# Patient Record
Sex: Female | Born: 1935 | Race: White | Hispanic: No | Smoking: Former smoker
Health system: Southern US, Community
[De-identification: ages and names within clinical notes are randomized; demographics above are authoritative.]

## PROBLEM LIST (undated history)

## (undated) DIAGNOSIS — I6350 Cerebral infarction due to unspecified occlusion or stenosis of unspecified cerebral artery: Secondary | ICD-10-CM

## (undated) DIAGNOSIS — R911 Solitary pulmonary nodule: Secondary | ICD-10-CM

## (undated) DIAGNOSIS — M545 Low back pain, unspecified: Secondary | ICD-10-CM

## (undated) DIAGNOSIS — H3530 Unspecified macular degeneration: Secondary | ICD-10-CM

## (undated) DIAGNOSIS — J852 Abscess of lung without pneumonia: Secondary | ICD-10-CM

## (undated) DIAGNOSIS — M1990 Unspecified osteoarthritis, unspecified site: Secondary | ICD-10-CM

## (undated) DIAGNOSIS — K5730 Diverticulosis of large intestine without perforation or abscess without bleeding: Secondary | ICD-10-CM

## (undated) DIAGNOSIS — E039 Hypothyroidism, unspecified: Secondary | ICD-10-CM

## (undated) DIAGNOSIS — M461 Sacroiliitis, not elsewhere classified: Principal | ICD-10-CM

## (undated) DIAGNOSIS — B54 Unspecified malaria: Secondary | ICD-10-CM

## (undated) DIAGNOSIS — J189 Pneumonia, unspecified organism: Secondary | ICD-10-CM

## (undated) DIAGNOSIS — I10 Essential (primary) hypertension: Secondary | ICD-10-CM

## (undated) DIAGNOSIS — I350 Nonrheumatic aortic (valve) stenosis: Secondary | ICD-10-CM

## (undated) DIAGNOSIS — K315 Obstruction of duodenum: Secondary | ICD-10-CM

## (undated) DIAGNOSIS — F0390 Unspecified dementia without behavioral disturbance: Secondary | ICD-10-CM

## (undated) DIAGNOSIS — K630 Abscess of intestine: Secondary | ICD-10-CM

## (undated) DIAGNOSIS — D649 Anemia, unspecified: Secondary | ICD-10-CM

## (undated) DIAGNOSIS — R740 Nonspecific elevation of levels of transaminase and lactic acid dehydrogenase [LDH]: Secondary | ICD-10-CM

## (undated) DIAGNOSIS — F329 Major depressive disorder, single episode, unspecified: Secondary | ICD-10-CM

## (undated) DIAGNOSIS — R569 Unspecified convulsions: Secondary | ICD-10-CM

## (undated) DIAGNOSIS — K449 Diaphragmatic hernia without obstruction or gangrene: Secondary | ICD-10-CM

## (undated) DIAGNOSIS — I499 Cardiac arrhythmia, unspecified: Secondary | ICD-10-CM

## (undated) DIAGNOSIS — Z7901 Long term (current) use of anticoagulants: Secondary | ICD-10-CM

## (undated) DIAGNOSIS — K625 Hemorrhage of anus and rectum: Secondary | ICD-10-CM

## (undated) DIAGNOSIS — I509 Heart failure, unspecified: Secondary | ICD-10-CM

## (undated) DIAGNOSIS — I6529 Occlusion and stenosis of unspecified carotid artery: Secondary | ICD-10-CM

## (undated) DIAGNOSIS — F419 Anxiety disorder, unspecified: Secondary | ICD-10-CM

## (undated) DIAGNOSIS — J310 Chronic rhinitis: Secondary | ICD-10-CM

## (undated) DIAGNOSIS — D509 Iron deficiency anemia, unspecified: Secondary | ICD-10-CM

## (undated) DIAGNOSIS — K219 Gastro-esophageal reflux disease without esophagitis: Secondary | ICD-10-CM

## (undated) DIAGNOSIS — R202 Paresthesia of skin: Secondary | ICD-10-CM

## (undated) DIAGNOSIS — J449 Chronic obstructive pulmonary disease, unspecified: Secondary | ICD-10-CM

## (undated) DIAGNOSIS — M109 Gout, unspecified: Secondary | ICD-10-CM

## (undated) DIAGNOSIS — D249 Benign neoplasm of unspecified breast: Secondary | ICD-10-CM

## (undated) DIAGNOSIS — IMO0002 Reserved for concepts with insufficient information to code with codable children: Secondary | ICD-10-CM

## (undated) DIAGNOSIS — G8929 Other chronic pain: Secondary | ICD-10-CM

## (undated) DIAGNOSIS — N289 Disorder of kidney and ureter, unspecified: Secondary | ICD-10-CM

## (undated) DIAGNOSIS — K261 Acute duodenal ulcer with perforation: Secondary | ICD-10-CM

## (undated) DIAGNOSIS — C4490 Unspecified malignant neoplasm of skin, unspecified: Secondary | ICD-10-CM

## (undated) DIAGNOSIS — G629 Polyneuropathy, unspecified: Secondary | ICD-10-CM

## (undated) DIAGNOSIS — L408 Other psoriasis: Secondary | ICD-10-CM

## (undated) DIAGNOSIS — M47817 Spondylosis without myelopathy or radiculopathy, lumbosacral region: Secondary | ICD-10-CM

## (undated) DIAGNOSIS — I776 Arteritis, unspecified: Secondary | ICD-10-CM

## (undated) DIAGNOSIS — K8020 Calculus of gallbladder without cholecystitis without obstruction: Secondary | ICD-10-CM

## (undated) DIAGNOSIS — H543 Unqualified visual loss, both eyes: Secondary | ICD-10-CM

## (undated) DIAGNOSIS — F32A Depression, unspecified: Secondary | ICD-10-CM

## (undated) DIAGNOSIS — R7401 Elevation of levels of liver transaminase levels: Secondary | ICD-10-CM

## (undated) DIAGNOSIS — E785 Hyperlipidemia, unspecified: Secondary | ICD-10-CM

## (undated) DIAGNOSIS — K648 Other hemorrhoids: Secondary | ICD-10-CM

## (undated) DIAGNOSIS — Z8673 Personal history of transient ischemic attack (TIA), and cerebral infarction without residual deficits: Secondary | ICD-10-CM

## (undated) DIAGNOSIS — A0472 Enterocolitis due to Clostridium difficile, not specified as recurrent: Secondary | ICD-10-CM

## (undated) HISTORY — DX: Other psoriasis: L40.8

## (undated) HISTORY — DX: Low back pain: M54.5

## (undated) HISTORY — DX: Hypothyroidism, unspecified: E03.9

## (undated) HISTORY — DX: Diaphragmatic hernia without obstruction or gangrene: K44.9

## (undated) HISTORY — DX: Calculus of gallbladder without cholecystitis without obstruction: K80.20

## (undated) HISTORY — DX: Depression, unspecified: F32.A

## (undated) HISTORY — DX: Gout, unspecified: M10.9

## (undated) HISTORY — DX: Low back pain, unspecified: M54.50

## (undated) HISTORY — DX: Nonspecific elevation of levels of transaminase and lactic acid dehydrogenase (ldh): R74.0

## (undated) HISTORY — DX: Spondylosis without myelopathy or radiculopathy, lumbosacral region: M47.817

## (undated) HISTORY — DX: Hyperlipidemia, unspecified: E78.5

## (undated) HISTORY — DX: Unspecified dementia, unspecified severity, without behavioral disturbance, psychotic disturbance, mood disturbance, and anxiety: F03.90

## (undated) HISTORY — DX: Abscess of lung without pneumonia: J85.2

## (undated) HISTORY — DX: Major depressive disorder, single episode, unspecified: F32.9

## (undated) HISTORY — DX: Chronic rhinitis: J31.0

## (undated) HISTORY — DX: Acute duodenal ulcer with perforation: K26.1

## (undated) HISTORY — DX: Abscess of intestine: K63.0

## (undated) HISTORY — DX: Diverticulosis of large intestine without perforation or abscess without bleeding: K57.30

## (undated) HISTORY — DX: Heart failure, unspecified: I50.9

## (undated) HISTORY — DX: Elevation of levels of liver transaminase levels: R74.01

## (undated) HISTORY — DX: Essential (primary) hypertension: I10

## (undated) HISTORY — PX: BREAST LUMPECTOMY: SHX2

## (undated) HISTORY — PX: CATARACT EXTRACTION W/ INTRAOCULAR LENS  IMPLANT, BILATERAL: SHX1307

## (undated) HISTORY — DX: Unspecified osteoarthritis, unspecified site: M19.90

## (undated) HISTORY — DX: Personal history of transient ischemic attack (TIA), and cerebral infarction without residual deficits: Z86.73

## (undated) HISTORY — DX: Reserved for concepts with insufficient information to code with codable children: IMO0002

## (undated) HISTORY — DX: Iron deficiency anemia, unspecified: D50.9

## (undated) HISTORY — PX: TONSILLECTOMY: SUR1361

## (undated) HISTORY — PX: SKIN CANCER EXCISION: SHX779

## (undated) HISTORY — DX: Enterocolitis due to Clostridium difficile, not specified as recurrent: A04.72

## (undated) HISTORY — PX: VESICOVAGINAL FISTULA CLOSURE W/ TAH: SUR271

## (undated) HISTORY — DX: Chronic obstructive pulmonary disease, unspecified: J44.9

## (undated) HISTORY — DX: Paresthesia of skin: R20.2

## (undated) HISTORY — DX: Sacroiliitis, not elsewhere classified: M46.1

## (undated) HISTORY — PX: ABDOMINAL HYSTERECTOMY: SHX81

## (undated) HISTORY — DX: Nonrheumatic aortic (valve) stenosis: I35.0

## (undated) HISTORY — DX: Benign neoplasm of unspecified breast: D24.9

## (undated) HISTORY — DX: Long term (current) use of anticoagulants: Z79.01

## (undated) HISTORY — DX: Hemorrhage of anus and rectum: K62.5

## (undated) HISTORY — DX: Anemia, unspecified: D64.9

## (undated) HISTORY — DX: Anxiety disorder, unspecified: F41.9

## (undated) HISTORY — DX: Obstruction of duodenum: K31.5

## (undated) HISTORY — DX: Solitary pulmonary nodule: R91.1

---

## 1992-11-09 ENCOUNTER — Encounter (INDEPENDENT_AMBULATORY_CARE_PROVIDER_SITE_OTHER): Payer: Self-pay | Admitting: *Deleted

## 1999-03-02 ENCOUNTER — Ambulatory Visit (HOSPITAL_COMMUNITY): Admission: RE | Admit: 1999-03-02 | Discharge: 1999-03-02 | Payer: Self-pay | Admitting: *Deleted

## 1999-04-25 ENCOUNTER — Encounter: Payer: Self-pay | Admitting: General Surgery

## 1999-04-25 ENCOUNTER — Encounter: Admission: RE | Admit: 1999-04-25 | Discharge: 1999-04-25 | Payer: Self-pay | Admitting: General Surgery

## 1999-04-26 ENCOUNTER — Encounter (INDEPENDENT_AMBULATORY_CARE_PROVIDER_SITE_OTHER): Payer: Self-pay | Admitting: Specialist

## 1999-04-26 ENCOUNTER — Ambulatory Visit (HOSPITAL_BASED_OUTPATIENT_CLINIC_OR_DEPARTMENT_OTHER): Admission: RE | Admit: 1999-04-26 | Discharge: 1999-04-26 | Payer: Self-pay | Admitting: General Surgery

## 1999-05-09 ENCOUNTER — Ambulatory Visit (HOSPITAL_BASED_OUTPATIENT_CLINIC_OR_DEPARTMENT_OTHER): Admission: RE | Admit: 1999-05-09 | Discharge: 1999-05-09 | Payer: Self-pay | Admitting: Plastic Surgery

## 1999-11-23 ENCOUNTER — Ambulatory Visit (HOSPITAL_COMMUNITY): Admission: RE | Admit: 1999-11-23 | Discharge: 1999-11-23 | Payer: Self-pay | Admitting: Neurology

## 1999-12-29 ENCOUNTER — Other Ambulatory Visit: Admission: RE | Admit: 1999-12-29 | Discharge: 1999-12-29 | Payer: Self-pay | Admitting: Internal Medicine

## 2000-04-27 ENCOUNTER — Emergency Department (HOSPITAL_COMMUNITY): Admission: EM | Admit: 2000-04-27 | Discharge: 2000-04-27 | Payer: Self-pay | Admitting: Emergency Medicine

## 2000-09-24 ENCOUNTER — Emergency Department (HOSPITAL_COMMUNITY): Admission: EM | Admit: 2000-09-24 | Discharge: 2000-09-25 | Payer: Self-pay | Admitting: Emergency Medicine

## 2000-09-24 ENCOUNTER — Encounter: Payer: Self-pay | Admitting: Emergency Medicine

## 2000-11-21 ENCOUNTER — Inpatient Hospital Stay (HOSPITAL_COMMUNITY): Admission: AD | Admit: 2000-11-21 | Discharge: 2000-11-24 | Payer: Self-pay | Admitting: Cardiology

## 2000-11-22 ENCOUNTER — Encounter: Payer: Self-pay | Admitting: Cardiology

## 2002-12-18 ENCOUNTER — Encounter: Payer: Self-pay | Admitting: Neurology

## 2002-12-18 ENCOUNTER — Encounter: Admission: RE | Admit: 2002-12-18 | Discharge: 2002-12-18 | Payer: Self-pay | Admitting: Neurology

## 2003-12-14 ENCOUNTER — Inpatient Hospital Stay (HOSPITAL_COMMUNITY): Admission: EM | Admit: 2003-12-14 | Discharge: 2003-12-16 | Payer: Self-pay | Admitting: Emergency Medicine

## 2003-12-15 ENCOUNTER — Encounter: Payer: Self-pay | Admitting: Gastroenterology

## 2005-02-20 ENCOUNTER — Ambulatory Visit: Payer: Self-pay | Admitting: Gastroenterology

## 2005-02-26 ENCOUNTER — Ambulatory Visit: Payer: Self-pay | Admitting: Internal Medicine

## 2005-03-07 ENCOUNTER — Encounter: Payer: Self-pay | Admitting: Cardiology

## 2005-03-07 ENCOUNTER — Ambulatory Visit: Payer: Self-pay

## 2005-03-09 ENCOUNTER — Ambulatory Visit: Payer: Self-pay | Admitting: Internal Medicine

## 2005-03-28 ENCOUNTER — Ambulatory Visit: Payer: Self-pay | Admitting: Internal Medicine

## 2005-03-28 ENCOUNTER — Ambulatory Visit: Payer: Self-pay | Admitting: Family Medicine

## 2005-04-05 ENCOUNTER — Ambulatory Visit: Payer: Self-pay | Admitting: Internal Medicine

## 2005-04-20 ENCOUNTER — Ambulatory Visit: Payer: Self-pay | Admitting: Internal Medicine

## 2005-05-18 ENCOUNTER — Ambulatory Visit: Payer: Self-pay | Admitting: Internal Medicine

## 2005-05-25 ENCOUNTER — Ambulatory Visit: Payer: Self-pay | Admitting: Internal Medicine

## 2005-06-18 ENCOUNTER — Ambulatory Visit: Payer: Self-pay | Admitting: Internal Medicine

## 2005-06-28 ENCOUNTER — Ambulatory Visit: Payer: Self-pay | Admitting: Internal Medicine

## 2005-07-05 ENCOUNTER — Ambulatory Visit: Payer: Self-pay | Admitting: Internal Medicine

## 2005-07-10 ENCOUNTER — Encounter: Admission: RE | Admit: 2005-07-10 | Discharge: 2005-08-12 | Payer: Self-pay | Admitting: Neurology

## 2005-07-13 ENCOUNTER — Ambulatory Visit: Payer: Self-pay | Admitting: Internal Medicine

## 2005-07-20 ENCOUNTER — Ambulatory Visit: Payer: Self-pay | Admitting: Internal Medicine

## 2005-08-02 ENCOUNTER — Ambulatory Visit: Payer: Self-pay | Admitting: Internal Medicine

## 2005-08-09 ENCOUNTER — Ambulatory Visit: Payer: Self-pay | Admitting: Internal Medicine

## 2005-08-13 ENCOUNTER — Encounter: Admission: RE | Admit: 2005-08-13 | Discharge: 2005-08-23 | Payer: Self-pay | Admitting: Neurology

## 2005-09-04 ENCOUNTER — Ambulatory Visit: Payer: Self-pay | Admitting: Internal Medicine

## 2005-09-19 ENCOUNTER — Ambulatory Visit: Payer: Self-pay | Admitting: Internal Medicine

## 2005-10-02 ENCOUNTER — Ambulatory Visit: Payer: Self-pay | Admitting: Internal Medicine

## 2005-10-25 ENCOUNTER — Ambulatory Visit: Payer: Self-pay | Admitting: Internal Medicine

## 2005-10-31 ENCOUNTER — Ambulatory Visit: Payer: Self-pay | Admitting: Internal Medicine

## 2005-11-13 ENCOUNTER — Ambulatory Visit: Payer: Self-pay | Admitting: Internal Medicine

## 2005-11-22 ENCOUNTER — Ambulatory Visit: Payer: Self-pay | Admitting: Internal Medicine

## 2005-11-23 ENCOUNTER — Ambulatory Visit: Payer: Self-pay | Admitting: Internal Medicine

## 2005-11-28 ENCOUNTER — Ambulatory Visit: Payer: Self-pay | Admitting: Internal Medicine

## 2005-12-05 ENCOUNTER — Ambulatory Visit: Payer: Self-pay | Admitting: Internal Medicine

## 2005-12-12 ENCOUNTER — Ambulatory Visit: Payer: Self-pay | Admitting: Internal Medicine

## 2005-12-18 ENCOUNTER — Ambulatory Visit: Payer: Self-pay

## 2006-01-08 ENCOUNTER — Ambulatory Visit: Payer: Self-pay | Admitting: Internal Medicine

## 2006-01-09 ENCOUNTER — Ambulatory Visit: Payer: Self-pay | Admitting: Internal Medicine

## 2006-01-23 ENCOUNTER — Ambulatory Visit: Payer: Self-pay | Admitting: Internal Medicine

## 2006-01-29 ENCOUNTER — Ambulatory Visit: Payer: Self-pay

## 2006-02-04 ENCOUNTER — Ambulatory Visit: Payer: Self-pay | Admitting: Internal Medicine

## 2006-02-05 ENCOUNTER — Ambulatory Visit: Payer: Self-pay | Admitting: Gastroenterology

## 2006-02-05 LAB — CONVERTED CEMR LAB
ALT: 32 units/L (ref 0–40)
AST: 41 units/L — ABNORMAL HIGH (ref 0–37)
Alkaline Phosphatase: 64 units/L (ref 39–117)
Basophils Absolute: 0.1 10*3/uL (ref 0.0–0.1)
Chloride: 103 meq/L (ref 96–112)
Glucose, Bld: 92 mg/dL (ref 70–99)
HCT: 40.9 % (ref 36.0–46.0)
Lymphocytes Relative: 34.5 % (ref 12.0–46.0)
MCHC: 32.3 g/dL (ref 30.0–36.0)
MCV: 92 fL (ref 78.0–100.0)
Monocytes Relative: 8 % (ref 3.0–11.0)
Neutro Abs: 3.9 10*3/uL (ref 1.4–7.7)
Neutrophils Relative %: 54 % (ref 43.0–77.0)
Saturation Ratios: 9.6 % — ABNORMAL LOW (ref 20.0–50.0)
Sodium: 139 meq/L (ref 135–145)
TSH: 1.23 microintl units/mL (ref 0.35–5.50)
Total Protein: 7.5 g/dL (ref 6.0–8.3)
Transferrin: 363.9 mg/dL — ABNORMAL HIGH (ref >=212.0)
Vitamin B-12: >1500 pg/mL — ABNORMAL HIGH (ref 211–911)
WBC: 7.3 10*3/uL (ref 4.5–10.5)

## 2006-02-13 ENCOUNTER — Ambulatory Visit: Payer: Self-pay | Admitting: Internal Medicine

## 2006-02-28 ENCOUNTER — Ambulatory Visit: Payer: Self-pay | Admitting: Gastroenterology

## 2006-03-12 ENCOUNTER — Ambulatory Visit: Payer: Self-pay | Admitting: Internal Medicine

## 2006-03-27 ENCOUNTER — Ambulatory Visit: Payer: Self-pay | Admitting: Gastroenterology

## 2006-04-09 ENCOUNTER — Ambulatory Visit: Payer: Self-pay | Admitting: Internal Medicine

## 2006-04-09 LAB — CONVERTED CEMR LAB
ALT: 30 units/L (ref 0–40)
AST: 42 units/L — ABNORMAL HIGH (ref 0–37)
Albumin: 3.6 g/dL (ref 3.5–5.2)
Alkaline Phosphatase: 61 units/L (ref 39–117)
BUN: 18 mg/dL (ref 6–23)
Bilirubin, Direct: 0.1 mg/dL (ref 0.0–0.3)
CO2: 27 meq/L (ref 19–32)
Calcium: 9.1 mg/dL (ref 8.4–10.5)
Chloride: 104 meq/L (ref 96–112)
Cholesterol: 219 mg/dL (ref 0–200)
Creatinine, Ser: 1.1 mg/dL (ref 0.4–1.2)
Direct LDL: 142.9 mg/dL
GFR calc Af Amer: 61 mL/min
GFR calc non Af Amer: 50 mL/min
Glucose, Bld: 80 mg/dL (ref 70–99)
HDL: 39.8 mg/dL (ref >39.0)
Potassium: 4.8 meq/L (ref 3.5–5.1)
Sodium: 138 meq/L (ref 135–145)
Total Bilirubin: 1.2 mg/dL (ref 0.3–1.2)
Total CHOL/HDL Ratio: 5.5
Total Protein: 6.7 g/dL (ref 6.0–8.3)
Triglycerides: 230 mg/dL (ref 0–149)
VLDL: 46 mg/dL — ABNORMAL HIGH (ref 0–40)

## 2006-04-17 ENCOUNTER — Encounter: Admission: RE | Admit: 2006-04-17 | Discharge: 2006-04-17 | Payer: Self-pay | Admitting: Internal Medicine

## 2006-05-14 ENCOUNTER — Ambulatory Visit: Payer: Self-pay | Admitting: Internal Medicine

## 2006-05-21 ENCOUNTER — Ambulatory Visit: Payer: Self-pay | Admitting: Internal Medicine

## 2006-05-27 ENCOUNTER — Ambulatory Visit: Payer: Self-pay | Admitting: Internal Medicine

## 2006-05-27 ENCOUNTER — Encounter: Admission: RE | Admit: 2006-05-27 | Discharge: 2006-05-27 | Payer: Self-pay | Admitting: Internal Medicine

## 2006-06-03 ENCOUNTER — Ambulatory Visit: Payer: Self-pay | Admitting: Internal Medicine

## 2006-06-10 ENCOUNTER — Ambulatory Visit: Payer: Self-pay | Admitting: Internal Medicine

## 2006-06-10 ENCOUNTER — Encounter: Admission: RE | Admit: 2006-06-10 | Discharge: 2006-06-10 | Payer: Self-pay | Admitting: Internal Medicine

## 2006-06-11 ENCOUNTER — Encounter: Payer: Self-pay | Admitting: Internal Medicine

## 2006-06-24 ENCOUNTER — Ambulatory Visit: Payer: Self-pay | Admitting: Internal Medicine

## 2006-06-26 ENCOUNTER — Ambulatory Visit: Payer: Self-pay | Admitting: Internal Medicine

## 2006-07-08 ENCOUNTER — Ambulatory Visit: Payer: Self-pay | Admitting: Internal Medicine

## 2006-07-22 ENCOUNTER — Ambulatory Visit: Payer: Self-pay | Admitting: Internal Medicine

## 2006-07-22 LAB — CONVERTED CEMR LAB
ALT: 26 units/L (ref 0–40)
AST: 35 units/L (ref 0–37)
Albumin: 3.2 g/dL — ABNORMAL LOW (ref 3.5–5.2)
Alkaline Phosphatase: 65 units/L (ref 39–117)
Bilirubin, Direct: 0.1 mg/dL (ref 0.0–0.3)
Cholesterol: 234 mg/dL (ref 0–200)
Direct LDL: 147.6 mg/dL
HDL: 37.5 mg/dL — ABNORMAL LOW (ref >39.0)
Total Bilirubin: 0.8 mg/dL (ref 0.3–1.2)
Total CHOL/HDL Ratio: 6.2
Total Protein: 7.2 g/dL (ref 6.0–8.3)
Triglycerides: 280 mg/dL (ref 0–149)
VLDL: 56 mg/dL — ABNORMAL HIGH (ref 0–40)

## 2006-08-20 ENCOUNTER — Ambulatory Visit: Payer: Self-pay | Admitting: Internal Medicine

## 2006-08-27 ENCOUNTER — Ambulatory Visit: Payer: Self-pay | Admitting: Internal Medicine

## 2006-09-11 ENCOUNTER — Ambulatory Visit: Payer: Self-pay | Admitting: Internal Medicine

## 2006-09-13 ENCOUNTER — Ambulatory Visit: Payer: Self-pay | Admitting: Pulmonary Disease

## 2006-09-13 ENCOUNTER — Inpatient Hospital Stay (HOSPITAL_COMMUNITY): Admission: EM | Admit: 2006-09-13 | Discharge: 2006-09-20 | Payer: Self-pay | Admitting: Emergency Medicine

## 2006-09-13 DIAGNOSIS — I509 Heart failure, unspecified: Secondary | ICD-10-CM | POA: Insufficient documentation

## 2006-09-13 DIAGNOSIS — I10 Essential (primary) hypertension: Secondary | ICD-10-CM | POA: Insufficient documentation

## 2006-09-13 DIAGNOSIS — E039 Hypothyroidism, unspecified: Secondary | ICD-10-CM | POA: Insufficient documentation

## 2006-09-13 DIAGNOSIS — I6529 Occlusion and stenosis of unspecified carotid artery: Secondary | ICD-10-CM

## 2006-09-14 ENCOUNTER — Ambulatory Visit: Payer: Self-pay | Admitting: Internal Medicine

## 2006-09-20 ENCOUNTER — Encounter: Payer: Self-pay | Admitting: Pulmonary Disease

## 2006-09-24 ENCOUNTER — Ambulatory Visit: Payer: Self-pay | Admitting: Internal Medicine

## 2006-09-24 LAB — CONVERTED CEMR LAB
ALT: 22 units/L (ref 0–35)
AST: 32 units/L (ref 0–37)
Albumin: 2.9 g/dL — ABNORMAL LOW (ref 3.5–5.2)
Alkaline Phosphatase: 68 units/L (ref 39–117)
BUN: 13 mg/dL (ref 6–23)
Basophils Absolute: 0.1 10*3/uL (ref 0.0–0.1)
Basophils Relative: 0.8 % (ref 0.0–1.0)
Bilirubin, Direct: 0.1 mg/dL (ref 0.0–0.3)
CO2: 31 meq/L (ref 19–32)
Calcium: 9 mg/dL (ref 8.4–10.5)
Chloride: 101 meq/L (ref 96–112)
Creatinine, Ser: 1.1 mg/dL (ref 0.4–1.2)
Eosinophils Absolute: 0.2 10*3/uL (ref 0.0–0.6)
Eosinophils Relative: 2.2 % (ref 0.0–5.0)
GFR calc Af Amer: 61 mL/min
GFR calc non Af Amer: 50 mL/min
Glucose, Bld: 63 mg/dL — ABNORMAL LOW (ref 70–99)
HCT: 37.5 % (ref 36.0–46.0)
Hemoglobin: 12.3 g/dL (ref 12.0–15.0)
Lymphocytes Relative: 23.6 % (ref 12.0–46.0)
MCHC: 32.9 g/dL (ref 30.0–36.0)
MCV: 91.1 fL (ref 78.0–100.0)
Monocytes Absolute: 0.8 10*3/uL — ABNORMAL HIGH (ref 0.2–0.7)
Monocytes Relative: 8.3 % (ref 3.0–11.0)
Neutro Abs: 6.4 10*3/uL (ref 1.4–7.7)
Neutrophils Relative %: 65.1 % (ref 43.0–77.0)
Platelets: 560 10*3/uL — ABNORMAL HIGH (ref 150–400)
Potassium: 4.2 meq/L (ref 3.5–5.1)
RBC: 4.12 M/uL (ref 3.87–5.11)
RDW: 15.2 % — ABNORMAL HIGH (ref 11.5–14.6)
Sodium: 143 meq/L (ref 135–145)
Total Bilirubin: 0.7 mg/dL (ref 0.3–1.2)
Total Protein: 6.9 g/dL (ref 6.0–8.3)
WBC: 9.8 10*3/uL (ref 4.5–10.5)

## 2006-10-01 ENCOUNTER — Telehealth: Payer: Self-pay | Admitting: Internal Medicine

## 2006-10-08 ENCOUNTER — Ambulatory Visit: Payer: Self-pay | Admitting: Internal Medicine

## 2006-10-16 ENCOUNTER — Telehealth (INDEPENDENT_AMBULATORY_CARE_PROVIDER_SITE_OTHER): Payer: Self-pay | Admitting: *Deleted

## 2006-10-18 ENCOUNTER — Ambulatory Visit: Payer: Self-pay | Admitting: Internal Medicine

## 2006-10-25 ENCOUNTER — Ambulatory Visit: Payer: Self-pay | Admitting: Internal Medicine

## 2006-10-25 LAB — CONVERTED CEMR LAB
INR: 6.6
Prothrombin Time: 31.1 s

## 2006-11-05 DIAGNOSIS — R569 Unspecified convulsions: Secondary | ICD-10-CM | POA: Insufficient documentation

## 2006-11-05 DIAGNOSIS — Z8719 Personal history of other diseases of the digestive system: Secondary | ICD-10-CM

## 2006-11-05 DIAGNOSIS — K219 Gastro-esophageal reflux disease without esophagitis: Secondary | ICD-10-CM

## 2006-11-05 HISTORY — DX: Unspecified convulsions: R56.9

## 2006-11-05 HISTORY — DX: Gastro-esophageal reflux disease without esophagitis: K21.9

## 2006-11-11 ENCOUNTER — Ambulatory Visit: Payer: Self-pay | Admitting: Internal Medicine

## 2006-11-11 LAB — CONVERTED CEMR LAB
INR: 2.5
Prothrombin Time: 19.2 s

## 2006-11-18 ENCOUNTER — Ambulatory Visit: Payer: Self-pay | Admitting: Pulmonary Disease

## 2006-12-13 ENCOUNTER — Ambulatory Visit: Payer: Self-pay | Admitting: Internal Medicine

## 2006-12-13 LAB — CONVERTED CEMR LAB
INR: 1.9
Prothrombin Time: 16.8 s

## 2006-12-23 ENCOUNTER — Telehealth: Payer: Self-pay | Admitting: Internal Medicine

## 2006-12-24 ENCOUNTER — Telehealth (INDEPENDENT_AMBULATORY_CARE_PROVIDER_SITE_OTHER): Payer: Self-pay | Admitting: *Deleted

## 2007-01-10 ENCOUNTER — Ambulatory Visit: Payer: Self-pay | Admitting: Internal Medicine

## 2007-02-07 ENCOUNTER — Ambulatory Visit: Payer: Self-pay | Admitting: Internal Medicine

## 2007-02-07 DIAGNOSIS — I6350 Cerebral infarction due to unspecified occlusion or stenosis of unspecified cerebral artery: Secondary | ICD-10-CM | POA: Insufficient documentation

## 2007-02-07 HISTORY — DX: Cerebral infarction due to unspecified occlusion or stenosis of unspecified cerebral artery: I63.50

## 2007-02-07 LAB — CONVERTED CEMR LAB
ALT: 32 units/L (ref 0–35)
AST: 46 units/L — ABNORMAL HIGH (ref 0–37)
Albumin: 3.8 g/dL (ref 3.5–5.2)
Alkaline Phosphatase: 64 units/L (ref 39–117)
BUN: 13 mg/dL (ref 6–23)
Basophils Absolute: 0 10*3/uL (ref 0.0–0.1)
Basophils Relative: 0.3 % (ref 0.0–1.0)
Bilirubin Urine: NEGATIVE
Bilirubin, Direct: 0.1 mg/dL (ref 0.0–0.3)
CO2: 28 meq/L (ref 19–32)
Calcium: 9.6 mg/dL (ref 8.4–10.5)
Chloride: 103 meq/L (ref 96–112)
Cholesterol: 231 mg/dL (ref 0–200)
Creatinine, Ser: 1 mg/dL (ref 0.4–1.2)
Direct LDL: 156.9 mg/dL
Eosinophils Absolute: 0.1 10*3/uL (ref 0.0–0.6)
Eosinophils Relative: 2.1 % (ref 0.0–5.0)
GFR calc Af Amer: 68 mL/min
GFR calc non Af Amer: 56 mL/min
Glucose, Bld: 103 mg/dL — ABNORMAL HIGH (ref 70–99)
Glucose, Urine, Semiquant: NEGATIVE
HCT: 39.5 % (ref 36.0–46.0)
HDL: 39.2 mg/dL (ref >39.0)
Hemoglobin: 13.4 g/dL (ref 12.0–15.0)
INR: 2.3
Ketones, urine, test strip: NEGATIVE
Lymphocytes Relative: 38.6 % (ref 12.0–46.0)
MCHC: 34 g/dL (ref 30.0–36.0)
MCV: 83.5 fL (ref 78.0–100.0)
Monocytes Absolute: 0.6 10*3/uL (ref 0.2–0.7)
Monocytes Relative: 9.8 % (ref 3.0–11.0)
Neutro Abs: 3.2 10*3/uL (ref 1.4–7.7)
Neutrophils Relative %: 49.2 % (ref 43.0–77.0)
Nitrite: NEGATIVE
Platelets: 257 10*3/uL (ref 150–400)
Potassium: 4.6 meq/L (ref 3.5–5.1)
Prothrombin Time: 18.4 s
RBC: 4.73 M/uL (ref 3.87–5.11)
RDW: 15.3 % — ABNORMAL HIGH (ref 11.5–14.6)
Sodium: 140 meq/L (ref 135–145)
Specific Gravity, Urine: 1.025
Total Bilirubin: 0.8 mg/dL (ref 0.3–1.2)
Total CHOL/HDL Ratio: 5.9
Total Protein: 7.3 g/dL (ref 6.0–8.3)
Triglycerides: 255 mg/dL (ref 0–149)
Urobilinogen, UA: 0.2
VLDL: 51 mg/dL — ABNORMAL HIGH (ref 0–40)
WBC Urine, dipstick: NEGATIVE
WBC: 6.4 10*3/uL (ref 4.5–10.5)
pH: 5

## 2007-02-17 ENCOUNTER — Telehealth: Payer: Self-pay | Admitting: Internal Medicine

## 2007-02-24 ENCOUNTER — Ambulatory Visit: Payer: Self-pay | Admitting: Pulmonary Disease

## 2007-02-25 ENCOUNTER — Ambulatory Visit: Payer: Self-pay | Admitting: Internal Medicine

## 2007-02-25 LAB — CONVERTED CEMR LAB
Glucose, Urine, Semiquant: NEGATIVE
INR: 2.6
Nitrite: NEGATIVE
Prothrombin Time: 19.7 s
Specific Gravity, Urine: 1.025
Urobilinogen, UA: 0.2
WBC Urine, dipstick: NEGATIVE
pH: 5

## 2007-02-26 ENCOUNTER — Encounter: Payer: Self-pay | Admitting: Internal Medicine

## 2007-03-11 ENCOUNTER — Telehealth: Payer: Self-pay | Admitting: Internal Medicine

## 2007-03-31 ENCOUNTER — Ambulatory Visit: Payer: Self-pay | Admitting: Internal Medicine

## 2007-03-31 LAB — CONVERTED CEMR LAB: Prothrombin Time: 16.8 s

## 2007-04-09 ENCOUNTER — Ambulatory Visit: Payer: Self-pay | Admitting: Pulmonary Disease

## 2007-04-09 DIAGNOSIS — J310 Chronic rhinitis: Secondary | ICD-10-CM | POA: Insufficient documentation

## 2007-04-09 DIAGNOSIS — J449 Chronic obstructive pulmonary disease, unspecified: Secondary | ICD-10-CM

## 2007-04-11 ENCOUNTER — Telehealth: Payer: Self-pay | Admitting: Internal Medicine

## 2007-04-15 ENCOUNTER — Encounter: Payer: Self-pay | Admitting: Internal Medicine

## 2007-04-29 ENCOUNTER — Ambulatory Visit: Payer: Self-pay | Admitting: Internal Medicine

## 2007-05-28 ENCOUNTER — Ambulatory Visit: Payer: Self-pay | Admitting: Internal Medicine

## 2007-05-28 LAB — CONVERTED CEMR LAB
INR: 1.5
Prothrombin Time: 15.1 s

## 2007-05-29 ENCOUNTER — Encounter: Payer: Self-pay | Admitting: Internal Medicine

## 2007-06-04 ENCOUNTER — Telehealth: Payer: Self-pay | Admitting: Internal Medicine

## 2007-06-10 ENCOUNTER — Encounter: Admission: RE | Admit: 2007-06-10 | Discharge: 2007-06-10 | Payer: Self-pay | Admitting: Neurology

## 2007-06-19 ENCOUNTER — Encounter: Payer: Self-pay | Admitting: Internal Medicine

## 2007-06-19 ENCOUNTER — Telehealth: Payer: Self-pay | Admitting: Internal Medicine

## 2007-06-30 ENCOUNTER — Ambulatory Visit: Payer: Self-pay | Admitting: Internal Medicine

## 2007-06-30 LAB — CONVERTED CEMR LAB: INR: 3.2

## 2007-07-02 ENCOUNTER — Encounter: Admission: RE | Admit: 2007-07-02 | Discharge: 2007-07-02 | Payer: Self-pay | Admitting: Ophthalmology

## 2007-07-09 ENCOUNTER — Ambulatory Visit: Payer: Self-pay | Admitting: Pulmonary Disease

## 2007-07-15 ENCOUNTER — Encounter: Payer: Self-pay | Admitting: Internal Medicine

## 2007-07-30 ENCOUNTER — Ambulatory Visit: Payer: Self-pay | Admitting: Internal Medicine

## 2007-07-30 LAB — CONVERTED CEMR LAB: INR: 2.3

## 2007-08-27 ENCOUNTER — Encounter: Payer: Self-pay | Admitting: Internal Medicine

## 2007-08-27 ENCOUNTER — Ambulatory Visit: Payer: Self-pay | Admitting: Internal Medicine

## 2007-08-27 DIAGNOSIS — E785 Hyperlipidemia, unspecified: Secondary | ICD-10-CM

## 2007-08-28 LAB — CONVERTED CEMR LAB
ALT: 23 units/L (ref 0–35)
Alkaline Phosphatase: 73 units/L (ref 39–117)
Bilirubin, Direct: 0.1 mg/dL (ref 0.0–0.3)
HDL: 44.6 mg/dL (ref >39.0)
TSH: 3.26 microintl units/mL (ref 0.35–5.50)
Total Bilirubin: 0.7 mg/dL (ref 0.3–1.2)
VLDL: 38 mg/dL (ref 0–40)

## 2007-09-08 ENCOUNTER — Telehealth: Payer: Self-pay | Admitting: Internal Medicine

## 2007-09-15 ENCOUNTER — Ambulatory Visit: Payer: Self-pay | Admitting: Internal Medicine

## 2007-09-15 ENCOUNTER — Telehealth: Payer: Self-pay | Admitting: Internal Medicine

## 2007-09-15 DIAGNOSIS — M549 Dorsalgia, unspecified: Secondary | ICD-10-CM | POA: Insufficient documentation

## 2007-09-15 LAB — CONVERTED CEMR LAB
Albumin: 3.2 g/dL — ABNORMAL LOW (ref 3.5–5.2)
BUN: 12 mg/dL (ref 6–23)
Calcium: 9 mg/dL (ref 8.4–10.5)
Creatinine, Ser: 1.1 mg/dL (ref 0.4–1.2)
Eosinophils Relative: 0 % (ref 0.0–5.0)
GFR calc Af Amer: 61 mL/min
Glucose, Bld: 126 mg/dL — ABNORMAL HIGH (ref 70–99)
HCT: 39.1 % (ref 36.0–46.0)
Hemoglobin: 12.9 g/dL (ref 12.0–15.0)
Monocytes Absolute: 1.1 10*3/uL — ABNORMAL HIGH (ref 0.1–1.0)
Monocytes Relative: 6.7 % (ref 3.0–12.0)
Neutro Abs: 13.6 10*3/uL — ABNORMAL HIGH (ref 1.4–7.7)
Total Protein: 8.6 g/dL — ABNORMAL HIGH (ref 6.0–8.3)
WBC: 16.3 10*3/uL — ABNORMAL HIGH (ref 4.5–10.5)

## 2007-09-16 ENCOUNTER — Telehealth: Payer: Self-pay | Admitting: Internal Medicine

## 2007-09-16 ENCOUNTER — Encounter: Payer: Self-pay | Admitting: Internal Medicine

## 2007-09-17 ENCOUNTER — Telehealth: Payer: Self-pay | Admitting: Internal Medicine

## 2007-09-18 ENCOUNTER — Ambulatory Visit: Payer: Self-pay | Admitting: Internal Medicine

## 2007-09-18 ENCOUNTER — Inpatient Hospital Stay (HOSPITAL_COMMUNITY): Admission: EM | Admit: 2007-09-18 | Discharge: 2007-09-22 | Payer: Self-pay | Admitting: Emergency Medicine

## 2007-09-19 ENCOUNTER — Encounter (INDEPENDENT_AMBULATORY_CARE_PROVIDER_SITE_OTHER): Payer: Self-pay | Admitting: *Deleted

## 2007-09-22 ENCOUNTER — Encounter: Payer: Self-pay | Admitting: Internal Medicine

## 2007-09-22 ENCOUNTER — Telehealth: Payer: Self-pay | Admitting: Family Medicine

## 2007-09-29 ENCOUNTER — Ambulatory Visit: Payer: Self-pay | Admitting: Pulmonary Disease

## 2007-09-30 ENCOUNTER — Telehealth (INDEPENDENT_AMBULATORY_CARE_PROVIDER_SITE_OTHER): Payer: Self-pay | Admitting: *Deleted

## 2007-10-01 ENCOUNTER — Ambulatory Visit: Payer: Self-pay | Admitting: Internal Medicine

## 2007-10-01 LAB — CONVERTED CEMR LAB
INR: 4.3
Prothrombin Time: 25.2 s

## 2007-10-29 ENCOUNTER — Ambulatory Visit: Payer: Self-pay | Admitting: Internal Medicine

## 2007-10-29 LAB — CONVERTED CEMR LAB
INR: 3.6
Prothrombin Time: 23 s

## 2007-10-30 ENCOUNTER — Encounter: Payer: Self-pay | Admitting: Internal Medicine

## 2007-10-31 ENCOUNTER — Ambulatory Visit: Payer: Self-pay | Admitting: Pulmonary Disease

## 2007-11-07 ENCOUNTER — Telehealth (INDEPENDENT_AMBULATORY_CARE_PROVIDER_SITE_OTHER): Payer: Self-pay | Admitting: *Deleted

## 2007-11-10 ENCOUNTER — Encounter: Payer: Self-pay | Admitting: Internal Medicine

## 2007-11-18 ENCOUNTER — Encounter: Payer: Self-pay | Admitting: Internal Medicine

## 2007-12-02 ENCOUNTER — Ambulatory Visit: Payer: Self-pay | Admitting: Internal Medicine

## 2007-12-08 ENCOUNTER — Ambulatory Visit: Payer: Self-pay | Admitting: Pulmonary Disease

## 2007-12-10 ENCOUNTER — Telehealth (INDEPENDENT_AMBULATORY_CARE_PROVIDER_SITE_OTHER): Payer: Self-pay | Admitting: *Deleted

## 2007-12-16 ENCOUNTER — Encounter: Payer: Self-pay | Admitting: Pulmonary Disease

## 2008-03-20 IMAGING — CR DG CHEST 2V
2 series · 2 of 2 positions shown · non-contrast
Comparison: Portable chest 09/20/06 and two view chest 09/19/06.

CLINICAL DATA: Follow-up pneumonia.
 CHEST - 2 VIEW:

[view not recorded (1 of 2)]
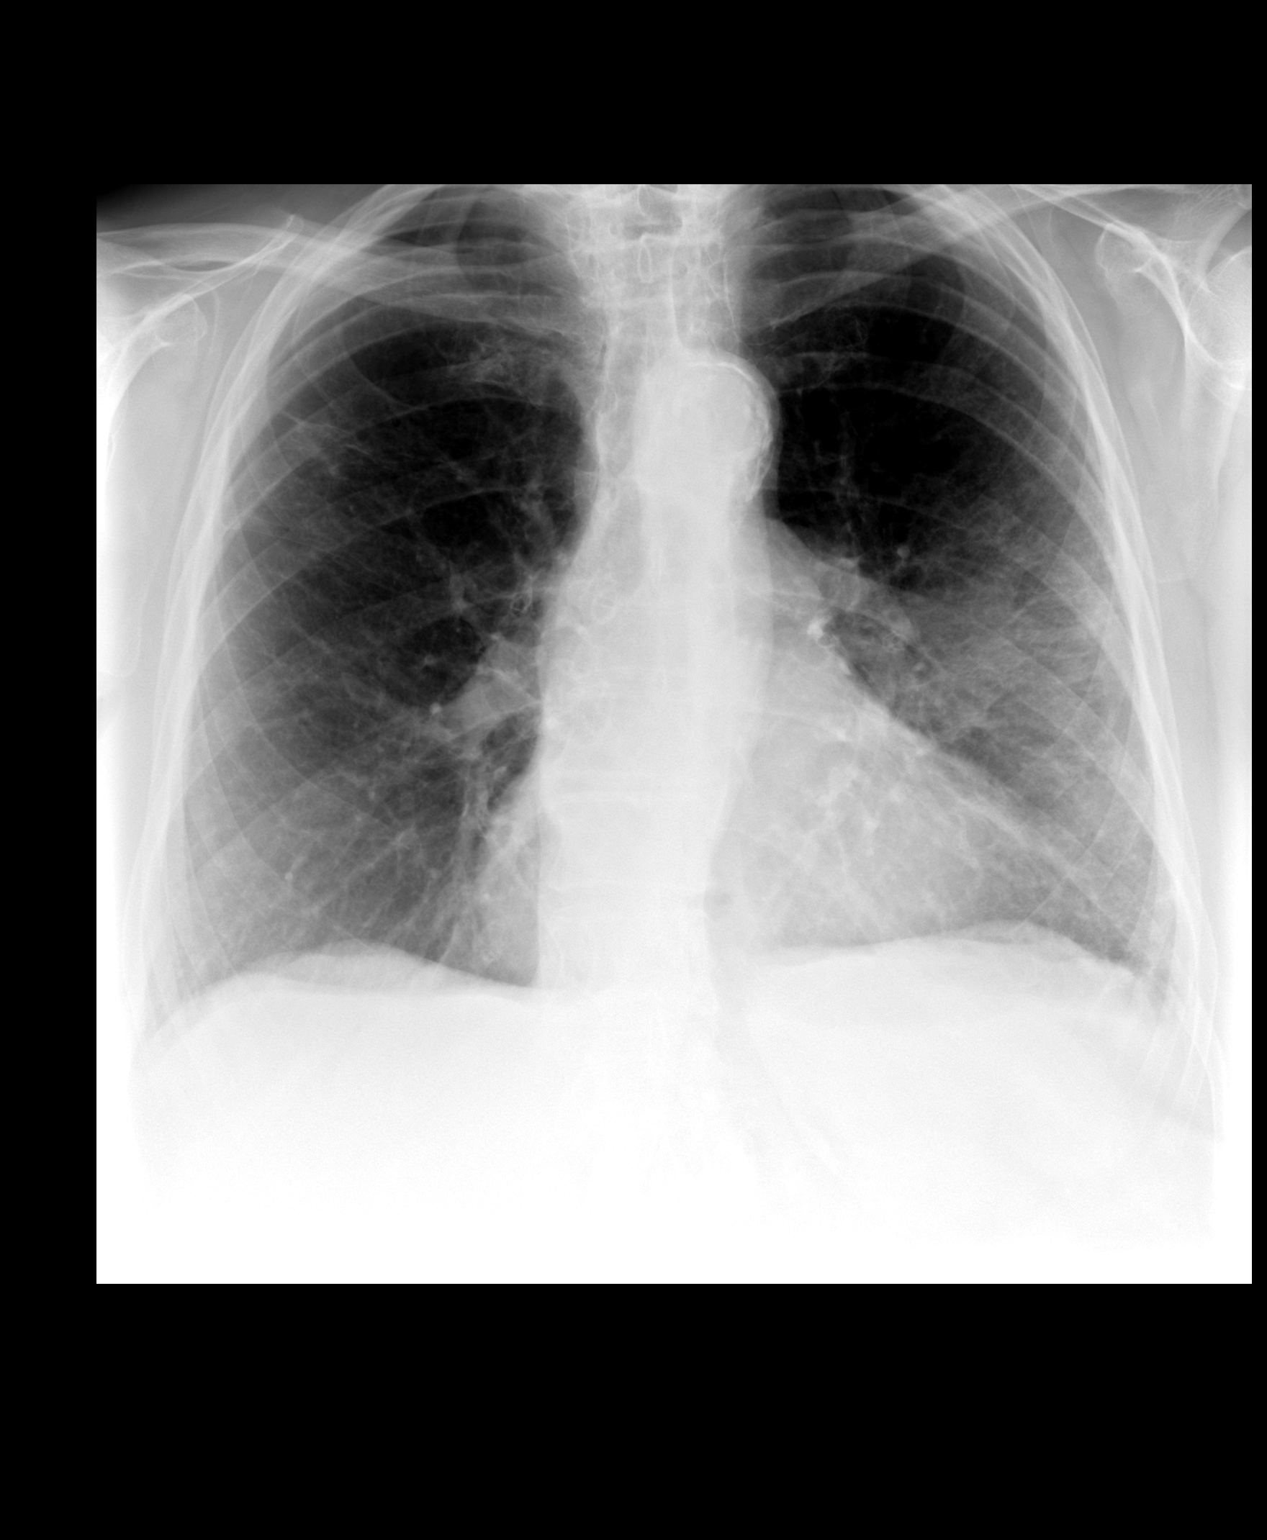

[view not recorded (2 of 2)]
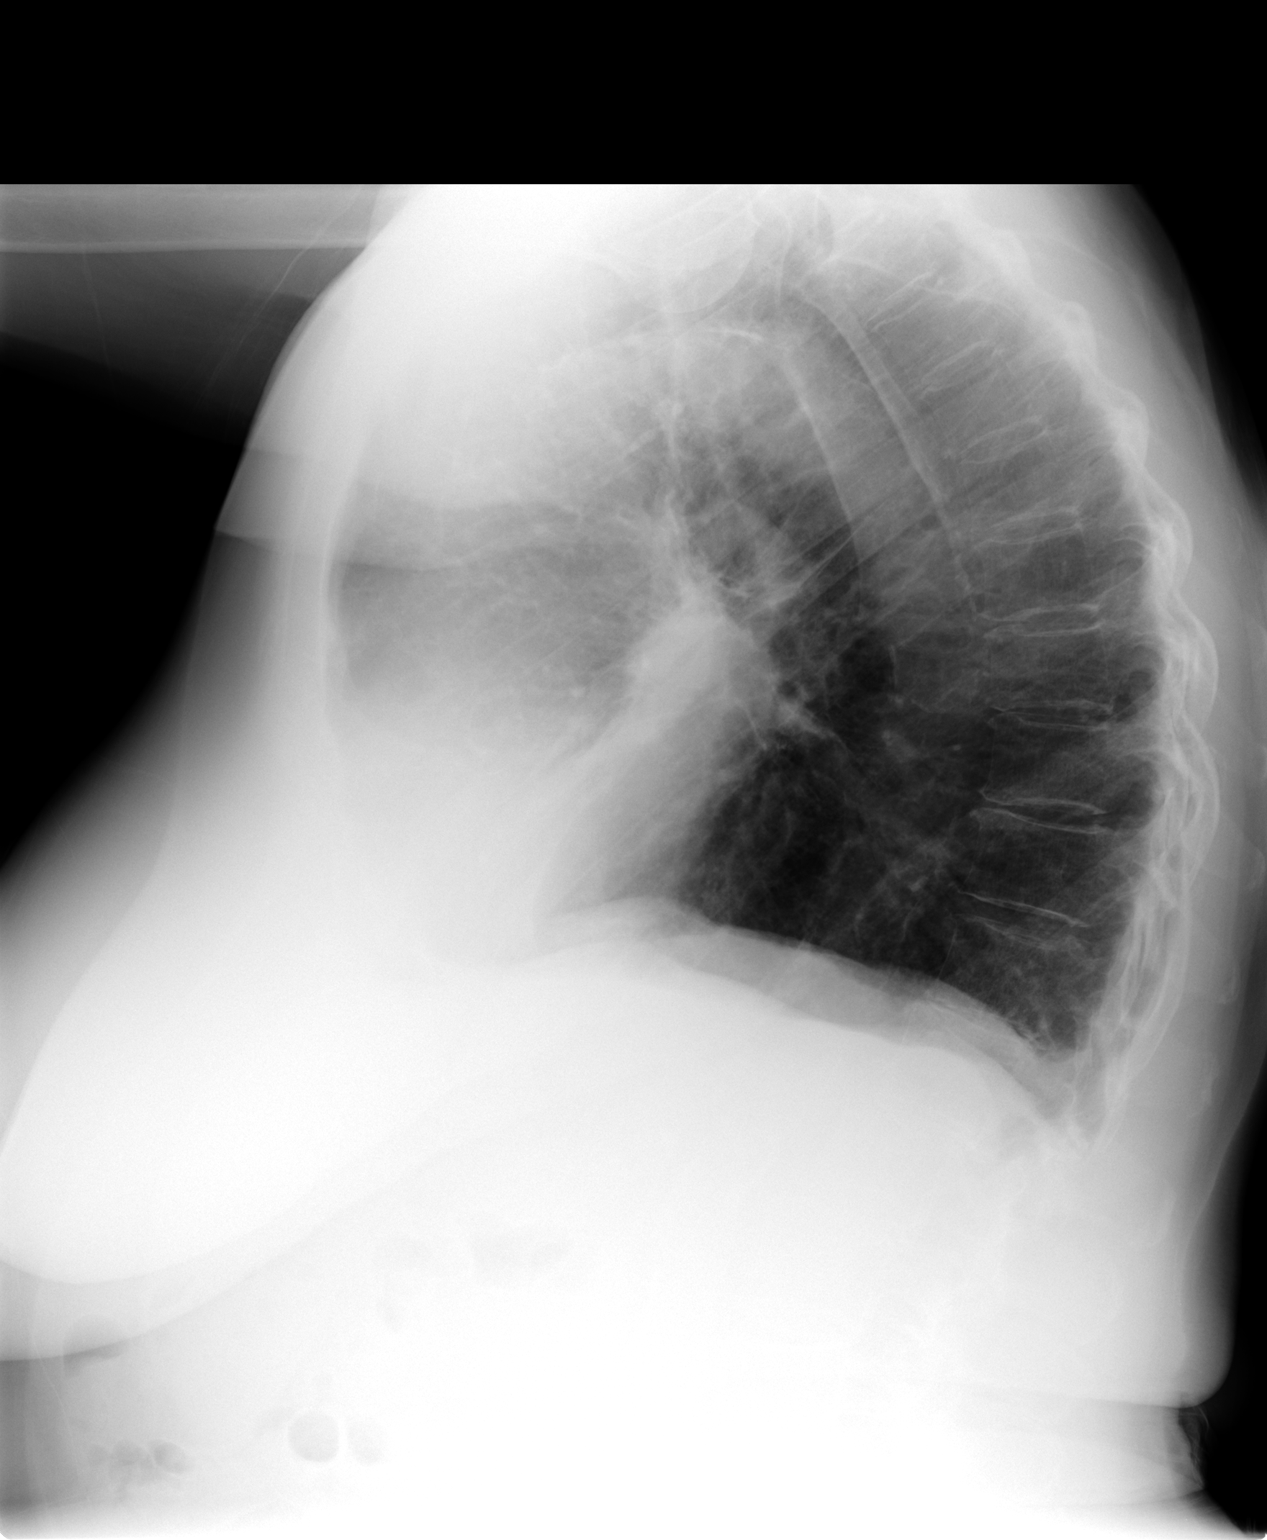

[2 of 2 positions shown; findings below may reference images not displayed]

FINDINGS: Left upper lobe and lingular densities are improved but not completely resolved.  Chronic changes elsewhere are stable, including right upper lobe scarring. There is underlying COPD.
IMPRESSION: 1.  Left upper lobe and lingular infiltrates improved but not completely resolved.
 2.  Chronic changes elsewhere stable.

## 2008-05-17 ENCOUNTER — Ambulatory Visit: Payer: Self-pay | Admitting: Pulmonary Disease

## 2008-11-18 ENCOUNTER — Encounter: Payer: Self-pay | Admitting: Pulmonary Disease

## 2008-11-24 HISTORY — PX: HEMORRHOID SURGERY: SHX153

## 2009-01-01 ENCOUNTER — Inpatient Hospital Stay (HOSPITAL_COMMUNITY): Admission: EM | Admit: 2009-01-01 | Discharge: 2009-01-02 | Payer: Self-pay | Admitting: Emergency Medicine

## 2009-01-01 ENCOUNTER — Ambulatory Visit: Payer: Self-pay | Admitting: Internal Medicine

## 2009-01-03 ENCOUNTER — Encounter: Payer: Self-pay | Admitting: Internal Medicine

## 2009-01-07 ENCOUNTER — Encounter: Payer: Self-pay | Admitting: Pulmonary Disease

## 2009-01-12 ENCOUNTER — Ambulatory Visit: Payer: Self-pay | Admitting: Pulmonary Disease

## 2009-01-12 ENCOUNTER — Telehealth (INDEPENDENT_AMBULATORY_CARE_PROVIDER_SITE_OTHER): Payer: Self-pay | Admitting: *Deleted

## 2009-01-25 ENCOUNTER — Encounter: Payer: Self-pay | Admitting: Pulmonary Disease

## 2009-01-26 ENCOUNTER — Ambulatory Visit: Payer: Self-pay | Admitting: Pulmonary Disease

## 2009-01-26 DIAGNOSIS — R1319 Other dysphagia: Secondary | ICD-10-CM

## 2009-03-17 IMAGING — CR DG CHEST 2V
2 series · 2 of 2 positions shown · non-contrast
Comparison: 09/17/2007

CLINICAL DATA: Pneumonia

CHEST - 2 VIEW

[w chest pa]
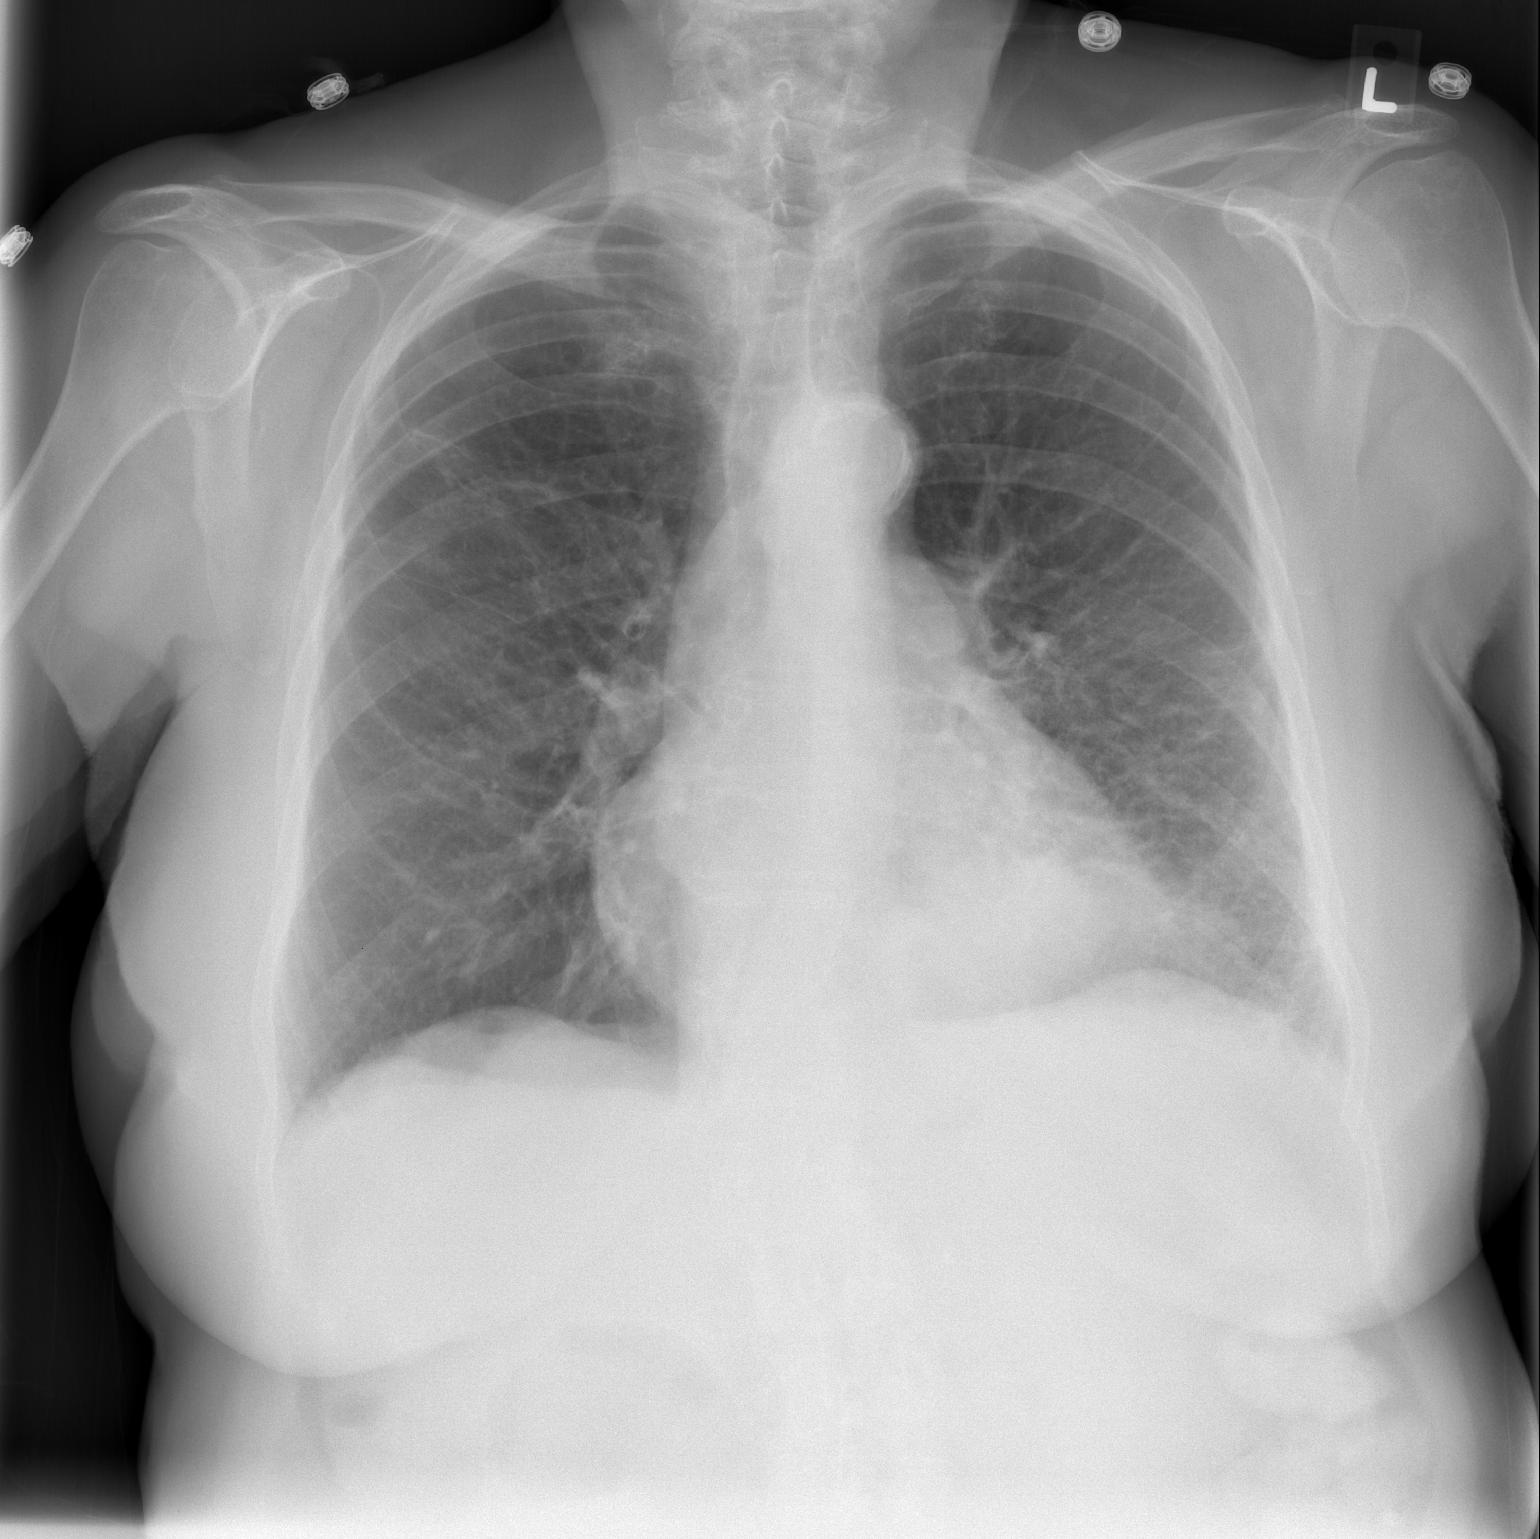

[w chest lat]
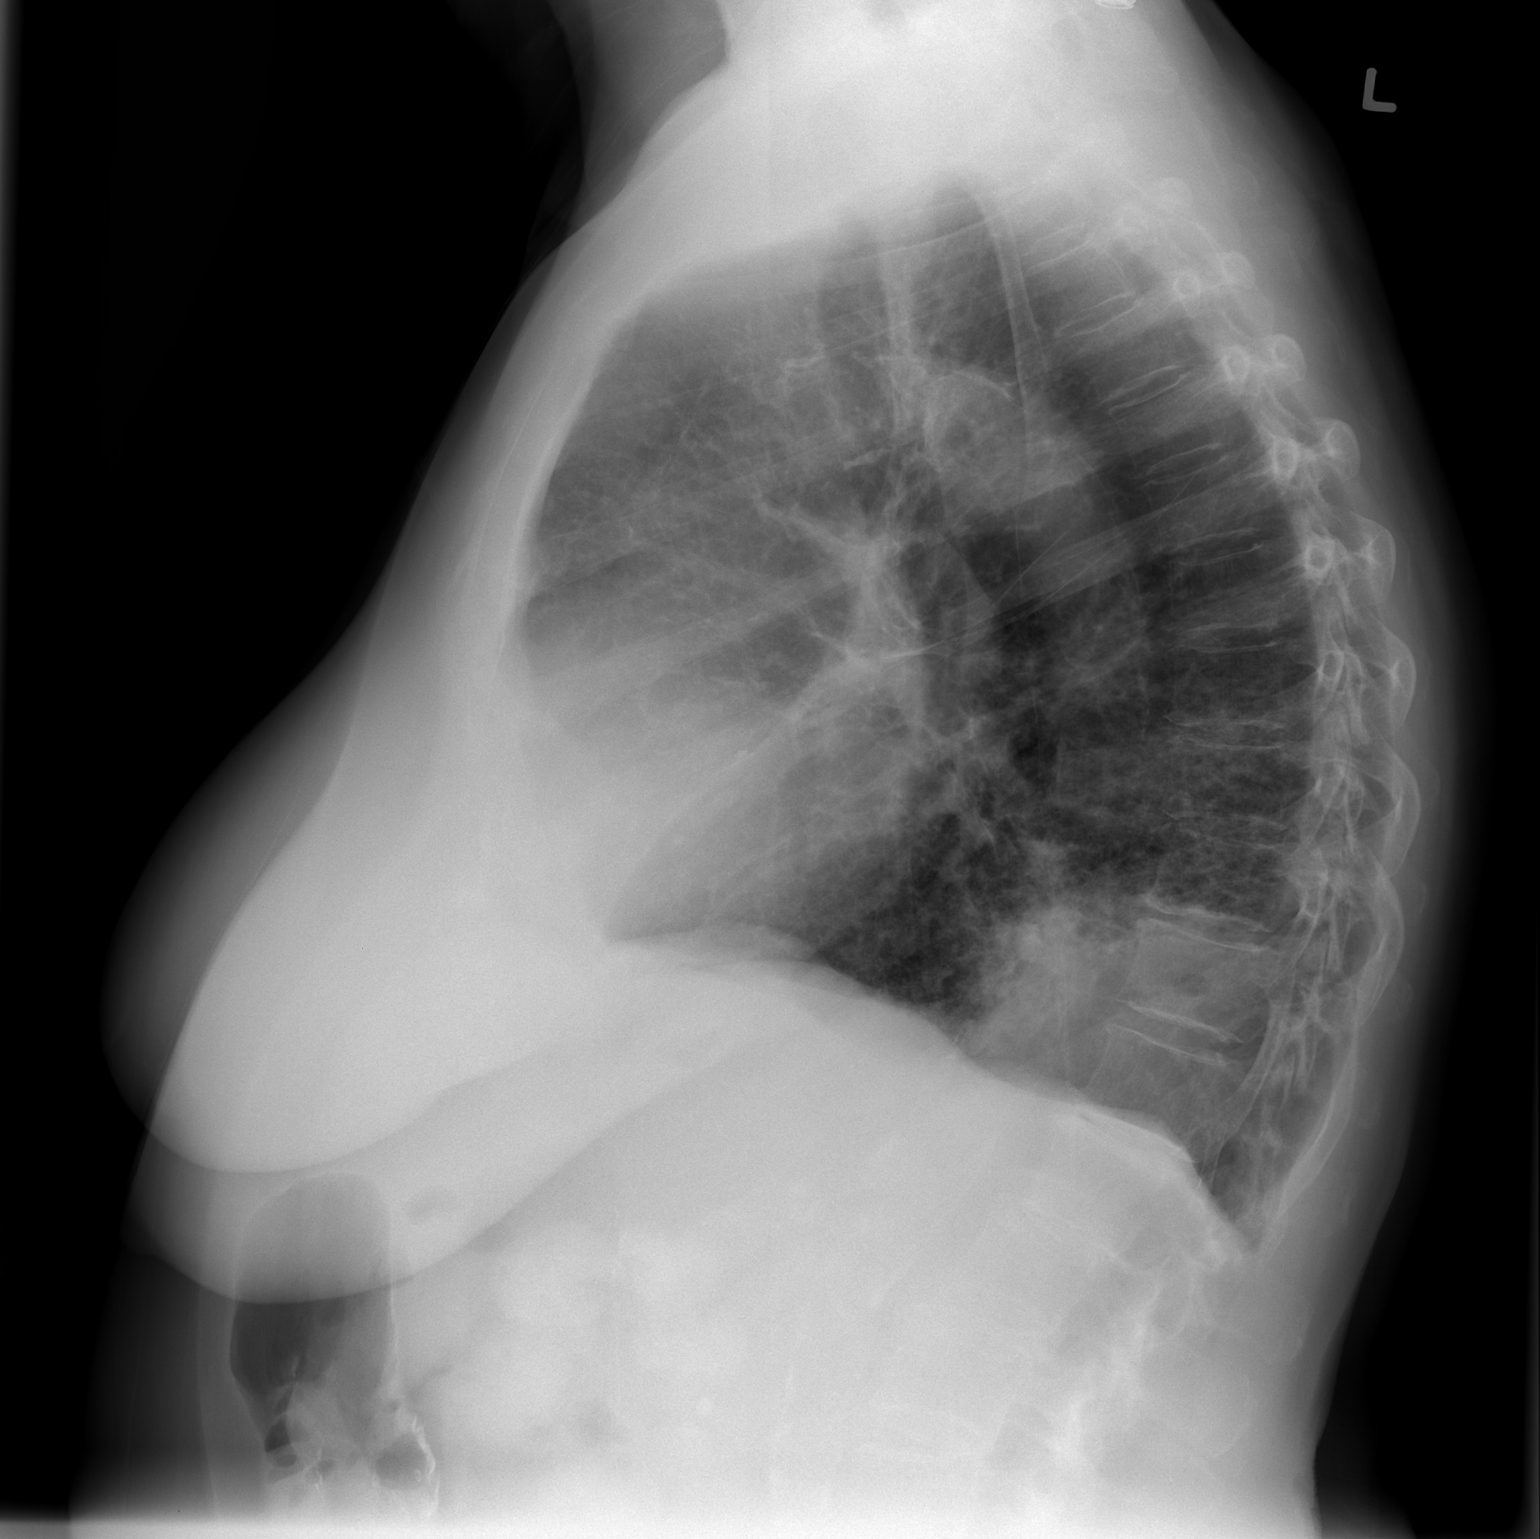

[2 of 2 positions shown; findings below may reference images not displayed]

FINDINGS: Partial clearing left lower lobe infiltrate.  The heart
is upper normal.  Atherosclerotic changes redemonstrated in the
aorta.
IMPRESSION: Partial clearing left lower lobe pneumonia.  Recommend continued
follow up until clear.

## 2009-03-31 ENCOUNTER — Ambulatory Visit: Admission: RE | Admit: 2009-03-31 | Discharge: 2009-03-31 | Payer: Self-pay | Admitting: Internal Medicine

## 2009-03-31 ENCOUNTER — Ambulatory Visit (HOSPITAL_COMMUNITY): Admission: RE | Admit: 2009-03-31 | Discharge: 2009-03-31 | Payer: Self-pay | Admitting: Internal Medicine

## 2009-03-31 ENCOUNTER — Encounter (INDEPENDENT_AMBULATORY_CARE_PROVIDER_SITE_OTHER): Payer: Self-pay | Admitting: *Deleted

## 2009-04-04 ENCOUNTER — Ambulatory Visit: Payer: Self-pay | Admitting: Pulmonary Disease

## 2009-06-14 ENCOUNTER — Ambulatory Visit: Payer: Self-pay | Admitting: Pulmonary Disease

## 2009-06-17 ENCOUNTER — Telehealth (INDEPENDENT_AMBULATORY_CARE_PROVIDER_SITE_OTHER): Payer: Self-pay | Admitting: *Deleted

## 2009-08-10 ENCOUNTER — Encounter: Payer: Self-pay | Admitting: Gastroenterology

## 2009-09-12 ENCOUNTER — Encounter: Payer: Self-pay | Admitting: Pulmonary Disease

## 2009-09-22 ENCOUNTER — Telehealth: Payer: Self-pay | Admitting: Pulmonary Disease

## 2009-10-13 ENCOUNTER — Ambulatory Visit: Payer: Self-pay | Admitting: Pulmonary Disease

## 2010-01-05 ENCOUNTER — Encounter: Payer: Self-pay | Admitting: Pulmonary Disease

## 2010-01-05 ENCOUNTER — Encounter: Payer: Self-pay | Admitting: Internal Medicine

## 2010-01-07 ENCOUNTER — Telehealth: Payer: Self-pay | Admitting: Internal Medicine

## 2010-01-08 ENCOUNTER — Telehealth: Payer: Self-pay | Admitting: Internal Medicine

## 2010-01-09 ENCOUNTER — Telehealth (INDEPENDENT_AMBULATORY_CARE_PROVIDER_SITE_OTHER): Payer: Self-pay | Admitting: *Deleted

## 2010-01-11 ENCOUNTER — Telehealth (INDEPENDENT_AMBULATORY_CARE_PROVIDER_SITE_OTHER): Payer: Self-pay | Admitting: *Deleted

## 2010-01-12 ENCOUNTER — Ambulatory Visit: Payer: Self-pay | Admitting: Internal Medicine

## 2010-01-13 ENCOUNTER — Telehealth (INDEPENDENT_AMBULATORY_CARE_PROVIDER_SITE_OTHER): Payer: Self-pay | Admitting: *Deleted

## 2010-01-17 ENCOUNTER — Encounter: Payer: Self-pay | Admitting: Internal Medicine

## 2010-01-19 ENCOUNTER — Ambulatory Visit: Payer: Self-pay | Admitting: Internal Medicine

## 2010-01-19 DIAGNOSIS — N259 Disorder resulting from impaired renal tubular function, unspecified: Secondary | ICD-10-CM | POA: Insufficient documentation

## 2010-01-19 LAB — CONVERTED CEMR LAB
Basophils Absolute: 0 10*3/uL (ref 0.0–0.1)
CO2: 25 meq/L (ref 19–32)
Calcium: 9.5 mg/dL (ref 8.4–10.5)
Creatinine, Ser: 1.4 mg/dL — ABNORMAL HIGH (ref 0.4–1.2)
Eosinophils Absolute: 0.7 10*3/uL (ref 0.0–0.7)
Glucose, Bld: 88 mg/dL (ref 70–99)
Hemoglobin: 11.8 g/dL — ABNORMAL LOW (ref 12.0–15.0)
Lymphocytes Relative: 23.5 % (ref 12.0–46.0)
MCHC: 32.1 g/dL (ref 30.0–36.0)
Neutro Abs: 8.2 10*3/uL — ABNORMAL HIGH (ref 1.4–7.7)
RDW: 20.1 % — ABNORMAL HIGH (ref 11.5–14.6)
Sodium: 136 meq/L (ref 135–145)

## 2010-02-03 ENCOUNTER — Encounter: Payer: Self-pay | Admitting: Internal Medicine

## 2010-02-10 ENCOUNTER — Ambulatory Visit: Payer: Self-pay | Admitting: Pulmonary Disease

## 2010-02-10 ENCOUNTER — Telehealth (INDEPENDENT_AMBULATORY_CARE_PROVIDER_SITE_OTHER): Payer: Self-pay | Admitting: *Deleted

## 2010-02-10 ENCOUNTER — Encounter: Payer: Self-pay | Admitting: Pulmonary Disease

## 2010-02-10 DIAGNOSIS — J9610 Chronic respiratory failure, unspecified whether with hypoxia or hypercapnia: Secondary | ICD-10-CM | POA: Insufficient documentation

## 2010-02-10 DIAGNOSIS — J189 Pneumonia, unspecified organism: Secondary | ICD-10-CM

## 2010-03-01 ENCOUNTER — Encounter: Payer: Self-pay | Admitting: Pulmonary Disease

## 2010-03-01 ENCOUNTER — Encounter: Payer: Self-pay | Admitting: Gastroenterology

## 2010-03-02 ENCOUNTER — Encounter (INDEPENDENT_AMBULATORY_CARE_PROVIDER_SITE_OTHER): Payer: Self-pay | Admitting: *Deleted

## 2010-03-16 ENCOUNTER — Encounter: Payer: Self-pay | Admitting: Pulmonary Disease

## 2010-03-17 ENCOUNTER — Encounter: Payer: Self-pay | Admitting: Internal Medicine

## 2010-04-15 ENCOUNTER — Encounter: Payer: Self-pay | Admitting: Internal Medicine

## 2010-04-16 ENCOUNTER — Encounter: Payer: Self-pay | Admitting: Internal Medicine

## 2010-04-18 ENCOUNTER — Ambulatory Visit
Admission: RE | Admit: 2010-04-18 | Discharge: 2010-04-18 | Payer: Self-pay | Source: Home / Self Care | Attending: Gastroenterology | Admitting: Gastroenterology

## 2010-04-18 DIAGNOSIS — K625 Hemorrhage of anus and rectum: Secondary | ICD-10-CM | POA: Insufficient documentation

## 2010-04-24 ENCOUNTER — Ambulatory Visit
Admission: RE | Admit: 2010-04-24 | Discharge: 2010-04-24 | Payer: Self-pay | Source: Home / Self Care | Attending: Pulmonary Disease | Admitting: Pulmonary Disease

## 2010-04-25 NOTE — Progress Notes (Signed)
Summary: increased sob--appt with RB  Phone Note Call from Patient Call back at Home Phone [phone]   Caller: Natalia Leatherwood powell//friend Call For: sood Summary of Call: Pt c/o sob x 1 week, recently had pneumonia, would like to be seen today, ok to [facility]. Initial call taken by: Darletta Moll,  January 11, 2010 1:29 PM  Follow-up for Phone Call        called and spoke with Natalia Leatherwood.  Natalia Leatherwood states pt was recently dx with pna and is now c/o increased sob and requesting to be seen today.  scheduled pt to see RB today at 2:50pm.  Arman Filter LPN  January 11, 2010 1:49 PM

## 2010-04-25 NOTE — Progress Notes (Signed)
Summary: hemoptysis  Phone Note Other Incoming   Summary of Call: daughter [name] calling. 77 years old. Lives in [facility]. Takes coumadin. Came down sick 10/12 wednesday (Flushed, weak, fever 101 per RN).  She also had hemoptysis. Nursing home doctor took cxr for mild streaky hemotpysis mixed with sputum. RN then told patient "there is inflammation in boith lungs" (patchy pneumonitis bilaterally per RN reading report to me). Placed on 1 pill x 3 days antibiotics (zpak per RN).  INR on 10/12 was 1.77.  NExt day per RN she was better progressively till yesterday. Pink hemoptysis yesterday but today RED but again small amount of blood mixed with sputum.  RN says patient otherwise fine and active and up and about.  They called earlier in the morniung. Spoke to Dr. Maple Hudson pulmonary call. REportedly Dr. Maple Hudson said antibiotic and coumadin 'clashed' and advised to stop coumadin. Now nursing home doc recommending avelox. Patient and daughter wanting to know if okay to take AVELOX (last dose of coumadin yesterday and current INR pending).    PLAN NO AVELOX - this also increases INR STart omnicef 300mg  two times a day x 6 days - will fax to RN [facility] (no allergy for this) RN to call elink back with INR results Monitor clinically Dr. Craige Cotta to decide fu IF worse, go to ER Initial call taken by: Kalman Shan MD,  January 07, 2010 4:49 PM    New/Updated Medications: CEFDINIR 300 MG CAPS (CEFDINIR) 1 tab two times a day x 6 days Prescriptions: CEFDINIR 300 MG CAPS (CEFDINIR) 1 tab two times a day x 6 days  #12 x 0   Entered and Authorized by:   Kalman Shan MD   Signed by:   Kalman Shan MD on 01/07/2010   Method used:   Print then Give to Patient   RxID:   [id number]

## 2010-04-25 NOTE — Letter (Signed)
Summary: New Patient letter  [facility]  [address]   [place], [place] [address]   Phone: [phone]  Fax: [phone]       03/02/2010 MRN: [id number]  [name] [address] [place], [place]  [address]  Dear [name],  Welcome to the Gastroenterology Division at [facility].    You are scheduled to see Dr.  Jarold Motto on 04/18/2010 at 1:30 on the 3rd floor at [facility], [address]  We ask that you try to arrive at our office 15 minutes prior to your appointment time to allow for check-in.  We would like you to complete the enclosed self-administered evaluation form prior to your visit and bring it with you on the day of your appointment.  We will review it with you.  Also, please bring a complete list of all your medications or, if you prefer, bring the medication bottles and we will list them.  Please bring your insurance card so that we may make a copy of it.  If your insurance requires a referral to see a specialist, please bring your referral form from your primary care physician.  Co-payments are due at the time of your visit and may be paid by cash, check or credit card.     Your office visit will consist of a consult with your physician (includes a physical exam), any laboratory testing he/she may order, scheduling of any necessary diagnostic testing (e.g. x-ray, ultrasound, CT-scan), and scheduling of a procedure (e.g. Endoscopy, Colonoscopy) if required.  Please allow enough time on your schedule to allow for any/all of these possibilities.    If you cannot keep your appointment, please call [phone] to cancel or reschedule prior to your appointment date.  This allows [place] the opportunity to schedule an appointment for another patient in need of care.  If you do not cancel or reschedule by 5 p.m. the business day prior to your appointment date, you will be charged a $50.00 late cancellation/no-show fee.    Thank you  for choosing [facility] for your medical needs.  We appreciate the opportunity to care for you.  Please visit [place] at our website  to learn more about our practice.                     Sincerely,                                                             The Gastroenterology Division

## 2010-04-25 NOTE — Assessment & Plan Note (Signed)
Summary: rov/jd   Primary Provider/Referring Provider:  [facility]  CC:  follow up, pt states her breathing is getting worse, she stills have the SOB with activity, and .  History of Present Illness: I saw [name] in follow up for her COPD, rhinitis, abnormal xray and recurrent pneumonia  She has moved to [facility].    She still gets winded easily with exertion.  She has some cough with clear sputum.  She denies hemoptysis or fever.  She gets occasional wheeze.  She denies chest pain or much leg swelling.  Preventive Screening-Counseling & Management  Alcohol-Tobacco     Smoking Status: quit     Packs/Day: 1.5     Year Quit: 17 years ago  Caffeine-Diet-Exercise     Does Patient Exercise: no  Current Medications (verified): 1)  Spiriva Handihaler 18 Mcg Caps (Tiotropium Bromide Monohydrate) .... One Puff Once Daily 2)  Symbicort 160-4.5 Mcg/act  Aero (Budesonide-Formoterol Fumarate) .... 2 Puffs Two Times A Day 3)  Coumadin 5 Mg Tabs (Warfarin Sodium) .... Take 1 Tablet By Mouth Once A Day or As Directed 4)  Coumadin 2.5 Mg Tabs (Warfarin Sodium) .Marland Kitchen.. 1 By Mouth Every Wednesday 5)  Crestor 5 Mg Tabs (Rosuvastatin Calcium) .... Take 1 Tablet By Mouth Once A Day 6)  Fluoxetine Hcl 40 Mg Caps (Fluoxetine Hcl) .... I1by Mouth Every Day 7)  Gabapentin 100 Mg Caps (Gabapentin) .... Take 1 Capsule By Mouth Every Morning and Three Every Evening 8)  Levothyroxine Sodium 88 Mcg Tabs (Levothyroxine Sodium) .... Take 1 Tablet By Mouth Once A Day 9)  Premarin 0.3 Mg Tabs (Estrogens Conjugated) .... Once Daily 10)  Celebrex 100 Mg  Caps (Celecoxib) .... One By Mouth Twice Daily 11)  Nitroglycerin 0.3 Mg Subl (Nitroglycerin) .... Take 1 Tablet Under Tongue As Directed 12)  Promethazine Hcl 12.5 Mg  Supp (Promethazine Hcl) .... Three Times A Day As Needed 13)  Fish Oil 1200 Mg Caps (Omega-3 Fatty Acids) .Marland Kitchen.. 1 By Mouth Two Times Daily 14)  Folic  Acid 1 Mg Tabs (Folic Acid) .... Take 1 Tablet By Mouth Once A Day 15)  Aerochamber Plus  Misc (Spacer/aero-Holding Chambers) .... Use As Directed 16)  Diphenhydramine Hcl 50 Mg Caps (Diphenhydramine Hcl) .... One By Mouth At Bedtime As Needed For Allergies 17)  Omeprazole 20 Mg Cpdr (Omeprazole) .... One By Mouth Every Am 18)  Citrucel 500 Mg Tabs (Methylcellulose (Laxative)) .... 2 By Mouth Every Am 19)  Peridex 0.12 % Soln (Chlorhexidine Gluconate) .... Swish With 15 Ml By Mouth For 30 Secs The Spit--Twice Daily 20)  Zyrtec Allergy 10 Mg Tabs (Cetirizine Hcl) .Marland Kitchen.. 1 By Mouth Every Night At Bedtime 21)  Cloderm 0.1 % Crea (Clocortolone Pivalate) .... Apply To Lesion On Left Cheek Twice Daily  Allergies: 1)  ! Minocin (Minocycline Hcl)  Past History:  Past Medical History: Cholelithiasis Congestive heart failure Hypertension Hypothyroidism Carotid vascular disease TIA---coumadin Leg paresthesias Macular degeneration Mild Aortic valve stenosis Cerebrovascular accident, hx of Arthritis Blood in stool Cancer UTI high cholesterol Diverticulitis, hx of GERD Seizure disorder Urinary incontinence Dementia COPD      - PFT 04/09/07 FEV1 1.75(107%), FEV1% 58, TLC 5.21(111%), DLCO 41%, no BD Hypoxemia      - SpO2 86% on RA @ rest on 12/08/07      - 2 liters continuous flow  Past Surgical History: Reviewed history from 01/12/2009 and no changes required. Hysterectomy ear surgery Tonsillectomy breast biopsy  x 2  Cataract extraction  Social History: Single Lives in [facility] Regular exercise-no Retired Former Smoker Alcohol use-yes Drug use-no Packs/Day:  1.5  Vital Signs:  Patient profile:   77 year old female Height:      64 inches Weight:      176.4 pounds BMI:     30.39 O2 Sat:      94 % on Room air Temp:     97.4 degrees F oral Pulse rate:   76 / minute BP sitting:   110 / 64  (left arm) Cuff size:   regular  Vitals Entered By:  Carver Fila (October 13, 2009 11:49 AM)  O2 Flow:  Room air CC: follow up, pt states her breathing is getting worse, she stills have the SOB with activity,  Comments meds and allergies updated Phone number updated Carver Fila  October 13, 2009 11:49 AM    Physical Exam  Nose:  no sinus tenderness, clear nasal discharge.   Mouth:  no oral lesion Neck:  no JVD.   Lungs:  no wheezing or rales, decreased breath sounds, prolonged exhalation Heart:  regular rhythm, normal rate, and no murmurs.   Extremities:  no edema Cervical Nodes:  No lymphadenopathy noted   Impression & Recommendations:  Problem # 1:  COPD (ICD-496) Continue current regimen.  Explained that she will always have some degree of dyspnea.  Encouraged her to continue with her exercise program.  Problem # 2:  HYPOXEMIA (ICD-799.02)  She is to continue on supplemental oxygen.  Problem # 3:  CHEST XRAY, ABNORMAL (ICD-793.1)  Left upper lobe nodular density was not as apparent on last chest xray from January 2011.  Will continue clinical monitoring, and consider repeat imaging studies if her symptoms progress.  She again confirmed that she would not want aggressive interventions.  Medications Added to Medication List This Visit: 1)  Coumadin 2.5 Mg Tabs (Warfarin sodium) .Marland Kitchen.. 1 by mouth every wednesday 2)  Fluoxetine Hcl 40 Mg Caps (Fluoxetine hcl) .... I1by mouth every day 3)  Fish Oil 1200 Mg Caps (Omega-3 fatty acids) .Marland Kitchen.. 1 by mouth two times daily 4)  Omeprazole 20 Mg Cpdr (Omeprazole) .... One by mouth every am 5)  Citrucel 500 Mg Tabs (Methylcellulose (laxative)) .... 2 by mouth every am 6)  Peridex 0.12 % Soln (Chlorhexidine gluconate) .... Swish with 15 ml by mouth for 30 secs the spit--twice daily 7)  Zyrtec Allergy 10 Mg Tabs (Cetirizine hcl) .Marland Kitchen.. 1 by mouth every night at bedtime 8)  Cloderm 0.1 % Crea (Clocortolone pivalate) .... Apply to lesion on left cheek twice daily  Complete Medication List: 1)  Spiriva  Handihaler 18 Mcg Caps (Tiotropium bromide monohydrate) .... One puff once daily 2)  Symbicort 160-4.5 Mcg/act Aero (Budesonide-formoterol fumarate) .... 2 puffs two times a day 3)  Coumadin 5 Mg Tabs (Warfarin sodium) .... Take 1 tablet by mouth once a day or as directed 4)  Coumadin 2.5 Mg Tabs (Warfarin sodium) .Marland Kitchen.. 1 by mouth every wednesday 5)  Crestor 5 Mg Tabs (Rosuvastatin calcium) .... Take 1 tablet by mouth once a day 6)  Fluoxetine Hcl 40 Mg Caps (Fluoxetine hcl) .... I1by mouth every day 7)  Gabapentin 100 Mg Caps (Gabapentin) .... Take 1 capsule by mouth every morning and three every evening 8)  Levothyroxine Sodium 88 Mcg Tabs (Levothyroxine sodium) .... Take 1 tablet by mouth once a day 9)  Premarin 0.3 Mg Tabs (Estrogens conjugated) .... Once daily  10)  Celebrex 100 Mg Caps (Celecoxib) .... One by mouth twice daily 11)  Nitroglycerin 0.3 Mg Subl (Nitroglycerin) .... Take 1 tablet under tongue as directed 12)  Promethazine Hcl 12.5 Mg Supp (Promethazine hcl) .... Three times a day as needed 13)  Fish Oil 1200 Mg Caps (Omega-3 fatty acids) .Marland Kitchen.. 1 by mouth two times daily 14)  Folic Acid 1 Mg Tabs (Folic acid) .... Take 1 tablet by mouth once a day 15)  Aerochamber Plus Misc (Spacer/aero-holding chambers) .... Use as directed 16)  Diphenhydramine Hcl 50 Mg Caps (Diphenhydramine hcl) .... One by mouth at bedtime as needed for allergies 17)  Omeprazole 20 Mg Cpdr (Omeprazole) .... One by mouth every am 18)  Citrucel 500 Mg Tabs (Methylcellulose (laxative)) .... 2 by mouth every am 19)  Peridex 0.12 % Soln (Chlorhexidine gluconate) .... Swish with 15 ml by mouth for 30 secs the spit--twice daily 20)  Zyrtec Allergy 10 Mg Tabs (Cetirizine hcl) .Marland Kitchen.. 1 by mouth every night at bedtime 21)  Cloderm 0.1 % Crea (Clocortolone pivalate) .... Apply to lesion on left cheek twice daily  Other Orders: Est. Patient Level III ([address])  Patient Instructions: 1)  Follow up in 6 months

## 2010-04-25 NOTE — Progress Notes (Signed)
Summary: rx req/ pharm calling- needs asap  Phone Note From Pharmacy   Caller: nancy w/ [facility] Call For: sood  Summary of Call: pt was recently seen by vs. [facility] calling to request a "verbal order" for 50mg  benadryl (per pt instructions). says OTC only comes in strength of 25mg . if nurse can call back asap they can dekiver this to "visually impaired pt this afternoon, but they must receive a call back asap. [phone] Initial call taken by: Tivis Ringer, CNA,  June 17, 2009 12:31 PM  Follow-up for Phone Call        pt recently saw VS on 06-14-2009 and per pt instructions, VS suggested she take Benadryl 50mg  at bedtime as needed for allergies in place of Claritin.  I updated this in pt's med list and called rx into [facility].  Aundra Millet Reynolds LPN  June 17, 2009 2:19 PM     New/Updated Medications: * BENADRYL 50MG  TABS Take 1 tab by mouth at bedtime as needed for allergies Prescriptions: BENADRYL 50MG  TABS Take 1 tab by mouth at bedtime as needed for allergies  #30 x 3   Entered by:   Arman Filter LPN   Authorized by:   Coralyn Helling MD   Signed by:   Arman Filter LPN on 84/13/2440   Method used:   Telephoned to ...       [facility] (retail)       [address]       [place], [place]  [id number]       Ph: [phone] or [phone]       Fax: [phone]   RxID:   [id number]

## 2010-04-25 NOTE — Assessment & Plan Note (Signed)
Summary: Pulmonary/ acute ext ov    Primary Provider/Referring Provider:  [facility]  CC:  DOE and cough- worse x 1 wk.  .  History of Present Illness: 77 yowf quit smoking in 1995 with COPD and chronic respiratory failure on 02 at bedtime and as needed daytime  January 12, 2010  ov  DOE and cough- worse x 1 wk seen by  dr Chilton Si rx with Zpak then coughed up traces of blood stopped coumadin, resolved,  then took omnicef finished 10/20,  still having sweats at night. no def fever, no rigors.  Pt denies any significant sore throat, dysphagia, itching, sneezing,  nasal congestion or excess secretions,  unintended wt loss, pleuritic or exertional cp, , change in activity tolerance  orthopnea pnd or leg swelling. Pt also denies any obvious fluctuation in symptoms with weather or environmental change or other alleviating or aggravating factors.        Current Medications (verified): 1)  Spiriva Handihaler 18 Mcg Caps (Tiotropium Bromide Monohydrate) .... One Puff Once Daily 2)  Symbicort 160-4.5 Mcg/act  Aero (Budesonide-Formoterol Fumarate) .... 2 Puffs Two Times A Day 3)  Coumadin 5 Mg Tabs (Warfarin Sodium) .... Take 1 Tablet By Mouth Once A Day or As Directed 4)  Coumadin 2.5 Mg Tabs (Warfarin Sodium) .Marland Kitchen.. 1 By Mouth Every Wednesday 5)  Crestor 5 Mg Tabs (Rosuvastatin Calcium) .... Take 1 Tablet By Mouth Once A Day 6)  Fluoxetine Hcl 40 Mg Caps (Fluoxetine Hcl) .... I1by Mouth Every Day 7)  Gabapentin 100 Mg Caps (Gabapentin) .... Take 1 Capsule By Mouth Every Morning and Three Every Evening 8)  Levothyroxine Sodium 88 Mcg Tabs (Levothyroxine Sodium) .... Take 1 Tablet By Mouth Once A Day 9)  Premarin 0.3 Mg Tabs (Estrogens Conjugated) .... Once Daily 10)  Celebrex 100 Mg  Caps (Celecoxib) .... One By Mouth Twice Daily 11)  Nitroglycerin 0.3 Mg Subl (Nitroglycerin) .... Take 1 Tablet Under Tongue As Directed 12)  Fish Oil 1200 Mg Caps (Omega-3 Fatty Acids) .Marland Kitchen.. 1 By  Mouth Two Times Daily 13)  Folic Acid 1 Mg Tabs (Folic Acid) .... Take 1 Tablet By Mouth Once A Day 14)  Aerochamber Plus  Misc (Spacer/aero-Holding Chambers) .... Use As Directed 15)  Omeprazole 20 Mg Cpdr (Omeprazole) .... One By Mouth Every Am 16)  Peridex 0.12 % Soln (Chlorhexidine Gluconate) .... Swish With 15 Ml By Mouth For 30 Secs The Spit--Twice Daily 17)  Zyrtec Allergy 10 Mg Tabs (Cetirizine Hcl) .Marland Kitchen.. 1 By Mouth Every Night At Bedtime 18)  Cefdinir 300 Mg Caps (Cefdinir) .Marland Kitchen.. 1 Tab Two Times A Day X 6 Days 19)  Nisoldipine 8.5 Mg Xr24h-Tab (Nisoldipine) .Marland Kitchen.. 1 Two Times A Day 20)  Patanol 0.1 % Soln (Olopatadine Hcl) .Marland Kitchen.. 1 Drop Each Eye Two Times A Day 21)  Synthroid 88 Mcg Tabs (Levothyroxine Sodium) .Marland Kitchen.. 1 Every Am 22)  Aricept 10 Mg Tabs (Donepezil Hcl) .Marland Kitchen.. 1 At Bedtime 23)  Remeron 15 Mg Tabs (Mirtazapine) .Marland Kitchen.. 1 At Bedtime 24)  Duoneb 0.5-2.5 (3) Mg/76ml Soln (Ipratropium-Albuterol) .Marland Kitchen.. 1 Vial in Nebulizer Every 6 Hrs As Needed 25)  Mucinex Dm 30-600 Mg Xr12h-Tab (Dextromethorphan-Guaifenesin) .Marland Kitchen.. 1 Two Times A Day As Needed  Allergies (verified): 1)  ! Minocin (Minocycline Hcl)  Past History:  Past Medical History: Cholelithiasis Congestive heart failure Hypertension Hypothyroidism Carotid vascular disease TIA---coumadin Leg paresthesias Macular degeneration Mild Aortic valve stenosis Cerebrovascular accident, hx of Arthritis Blood in stool Cancer UTI high cholesterol  Diverticulitis, hx of GERD Seizure disorder Urinary incontinence Dementia COPD      - PFT 04/09/07 FEV1 1.75(107%), FEV1% 58, TLC 5.21(111%), DLCO 41%,  no BD Hypoxemia      - SpO2 86% on RA  @ rest on 12/08/07      - 2 liters continuous flow  Social History: Single Lives in [facility] Regular exercise-no Retired Former Smoker quit 1995  Alcohol use-yes Drug use-no  Vital Signs:  Patient profile:   77 year old female Weight:      176 pounds O2 Sat:       95 % on 2 L/min Temp:     98.3 degrees F oral Pulse rate:   77 / minute BP sitting:   122 / 80  (left arm)  Vitals Entered By: Vernie Murders (January 12, 2010 9:49 AM)  O2 Flow:  2 L/min  Physical Exam  Additional Exam:  chronically ill eldelry wf nad in w/c on 02 HEENT mild turbinate edema.  Oropharynx no thrush or excess pnd or cobblestoning.  No JVD or cervical adenopathy. Mild accessory muscle hypertrophy. Trachea midline, nl thryroid. Chest was hyperinflated by percussion with diminished breath sounds and moderate increased exp time without wheeze. Hoover sign positive at mid inspiration. Regular rate and rhythm without murmur gallop or rub or increase P2 or edema.  Abd: nl excursion. Ext warm without cyanosis or clubbing.     CXR  Procedure date:  01/12/2010  Findings:      Comparison: 04/04/2009 and 01/26/2009   Findings: There is an area of infiltrate in the left upper lobe. The lungs are otherwise clear except for slight linear scarring in the right upper lobe.   There is cardiomegaly with a large hiatal hernia.   IMPRESSION: New area of infiltrate in the left upper lobe extending to the periphery.  Impression & Recommendations:  Problem # 1:  CHEST XRAY, ABNORMAL (ICD-793.1) c/w HCAP in pt with hypoxemic resp failure, already received zpak and omnicef and complicated by hemoptysis on coumdan  rec  levquin x 5 days.  h/o smoking and hemoptysis worrisome for underlying ca so will need close f/u if not admit  Problem # 2:  HYPOXEMIA (ICD-799.02)  Chronic but not typically needing 02 at rest.  well compensated on 2lpm which she should continue to use while sob daytime as well as automatically at night, reviewed  Orders: Est. Patient Level IV (51884)  Problem # 3:  GERD (ICD-530.81)  Her updated medication list for this problem includes:    Omeprazole 20 Mg Cpdr (Omeprazole) ..... One by mouth every am  diet reviewed - should probably avoid oil based vit  here so stop fish oi  Medications Added to Medication List This Visit: 1)  Nisoldipine 8.5 Mg Xr24h-tab (Nisoldipine) .Marland Kitchen.. 1 two times a day 2)  Patanol 0.1 % Soln (Olopatadine hcl) .Marland Kitchen.. 1 drop each eye two times a day 3)  Synthroid 88 Mcg Tabs (Levothyroxine sodium) .Marland Kitchen.. 1 every am 4)  Aricept 10 Mg Tabs (Donepezil hcl) .Marland Kitchen.. 1 at bedtime 5)  Remeron 15 Mg Tabs (Mirtazapine) .Marland Kitchen.. 1 at bedtime 6)  Duoneb 0.5-2.5 (3) Mg/70ml Soln (Ipratropium-albuterol) .Marland Kitchen.. 1 vial in nebulizer every 6 hrs as needed 7)  Mucinex Dm 30-600 Mg Xr12h-tab (Dextromethorphan-guaifenesin) .Marland Kitchen.. 1 two times a day as needed  Other Orders: T-2 View CXR (71020TC)  Patient Instructions: 1)  Stop fish oil 2)  Stop coumadin at first sign of any bleeding  3)  If the mucus gets nastier or running fever again over 100.6  in color rec Levaquin 750 x 5 day 4)  use duoneb if needed  5)  Keep follow up appts with Dr Craige Cotta 6)  Late add: based on cxr rx x 5 days Levaquin and hold coumdin x 3 days then regroup 10/24

## 2010-04-25 NOTE — Miscellaneous (Signed)
Summary: [facility]   Imported By: Lester [place] 02/20/2010 07:34:31  _____________________________________________________________________  External Attachment:    Type:   Image     Comment:   External Document

## 2010-04-25 NOTE — Progress Notes (Signed)
Summary: nos appt  Phone Note Call from Patient   Caller: [email or web address]  Call For: Cathe Bilger Summary of Call: Rsc nos from 6/29 to 7/21 @ 11:45a. Initial call taken by: Darletta Moll,  September 22, 2009 9:40 AM

## 2010-04-25 NOTE — Assessment & Plan Note (Signed)
Summary: rov this date per pt req///kp   Primary Provider/Referring Provider:  Dr. Felipa Eth @ [facility]  CC:  COPD.  The patient c/o increased sob with any exertion.Marland Kitchen  History of Present Illness: I saw [name] in follow up for her COPD, rhinitis, abnormal xray and recurrent pneumonia  She has continued dyspnea with exertion and occasional wheeze.  Use of flutter valve helps with expectoration.  She denies chest pain, or fever.  She has been getting a runny nose with the change in seasons.   Current Medications (verified): 1)  Spiriva Handihaler 18 Mcg Caps (Tiotropium Bromide Monohydrate) .... One Puff Once Daily 2)  Symbicort 160-4.5 Mcg/act  Aero (Budesonide-Formoterol Fumarate) .... 2 Puffs Two Times A Day 3)  Proair Hfa 108 (90 Base) Mcg/act Aers (Albuterol Sulfate) .... Two Puffs Up To Four Times Per Day As Needed 4)  Nasonex 50 Mcg/act  Susp (Mometasone Furoate) .... 2 Sprays Once Daily As Needed 5)  Claritin 10 Mg  Tabs (Loratadine) .Marland Kitchen.. 1 By Mouth Once Daily As Needed 6)  Furosemide 20 Mg Tabs (Furosemide) .... Take 1 Capsule By Mouth Once Every Other Day 7)  Sular 8.5 Mg  Tb24 (Nisoldipine) .... Take 1 Tablet By Mouth Two Times A Day 8)  Coumadin 5 Mg Tabs (Warfarin Sodium) .... Take 1 Tablet By Mouth Once A Day or As Directed 9)  Coumadin 1 Mg  Tabs (Warfarin Sodium) .... As Directed 10)  Crestor 5 Mg Tabs (Rosuvastatin Calcium) .... Take 1 Tablet By Mouth Once A Day 11)  Fluoxetine Hcl 20 Mg  Tabs (Fluoxetine Hcl) .... Take 1 Tab By Mouth Daily 12)  Aricept 10 Mg  Tabs (Donepezil Hcl) .... Once Daily 13)  Gabapentin 100 Mg Caps (Gabapentin) .... Take 1 Capsule By Mouth Every Morning and Three Every Evening 14)  Levothyroxine Sodium 88 Mcg Tabs (Levothyroxine Sodium) .... Take 1 Tablet By Mouth Once A Day 15)  Protonix 40 Mg  Tbec (Pantoprazole Sodium) .... Once Daily 16)  Premarin 0.3 Mg Tabs (Estrogens Conjugated) .... Once Daily 17)  Celebrex 100 Mg  Caps (Celecoxib) ....  One By Mouth Twice Daily 18)  Nitroglycerin 0.3 Mg Subl (Nitroglycerin) .... Take 1 Tablet Under Tongue As Directed 19)  Hydrocodone-Acetaminophen 5-500 Mg  Tabs (Hydrocodone-Acetaminophen) .... 2 By Mouth Q 6 Hours As Needed 20)  Promethazine Hcl 12.5 Mg  Supp (Promethazine Hcl) .... Three Times A Day As Needed 21)  Fish Oil 500 Mg Caps (Omega-3 Fatty Acids) .... Take 1 Capsule By Mouth Twice A Day 22)  Folic Acid 1 Mg Tabs (Folic Acid) .... Take 1 Tablet By Mouth Once A Day 23)  Aerochamber Plus  Misc (Spacer/aero-Holding Chambers) .... Use As Directed  Allergies (verified): 1)  ! Minocin (Minocycline Hcl)  Past History:  Past Medical History: Reviewed history from 01/12/2009 and no changes required. Cholelithiasis Congestive heart failure Hypertension Hypothyroidism Carotid vascular disease TIA---coumadin Leg paresthesias Macular degeneration Mild Aortic valve stenosis Cerebrovascular accident, hx of Arthritis Blood in stool Cancer UTI high cholesterol Diverticulitis, hx of GERD Seizure disorder Urinary incontinence Dementia COPD      - PFT 04/09/07 FEV1 1.75(107%), FVC 3.03(123%), FEV1% 58, TLC 5.21(111%), DLCO 41%, no BD Hypoxemia      - SpO2 86% on RA @ rest on 12/08/07      - 2 liters continuous flow  Past Surgical History: Reviewed history from 01/12/2009 and no changes required. Hysterectomy ear surgery Tonsillectomy breast biopsy x 2  Cataract extraction  Vital  Signs:  Patient profile:   77 year old female Height:      64 inches (162.56 cm) Weight:      176 pounds (80 kg) O2 Sat:      94 % on Room air Temp:     97.9 degrees F (36.61 degrees C) oral Pulse rate:   79 / minute BP sitting:   112 / 70  (left arm) Cuff size:   regular  Vitals Entered By: Michel Bickers CMA (June 14, 2009 2:50 PM)  O2 Sat at Rest %:  94 O2 Flow:  Room air  Physical Exam  General:  on supplemental oxygen.   Nose:  no sinus tenderness, clear nasal discharge.     Mouth:  no oral lesion Neck:  no JVD.   Lungs:  no wheezing or rales, decreased breath sounds, prolonged exhalation Heart:  regular rhythm, normal rate, and no murmurs.   Extremities:  no edema Cervical Nodes:  No lymphadenopathy noted   Impression & Recommendations:  Problem # 1:  COPD (ICD-496) I explained to her that with her lung disease she will always have some dysnea, cough, and sputum.  She is to continue with her inhaler regimen and flutter valve.  She would like to be able to increase her exercise tolerance.  Will look into whether she can arrange for pulmonary rehab at [facility], or have rehab at [facility].  Problem # 2:  HYPOXEMIA (ICD-799.02) She is to continue on supplemental oxygen.  Problem # 3:  CHEST XRAY, ABNORMAL (ICD-793.1) Left upper lobe nodular density was not as apparent on last chest xray from January 2011.  Will continue clinical monitoring, and consider repeat imaging studies if her symptoms progress.  Problem # 4:  CHRONIC RHINITIS (ICD-472.0) Advised her to try using benadryl at bedtime as needed and nasal irrigation to help her get through the allergy season.  Problem # 5:     Medications Added to Medication List This Visit: 1)  Diphenhydramine Hcl 50 Mg Caps (Diphenhydramine hcl) .... One by mouth at bedtime as needed for allergies  Complete Medication List: 1)  Spiriva Handihaler 18 Mcg Caps (Tiotropium bromide monohydrate) .... One puff once daily 2)  Symbicort 160-4.5 Mcg/act Aero (Budesonide-formoterol fumarate) .... 2 puffs two times a day 3)  Proair Hfa 108 (90 Base) Mcg/act Aers (Albuterol sulfate) .... Two puffs up to four times per day as needed 4)  Nasonex 50 Mcg/act Susp (Mometasone furoate) .... 2 sprays once daily as needed 5)  Claritin 10 Mg Tabs (Loratadine) .Marland Kitchen.. 1 by mouth once daily as needed 6)  Furosemide 20 Mg Tabs (Furosemide) .... Take 1 capsule by mouth once every other day 7)  Sular 8.5 Mg Tb24 (Nisoldipine) .... Take 1  tablet by mouth two times a day 8)  Coumadin 5 Mg Tabs (Warfarin sodium) .... Take 1 tablet by mouth once a day or as directed 9)  Coumadin 1 Mg Tabs (Warfarin sodium) .... As directed 10)  Crestor 5 Mg Tabs (Rosuvastatin calcium) .... Take 1 tablet by mouth once a day 11)  Fluoxetine Hcl 20 Mg Tabs (Fluoxetine hcl) .... Take 1 tab by mouth daily 12)  Aricept 10 Mg Tabs (Donepezil hcl) .... Once daily 13)  Gabapentin 100 Mg Caps (Gabapentin) .... Take 1 capsule by mouth every morning and three every evening 14)  Levothyroxine Sodium 88 Mcg Tabs (Levothyroxine sodium) .... Take 1 tablet by mouth once a day 15)  Protonix 40 Mg Tbec (Pantoprazole sodium) .... Once  daily 16)  Premarin 0.3 Mg Tabs (Estrogens conjugated) .... Once daily 17)  Celebrex 100 Mg Caps (Celecoxib) .... One by mouth twice daily 18)  Nitroglycerin 0.3 Mg Subl (Nitroglycerin) .... Take 1 tablet under tongue as directed 19)  Hydrocodone-acetaminophen 5-500 Mg Tabs (Hydrocodone-acetaminophen) .... 2 by mouth q 6 hours as needed 20)  Promethazine Hcl 12.5 Mg Supp (Promethazine hcl) .... Three times a day as needed 21)  Fish Oil 500 Mg Caps (Omega-3 fatty acids) .... Take 1 capsule by mouth twice a day 22)  Folic Acid 1 Mg Tabs (Folic acid) .... Take 1 tablet by mouth once a day 23)  Aerochamber Plus Misc (Spacer/aero-holding chambers) .... Use as directed 24)  Diphenhydramine Hcl 50 Mg Caps (Diphenhydramine hcl) .... One by mouth at bedtime as needed for allergies  Other Orders: Est. Patient Level III (04540) Rehabilitation Referral (Rehab)  Patient Instructions: 1)  Try benadryl 50 mg at bedtime as needed for allergies in place of claritin 2)  Will look into pulmonary rehab referral 3)  Follow up in 3 to 4 months

## 2010-04-25 NOTE — Progress Notes (Signed)
Summary: order  Phone Note From Other Clinic Call back at [phone]   Caller: Patient Caller: [name]  Call For: wert Summary of Call: need to clarify coumdin order Initial call taken by: Rickard Patience,  January 13, 2010 1:59 PM  Follow-up for Phone Call        per mw ok to restart coumadin on monday--[name] at nursing home aware Follow-up by: Philipp Deputy CMA,  January 13, 2010 3:25 PM

## 2010-04-25 NOTE — Miscellaneous (Signed)
Summary: [facility]   Imported By: Sherian Rein 10/10/2009 11:37:41  _____________________________________________________________________  External Attachment:    Type:   Image     Comment:   External Document

## 2010-04-25 NOTE — Progress Notes (Signed)
Summary: OV NOTES/ CXR/ FAX  Phone Note From Other Clinic   Caller: TERESA-NURSE W/ [name] Call For: SOOD Summary of Call: PLEASE FAX OV NOTES/ CXR RESULTS TO: [phone]. CONTACT # IS: [id number] Initial call taken by: Tivis Ringer, CNA,  February 10, 2010 4:11 PM  Follow-up for Phone Call        FYI---called and spoke with teresa at [facility]---she is aware that once the office note is signed off we can send this to them.  she stated that was fine. Randell Loop [facility]  February 10, 2010 4:16 PM   Additional Follow-up for Phone Call Additional follow up Details #1::        Last OV note and CXR report faxed to [name] at the number provided. Additional Follow-up by: Michel Bickers CMA,  February 14, 2010 11:14 AM

## 2010-04-25 NOTE — Progress Notes (Signed)
Summary: On call- phone report hemoptysis  Phone Note Call from Patient   Summary of Call: On call- 01/07/10- Late to chart. I was called in AM yesterday (10/15) by patient at [facility],  reporting small amount of pink heme in sputum x 3 days. Felt more as liquid in throat, not hard cough, chest pain or wheeze. On day 3/3 of antibioitic and on chronic coumadin with recent INR reported in range.  Chilling a little at night, but no fever, sweat or GI upset. She was very conversatonal. I explained possible prolongation of INR by antibiotic, depending on timing. She was absolutely firm that she would not consider going to hospital. I recommended coumadin be held x 2 days, for the weekend, and call Dr Craige Cotta Monday. Initial call taken by: Waymon Budge MD,  January 08, 2010 8:23 PM

## 2010-04-25 NOTE — Progress Notes (Signed)
Summary: speak to vs/ nurse- pna  Phone Note Call from Patient Call back at Home Phone [phone]   Caller: Patient Call For: SOOD Summary of Call: pt requests to speak to nurse or dr sood re: recent pna.- wants recs re: coumadin.  also thanks to dr young and "the other dr who helped on phone "over the weekend". all "very nice".  Initial call taken by: Tivis Ringer, CNA,  January 09, 2010 12:19 PM  Follow-up for Phone Call        Pt just wanted to make Dr. Craige Cotta aware that she spoke to MD on call this weekend and they were very nice. She was started on an antibiotic and was told to stop her coumadin. I then spoke to Claudette NP at nursing home and she staets pt is doing better today, her INR was 1.2 so they are restarting coumadin and will recheck on Wed. Pt will finsih course of omnicef.  Pt just wanted to make sure Dr. Craige Cotta was aware. Carron Curie CMA  January 09, 2010 3:14 PM   Additional Follow-up for Phone Call Additional follow up Details #1::        Ok. Additional Follow-up by: Coralyn Helling MD,  January 11, 2010 11:03 AM

## 2010-04-25 NOTE — Miscellaneous (Signed)
Summary: Orders/[name]  Orders/[name]   Imported By: Sherian Rein 01/20/2010 07:08:48  _____________________________________________________________________  External Attachment:    Type:   Image     Comment:   External Document

## 2010-04-25 NOTE — Assessment & Plan Note (Signed)
Summary: rov 2 wks ///kp   Primary Provider/Referring Provider:  Mur[facility] Hodgkins [facility]  CC:  Pt c/o increased SOB with exertion, night sweats, leg swelling with buring worse at night, "white flashes in front of eyes", and lower backaches.  History of Present Illness: 77 yo female with COPD, nocturnal hypoxemia, rhinitis, abnormal xray and recurrent pneumonia  She was treated for pneumonia with antibiotics in October.  She has improved, but not back to baseline.  She still has a cough with sputum, but less than before.  She denies hemoptysis, fever, or chest pain.  Her sinuses are okay.  She does occasionally get trouble with her swallowing if she eats to fast.  She gets winded easily, but is not having as much wheezing.  She has also been getting some back pain.    Preventive Screening-Counseling & Management  Alcohol-Tobacco     Smoking Status: quit     Packs/Day: 1.5     Year Quit: 17 years ago  Caffeine-Diet-Exercise     Does Patient Exercise: no  Current Medications (verified): 1)  Spiriva Handihaler 18 Mcg Caps (Tiotropium Bromide Monohydrate) .... One Puff Once Daily 2)  Symbicort 160-4.5 Mcg/act  Aero (Budesonide-Formoterol Fumarate) .... 2 Puffs Two Times A Day 3)  Coumadin 5 Mg Tabs (Warfarin Sodium) .... Take 1 Tablet By Mouth Once A Day or As Directed 4)  Coumadin 7.5 Mg Tabs (Warfarin Sodium) .... Take 1 Tablet By Mouth Once A Day On Wednesday and Friday and 5mg  Once Daily 5)  Fluoxetine Hcl 40 Mg Caps (Fluoxetine Hcl) .Marland Kitchen.. 1by Mouth Every Day 6)  Gabapentin 100 Mg Caps (Gabapentin) .... Take 1 Tab By Mouth At Bedtime 7)  Levothyroxine Sodium 88 Mcg Tabs (Levothyroxine Sodium) .... Take 1 Tablet By Mouth Once A Day 8)  Premarin 0.3 Mg Tabs (Estrogens Conjugated) .... Once Daily 9)  Celebrex 100 Mg  Caps (Celecoxib) .... One By Mouth Twice Daily 10)  Nitroglycerin 0.3 Mg Subl (Nitroglycerin) .... Take 1 Tablet Under Tongue As Directed 11)  Folic Acid 1 Mg  Tabs (Folic Acid) .... Take 1 Tablet By Mouth Once A Day 12)  Aerochamber Plus  Misc (Spacer/aero-Holding Chambers) .... Use As Directed 13)  Omeprazole 20 Mg Cpdr (Omeprazole) .... One By Mouth Every Morning 14)  Peridex 0.12 % Soln (Chlorhexidine Gluconate) .... Swish With 15 Ml By Mouth For 30 Secs The Spit--Twice Daily 15)  Zyrtec Allergy 10 Mg Tabs (Cetirizine Hcl) .Marland Kitchen.. 1 By Mouth Every Night At Bedtime 16)  Nisoldipine 8.5 Mg Xr24h-Tab (Nisoldipine) .Marland Kitchen.. 1 Two Times A Day 17)  Patanol 0.1 % Soln (Olopatadine Hcl) .Marland Kitchen.. 1 Drop Each Eye Two Times A Day 18)  Aricept 10 Mg Tabs (Donepezil Hcl) .Marland Kitchen.. 1 At Bedtime 19)  Remeron 15 Mg Tabs (Mirtazapine) .Marland Kitchen.. 1 At Bedtime 20)  Duoneb 0.5-2.5 (3) Mg/50ml Soln (Ipratropium-Albuterol) .Marland Kitchen.. 1 Vial in Nebulizer Every 6 Hrs As Needed 21)  Mucinex Dm 30-600 Mg Xr12h-Tab (Dextromethorphan-Guaifenesin) .Marland Kitchen.. 1 Two Times A Day As Needed 22)  Vitamin D3 2000 Unit Caps (Cholecalciferol) .Marland Kitchen.. 1 Once Daily  Allergies (verified): 1)  ! Minocin (Minocycline Hcl)  Past History:  Past Medical History: Reviewed history from 01/19/2010 and no changes required. Cholelithiasis Congestive heart failure Hypertension Hypothyroidism Carotid vascular disease TIA---coumadin Leg paresthesias Macular degeneration Mild Aortic valve stenosis Cerebrovascular accident, hx of Arthritis Blood in stool Cancer UTI high cholesterol Diverticulitis, hx of GERD Seizure disorder Urinary incontinence Dementia COPD      - PFT  04/09/07 FEV1 1.75(107%), FEV1% 58, TLC 5.21(111%), DLCO 41%  Hypoxemia      - SpO2 86% on RA  @ rest on 12/08/07      - 2 liters continuous flow  Past Surgical History: Reviewed history from 01/12/2009 and no changes required. Hysterectomy ear surgery Tonsillectomy breast biopsy x 2  Cataract extraction  Vital Signs:  Patient profile:   77 year old female Height:      64 inches Weight:      177.4 pounds BMI:     30.56 O2 Sat:      94 %  on 2 L/min pulsed Temp:     97.8 degrees F oral Pulse rate:   82 / minute BP sitting:   142 / 78  (right arm) Cuff size:   regular  Vitals Entered By: Zackery Barefoot CMA (February 10, 2010 1:44 PM)  O2 Flow:  2 L/min pulsed CC: Pt c/o increased SOB with exertion, night sweats, leg swelling with buring worse at night, "white flashes in front of eyes", lower backaches Comments Medications reviewed with patient Verified contact number and pharmacy with patient Zackery Barefoot CMA  February 10, 2010 1:45 PM    Physical Exam  General:  on supplemental oxygen.   Nose:  no sinus tenderness, clear nasal discharge.   Mouth:  no oral lesion Neck:  no JVD.   Lungs:  no wheezing or rales, decreased breath sounds, prolonged exhalation Heart:  regular rhythm, normal rate, and no murmurs.   Extremities:  no edema Neurologic:  normal CN II-XII and strength normal.   Cervical Nodes:  No lymphadenopathy noted Psych:  alert and cooperative; normal mood and affect; normal attention span and concentration   Impression & Recommendations:  Problem # 1:  PNEUMONIA, ORGANISM UNSPECIFIED (ICD-486) She has clinical improvement after recent antibiotic therapy, and I do not think she needs additional antibiotics at present.  Explained that it could take several more weeks before she returns to baseline function.  Will repeat chest xray today to further assess whether her previous infiltrate has improved.  Problem # 2:  COPD (ICD-496) She is to continue her current inhaler regimen.  Problem # 3:  HYPOXEMIA (ICD-799.02) She is to continue on supplmental oxygen.  Problem # 4:  CHRONIC RHINITIS (ICD-472.0) Stable.  Problem # 5:  BACK PAIN (ICD-724.5) Advised her to discuss with primary care.  Medications Added to Medication List This Visit: 1)  Coumadin 5 Mg Tabs (Warfarin sodium) .... Take 1 tablet by mouth once a day or as directed 2)  Coumadin 7.5 Mg Tabs (Warfarin sodium) .... Take 1 tablet by  mouth once a day on wednesday and friday and 5mg  once daily 3)  Fluoxetine Hcl 40 Mg Caps (Fluoxetine hcl) .Marland Kitchen.. 1by mouth every day 4)  Gabapentin 100 Mg Caps (Gabapentin) .... Take 1 tab by mouth at bedtime 5)  Omeprazole 20 Mg Cpdr (Omeprazole) .... One by mouth every morning  Complete Medication List: 1)  Spiriva Handihaler 18 Mcg Caps (Tiotropium bromide monohydrate) .... One puff once daily 2)  Symbicort 160-4.5 Mcg/act Aero (Budesonide-formoterol fumarate) .... 2 puffs two times a day 3)  Coumadin 5 Mg Tabs (Warfarin sodium) .... Take 1 tablet by mouth once a day or as directed 4)  Coumadin 7.5 Mg Tabs (Warfarin sodium) .... Take 1 tablet by mouth once a day on wednesday and friday and 5mg  once daily 5)  Fluoxetine Hcl 40 Mg Caps (Fluoxetine hcl) .Marland Kitchen.. 1by mouth every day 6)  Gabapentin  100 Mg Caps (Gabapentin) .... Take 1 tab by mouth at bedtime 7)  Levothyroxine Sodium 88 Mcg Tabs (Levothyroxine sodium) .... Take 1 tablet by mouth once a day 8)  Premarin 0.3 Mg Tabs (Estrogens conjugated) .... Once daily 9)  Celebrex 100 Mg Caps (Celecoxib) .... One by mouth twice daily 10)  Nitroglycerin 0.3 Mg Subl (Nitroglycerin) .... Take 1 tablet under tongue as directed 11)  Folic Acid 1 Mg Tabs (Folic acid) .... Take 1 tablet by mouth once a day 12)  Aerochamber Plus Misc (Spacer/aero-holding chambers) .... Use as directed 13)  Omeprazole 20 Mg Cpdr (Omeprazole) .... One by mouth every morning 14)  Peridex 0.12 % Soln (Chlorhexidine gluconate) .... Swish with 15 ml by mouth for 30 secs the spit--twice daily 15)  Zyrtec Allergy 10 Mg Tabs (Cetirizine hcl) .Marland Kitchen.. 1 by mouth every night at bedtime 16)  Nisoldipine 8.5 Mg Xr24h-tab (Nisoldipine) .Marland Kitchen.. 1 two times a day 17)  Patanol 0.1 % Soln (Olopatadine hcl) .Marland Kitchen.. 1 drop each eye two times a day 18)  Aricept 10 Mg Tabs (Donepezil hcl) .Marland Kitchen.. 1 at bedtime 19)  Remeron 15 Mg Tabs (Mirtazapine) .Marland Kitchen.. 1 at bedtime 20)  Duoneb 0.5-2.5 (3) Mg/13ml Soln  (Ipratropium-albuterol) .Marland Kitchen.. 1 vial in nebulizer every 6 hrs as needed 21)  Mucinex Dm 30-600 Mg Xr12h-tab (Dextromethorphan-guaifenesin) .Marland Kitchen.. 1 two times a day as needed 22)  Vitamin D3 2000 Unit Caps (Cholecalciferol) .Marland Kitchen.. 1 once daily  Other Orders: Est. Patient Level IV (16109) T-2 View CXR (71020TC)  Patient Instructions: 1)  Chest xray today 2)  Follow up in 3 to 4 months  CXR  Procedure date:  01/19/2010  Findings:      CHEST - 2 VIEW   Comparison: 01/12/2010 and 04/04/2009.   Findings: Hyperinflation suggests COPD.   Midline trachea.  Mild cardiomegaly.  Moderate hiatal hernia. Atherosclerotic transverse aorta.   No pleural effusion or pneumothorax.  The peripheral left upper lobe airspace disease is improved.  No new airspace opacities. Scattered linear areas of probable scarring.  Diffuse interstitial thickening.   IMPRESSION:   1.  Improved left upper lobe airspace disease, most consistent with resolving infection.  Recommend radiographic follow-up until complete clearing. 2.  Underlying COPD/chronic bronchitis. 3.  Moderate hiatal hernia.

## 2010-04-25 NOTE — Assessment & Plan Note (Signed)
Summary: rov ///kp   Primary Provider/Referring Provider:  Dr. Felipa Eth @ [facility]  CC:  2 month followup.  Pt states that she was seen by her PCP about 3 wks ago and dxed with bronchitis.  She was txed with abx (does not know the name).  She states that she is still SOB with exertion and has prod cough with yellow sputum.  Marland Kitchen  History of Present Illness: I saw [name] in follow up for her COPD, rhinitis, abnormal xray and recurrent pneumonia  She was treated for a bronchitis recently, and is now off antibiotics.  She still has some cough and sputum.  She denies hemoptysis or fever.  She has not noticed any problems with her swallowing.  She is not using her proair much.  She has not had a chest xray since her last visit.  Current Medications (verified): 1)  Spiriva Handihaler 18 Mcg Caps (Tiotropium Bromide Monohydrate) .... One Puff Once Daily 2)  Symbicort 160-4.5 Mcg/act  Aero (Budesonide-Formoterol Fumarate) .... 2 Puffs Two Times A Day 3)  Proair Hfa 108 (90 Base) Mcg/act Aers (Albuterol Sulfate) .... Two Puffs Up To Four Times Per Day As Needed 4)  Nasonex 50 Mcg/act  Susp (Mometasone Furoate) .... 2 Sprays Once Daily As Needed 5)  Claritin 10 Mg  Tabs (Loratadine) .Marland Kitchen.. 1 By Mouth Once Daily As Needed 6)  Furosemide 20 Mg Tabs (Furosemide) .... Take 1 Capsule By Mouth Once Every Other Day 7)  Sular 8.5 Mg  Tb24 (Nisoldipine) .... Take 1 Tablet By Mouth Two Times A Day 8)  Coumadin 5 Mg Tabs (Warfarin Sodium) .... Take 1 Tablet By Mouth Once A Day or As Directed 9)  Coumadin 1 Mg  Tabs (Warfarin Sodium) .... As Directed 10)  Crestor 5 Mg Tabs (Rosuvastatin Calcium) .... Take 1 Tablet By Mouth Once A Day 11)  Fluoxetine Hcl 20 Mg  Tabs (Fluoxetine Hcl) .... Take 1 Tab By Mouth Daily 12)  Aricept 10 Mg  Tabs (Donepezil Hcl) .... Once Daily 13)  Gabapentin 100 Mg Caps (Gabapentin) .... Take 1 Capsule By Mouth Every Morning and Three Every Evening 14)  Levothyroxine Sodium 88 Mcg Tabs  (Levothyroxine Sodium) .... Take 1 Tablet By Mouth Once A Day 15)  Protonix 40 Mg  Tbec (Pantoprazole Sodium) .... Once Daily 16)  Premarin 0.3 Mg Tabs (Estrogens Conjugated) .... Once Daily 17)  Celebrex 100 Mg  Caps (Celecoxib) .... One By Mouth Twice Daily 18)  Nitroglycerin 0.3 Mg Subl (Nitroglycerin) .... Take 1 Tablet Under Tongue As Directed 19)  Hydrocodone-Acetaminophen 5-500 Mg  Tabs (Hydrocodone-Acetaminophen) .... 2 By Mouth Q 6 Hours As Needed 20)  Promethazine Hcl 12.5 Mg  Supp (Promethazine Hcl) .... Three Times A Day As Needed 21)  Fish Oil 500 Mg Caps (Omega-3 Fatty Acids) .... Take 1 Capsule By Mouth Twice A Day 22)  Folic Acid 1 Mg Tabs (Folic Acid) .... Take 1 Tablet By Mouth Once A Day  Allergies (verified): 1)  ! Minocin (Minocycline Hcl)  Past History:  Past Medical History: Last updated: 01/12/2009 Cholelithiasis Congestive heart failure Hypertension Hypothyroidism Carotid vascular disease TIA---coumadin Leg paresthesias Macular degeneration Mild Aortic valve stenosis Cerebrovascular accident, hx of Arthritis Blood in stool Cancer UTI high cholesterol Diverticulitis, hx of GERD Seizure disorder Urinary incontinence Dementia COPD      - PFT 04/09/07 FEV1 1.75(107%), FVC 3.03(123%), FEV1% 58, TLC 5.21(111%), DLCO 41%, no BD Hypoxemia      - SpO2 86% on RA @  rest on 12/08/07      - 2 liters continuous flow  Past Surgical History: Last updated: 01/12/2009 Hysterectomy ear surgery Tonsillectomy breast biopsy x 2  Cataract extraction  Family History: Last updated: 11/05/2006 Family History High cholesterol Family History Hypertension Family History of Stroke  Family History of Cardiovascular disorder  Social History: Last updated: 11/05/2006 Single Regular exercise-no Retired Former Smoker Alcohol use-yes Drug use-no  Vital Signs:  Patient profile:   77 year old female Weight:      174.13 pounds O2 Sat:      94 % on Room  air Temp:     97.9 degrees F oral Pulse rate:   80 / minute BP sitting:   126 / 80  (left arm)  Vitals Entered By: Vernie Murders (April 04, 2009 2:01 PM)  O2 Flow:  Room air  Physical Exam  General:  on supplemental oxygen.   Nose:  no sinus tenderness, clear nasal discharge.   Mouth:  no oral lesion Neck:  no JVD.   Lungs:  no wheezing or rales, decreased breath sounds, prolonged exhalation Heart:  regular rhythm, normal rate, and no murmurs.   Abdomen:  soft, nontender Extremities:  no edema Cervical Nodes:  No lymphadenopathy noted   Impression & Recommendations:  Problem # 1:  CHEST XRAY, ABNORMAL (ICD-793.1) Will repeat a chest xray to monitor her left upper lobe lesion.  She again stated that she would not likey want to undergo any interventions for this, but would like to monitor the status of the lesion.  Problem # 2:  COPD (ICD-496) She is to continue on her current inhaler regimen.  I have advised her that she can use her rescue inhaler more frequently if needed.  I will also show her how to use a flutter valve.  Problem # 3:  HYPOXEMIA (ICD-799.02) She is to continue on supplemental oxygen.  Medications Added to Medication List This Visit: 1)  Aerochamber Plus Misc (Spacer/aero-holding chambers) .... Use as directed  Complete Medication List: 1)  Spiriva Handihaler 18 Mcg Caps (Tiotropium bromide monohydrate) .... One puff once daily 2)  Symbicort 160-4.5 Mcg/act Aero (Budesonide-formoterol fumarate) .... 2 puffs two times a day 3)  Proair Hfa 108 (90 Base) Mcg/act Aers (Albuterol sulfate) .... Two puffs up to four times per day as needed 4)  Nasonex 50 Mcg/act Susp (Mometasone furoate) .... 2 sprays once daily as needed 5)  Claritin 10 Mg Tabs (Loratadine) .Marland Kitchen.. 1 by mouth once daily as needed 6)  Furosemide 20 Mg Tabs (Furosemide) .... Take 1 capsule by mouth once every other day 7)  Sular 8.5 Mg Tb24 (Nisoldipine) .... Take 1 tablet by mouth two times a  day 8)  Coumadin 5 Mg Tabs (Warfarin sodium) .... Take 1 tablet by mouth once a day or as directed 9)  Coumadin 1 Mg Tabs (Warfarin sodium) .... As directed 10)  Crestor 5 Mg Tabs (Rosuvastatin calcium) .... Take 1 tablet by mouth once a day 11)  Fluoxetine Hcl 20 Mg Tabs (Fluoxetine hcl) .... Take 1 tab by mouth daily 12)  Aricept 10 Mg Tabs (Donepezil hcl) .... Once daily 13)  Gabapentin 100 Mg Caps (Gabapentin) .... Take 1 capsule by mouth every morning and three every evening 14)  Levothyroxine Sodium 88 Mcg Tabs (Levothyroxine sodium) .... Take 1 tablet by mouth once a day 15)  Protonix 40 Mg Tbec (Pantoprazole sodium) .... Once daily 16)  Premarin 0.3 Mg Tabs (Estrogens conjugated) .... Once daily 17)  Celebrex 100 Mg Caps (Celecoxib) .... One by mouth twice daily 18)  Nitroglycerin 0.3 Mg Subl (Nitroglycerin) .... Take 1 tablet under tongue as directed 19)  Hydrocodone-acetaminophen 5-500 Mg Tabs (Hydrocodone-acetaminophen) .... 2 by mouth q 6 hours as needed 20)  Promethazine Hcl 12.5 Mg Supp (Promethazine hcl) .... Three times a day as needed 21)  Fish Oil 500 Mg Caps (Omega-3 fatty acids) .... Take 1 capsule by mouth twice a day 22)  Folic Acid 1 Mg Tabs (Folic acid) .... Take 1 tablet by mouth once a day 23)  Aerochamber Plus Misc (Spacer/aero-holding chambers) .... Use as directed  Other Orders: Est. Patient Level III (99213) T-2 View CXR (71020TC)  Patient Instructions: 1)  Chest xray today 2)  Will set up for a flutter valve 3)  Follow up in 3 months Prescriptions: AEROCHAMBER PLUS  MISC (SPACER/AERO-HOLDING CHAMBERS) Use as directed  #1 x 0   Entered by:   Michel Bickers CMA   Authorized by:   Coralyn Helling MD   Signed by:   Michel Bickers CMA on 04/04/2009   Method used:   Print then Give to Patient   RxID:   [id number]

## 2010-04-25 NOTE — Assessment & Plan Note (Signed)
Summary: Pulmonary/ ext f/u ov, cxr better p levaquin hemoptysis resolved   Primary Provider/Referring Provider:  Murray Hodgkins [facility]  CC:  Dyspnea- "a little better".  History of Present Illness: 77 yowf quit smoking in 1995 with COPD and chronic respiratory failure on 02 at bedtime and as needed daytime  January 12, 2010  ov  DOE and cough- worse x 1 wk seen by  dr Chilton Si rx with Zpak then coughed up traces of blood stopped coumadin, resolved,  then took omnicef finished 10/20,  still having sweats at night. no def fever, no rigors. Stop fish oil Stop coumadin at first sign of any bleeding  If the mucus gets nastier or running fever again over 100.6  in color rec Levaquin 750 x 5 day use duoneb if needed  Keep follow up appts with Dr Craige Cotta Late add: based on cxr rx x 5 days Levaquin and hold coumdin x 3 days then resume  January 19, 2010 ov Dyspnea- "a little better",   still 02 dep.  Pt denies any significant sore throat, dysphagia, itching, sneezing,  nasal congestion or excess secretions,  fever, chills, sweats, unintended wt loss, pleuritic or exertional cp, hempoptysis, change in activity tolerance  orthopnea pnd or leg swelling Pt also denies any obvious fluctuation in symptoms with weather or environmental change or other alleviating or aggravating factors.       Current Medications (verified): 1)  Spiriva Handihaler 18 Mcg Caps (Tiotropium Bromide Monohydrate) .... One Puff Once Daily 2)  Symbicort 160-4.5 Mcg/act  Aero (Budesonide-Formoterol Fumarate) .... 2 Puffs Two Times A Day 3)  Coumadin 5 Mg Tabs (Warfarin Sodium) .... Take 1 Tablet By Mouth Once A Day or As Directed 4)  Coumadin 2.5 Mg Tabs (Warfarin Sodium) .Marland Kitchen.. 1 By Mouth Every Wednesday 5)  Crestor 5 Mg Tabs (Rosuvastatin Calcium) .... Take 1 Tablet By Mouth Once A Day 6)  Fluoxetine Hcl 40 Mg Caps (Fluoxetine Hcl) .... I1by Mouth Every Day 7)  Gabapentin 100 Mg Caps (Gabapentin) .... Take 1 Capsule By  Mouth Every Morning and Three Every Evening 8)  Levothyroxine Sodium 88 Mcg Tabs (Levothyroxine Sodium) .... Take 1 Tablet By Mouth Once A Day 9)  Premarin 0.3 Mg Tabs (Estrogens Conjugated) .... Once Daily 10)  Celebrex 100 Mg  Caps (Celecoxib) .... One By Mouth Twice Daily 11)  Nitroglycerin 0.3 Mg Subl (Nitroglycerin) .... Take 1 Tablet Under Tongue As Directed 12)  Fish Oil 1200 Mg Caps (Omega-3 Fatty Acids) .Marland Kitchen.. 1 By Mouth Two Times Daily 13)  Folic Acid 1 Mg Tabs (Folic Acid) .... Take 1 Tablet By Mouth Once A Day 14)  Aerochamber Plus  Misc (Spacer/aero-Holding Chambers) .... Use As Directed 15)  Omeprazole 20 Mg Cpdr (Omeprazole) .... One By Mouth Every Am 16)  Peridex 0.12 % Soln (Chlorhexidine Gluconate) .... Swish With 15 Ml By Mouth For 30 Secs The Spit--Twice Daily 17)  Zyrtec Allergy 10 Mg Tabs (Cetirizine Hcl) .Marland Kitchen.. 1 By Mouth Every Night At Bedtime 18)  Nisoldipine 8.5 Mg Xr24h-Tab (Nisoldipine) .Marland Kitchen.. 1 Two Times A Day 19)  Patanol 0.1 % Soln (Olopatadine Hcl) .Marland Kitchen.. 1 Drop Each Eye Two Times A Day 20)  Synthroid 88 Mcg Tabs (Levothyroxine Sodium) .Marland Kitchen.. 1 Every Am 21)  Aricept 10 Mg Tabs (Donepezil Hcl) .Marland Kitchen.. 1 At Bedtime 22)  Remeron 15 Mg Tabs (Mirtazapine) .Marland Kitchen.. 1 At Bedtime 23)  Duoneb 0.5-2.5 (3) Mg/46ml Soln (Ipratropium-Albuterol) .Marland Kitchen.. 1 Vial in Nebulizer Every 6 Hrs As Needed 24)  Mucinex Dm 30-600 Mg Xr12h-Tab (Dextromethorphan-Guaifenesin) .Marland Kitchen.. 1 Two Times A Day As Needed 25)  Vitamin D3 2000 Unit Caps (Cholecalciferol) .Marland Kitchen.. 1 Once Daily  Allergies (verified): 1)  ! Minocin (Minocycline Hcl)  Past History:  Past Medical History: Cholelithiasis Congestive heart failure Hypertension Hypothyroidism Carotid vascular disease TIA---coumadin Leg paresthesias Macular degeneration Mild Aortic valve stenosis Cerebrovascular accident, hx of Arthritis Blood in stool Cancer UTI high cholesterol Diverticulitis, hx of GERD Seizure disorder Urinary  incontinence Dementia COPD      - PFT 04/09/07 FEV1 1.75(107%), FEV1% 58, TLC 5.21(111%), DLCO 41%  Hypoxemia      - SpO2 86% on RA  @ rest on 12/08/07      - 2 liters continuous flow  Vital Signs:  Patient profile:   77 year old female Weight:      175 pounds O2 Sat:      94 % on 2 L/min Temp:     97.7 degrees F oral Pulse rate:   82 / minute BP sitting:   134 / 72  (left arm)  Vitals Entered By: Vernie Murders (January 19, 2010 10:37 AM)  O2 Flow:  2 L/min  Physical Exam  Additional Exam:  chronically ill eldelry wf nad in w/c on 02 wt 176 > 175  HEENT mild turbinate edema.  Oropharynx no thrush or excess pnd or cobblestoning.  No JVD or cervical adenopathy. Mild accessory muscle hypertrophy. Trachea midline, nl thryroid. Chest was hyperinflated by percussion with diminished breath sounds and moderate increased exp time without wheeze. Hoover sign positive at mid inspiration. Regular rate and rhythm without murmur gallop or rub or increase P2 or edema.  Abd: nl excursion. Ext warm without cyanosis or clubbing.    Tests: (1) BMP (METABOL)   Sodium                    136 mEq/L                   135-145   Potassium            [H]  5.5 mEq/L                   3.5-5.1   Chloride                  101 mEq/L                   96-112   Carbon Dioxide            25 mEq/L                    19-32   Glucose                   88 mg/dL                    32-35   BUN                       20 mg/dL                    5-73   Creatinine           [H]  1.4 mg/dL                   2.2-0.2   Calcium  9.5 mg/dL                   1.6-10.9   GFR                       37.69 mL/min                >60  Tests: (2) CBC Platelet w/Diff (CBCD)   White Cell Count     [H]  13.1 K/uL                   4.5-10.5   Red Cell Count            4.74 Mil/uL                 3.87-5.11   Hemoglobin           [L]  11.8 g/dL                   60.4-54.0   Hematocrit                36.7 %                       36.0-46.0   MCV                  [L]  77.4 fl                     78.0-100.0   MCHC                      32.1 g/dL                   98.1-19.1   RDW                  [H]  20.1 %                      11.5-14.6   Platelet Count       [H]  680.0 K/uL                  150.0-400.0   Neutrophil %              62.6 %                      43.0-77.0   Lymphocyte %              23.5 %                      12.0-46.0   Monocyte %                8.5 %                       3.0-12.0   Eosinophils%         [H]  5.1 %                       0.0-5.0   Basophils %               0.3 %                       0.0-3.0   Neutrophill Absolute [H]  8.2 K/uL  1.4-7.7   Lymphocyte Absolute       3.1 K/uL                    0.7-4.0   Monocyte Absolute    [H]  1.1 K/uL                    0.1-1.0  Eosinophils, Absolute                             0.7 K/uL                    0.0-0.7   Basophils Absolute        0.0 K/uL                    0.0-0.1  Tests: (3) Sed Rate (ESR)   Sed Rate             [H]  96 mm/hr                    0-22  CXR  Procedure date:  01/19/2010  Findings:      1.  Improved left upper lobe airspace disease, most consistent with resolving infection.  Recommend radiographic follow-up until complete clearing. 2.  Underlying COPD/chronic bronchitis. 3.  Moderate hiatal hernia.  Impression & Recommendations:  Problem # 1:  CHEST XRAY, ABNORMAL (ICD-793.1)  c/w resolving pna but note also very high esr, needs f/u ov in 2 weeks  ddx includes asp injury, boop, lipoid pna but  stronlgy doubt malignancy given the chronology and improvement by today's films so ok to continue coumadin at low therapeutic levels  Problem # 2:  COPD (ICD-496) relatively well compensated on present regimen  Problem # 3:  GERD (ICD-530.81)  Her updated medication list for this problem includes:    Omeprazole 20 Mg Cpdr (Omeprazole) ..... One by mouth every am  Large HH noted on cxr, diet  reviewed to include no fish oil  Problem # 4:  RENAL INSUFFICIENCY (ICD-588.9)  Stage III  ? related to chronic celebrex with mild hyperkalemia noted, defer w/u to Dr Chilton Si  Orders: Est. Patient Level IV (40102)  Problem # 5:  HYPERLIPIDEMIA (ICD-272.4)     Try off crestor due to legs aching to see if this is the cause   Orders: Est. Patient Level IV (72536)  Medications Added to Medication List This Visit: 1)  Vitamin D3 2000 Unit Caps (Cholecalciferol) .Marland Kitchen.. 1 once daily  Other Orders: TLB-BMP (Basic Metabolic Panel-BMET) (80048-METABOL) TLB-CBC Platelet - w/Differential (85025-CBCD) TLB-Sedimentation Rate (ESR) (85652-ESR) T-2 View CXR (71020TC)  Patient Instructions: 1)  Stop crestor for now to see if aches in legs approve 2)  We will call your cxr report to you  3)  Please schedule a follow-up appointment in 2 weeks, sooner if needed to see Dr Craige Cotta

## 2010-04-27 NOTE — Miscellaneous (Signed)
Summary: Order/[name]  Order/[name]   Imported By: Sherian Rein 03/17/2010 10:12:17  _____________________________________________________________________  External Attachment:    Type:   Image     Comment:   External Document

## 2010-04-27 NOTE — Procedures (Signed)
Summary: Endoscopy/[facility]  Endoscopy/[facility]   Imported By: Sherian Rein 04/19/2010 14:00:39  _____________________________________________________________________  External Attachment:    Type:   Image     Comment:   External Document

## 2010-04-27 NOTE — Procedures (Signed)
Summary: Colonoscopy   Colonoscopy  Procedure date:  12/15/2003  Findings:      Location:  [facility].   Patient Name: [name], [name] MRN: [id number] Procedure Procedures: Colonoscopy CPT: 19147.  Personnel: Endoscopist: Venita Lick. Russella Dar, MD, Clementeen Graham.  Exam Location: Exam performed in Endoscopy Suite. [facility]  Patient Consent: Procedure, Alternatives, Risks and Benefits discussed, consent obtained, from patient.  Indications Symptoms: Hematochezia.  History  Current Medications: Patient is currently taking Coumadin.  Pre-Exam Physical: Performed Dec 15, 2003. Cardio-pulmonary exam WNL. Rectal exam abnormal. HEENT exam , Abdominal exam, Extremity exam, Neurological exam, Mental status exam WNL. Abnormal PE findings include: ext. hemorrhoids.  Exam Exam: Extent of exam reached: Cecum, extent intended: Cecum.  The cecum was identified by appendiceal orifice and IC valve. Colon retroflexion performed. ASA Classification: III. Tolerance: excellent.  Monitoring: Pulse and BP monitoring, Oximetry used. Supplemental O2 given.  Colon Prep Used Miralax for colon prep. Prep results: good.  Sedation Meds: Patient assessed and found to be appropriate for moderate (conscious) sedation. Fentanyl 37.5 given IV. Versed 4 mg. given IV.  Findings - DIVERTICULOSIS: Descending Colon to Sigmoid Colon. Not bleeding. ICD9: Diverticulosis: 562.10. Comments: significant spasm and a tortuous sigmoid colon somewhat limited the exam in this area.  NORMAL EXAM: Cecum to Splenic Flexure.  HEMORRHOIDS: Internal. Size: Small. Not bleeding. Not thrombosed. ICD9: Hemorrhoids, Internal: 455.0.   Assessment  Diagnoses: 562.10: Diverticulosis.  455.6: Hemorrhoids, Internal and  External.   Events  Unplanned Interventions: No intervention was required.  Unplanned Events: There were no complications. Plans Medication Plan: Continue current medications. Hemorrhoidal  Medications: Anusol HC Cream prn,  Hemorrhoidal Medications: Anusol HC Suppositories prn,   Patient Education: Patient given standard instructions for: Diverticulosis. Hemorrhoids.  Disposition: After procedure patient sent to recovery. After recovery patient sent back to hospital.  Scheduling/Referral: Office Visit, to [facility]. Jarold Motto, MD, prn  Referring provider, to Rosanne Sack, MD, Dec 15, 2003.    CC: Murray Hodgkins III, MD  This report was created from the original endoscopy report, which was reviewed and signed by the above listed endoscopist.

## 2010-04-27 NOTE — Assessment & Plan Note (Signed)
Summary: FM HX COLON CANCER.[name].    History of Present Illness Visit Type: Initial Visit Primary GI MD: Sheryn Bison MD FACP FAGA Primary Provider: Murray Hodgkins, MD Chief Complaint: rectal bleeding, BRB on tissue daily; daughter has colon cancer History of Present Illness:   77 year old Caucasian female patient at [facility] referred today for a symptomatic rectal bleeding associated with constipation. She's had 2 negative colonoscopies in 2005 and 2008, except for diverticulosis. She has known rectal stenosis and chronic constipation with excessive straining and local bleeding. She denies abdominal pain, upper GI or hepatobiliary complaints. She is on 18-20 medications listed and reviewed today. These include Coumadin, Celebrex, omeprazole, and MiraLax.   GI Review of Systems    Reports abdominal pain, acid reflux, belching, bloating, and  dysphagia with solids.     Location of  Abdominal pain: lower abdomen.    Denies chest pain, dysphagia with liquids, heartburn, loss of appetite, nausea, vomiting, vomiting blood, weight loss, and  weight gain.      Reports black tarry stools, constipation, hemorrhoids, and  rectal bleeding.     Denies anal fissure, change in bowel habit, diarrhea, diverticulosis, fecal incontinence, heme positive stool, irritable bowel syndrome, jaundice, light color stool, liver problems, and  rectal pain.    Current Medications (verified): 1)  Spiriva Handihaler 18 Mcg Caps (Tiotropium Bromide Monohydrate) .... One Puff Once Daily 2)  Advair Diskus 250-50 Mcg/dose Aepb (Fluticasone-Salmeterol) .Marland Kitchen.. 1 Puff By Mouth Two Times A Day 3)  Coumadin 5 Mg Tabs (Warfarin Sodium) .... Take One Tablet By Mouth Daily Except W,f Take 1.5 Tablet By Mouth Once Daily 4)  Premarin 0.3 Mg Tabs (Estrogens Conjugated) .... Once Daily 5)  Celebrex 100 Mg  Caps (Celecoxib) .... One By Mouth Twice Daily 6)  Folic Acid 1 Mg Tabs (Folic Acid) .... Take 1 Tablet By Mouth Once A Day 7)   Omeprazole 20 Mg Cpdr (Omeprazole) .... One By Mouth Every Morning 8)  Aricept 10 Mg Tabs (Donepezil Hcl) .Marland Kitchen.. 1 At Bedtime 9)  Vitamin D3 2000 Unit Caps (Cholecalciferol) .Marland Kitchen.. 1 Once Daily 10)  Synthroid 88 Mcg Tabs (Levothyroxine Sodium) .... Take One By Mouth Once Daily 11)  Prozac 40 Mg Caps (Fluoxetine Hcl) .... Take One By Mouth Once Daily 12)  Neurontin 100 Mg Caps (Gabapentin) .... Take One By Mouth Once Daily 13)  Sular 8.5 Mg Xr24h-Tab (Nisoldipine) .... Take One By Mouth Two Times A Day 14)  Macular Complete .... Take One By Mouth Two Times A Day 15)  Cetirizine Hcl 10 Mg Tabs (Cetirizine Hcl) .... Take One By Mouth Once Daily 16)  Miralax  Powd (Polyethylene Glycol 3350) .... Take One Packet Mixed in 4-8 Ounces and Drink Daily 17)  Meloxicam 7.5 Mg Tabs (Meloxicam) .... Take 1 Tablet By Mouth Once Daily 18)  Chlorhexidine Gluconate 0.12 % Soln (Chlorhexidine Gluconate) .... Use Two Times A Day 19)  Patanol 0.1 % Soln (Olopatadine Hcl) .... Instill1 Drop Each Eye Two Times A Day 20)  Mirtazapine 15 Mg Tabs (Mirtazapine) .... Take 1 Tablet By Mouth At Bedtime  Allergies (verified): 1)  ! Minocin (Minocycline Hcl) 2)  ! Neosporin 3)  ! * Tetanus Toxoids  Past History:  Past medical, surgical, family and social histories (including risk factors) reviewed for relevance to current acute and chronic problems.  Past Medical History: Reviewed history from 01/19/2010 and no changes required. Cholelithiasis Congestive heart failure Hypertension Hypothyroidism Carotid vascular disease TIA---coumadin Leg paresthesias Macular degeneration Mild Aortic valve stenosis  Cerebrovascular accident, hx of Arthritis Blood in stool Cancer UTI high cholesterol Diverticulitis, hx of GERD Seizure disorder Urinary incontinence Dementia COPD      - PFT 04/09/07 FEV1 1.75(107%), FEV1% 58, TLC 5.21(111%), DLCO 41%  Hypoxemia      - SpO2 86% on RA  @ rest on 12/08/07      - 2 liters  continuous flow  Past Surgical History: Reviewed history from 01/12/2009 and no changes required. Hysterectomy ear surgery Tonsillectomy breast biopsy x 2  Cataract extraction  Family History: Reviewed history from 11/05/2006 and no changes required. Family History High cholesterol Family History Hypertension Family History of Stroke  Family History of Cardiovascular disorder Family History of Colon Cancer:Daughter  Social History: Reviewed history from 04/13/2010 and no changes required. Widowed X3 Lives in [facility] Regular exercise-no Retired Former Smoker quit 1995  Alcohol use-yes moderate Drug use-no Daily Caffeine Use Patient does not get regular exercise.   Review of Systems       The patient complains of allergy/sinus, anxiety-new, arthritis/joint pain, back pain, change in vision, coughing up blood, fatigue, fever, hearing problems, heart murmur, itching, night sweats, shortness of breath, skin rash, sleeping problems, and swelling of feet/legs.  The patient denies blood in urine, breast changes/lumps, confusion, cough, depression-new, fainting, headaches-new, heart rhythm changes, menstrual pain, muscle pains/cramps, nosebleeds, pregnancy symptoms, sore throat, swollen lymph glands, thirst - excessive , urination - excessive , urination changes/pain, urine leakage, vision changes, and voice change.    Vital Signs:  Patient profile:   77 year old female Height:      64 inches Weight:      176.50 pounds BMI:     30.41 Pulse rate:   64 / minute Pulse rhythm:   regular BP sitting:   170 / 90  (left arm) Cuff size:   regular  Vitals Entered By: June McMurray CMA Duncan Dull) (April 18, 2010 1:34 PM)  Physical Exam  General:  Well developed, well nourished, no acute distress.healthy appearing.   Head:  Normocephalic and atraumatic. Eyes:  PERRLA, no icterus.exam deferred to patient's ophthalmologist.   Abdomen:  Soft, nontender and  nondistended. No masses, hepatosplenomegaly or hernias noted. Normal bowel sounds. Rectal:  external nonbleeding hemorrhoids noted. Rectal exam does show rectal stenosis and dilated her rectum as best as possible. There is normal color stool in the rectal vault that is trace guaiac positive after the dilatation. There is no gross blood noted on exam. I cannot feel any perirectal mass is well other abnormalities. Psych:  Alert and cooperative. Normal mood and affect.   Impression & Recommendations:  Problem # 1:  RECTAL BLEEDING (ICD-569.3) Assessment Unchanged Hemorrhoidal bleeding associated with rectal stenosis, straining, and chronic constipation. Hopefully, digital exam and dilation was affected. We will continued daily MiraLax and add Amitiza 8 micrograms twice a day to her regime with b.i.d. Analpram cream as needed. I did order CBC and anemia profile today. She is high risk for colonoscopy, and she does not want another colonoscopy exam. She otherwise seems to be doing well with excellent care by Dr. Murray Hodgkins.  Problem # 2:  OTHER DYSPHAGIA (ICD-787.29) Assessment: Improved  Problem # 3:  COPD (ICD-496) Assessment: Improved  Patient Instructions: 1)  Copy sent to : Murray Hodgkins, MD 2)  Please continue current medications.  3)  Take Amitiza one by mouth two times a day, samples and rx sent. 4)  Use rectal cream two times a day as needed.  5)  The medication list was reviewed and reconciled.  All changed / newly prescribed medications were explained.  A complete medication list was provided to the patient / caregiver. 6)  Constipation and Hemorrhoids brochure given.  7)  Please schedule a follow-up appointment as needed.  Prescriptions: HYDROCORTISONE ACE-PRAMOXINE 2.5-1 % CREA (HYDROCORTISONE ACE-PRAMOXINE) apply to rectum two times a day  #1 tube x 2   Entered by:   Harlow Mares CMA (AAMA)   Authorized by:   Mardella Layman MD [facility]   Signed by:   Harlow Mares CMA  (AAMA) on 04/18/2010   Method used:   Print then Give to Patient   RxID:   [id number] AMITIZA 8 MCG CAPS (LUBIPROSTONE) take one by mouth once daily  #60 x 3   Entered by:   Harlow Mares CMA (AAMA)   Authorized by:   Mardella Layman MD [facility]   Signed by:   Harlow Mares CMA (AAMA) on 04/18/2010   Method used:   Print then Give to Patient   RxID:   [id number] HYDROCORTISONE ACE-PRAMOXINE 2.5-1 % CREA (HYDROCORTISONE ACE-PRAMOXINE) apply to rectum two times a day  #1 tube x 2   Entered by:   Harlow Mares CMA (AAMA)   Authorized by:   Mardella Layman MD [facility]   Signed by:   Harlow Mares CMA (AAMA) on 04/18/2010   Method used:   Electronically to        [facility] (retail)       [address]       [place], [place]  [id number]       Ph: [phone] or [phone]       Fax: [phone]   RxID:   [id number] AMITIZA 8 MCG CAPS (LUBIPROSTONE) take one by mouth once daily  #60 x 3   Entered by:   Harlow Mares CMA (AAMA)   Authorized by:   Mardella Layman MD [facility]   Signed by:   Harlow Mares CMA (AAMA) on 04/18/2010   Method used:   Electronically to        [facility] (retail)       [address]       [place], [place]  [id number]       Ph: [phone] or [phone]       Fax: [phone]   RxID:   [id number]

## 2010-04-27 NOTE — Miscellaneous (Signed)
Summary: Orders/[name]  Orders/[name]   Imported By: Sherian Rein 03/17/2010 10:13:54  _____________________________________________________________________  External Attachment:    Type:   Image     Comment:   External Document

## 2010-04-27 NOTE — Miscellaneous (Signed)
Summary: [name]  [name]   Imported By: Sherian Rein 04/04/2010 09:09:01  _____________________________________________________________________  External Attachment:    Type:   Image     Comment:   External Document

## 2010-05-03 NOTE — Assessment & Plan Note (Signed)
Summary: 6 month return/mhh   Primary Provider/Referring Provider:  Murray Hodgkins, MD  CC:  6 month follow up. pt states her breathing is getting worse w/ exertion. Pt c/o wheezing w/ exertion.  History of Present Illness: 77 yo female with COPD, nocturnal hypoxemia, rhinitis, abnormal xray and recurrent pneumonia  She gets winded with minimal activity.  She has not been using her oxygen consistently.  She has some cough with occasional sputum.  She thinks most of her phlegm is coming from his sinuses and post-nasal drip.  She denies fever.  She has been getting some wheeze.  Her inhalers only help sometimes.     Current Medications (verified): 1)  Spiriva Handihaler 18 Mcg Caps (Tiotropium Bromide Monohydrate) .... One Puff Once Daily 2)  Advair Diskus 250-50 Mcg/dose Aepb (Fluticasone-Salmeterol) .Marland Kitchen.. 1 Puff By Mouth Two Times A Day 3)  Coumadin 5 Mg Tabs (Warfarin Sodium) .... Take One Tablet By Mouth Daily Except W,f Take 1.5 Tablet By Mouth Once Daily 4)  Premarin 0.3 Mg Tabs (Estrogens Conjugated) .... Once Daily 5)  Folic Acid 1 Mg Tabs (Folic Acid) .... Take 1 Tablet By Mouth Once A Day 6)  Omeprazole 20 Mg Cpdr (Omeprazole) .... One By Mouth Every Morning 7)  Aricept 10 Mg Tabs (Donepezil Hcl) .Marland Kitchen.. 1 At Bedtime 8)  Vitamin D3 2000 Unit Caps (Cholecalciferol) .Marland Kitchen.. 1 Once Daily 9)  Synthroid 88 Mcg Tabs (Levothyroxine Sodium) .... Take One By Mouth Once Daily 10)  Prozac 40 Mg Caps (Fluoxetine Hcl) .... Take One By Mouth Once Daily 11)  Neurontin 100 Mg Caps (Gabapentin) .... Take One By Mouth Once Daily 12)  Sular 8.5 Mg Xr24h-Tab (Nisoldipine) .... Take One By Mouth Two Times A Day 13)  Macular Complete .... Take One By Mouth Two Times A Day 14)  Cetirizine Hcl 10 Mg Tabs (Cetirizine Hcl) .... Take One By Mouth Once Daily 15)  Miralax  Powd (Polyethylene Glycol 3350) .... Take One Packet Mixed in 4-8 Ounces and Drink Daily 16)  Meloxicam 7.5 Mg Tabs (Meloxicam) .... Take 1  Tablet By Mouth Once Daily 17)  Chlorhexidine Gluconate 0.12 % Soln (Chlorhexidine Gluconate) .... Use Two Times A Day 18)  Patanol 0.1 % Soln (Olopatadine Hcl) .... Instill1 Drop Each Eye Two Times A Day 19)  Mirtazapine 15 Mg Tabs (Mirtazapine) .... Take 1 Tablet By Mouth At Bedtime 20)  Amitiza 8 Mcg Caps (Lubiprostone) .... Take One By Mouth Once Daily 21)  Hydrocortisone Ace-Pramoxine 2.5-1 % Crea (Hydrocortisone Ace-Pramoxine) .... Apply To Rectum Two Times A Day 22)  Oxygen .... 2 Liters At Night and As Needed  Allergies (verified): 1)  ! Minocin (Minocycline Hcl) 2)  ! Neosporin 3)  ! * Tetanus Toxoids  Past History:  Past Medical History: Cholelithiasis Congestive heart failure Hypertension Hypothyroidism Carotid vascular disease TIA---coumadin Leg paresthesias Macular degeneration Mild Aortic valve stenosis Cerebrovascular accident, hx of Arthritis Blood in stool Cancer UTI high cholesterol Diverticulitis, hx of GERD Seizure disorder Urinary incontinence Dementia COPD      - PFT 04/09/07 FEV1 1.75(107%), FEV1% 58, TLC 5.21(111%), DLCO 41%  Hypoxemia      - SpO2 86% on RA  @ rest on 12/08/07      - 2 liters at night  Past Surgical History: Reviewed history from 01/12/2009 and no changes required. Hysterectomy ear surgery Tonsillectomy breast biopsy x 2  Cataract extraction  Vital Signs:  Patient profile:   77 year old female Height:  64 inches Weight:      178.25 pounds BMI:     30.71 O2 Sat:      98 % on Room air Temp:     97.5 degrees F oral Pulse rate:   65 / minute BP sitting:   114 / 62  (left arm) Cuff size:   regular  Vitals Entered By: Carver Fila (April 24, 2010 1:27 PM)  O2 Flow:  Room air  Serial Vital Signs/Assessments:  Comments: 2:00 PM Ambulatory Pulse Oximetry  Resting; HR__67___    02 Sat__96% on room air___  Lap1 (185 feet)   HR__73___   02 Sat__96% on room air___ Lap2 (185 feet)   HR__74___   02 Sat__95% on  room air___    Lap3 (185 feet)   HR_76____   02 Sat__94% on room air___  _x__Test Completed without Difficulty ___Test Stopped due to:  By: Michel Bickers CMA   CC: 6 month follow up. pt states her breathing is getting worse w/ exertion. Pt c/o wheezing w/ exertion Comments meds and allergies udpated Phone number updated Carver Fila  April 24, 2010 1:27 PM    Physical Exam  General:  normal appearance.   Nose:  pale mucosa, clear drainage Mouth:  no oral lesion Neck:  no JVD.   Lungs:  no wheezing or rales, decreased breath sounds, prolonged exhalation Heart:  regular rhythm, normal rate, and no murmurs.   Extremities:  no edema Neurologic:  normal CN II-XII and strength normal.   Cervical Nodes:  No lymphadenopathy noted Psych:  alert and cooperative; normal mood and affect; normal attention span and concentration   Impression & Recommendations:  Problem # 1:  PNEUMONIA, ORGANISM UNSPECIFIED (ICD-486)  Will repeat chest xray today.  Problem # 2:  COPD (ICD-496) Will continue current inhaler regimen.  Problem # 3:  CHRONIC RESPIRATORY FAILURE (ICD-518.83) She can continue with supplemental oxygen at night.  Problem # 4:  CHRONIC RHINITIS (ICD-472.0)  Will try nasonex for two weeks, then as needed after.  Gave her sample  Medications Added to Medication List This Visit: 1)  Oxygen  .... 2 liters at night and as needed 2)  Nasonex 50 Mcg/act Susp (Mometasone furoate) .... Two sprays once daily for two weeks, then as needed  Complete Medication List: 1)  Spiriva Handihaler 18 Mcg Caps (Tiotropium bromide monohydrate) .... One puff once daily 2)  Advair Diskus 250-50 Mcg/dose Aepb (Fluticasone-salmeterol) .Marland Kitchen.. 1 puff by mouth two times a day 3)  Coumadin 5 Mg Tabs (Warfarin sodium) .... Take one tablet by mouth daily except w,f take 1.5 tablet by mouth once daily 4)  Premarin 0.3 Mg Tabs (Estrogens conjugated) .... Once daily 5)  Folic Acid 1 Mg Tabs (Folic acid) ....  Take 1 tablet by mouth once a day 6)  Omeprazole 20 Mg Cpdr (Omeprazole) .... One by mouth every morning 7)  Aricept 10 Mg Tabs (Donepezil hcl) .Marland Kitchen.. 1 at bedtime 8)  Vitamin D3 2000 Unit Caps (Cholecalciferol) .Marland Kitchen.. 1 once daily 9)  Synthroid 88 Mcg Tabs (Levothyroxine sodium) .... Take one by mouth once daily 10)  Prozac 40 Mg Caps (Fluoxetine hcl) .... Take one by mouth once daily 11)  Neurontin 100 Mg Caps (Gabapentin) .... Take one by mouth once daily 12)  Sular 8.5 Mg Xr24h-tab (Nisoldipine) .... Take one by mouth two times a day 13)  Macular Complete  .... Take one by mouth two times a day 14)  Cetirizine Hcl 10 Mg Tabs (Cetirizine hcl) .Marland KitchenMarland KitchenMarland Kitchen  Take one by mouth once daily 15)  Miralax Powd (Polyethylene glycol 3350) .... Take one packet mixed in 4-8 ounces and drink daily 16)  Meloxicam 7.5 Mg Tabs (Meloxicam) .... Take 1 tablet by mouth once daily 17)  Chlorhexidine Gluconate 0.12 % Soln (Chlorhexidine gluconate) .... Use two times a day 18)  Patanol 0.1 % Soln (Olopatadine hcl) .... Instill1 drop each eye two times a day 19)  Mirtazapine 15 Mg Tabs (Mirtazapine) .... Take 1 tablet by mouth at bedtime 20)  Amitiza 8 Mcg Caps (Lubiprostone) .... Take one by mouth once daily 21)  Hydrocortisone Ace-pramoxine 2.5-1 % Crea (Hydrocortisone ace-pramoxine) .... Apply to rectum two times a day 22)  Oxygen  .... 2 liters at night and as needed 23)  Nasonex 50 Mcg/act Susp (Mometasone furoate) .... Two sprays once daily for two weeks, then as needed  Other Orders: Est. Patient Level IV (11914) T-2 View CXR (71020TC)  Patient Instructions: 1)  Nasonex two sprays once daily for 2 weeks, then as needed  2)  Chest xray today>>will call with results 3)  Follow up in 4 to 6 months   Immunization History:  Influenza Immunization History:    Influenza:  historical (12/24/2009)

## 2010-05-03 NOTE — Letter (Signed)
Summary: [facility]   Imported By: Sherian Rein 04/28/2010 13:56:45  _____________________________________________________________________  External Attachment:    Type:   Image     Comment:   External Document

## 2010-05-03 NOTE — Letter (Signed)
Summary: [facility]   Imported By: Sherian Rein 04/28/2010 14:00:38  _____________________________________________________________________  External Attachment:    Type:   Image     Comment:   External Document

## 2010-05-17 NOTE — Medication Information (Signed)
Summary: Order/[name]  Order/[name]   Imported By: Lester Ingold 05/08/2010 09:30:58  _____________________________________________________________________  External Attachment:    Type:   Image     Comment:   External Document

## 2010-06-29 LAB — DIFFERENTIAL
Basophils Absolute: 0.1 10*3/uL (ref 0.0–0.1)
Basophils Absolute: 0.1 10*3/uL (ref 0.0–0.1)
Basophils Relative: 1 % (ref 0–1)
Basophils Relative: 2 % — ABNORMAL HIGH (ref 0–1)
Eosinophils Absolute: 0.4 10*3/uL (ref 0.0–0.7)
Eosinophils Relative: 4 % (ref 0–5)
Eosinophils Relative: 5 % (ref 0–5)
Lymphocytes Relative: 30 % (ref 12–46)
Lymphocytes Relative: 31 % (ref 12–46)
Monocytes Absolute: 0.6 10*3/uL (ref 0.1–1.0)

## 2010-06-29 LAB — CBC
HCT: 35.1 % — ABNORMAL LOW (ref 36.0–46.0)
HCT: 38.2 % (ref 36.0–46.0)
Hemoglobin: 11.7 g/dL — ABNORMAL LOW (ref 12.0–15.0)
MCHC: 33.2 g/dL (ref 30.0–36.0)
MCV: 83.4 fL (ref 78.0–100.0)
MCV: 83.8 fL (ref 78.0–100.0)
Platelets: 231 10*3/uL (ref 150–400)
RBC: 4.57 MIL/uL (ref 3.87–5.11)
RDW: 15.4 % (ref 11.5–15.5)
WBC: 7.2 10*3/uL (ref 4.0–10.5)

## 2010-06-29 LAB — CROSSMATCH

## 2010-06-29 LAB — COMPREHENSIVE METABOLIC PANEL
AST: 53 U/L — ABNORMAL HIGH (ref 0–37)
Alkaline Phosphatase: 54 U/L (ref 39–117)
BUN: 17 mg/dL (ref 6–23)
CO2: 26 mEq/L (ref 19–32)
Chloride: 105 mEq/L (ref 96–112)
Creatinine, Ser: 0.86 mg/dL (ref 0.4–1.2)
GFR calc Af Amer: >60 mL/min (ref >60)
GFR calc non Af Amer: >60 mL/min (ref >60)
Potassium: 4.3 mEq/L (ref 3.5–5.1)
Total Bilirubin: 0.6 mg/dL (ref 0.3–1.2)

## 2010-06-29 LAB — ABO/RH: ABO/RH(D): A POS

## 2010-06-29 LAB — PROTIME-INR
Prothrombin Time: 20.1 seconds — ABNORMAL HIGH (ref 11.6–15.2)
Prothrombin Time: 23.8 seconds — ABNORMAL HIGH (ref 11.6–15.2)

## 2010-08-08 NOTE — Discharge Summary (Signed)
[name], [name]              ACCOUNT NO.:  [id number]   MEDICAL RECORD NO.:  [id number]          PATIENT TYPE:  INP   LOCATION:  1510                         FACILITY:  [facility]   PHYSICIAN:  Rosalyn Gess. Norins, MD  DATE OF BIRTH:  Sep 03, 1919   DATE OF ADMISSION:  09/17/2007  DATE OF DISCHARGE:                               DISCHARGE SUMMARY   ADDENDUM:  Addendum for the period September 20, 2007 to September 22, 2007.  The patient  continued to do well.  A follow-up chest x-ray performed on September 21, 2007, showed continued clearing of the left lower lobe pneumonia.  Urinalysis was performed which returned as negative for any significant  sign of infection.   With the patient doing well, being more mobile with no respiratory  distress, she is ready for transfer to rehab at [facility].   Please see the full dictated discharge summary for all the patient's  instructions and medications.   The patient may have leave of absence from facility with her sitter for  hair appointment.   The patient's condition at time of addendum dictation is stable and  improved and ready for transfer.      Rosalyn Gess Norins, MD  Electronically Signed     MEN/MEDQ  D:  09/22/2007  T:  09/22/2007  Job:  [id number]

## 2010-08-08 NOTE — Consult Note (Signed)
[name], [name]              ACCOUNT NO.:  [id number]   MEDICAL RECORD NO.:  [id number]          PATIENT TYPE:  INP   LOCATION:  5121                         FACILITY:  [facility]   PHYSICIAN:  Coralyn Helling, MD        DATE OF BIRTH:  1919-04-09   DATE OF CONSULTATION:  09/16/2006  DATE OF DISCHARGE:                                 CONSULTATION   PULMONARY CONSULTATION:   REFERRING PHYSICIAN:  Valerie A. Felicity Coyer, MD   REASON FOR CONSULTATION:  Abnormal chest x-ray.   [name] is an 77 year old female who was admitted on June 20 after  developing symptoms of weakness with fever and a cough.  She also had  sputum production, which she described as reddish to gelatinous in  consistency.  She was also having diarrhea.  She had been started on  Cipro on June 17 but had persistence of symptoms and as a result, she  was admitted to the hospital.  Due to her abdominal complaints and  diarrhea, she had undergone a CT scan of her abdomen, and this was done  on June 20, and this showed lingular airspace disease representing  pneumonia and there was concern for a central obstructing mass.  She was  also noted to have a 9 mm left renal lesion.  She had a chest x-ray on  June 20, which showed the left upper lobe lesion suspicious for  pneumonia.  She had undergone an MRI of her abdomen on June 21, and this  was unrevealing for any abdominal process but did again confirm the  presence of the left upper lobe lesion in her lung with a concern that  this may be more of a mass lesion.  She then had a CT scan of her chest  with contrast on June 22, and this showed consolidative changes with air  bronchograms in addition to a mass-like lesion in the lingular portion  of the left upper lobe and appeared to be actually obstructing the left  upper lobe lingular bronchus and as a result, pulmonary consultation was  obtained.  [name] says that her symptoms currently have improved to  some degree  and she is not having much coughing any more.  She denies  any chest pain.  She denies any headaches or dizziness.  She does have  difficulties with her vision due to macular degeneration.  She has not  had any problems with hoarseness or change in her voice.  She has not  had any recent weight loss.   HER PAST MEDICAL HISTORY:  1. Dementia.  2. Carotid artery disease, status post left carotid artery      endarterectomy.  3. Hyperlipidemia.  4. Previous stroke.  5. Mitral valve prolapse.  6. Hypertension.  7. Hypothyroidism.  8. Macular degeneration.  9. She is legally blind in her left eye.  10.She has hepatomegaly.  11.Breast cancer.  12.Gout.  13.Depression.  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   PAST SURGICAL HISTORY:  Significant for right breast surgery and  abdominal hysterectomy.   SOCIAL HISTORY:  She quit smoking  13 years ago and used to smoke two  packs a day prior to this.  There is no significant alcohol use.   FAMILY HISTORY:  Noncontributory.   CURRENT MEDICATIONS:  1. Aricept 10 mg h.s.  2. Coumadin.  3. Mobic.  4. Synthroid 7.5 mg daily.  5. Neurontin 100 mg t.i.d.  6. Protonix 80 mg daily.  7. Rocephin 1 g daily.  8. Synthroid 88 mcg daily.  9. Zithromax 500 mg daily.  10.Zocor 20 mg daily.   PHYSICAL EXAMINATION:  GENERAL:  She is seen in her hospital room,  appears to be awake and alert and in no acute distress.  VITAL SIGNS:  Blood pressure is 119/66, heart rate 74, oxygen saturation  is 93% on room air.  Temperature is 98.1.  respiratory rate is 20.  HEENT:  Pupils are equal.  There is no sinus tenderness.  There are no  oral lesions and no lymphadenopathy.  HEART:  Regular S1 and S2 with a 2/6 murmur.  CHEST:  She has rales heard most prominently in the mid lung fields on  the left posteriorly.  ABDOMEN:  Soft, nontender, positive bowel sounds.  EXTREMITIES:  No edema, cyanosis, or clubbing.  NEUROLOGIC:  She is awake and alert and able to move  all four  extremities.   WBC is 9, hemoglobin 10.5, hematocrit is 31.6, platelet count is 285.  Sodium is 140, potassium is 3.4, chloride is 107, CO2 is 27, BUN is 5,  creatinine 0.9, glucose of 101.  PT is 26.8, INR of 2.3.  AST is 37, ALT  is 28, ALP is 63, bilirubin 0.5, albumin is 2.1, calcium is 8.1.  Chest  x-ray and CT scan as stated above.   IMPRESSION:  Left upper lobe mass-like lesion with probable surrounding  pneumonitis with possible pneumonia.  She does have a history of lung  abscess and says that she had undergone a bronchoscopy for this  approximately 15 years ago.  Again, it appears that there is a lesion  which is obstructing the takeoff point for the lingular lobe of the left  upper lobe.  This in conjunction with the symptoms of hemoptysis as well  as tobacco use would be concerning for a possible underlying malignancy.  I discussed with this with her at length.  She would like to pursue  confirmation of this diagnosis even though the treatment options for  this may be somewhat limited.   What I would recommend is to continue her on her antibiotics.  I will  hold her Coumadin.  I will follow up on her INR.  Once her INR is below  1.5, I will schedule her for a bronchoscopy for airway inspection as  well as tissue sampling.  I have discussed with her the possible risks  related to bronchoscopy including bleeding, pneumothorax,  infection and nondiagnosis.  She understands these risks and is willing  to proceed.  I will follow along with you during her hospital stay.  However, if she is deemed medically stable for hospital discharge, she  certainly does not need to remain in the hospital to undergo her  bronchoscopy.      Coralyn Helling, MD  Electronically Signed     VS/MEDQ  D:  09/16/2006  T:  09/17/2006  Job:  [id number]

## 2010-08-08 NOTE — Assessment & Plan Note (Signed)
[facility]                             PULMONARY OFFICE NOTE   SYLVAN, LAHM                       MRN:          [id number]  DATE:11/18/2006                            DOB:          01/12/1920    I saw Ms. [name] today in followup for her pneumonia and abnormal chest  x-ray.   She says that her symptoms have been reasonably stable and that she is  starting to feel like she is getting her energy back, although she has  been having a cough with production of yellow sputum as well as nasal  congestion with postnasal drip over the last 2-3 days.  She denies  having any hemoptysis.  She is having any fever, chills or sweats.  She  also denies any chest pains or palpitations.  She says that she does  occasionally get nauseated, but this has been present for quite some  time and she will occasionally have diarrhea, but again, this has been  present for quite some time and has not changed in character.   MEDICATION LIST:  Reviewed.   PHYSICAL EXAMINATION:  She is 167 pounds, temperature 97.5, blood  pressure is 104/66, heart rate is 63 and oxygen saturation is 95% on  room air.  HEENT:  There is no sinus tenderness.  There is clear nasal discharge.  There is mild erythema of the posterior pharynx.  There is no lymphadenopathy.  HEART:  S1 and S2.  CHEST:  No wheezing or rales.  ABDOMEN:  Soft and nontender.  EXTREMITIES:  There is no edema.   Chest x-ray from today shows minimal residual opacity in the lingula;  otherwise, the remaining infiltrate has essentially resolved.   IMPRESSION:  1. Resolved pneumonia.  No further interventions would be necessary      for this.  2. Acute bronchitis.  I will give her a course of azithromycin for 5      days and hopefully this will help alleviate her symptoms.  I have      advised her to seek further medical attention if her symptoms were      to persist   I will follow up with her in approximately  4 months.     Coralyn Helling, MD  Electronically Signed    VS/MedQ  DD: 11/19/2006  DT: 11/20/2006  Job #: [id number]   cc:   Valetta Mole. Swords, MD

## 2010-08-08 NOTE — Discharge Summary (Signed)
[name], [name]              ACCOUNT NO.:  [id number]   MEDICAL RECORD NO.:  [id number]          PATIENT TYPE:  INP   LOCATION:  1510                         FACILITY:  [facility]   PHYSICIAN:  Rosalyn Gess. Norins, MD  DATE OF BIRTH:  [birth year]   DATE OF ADMISSION:  09/17/2007  DATE OF DISCHARGE:  09/20/2007                               DISCHARGE SUMMARY   ADMITTING DIAGNOSIS:  1. Pneumonia, left lower lobe, recurrent.  2. Chronic obstructive pulmonary disease.  3. Hyperlipidemia.  4. Hypothyroid disease.  5. Pleuritic pain, possibly related to pneumonia.  6. Mild dementia.  7. Blindness.  8. Known gastroesophageal reflux disease.  9. History of cerebrovascular accident, on anticoagulation.   DISCHARGE DIAGNOSES:  1. Pneumonia, left lower lobe, recurrent.  2. Chronic obstructive pulmonary disease.  3. Hyperlipidemia.  4. Hypothyroid disease.  5. Pleuritic pain, possibly related to pneumonia.  6. Mild dementia.  7. Blindness.  8. Known gastroesophageal reflux disease.  9. History of cerebrovascular accident, on anticoagulation.   PROCEDURES:  1. PA and lateral chest x-ray:  September 17, 2007, which revealed left      lower lobe airspace disease consistent with pneumonia, mild      increased pulmonary interstitial prominence.  2. Swallowing study with fluoroscopy:  Interpreted by speech pathology      that showed functional oropharyngeal swallow without aspiration or      penetration of any consistency tested.  Speech pathology suspects      primary esophageal dysphasia without patient having consistent      cessation to esophageal residuals.  Recommendation:  Strict      adherence to reflux precautions and multiple small meals with      regular chopped meats, regular thin liquids.   HISTORY OF PRESENT ILLNESS:  The patient is an 77 year old woman with a  history of recurrent left lower lobe pneumonia followed by Dr. Coralyn Helling because of her COPD.  She has history of a CVA  and carotid artery  stenosis as well as aortic stenosis.  The patient was at her baseline of  health until the Sunday of admission when she developed worsening  shortness of breath and back pain, which was worse with coughing and  deep inspiration.  This actually was on Sunday prior to admission.  The  patient waited until Monday, when she presented Dr. Cato Mulligan, who told the  patient she may have a urinary tract infection, ordered a UA and put her  back on antibiotics.  She then presented to the emergency department on  the 24th because of continuous back pain, coughing, shortness of breath  and occasional wheezing  and was subsequently admitted for pneumonia.   Past medical history, family history, social history, medications and  examination are well documented in chart note.   HOSPITAL COURSE:  1. Pneumonia:  The patient was admitted with pneumonia.  She had air      space disease by chest x-ray.  She had been started on oral Avelox,      but was switched on IV Avelox in the hospital.  The patient was  continued on IV Avelox until June 26th.  At that point, she was      thought to be improved and was switched to p.o. Avelox.  She      continued to do well.  She had initially an elevated white blood      count at 15,900 and with therapy and treatment, final CBC was      11,400, showing marked improvement.  With the patient showing      improvement, with no respiratory distress, with resolution of      leukocytosis, with no abnormalities on chest exam, she was thought      to be stable and ready for discharge to the rehab center at [facility], where she can complete her course of Avelox.  2. Chronic anemia:  It is stable at baseline.  Final CBC revealing a      hemoglobin 10.9 grams.  3. Cerebrovascular accident:  Patient with history of carotid artery      disease and aortic stenosis.  She is already on Coumadin and doing      well with no evidence of any new or recurrent  neurologic event.  4. Hypertension is stable.  5. Hypothyroid disease:  Stable with the patient having had a TSH      June 25th at 3.934.  She will continue on her home medications.  6. Mild dementia:  Patient is stable.  She is visually impaired.  She      does seem fairly well oriented.  She does have an attendant with      her.  7. Congestive heart failure:  Patient is euvolemic.  BNP performed      June 25th was at 124.  Chest x-ray did not reveal any significant      evidence of pulmonary edema.  The patient will continue on her home      medications.   DISCHARGE PHYSICAL EXAM:  Temperature was 98.3, blood pressure 150/77,  heart rate 61, respirations 20, O2 sats 98% on 3 liters with no  increased work of breathing.  GENERAL APPEARANCE:  A pleasant elderly woman in no acute distress.  HEENT: Exam without from trauma or abnormality.  Conjunctivae and  sclerae were clear.  Oropharynx without lesions.  NECK: Supple.  CHEST: Patient is moving air well.  I did not appreciate any rales,  wheezes, nor rhonchi were noted.  CARDIOVASCULAR:  2+ radial pulse.  She had a quiet precordium.  She had  a 2/6 systolic murmur heard best at the right sternal border.  ABDOMEN:  Soft.  NEUROLOGIC EXAM:  The patient is awake, alert, oriented to person,  place, time and context.   LABORATORY:  As of June 27th:  Hemoglobin 10.9 grams, white count  11,400, platelet count 365,000.  Final protime with INR of 1.9.  Basic  metabolic panel from June 26th with a sodium of 138, potassium 3.9,  chloride of 102, BUN of 11, creatinine 0.95, glucose was 114.  TSH as  noted.  Sputum culture with gram positive cocci and clusters, a few gram-  negative rods and yeast was noted.  Hemoglobin A1c was 6.4% .  Lipid  profile with a cholesterol of 121, triglycerides of 118, HDL was 20, LDL  was 77.  BNP as noted was 124.   DISCHARGE MEDICATIONS:  The patient will continue all of her home  medications.  She will  continue on Avelox 400 mg p.o. daily for an  additional 5 days.   PATIENT CONDITION:  At time of discharge dictation is stable and ready  for transfer.   Note:  The patient may be delayed in discharge due to the weekend and  absence of social worker at the nursing facility, in which case an  addendum will be dictated on the day of discharge.      Rosalyn Gess Norins, MD  Electronically Signed     MEN/MEDQ  D:  09/20/2007  T:  09/20/2007  Job:  [id number]

## 2010-08-08 NOTE — Op Note (Signed)
[name], [name]              ACCOUNT NO.:  [id number]   MEDICAL RECORD NO.:  [id number]          PATIENT TYPE:  INP   LOCATION:  5121                         FACILITY:  [facility]   PHYSICIAN:  Coralyn Helling, MD        DATE OF BIRTH:  1919-07-05   DATE OF PROCEDURE:  09/20/2006  DATE OF DISCHARGE:                               OPERATIVE REPORT   PROCEDURE:  Bronchoscopy with BAL, brush cytology, and transbronchial  biopsy from the left upper lobe.   HISTORY:  [name] is an 77 year old female who was admitted to [facility] with symptoms of hemoptysis; and she was noted to have a  infiltrate and mass-like lesion in the left upper lobe.  She is  scheduled to undergo bronchoscopy to rule out lung cancer.  The  procedure was explained to the patient.  Risks were detailed including  bleeding, pneumothorax, infection, and on diagnosis.  The patient signed  the consent form.   DESCRIPTION OF PROCEDURE:  She was brought to the bronchoscopy suite.  She was premedicated with a total of 2 mg of Versed and 50 mcg of  fentanyl intravenously and Cetacaine spray was applied to her posterior  pharynx.  I was unable to provide any further sedation or analgesia as  her oxygen saturations dropped to 87% initially, but then improved after  increasing her oxygen flow to 6 liters.   The bronchoscope was entered orally.  The vocal cords were visualized.  There appeared to be a small polyp on the left vocal cord, otherwise,  there was normal motion of the vocal cords.  Then 1% lidocaine was  instilled topically as needed for anesthesia.  The bronchoscope was  entered into the trachea.  The mucosa appeared pale; and there was  increased amount of clear secretions.  The carina was visualized and  appeared normal.  The right main bronchus was then entered.  The right  upper, middle, and lower lobe orifices were all visualized; and there  were no obvious endobronchial lesions.   The  bronchoscope was then entered into the left main bronchus.  The left  lower lobe was visualized without any obvious endobronchial lesions.  Focus was then switched to the left upper and lingular lobes.  There was  no obvious endobronchial lesions there.  Then 50 mL of saline was  instilled into the left lingular lobe with approximately 20 mL of clear  cloudy fluid returned.  Transbronchial biopsy was then obtained using  fluoroscopic guidance from the left lingular lobe, then brush cytology  was obtained from the left lingular lobe.  There was a minimal amount of  bleeding which resolved spontaneously.  The patient remained  hemodynamically stable for the remainder the procedure; and the  bronchoscope was withdrawn.  She was returned to the recovery room in  stable condition.  A chest x-ray is pending at this time.  The specimens  will be sent for cytology, microbiology and surgical pathology.      Coralyn Helling, MD  Electronically Signed     VS/MEDQ  D:  09/20/2006  T:  09/20/2006  Job:  [id number]

## 2010-08-08 NOTE — H&P (Signed)
NAMEKEELEY, SUSSMAN              ACCOUNT NO.:  [id number]   MEDICAL RECORD NO.:  [id number]          PATIENT TYPE:  EMS   LOCATION:  ED                           [facility]ACILITY[facility]   PHYSICIAN:  Michiel Cowboy, MDDATE OF BIRTH:  06-29-1919   DATE OF ADMISSION:  09/17/2007  DATE OF DISCHARGE:                              HISTORY & PHYSICAL   PRIMARY CARE PHYSICIAN:  Valetta Mole. Swords, M.D.   CHIEF COMPLAINT:  Pleuritic back pain on the left.   The patient is an 77 year old female with a history of recurrent left  lower lobe pneumonia followed by Coralyn Helling, M.D., history of COPD,  CVA, and carotid artery stenosis, as well as aortic stenosis.  The  patient was in her baseline of health up until Sunday when she developed  worsening shortness of breath, back pain which was worse with coughing  and deep inspiration.  The pain was mainly on the left.  The patient  waited until Monday when she presented to Dr. Cato Mulligan who told the  patient that she may have urinary tract infection, ordered UA, but per  patient has not put her on antibiotics.  She presents to the emergency  department because of continuous back pain and coughing, shortness of  breath, and occasional wheezes.  Also endorses fevers and chills at  night for the past few days.   PAST MEDICAL HISTORY:  1. Significant for aortic artery stenosis.  2. Hyperlipidemia.  3. History of CVA with carotid artery stenosis for which she is on      Coumadin.  4. CHF.  5. Hypertension.  6. Hypothyroidism.  7. Cardiovascular disease.  8. TIA.  9. Macular degeneration.  10.History of cerebrovascular accident.  11.Arthritis.  12.Diverticulitis.  13.GERD.  14.Urinary incontinence.  15.Mild dementia.   SOCIAL HISTORY:  The patient lives at [facility].  She used to smoke, but quit 15 years ago.  She denies alcohol use or  drug use.   FAMILY HISTORY:  Noncontributory.   ALLERGIES:  The patient states that  she is allergic to MINOCIN,  PREDNISONE, TETANUS.   MEDICATIONS:  1. Per review of records the patient has been actually started on      Avelox 2 days prior by Dr. Cato Mulligan.  2. __________ 8.5 mg p.o. b.i.d.  3. Celebrex 100 mg p.o. b.i.d.  4. Vicodin as needed for pain.  5. Aricept 10 mg p.o. daily.  6. Nasonex 50 2 sprays daily.  7. Symbicort 160/4.5 2 puffs b.i.d.  8. Claritin 10 mg p.o. daily.  9. Proair 2 puffs q.i.d. as needed.  10.Coumadin 5 mg p.o. daily.  11.Crestor 5 mg p.o. daily.  12.Neurontin 100 mg in a.m. and 300 mg nightly.  13.Levothyroxine 88 mcg p.o. daily.  14.Protonix 40 mg p.o. daily.  15.Nitroglycerin as needed.  16.Premarin 0.3 mg p.o. daily.  17.Folic acid 1 mg tablet daily.  18.Question if the patient is on Lasix 20 mg p.o. daily.   PHYSICAL EXAMINATION:  VITAL SIGNS:  Temperature 98.9, [facility] temperature was noted to be 101.5.  Blood pressure 138/86, pulse 76, respirations 18, and saturating 94% on  4 liters and 82% on room air.  GENERAL:  The patient appears to be in no acute distress lying down on  the stretcher.  HEART:  Regular but with ejection murmur noted.  LUNGS:  Occasional expiratory wheezes, but mild decreased breath sounds  bilaterally.  Occasional crackles on the left.  ABDOMEN:  Soft, nontender, and nondistended.  EXTREMITIES:  Lower extremities without edema.  NEUROLOGY:  Intact.  HEENT:  Head atraumatic, dry mucous membranes, and decreased skin  turgor.   LABORATORY DATA:  White blood cell count 50.9, hemoglobin 12.2,  platelets 322.  Sodium 131, potassium 3.4, creatinine 1.3.  UA shows 100  protein but few bacteria.   Chest x-ray showing left lower lobe pneumonia.   EKG showing normal sinus rhythm with Q waves in lead 3 which are  isolated.   ASSESSMENT:  1. This is an 77 year old female with recurrent left lower lobe      pneumonia who presents with another episode of pneumonia.   Etiology      for this is not quite clear.  For right now we will continue on      Avelox.  We will obtain swallow evaluation as this could      potentially be secondary to recurrent aspiration.  We will give      incentive spirometer and guaifenesin, sputum culture, blood      culture.  I would recommend letting Dr. Craige Cotta know that the patient      presented with another episode to the hospital to see if he has any      other insight.  2. Chronic obstructive pulmonary disease.  Continue home medicines.      We will hold off on steroids at this point as the patient has very      minimal wheezing and reportedly has poor reaction to them.  3. Hyperlipidemia.  Continue Crestor.  4. Hypothyroidism.  Check TSH, continue Synthroid.  5. Pleuritic back pain.  In order to allow better aeration, we will      treat with pain management.  We will do Ultram 25 mg p.o. b.i.d.      and p.r.n. Vicodin.  I suspect that back pain is mainly related to      pneumonia.  UA is currently negative.  6. Dementia.  Continue Aricept.  7. Prophylaxis.  Protonix.  8. The patient is already on Coumadin for a history of CVA.  We will      check INR level.   CODE STATUS:  The patient wishes to be DNR/DNI.   PROGNOSIS:  Good.  We will admit to regular floor.      Michiel Cowboy, MD  Electronically Signed     AVD/MEDQ  D:  09/18/2007  T:  09/18/2007  Job:  [id number]   cc:   Valetta Mole. Swords, MD  [address] [place]  [place] [address]

## 2010-08-08 NOTE — Discharge Summary (Signed)
[name], [name]              ACCOUNT NO.:  [id number]   MEDICAL RECORD NO.:  [id number]          PATIENT TYPE:  INP   LOCATION:  5121                         FACILITY:  [facility]   PHYSICIAN:  Valerie A. Felicity Coyer, MDDATE OF BIRTH:  10/06/1919   DATE OF ADMISSION:  09/13/2006  DATE OF DISCHARGE:  09/20/2006                               DISCHARGE SUMMARY   DISCHARGE DIAGNOSES:  1. Left upper lobe pneumonia, question postobstructive, improved with      antibiotic therapy to continue Omnicef orally x4 days for a total      10-day treatment course.  2. Question of __________  obstruction left upper lobe, status post      bronchoscopy, June 27, Dr. Craige Cotta.  No obvious abnormal findings.      Biopsies taken.  Pathology pending.  Outpatient followup as below.  3. Mild dementia.  4. Hypotension with dehydration and acute renal insufficiency on      admission, resolved.  5. Hypertension.  Resume home medications.  6. Hypothyroidism.  7. Gastroesophageal reflux disease.  8. Macular degeneration with left greater than right.  9. Gout.  10.Dyslipidemia.  11.History of cerebrovascular accident on chronic anticoagulation, to      resume Coumadin on September 21, 2006, without bridge.  12.Peripheral vascular disease with history of remote left carotid      endarterectomy.   DISCHARGE MEDICATIONS:  1. Omnicef 300 mg p.o. b.i.d. x4 additional days.  Otherwise      medications are as prior to admission without change and are to      include:  2. Coumadin 5 mg daily or as instructed.  3. Nexium 40 mg once daily.  4. Aricept 10 mg daily.  5. Crestor 5 mg p.o. daily.  6. Synthroid 88 mcg once daily.  7. Neurontin 100 mg t.i.d.  8. Mobic 7.5 mg daily.  9. Lasix 20 mg every other day.  10.Premarin 0.3 mg once daily.  11.Micardis 80 mg once daily.  12.Sular 20 mg once daily.  13.Celexa 20 mg once daily.  14.The patient also takes other supplements including omega-3 at 1,000      mg b.i.d.  15.Citrucel 2 tablets daily.  16.Macular protect multivitamin 1 tablet b.i.d.  17.Folic acid 1 mg once daily.   DISPOSITION:  The patient is discharged back to her assisted living  facility at [facility] assuming that she is medically stable with  hemodynamic and oxygen stability, post bronchoscopy this afternoon.  The  patient is very anxious for discharge home.   HOSPITAL FOLLOW-UP:  Has been arranged with primary care physician, Dr.  Birdie Sons, Tuesday, July 1st at 1:45 p.m. for an office visit to  monitor the resolution of her pneumonia as well as a repeat pro time  check.  Of note, her INR on the day of discharge is 1.4, after holding  her Coumadin for several days.  After bronchoscopy, it is felt by Dr.  Craige Cotta, not to resume her Coumadin until the 28th and agrees with no need  for bridge for full anticoagulation while resuming Coumadin outpatient  follow-up as here.   CONDITION  ON DISCHARGE:  Medically stable and improved.   HOSPITAL COURSE:  1. Left upper lobe pneumonia.  The patient is a pleasant 77 year old      woman with a history of tobacco abuse who presented to the      emergency room complaining of wobbliness, weakness, fever, and      hemoptysis.  Evaluation in the emergency room revealed a question      of UTI as well as left upper lobe infiltrate and a question of a      mass.  A CT chest was performed which could not exclude mass in her      left upper lobe bronchus thus concerning for postobstructive type      pneumonia with hemoptysis in a former smoker.  She was treated with      Rocephin and azithromycin and a pulmonary consult was obtained.      Given these findings in the setting of hemoptysis, the decision was      made to undergo a bronchoscopy with biopsy.  As such, her Coumadin      was held.  She was continually treated with IV antibiotics with      subsequent improvement in her symptoms and no significant hypoxia.      The patient has been anxious  for discharge home, but pending      medical stability.  This has been delayed until after bronchoscopy      which has been done the day of discharge.  Bronchoscopy, per Dr.      Craige Cotta, was relatively unremarkable with no intrabronchial masses      being found.  Biopsies were taken and sent.  Other results clearly      pending at this time.  Dr. Craige Cotta will contact the patient via phone      for followup on these results.  Likewise, continue antibiotics for      what is otherwise presumed community-acquired pneumonia.      Outpatient followup at primary next week for resolution of his      pneumonia and to resume monitoring of Coumadin which has been held      pre procedurally.  2. Other medical issues.  The patient's other medical issues are      chronic and numerous as listed above.  No changes have been made in      her regimen, except as listed.      Valerie A. Felicity Coyer, MD  Electronically Signed     VAL/MEDQ  D:  09/20/2006  T:  09/20/2006  Job:  [id number]

## 2010-08-08 NOTE — Assessment & Plan Note (Signed)
[facility]                             PULMONARY OFFICE NOTE   [name], [name]                       MRN:          [id number]  DATE:09/25/2006                            DOB:          Jul 04, 1919    I called Ms. [name] today with the results of her bronchoscopy.  Her  initial culture results are negative.  Her AFB and fungal cultures are  still pending.  The cytology showed mild acute inflammation and surgical  pathology was negative for malignancy.  Ms. [name] said that she was  doing reasonably well until yesterday when she had an episode of  uncontrolled shaking as well as sweats.  She has since had a change in  her blood pressure medications.  She denies having any fevers.  She had  a chest x-ray done on September 24, 2006 which showed partial resolution of  the left upper lobe and left lingular infiltrates.  What I suggested for  Ms. [name] to do is have a repeat chest x-ray in approximately 2-3  months to further document the resolution of the infiltrate and we would  continue to monitor her AFB and fungal culture results.     Coralyn Helling, MD  Electronically Signed    VS/MedQ  DD: 09/25/2006  DT: 09/26/2006  Job #: [id number]   cc:   Valetta Mole. Swords, MD

## 2010-08-08 NOTE — Assessment & Plan Note (Signed)
[facility]                             PULMONARY OFFICE NOTE   [name], [name]                       MRN:          [id number]  DATE:02/25/2007                            DOB:          Aug 09, 1919    I saw Ms. [name] today for further evaluation of her dyspnea.  She says  that she has been having problems with shortness of breath with exertion  for the last several weeks.  She is having a cough which is productive  of clear sputum as well as occasional wheezing.  She is also complaining  of sinus congestion with post nasal drip as well as reflux symptoms.  She has not had any problems with fever or chills or sweats.  She denies  any purulent sputum or hemoptysis.  She has not had any chest pain,  abdominal pain, nausea, or diarrhea.   CURRENT MEDICATIONS:  1. Aricept 10 mg at bedtime.  2. Premarin 0.3 mg daily.  3. Coumadin.  4. Lasix 20 mg every other day.  5. Folic acid 1 mg daily.  6. Synthroid 88 mcg daily.  7. Crestor 5 mg daily.  8. Fish oil b.i.d.  9. Nexium 40 mg daily.  10.Neurontin 100 mg t.i.d.  11.Celexa 10 mg daily.  12.Celebrex 200 mg daily.   PHYSICAL EXAMINATION:  She is 167 pounds, temperature is 97.7, blood  pressure is 162/90, heart rate is 60, oxygen saturation is 96% on room  air.  HEENT:  There is no sinus tenderness.  There is clear nasal discharge.  There is mild edema of the posterior pharynx.  NECK:  There is no lymphadenopathy.  HEART:  S1/S2.  CHEST:  There was no wheezing or rales.  ABDOMEN:  Soft, nontender.  EXTREMITIES:  No edema.   IMPRESSION:  Dyspnea associated with cough and wheezing as well as  rhinitis with post nasal drip.  I have given her a prescription and  sample for Veramyst and [employer] HFA.  She is to use the Veramyst two  sprays in each nostril with saline and the Pro Air HFA two sprays q.i.d.  p.r.n.  I will also arrange for her to undergo pulmonary function test  to further evaluate  the possibility of  an asthmatic component to her symptoms.  I will then follow up with her  in approximately one month, at which time I will decide if we need to  have repeat imaging studies of her chest.     Coralyn Helling, MD  Electronically Signed    VS/MedQ  DD: 02/26/2007  DT: 02/26/2007  Job #: [id number]   cc:   Valetta Mole. Swords, MD

## 2010-08-11 NOTE — Discharge Summary (Signed)
NAME:  [name], [name]         ACCOUNT NO.:  [id number]   MEDICAL RECORD NO.:  [id number]          PATIENT TYPE:  INP   LOCATION:  0344                         FACILITY:  [facility]   PHYSICIAN:  Juan-Carlos Monguilod, M.D.DATE OF BIRTH:  05-Sep-1919   DATE OF ADMISSION:  12/14/2003  DATE OF DISCHARGE:  12/16/2003                                 DISCHARGE SUMMARY   CHIEF COMPLAINT:  Rectal bleeding.   DISCHARGE DIAGNOSES:  1.  Rectal bleeding in setting of chronic Coumadin anticoagulation.      1.  Gastroenterology consult, Dr. Judie Petit T. Russella Dar.      2.  Colonoscopy, December 15, 2003, Dr. Russella Dar, noting descending colon          and sigmoid colon diverticulosis with no evidence of acute bleeding.          Normal exam, cecum to splenic flexure.  Internal hemorrhoids with no          obvious bleeding and not thrombosed.      3.  Previous Coumadin initially held at hospital admission, resumed at          hospital discharge, previous dose.  Previous aspirin and Celebrex          not resumed at this time per office review at [facility].      4.  Follow up with Dr. Vania Rea. Jarold Motto on an as-needed basis.  2.  Hypokalemia secondary to bowel preparation, resolved with potassium      replacement.      1.  Discharge potassium 3.8.      2.  Recommend basic metabolic panel, next office visit, [facility] office.  3.  Chronic Coumadin in light of history of cerebrovascular accident,      transient ischemic attack, complete carotid artery occlusion.      1.  Coumadin initially held at hospital admission due to acute bleeding,          resumed, per gastroenterology, okay, prior to discharge at previous          home dose of 5 mg daily.      2.  Follow up at [facility] office 3-5 days post discharge          with repeat PT/INR at that time.  4.  Mitral valve prolapse, known history, stable over the course of      hospitalization.  5.  Mild elevated liver  function tests noted over the course of      hospitalization, SGOT initially 44, declined to 41 prior to hospital      discharge.      1.  Follow up at [facility] office, recommend repeat          comprehensive metabolic panel at some point post discharge.  6.  History of cerebrovascular accident and transient ischemic attack with      residual sensory hearing loss.  7.  Hypertension, stable over the course of hospitalization.  8.  Hyperlipidemia.  9.  Hypothyroidism, on chronic Synthroid replacement.  10. Hiatal hernia and gastroesophageal  reflux disease, on proton pump      inhibitor.  11. History of hepatomegaly.  12. History of right breast intraductal papilloma.  13. History of gout.  14. History of depression.   SURGICAL HISTORY:  Surgical history includes:  1.  Hysterectomy.  2.  Benign breast mass removal.  3.  Removal of right breast papilloma.   ALLERGIES LISTED:  TETANUS, NEOSPORIN, MINOCIN.   CODE STATUS:  Code status was full code over the course of hospitalization.   DISCHARGE MEDICATIONS:  1.  Isosorbide mononitrate 30 mg once daily.  2.  Synthroid 0.088 mg 1 tablet daily.  3.  Lasix 20 mg daily in a.m.  4.  Folate 1 mg daily.  5.  Coumadin 5 mg daily in p.m.  6.  Protonix 40 mg daily.  7.  Prozac 20 mg daily.  8.  Zetia 10 mg daily.  9.  Aricept 10 mg daily in p.m.  10. Neurontin 100 mg 3 times daily.  11. Dulcolax laxative therapy over the counter as previous.  12. Nitroglycerin sublingual as needed and as previous.  13. Anusol-HC suppository 1 rectally daily for 5 days and Anusol-HC cream to      external hemorrhoids twice day for 5 days post discharge per      gastroenterology recommendation.  14. K-Dur 10 mEq once daily.  15. Patient requested not to restart previous aspirin and Celebrex until      reviewed at office.   SPECIAL DISCHARGE INSTRUCTIONS:  1.  Disposition:  Discharged back to her independent apartment at      [facility].  2.   Pain management:  Tylenol over the counter.  3.  Activity:  No restriction.  4.  Diet:  Low sodium, low fat, low cholesterol with diverticulosis      precautions.  5.  Followup -- Dr. Jarold Motto, gastroenterology, as needed.  6.  Followup -- [facility] office, next 3-5 days, call [phone]      for appointment.  The patient will need recheck PT/INR and comprehensive      metabolic panel at that time.   CONSULTS OVER THE COURSE OF HOSPITALIZATION:  Dr. Judie Petit T. Russella Dar,  gastroenterology.   PROCEDURES OVER THE COURSE OF HOSPITALIZATION:  Colonoscopy, December 15, 2003, results as above.   DISCHARGE LABORATORIES:  A CBC done December 16, 2003 found WBC 6.7,  hemoglobin 13.1, hematocrit 39.9, platelets 245,000.  Metabolic panel,  December 16, 2003:  Sodium 137, potassium 3.8, chloride 112, CO2 24, BUN 12  and creatinine 0.9.  PT was 17.9 and INR 1.7.   HISTORY OF PRESENT ILLNESS:  [age]-year-old female resides at  independent apartments at [facility] and is followed in primary care by Dr.  Lenon Curt. Chilton Si, [employer] [facility].  She has a known history of  diverticulosis.  On evening prior to admission, noted bright red blood  following bowel movement.  She developed mild abdominal cramping without  nausea and vomiting.  She continued to have occasional blood-streaked stool.  She is on chronic anticoagulation with Coumadin, also taking aspirin and  Celebrex.  Had an additional episode of frank hematochezia post admission.  Last colonoscopy performed by Dr. Jarold Motto approximately 5 years with no  polyps noted, but did have diverticula.  She was admitted for further  evaluation and treatment.   For dictated family history, social history, review of systems, past medical  history, admission medications, physical exam, laboratory data, impressions and plans, see dictated history and physical dated  December 14, 2003.   HOSPITAL COURSE:  PROBLEM #1 - RECTAL  BLEEDING IN SETTING OF CHRONIC  COUMADIN ANTICOAGULATION:  Patient presented as per history of present  illness.  At time of admission, her hemoglobin remained in normal range, her  Coumadin, aspirin, Celebrex held.  She was seen in GI consult by Dr. Russella Dar.  She was prepped and proceeded with colonoscopy, December 15, 2003, with  full report above, but essentially noting diverticulosis, descending to  sigmoid colon, and internal hemorrhoids with no active bleeding observed.  Recommendations are to restart Coumadin, Anusol suppository and cream for  internal and external hemorrhoids, diverticular precautions with her diet.  Recommended reconsideration of continued use of aspirin and Celebrex.  The  patient is restarted at a previous dose of Coumadin.  Aspirin and Celebrex  are not continued at discharge and are to be reviewed at [facility] office.  Patient is instructed regarding dietary diverticulosis  precautions,  Anusol continued as per GI recommendations for her  hemorrhoids.  Follow up with Dr. Jarold Motto post discharge on an as-needed  basis.   PROBLEM #2 - HYPOKALEMIA THOUGHT SECONDARY TO BOWEL PREPARATION:  Post  admission, the patient developed mild hypokalemia at a level of 3.4, this  thought secondary to her bowel prep and multiple stools.  She was  supplemented with potassium and her potassium normalized at discharge, 3.8.  I have resumed her previous Lasix.  I do not note where she was on any  previous potassium supplementation, but I did place her on 10 mEq daily.  She should have office followup of a metabolic panel post discharge to  ensure stability of her potassium and evaluation of continued need for  potassium supplementation.   PROBLEM #3 - CHRONIC COUMADIN:  This in light of previous history of CVA and  known carotid artery occlusion.  Per gastroenterology note, okay to resume  Coumadin which was held during the initial portion of her hospitalization.  I  have restarted this at previous dose of 5 mg daily.  She is to follow up  with [facility] office in the next 3-5 days with recheck PT/INR at  that time.   PROBLEM #4 - MITRAL VALVE PROLAPSE:  This is a known history and remained  stable over the course of hospitalization.   PROBLEM #5 - MILD ELEVATED LIVER FUNCTION TESTS:  The patient has a listed  history of hepatomegaly.  I am unsure of the etiology here, but noted SGOT  on admission of 44.  This trended down to 41 prior to discharge.  Would  follow this post discharge with a repeat comprehensive metabolic panel at  some point post discharge.   PROBLEM  #6 - ALLERGIES LISTED:  Tetanus, Neosporin, Minocin.   PROBLEM #7 - CODE STATUS:  Listed full code over the course of  hospitalization.   DISPOSITION:  Discharged back to [place], independent apartment.   FOLLOWUP:  As above.  ACTIVITY:  Unrestricted.   DISCHARGE DIET:  Low fat, low cholesterol, low sodium, diverticulosis  precautions.   CONDITION AT TIME OF DISCHARGE:  Stable/improved, though with multiple  medical problems as above.   DISCHARGE PROCESS:  Greater than 30 minutes, 8:45 a.m. through 9:30 a.m.      Scharlene Corn   TC/MEDQ  D:  12/16/2003  T:  12/17/2003  Job:  [id number]   cc:   Venita Lick. Russella Dar, M.D. [facility]

## 2010-08-11 NOTE — Assessment & Plan Note (Signed)
[facility]                           GASTROENTEROLOGY OFFICE NOTE   Karen, Newman                  MRN:          [id number]  DATE:02/05/2006                            DOB:          [birth year]    [name] is an 77 year old white female who has been seen in our office  on many occasions because of IBS-type diarrhea, diverticulosis and  hemorrhoidal bleeding.  Her last colonoscopy was in September of 2005 by Dr.  Russella Dar and otherwise was unremarkable.   She comes to the office today complaining of frequent stools, 10-12 a day,  almost every time she sits on the toilet.  Some of her stools are soft and  some are hard, and she has rather persistent rectal bleeding.  She has  abdominal gas, bloating, and lower abdominal cramping, and apparently saw  Dr. Sandria Manly who referred her here after finding some lower abdominal  tenderness.  She has multiple peripheral vascular problems and has carotid  artery occlusion and CNS difficulties.  She denies recent antibiotic use.  She apparently has been on several different antihyperlipidemia drugs which  have caused problems with her liver function tests.  She has had multiple  pregnancies including a hysterectomy, bilateral oophorectomies,  appendectomy, breast biopsy and cataract surgery.  She also in the past had  a lung abscess that required prolonged outpatient antibiotic treatment.  This was in 1995.  Additional medical problems have included hypertensive  cardiovascular disease, chronic depression, peripheral vascular disease and  apparently some mild dementia along with chronic thyroid dysfunction and  macular degeneration.   MEDICATIONS:  She is on at least 15 medications listed and reviewed in her  chart which do include daily Coumadin, Synthroid and Crestor.   SOCIAL HISTORY:  She lives at [facility] and apparently follows a regular  diet.  She denies abuse of alcohol or cigarettes.   FAMILY HISTORY:  Negative for known gastrointestinal problems.   PHYSICAL EXAMINATION:  GENERAL:  The patient is a healthy-appearing white  female in no acute distress.  VITAL SIGNS:  Weight is 172 pounds.  Blood pressure is 142/60, pulse 48 and  regular.  CHEST:  Clear, and I could not appreciate murmurs, gallops or rubs.  She  appeared to be in a regular rhythm.  ABDOMEN:  No distention, organomegaly, masses or tenderness.  Bowel sounds  were normal.  EXTREMITIES:  Unremarkable.  MENTAL STATUS:  Clear.  RECTAL:  Inspection of the rectum showed some external hemorrhoidal tissue.  Rectal exam showed no masses or tenderness, but there was some mucusy heme  present in the rectal vault.   ASSESSMENT:  1. Probable rectal prolapse per patient's history with associated      proctitis and rectal bleeding.  The patient also has a history of mixed      hemorrhoids.  2. Diverticulosis coli and history of irritable bowel syndrome.  It is      certainly possible she has an element of bacterial overgrowth syndrome.  3. Hypertensive cardiovascular disease with peripheral vascular disease      and hyperlipidemia.  4. Multiple neurologic issues followed by Dr. Sandria Manly.  5. History of macular degeneration.  6. Past history of acid reflux, which apparently is not bothering the      patient at his time, and she is on daily Nexium therapy.  7. Status post multiple surgical procedures.  8. History of multiple drug intolerances.  9. History of degenerative arthritis.   RECOMMENDATIONS:  1. Trial of non-absorbable antibiotic in the form of Xifaxan 400 mg t.i.d.      for 10 days, along with align probiotic therapy.  2. Canasa 1 gm suppositories at bedtime.  3. Check screening laboratory parameters.  This will include iron, B-12      and folate levels.  4. The patient refuses proctoscopy exam.  5. Other medications as per Dr. Cato Mulligan.  6. GI followup in several weeks time.     Vania Rea. Jarold Motto, MD,  Caleen Essex, FAGA  Electronically Signed    DRP/MedQ  DD: 02/05/2006  DT: 02/05/2006  Job #: [id number]   cc:   Valetta Mole. Cato Mulligan, MD  Genene Churn. Love, M.D.

## 2010-08-11 NOTE — H&P (Signed)
[place]. [facility]  Patient:    [name], [name] Visit Number: [id number] MRN: [id number]          Service Type: MED Location: [phone] Attending Physician:  Silvestre Mesi Dictated by:   Moshe Salisbury, N.P. Admit Date:  11/21/2000                           History and Physical  DATE OF BIRTH: 1920/03/13  CHIEF COMPLAINT: Vertigo with nausea.  HISTORY OF PRESENT ILLNESS: The patients blood pressure has been fluctuating all day long and goes up when her symptoms are worse.  She is complaining of mild numbness at the left corner of her mouth, nausea, and vertigo, and whenever she moves the vertigo gets worse.  Her EKG shows normal sinus rhythm. She has a history of carotid artery disease with a past stroke.  Her primary care doctor is Dr. Murray Hodgkins and her neurologist is Dr. Sandria Manly, who will be notified of this admission.  She will be admitted to telemetry to deal with her nausea and Dr. Sandria Manly will be checking in on her for neurologic problems.  PAST MEDICAL HISTORY:  1. High blood pressure.  2. Hyperlipidemia.  3. Hypothyroidism.  4. Mitral valve prolapse.  5. Hiatal hernia.  6. GERD.  7. Carotid bruits with carotid artery disease.  8. Diverticulosis.  9. Peripheral edema. 10. Depression. 11. Gout. 12. Cerebrovascular accidents with TIAs. 13. Hepatomegaly. 14. Intraductal papilloma of the right breast.  PAST SURGICAL HISTORY:  1. Hysterectomy.  2. Skin cancers removed.  3. Removal of benign breast mass.  4. Excision of right breast intraductal papilloma.  5. Cataract surgery bilaterally.  6. Right ear operation.  ALLERGIES:  1. TETANUS.  2. NEOSPORIN.  3. MYCIN DRUGS.  CURRENT MEDICATIONS:  1. Prozac 20 mg one q.d.  2. Prilosec 20 mg q.d.  3. Folic acid 1 mg q.d.  4. Synthroid 88 mcg one q.d.  5. Estradiol 0.5 mg one q.d.  6. Lasix 20 mg one q.d.  7. Imdur 30 mg one q.d.  8. Coumadin 6 mg one every evening.  9. Ocuvite one q.d. 10. Enteric-coated aspirin 81 mg one q.d. 11. Macula vitamin. 12. Antioxidants.  FAMILY HISTORY: The patients father died in his 110s of an MI and a stroke. Her mother died of old age in her 79s.  She had a half-sister, whose health was unknown.  She has two sons, one of whom died of a myocardial infarction and one of whom had melanoma.  She has one daughter, alive and well.  SOCIAL HISTORY: The patient is married x 3.  Her first husband was a Insurance account manager in [place], [place].  The first marriage lasted 35 years.  The second marriage lasted nine years.  The third marriage was in 55.  Her husband is presently in the hospital at [facility]. [facility].  She used tobacco for 20 years, one or two packs a day, and quit in 1995.  She uses alcohol socially.  REVIEW OF SYSTEMS: CONSTITUTIONAL: The patient denies fevers but she does have some chills and some sweats.  Her weight has increased lately.  She has some swelling in her lower legs.  She sleeps well.  CARDIOVASCULAR: The patient has had no chest pain lately and has not had palpitations, dyspnea, paroxysmal nocturnal dyspnea, orthopnea, or claudication.  RESPIRATORY: The patient denies coughing, wheezing, or smoking.  EYES:  The patient wears glasses. GASTROINTESTINAL: The patient denied dysphagia.  Has been having some nausea with her vertigo.  She also has occasional heartburn from gastroesophageal reflux disorder, and has had diarrhea recently.  GENITOURINARY: The patient denies dysuria, pyuria, or hematuria.  She has nocturia four to five times at night.  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  SKIN: The patient denies rashes or other skin lesions.  MUSCULOSKELETAL: The patient occasionally gets cramps in her legs and she has been having weak spells lately related to her dizziness. She is walking with a cane today  only and normally does not need one. PSYCHIATRIC: The patient denies depression but she has been under stress lately.  She does not have hallucinations.  NEUROLOGIC: The patient has been feeling faint and dizzy today.  She has had a stroke in the past.  She has not actually fainted but felt like she might.  There is no change in her memory. She states she has had a burning sensation in both lower legs chronically. ENDOCRINE: The patient denies diabetes but does have hypothyroidism. HEMATOLOGIC/LYMPHATIC: The patient both bruises and bleeds easily because she is on Coumadin.  She does not have any swollen lymph nodes.  ALL OTHER SYSTEMS: All other systems are negative.  PHYSICAL EXAMINATION:  VITAL SIGNS: The patient is 77 years old.  Her weight is 161 pounds.  Her blood pressure is 128/80.  Her pulse is 80.  Her respirations are 18.  She is afebrile.  GENERAL: The patient is a well-developed, well-nourished white female.  Her nutrition appears to be good.  She is well-groomed.  PSYCHIATRIC: The patient is pleasant, cooperative, and responds appropriately.  NEUROLOGIC: The patient is oriented to person, place, time, and situation. Cranial nerves 2-12 grossly intact.  HEENT: PERRL, pupils 2 mm in diameter.  Mouth moist.  Oropharynx benign.  NECK: Supple, with midline trachea.  She does have jugular venous distention, bruits, thyromegaly, or hepatojugular reflux.  ADENOPATHY: The patient does not have cervical adenopathy.  CHEST: Clear to auscultation and percussion.  BREAST: Normal contour, without discharge or tenderness.  CARDIAC: Regular rate and rhythm.  S1 and S2 heard.  No murmurs, gallops, rubs, or clicks auscultated.  ABDOMEN: Soft, not obese, not tender.  Positive bowel sounds throughout. Percussion normal.  GENITOURINARY: Nontender over bladder.  EXTREMITIES: Patient moves all extremities x 4.  Strength is 5/5 in upper and lower extremities.  Without ankle  edema.   SKIN: Warm and dry, without jaundice, cyanosis, pallor, or rashes.  Capillary refill brisk.  PSYCHOSOCIAL: The patient came to the office unaccompanied by family.  Her husband is in the hospital at [facility]. [facility] and she was transported to [facility]. [facility] by a security guard from the assisted living facility where she lives.  GAIT/STATION: The patient is unable to walk steadily today due to dizziness. She is using a cane to support herself.  LABORATORY DATA: The patients EKG shows normal sinus rhythm without ectopy, axis normal, QRS normal.  Other laboratory work is pending.  IMPRESSION:  1. Dizziness with vertigo.  2. Nausea.  3. Carotid artery disease.  4. Hypertension.  5. Hyperlipidemia.  6. Hypothyroidism.  7. Mitral valve prolapse.  8. Gastroesophageal reflux disorder.  9. History of strokes and transient ischemic attacks.  PLAN:  1. Admit to telemetry at [facility]. [facility].  2. Treat with IV and antiemetics.  3. Admission laboratory work.  4. Neurologic consultation with Dr. Sandria Manly. Dictated by:   Moshe Salisbury, N.P. Attending Physician:  Silvestre Mesi DD:  11/21/00 TD:  11/22/00 Job: [id number]

## 2010-08-11 NOTE — Op Note (Signed)
[place]. [facility]  Patient:    [name]                         MRN: [id number] Proc. Date: 04/26/99 Adm. Date:  [id number] Attending:  Janalyn Rouse CC:         Warrick Parisian, M.D.                           Operative Report  PREOPERATIVE DIAGNOSIS:  Probable intraductal papilloma of the central duct of he right breast.  POSTOPERATIVE DIAGNOSIS:  Probable intraductal papilloma of the central duct of the right breast.  OPERATION PERFORMED:  Excision of probable intraductal papilloma of the central  duct of the right breast.  SURGEON:  Rose Phi. Maple Hudson, M.D.  ANESTHESIA:  INDICATIONS FOR PROCEDURE:  This patient had a bloody nipple discharge from the  central duct of the right breast and had a preoperative ductogram which showed  papilloma in the central duct going straight down toward the chest wall.  DESCRIPTION OF PROCEDURE:  She was placed on the operating table and the right breast prepped and draped in the usual fashion.  A curvilinear incision that was circumareolar in nature centered at the 6 oclock position of the right breast was then outlined with a marking pencil and the area thoroughly infiltrated with 1%  Xylocaine with Adrenalin.  Incision was made and dissected underneath to the nipple and then I could identify the very dilated duct coming up to the central portion of the nipple.  I then took a core out of the central breast which incorporated the duct in the papilloma.  Hemostasis was obtained with the cautery.  We then did  subcuticular closure of the skin incision with interrupted 4-0 Monocryl and Steri-Strips.  Dressing applied.  Patient transferred to recovery room in satisfactory condition having tolerated the procedure well. DD:  04/26/99 TD:  04/26/99 Job: [id number]

## 2010-08-11 NOTE — Assessment & Plan Note (Signed)
[facility]                                   ON-CALL NOTE   [name], [name]                  MRN:          [id number]  DATE:11/21/2005                            DOB:          10-07-1919    On-call message.   Patient on multiple medicines and has macular degeneration.  Had some  increasing hearing loss and saw Dr. Dorma Russell who put her on high dose  prednisone and Famvir over the last 48 hours.  However, this evening she had  some flushing, feeling warm and red.  The nurse took her blood pressure and  it was 220/100 and something.  She has no significant headache, chest pain,  or shortness of breath.  She is on multiple medications, which she listed  for me but I cannot repeat, including Coumadin for blood pressure, Benicar  which she has not taken yet this evening.  She is on citalopram and had been  on Prozac in the past but states that she is sure that she is not taking  both together.  She otherwise feels okay.  Recommend she go ahead and take  Benicar, stop the prednisone and Famvir temporarily.  Re-check her blood pressure in about an hour.  If it is still extremely high  or has any symptoms, she could call back for evaluation.  Otherwise have her  call Dr. Cato Mulligan to be seen tomorrow in the office.  Patient agrees to this.  Time of telephone call was 11.5 minutes.                                   Neta Mends. Fabian Sharp, MD   WKP/MedQ  DD:  11/21/2005  DT:  11/22/2005  Job #:  [id number]

## 2010-08-11 NOTE — Assessment & Plan Note (Signed)
[facility]                         GASTROENTEROLOGY OFFICE NOTE   [name], [name]                  MRN:          [id number]  DATE:02/28/2006                            DOB:          [birth year]    [name] returns today and did not comply with any of the measures  really recommended on her previous visit of February 05, 2006. She was  prescribed Xifaxan 400 mg t.i.d. which she took for 2 days because her  dentist told her not to use this but to take Augmentin which she took  for 21 days. She apparently did not take probiotic therapy either. She  did use some Canasa suppositories for rectal bleeding and continues to  see bright red blood per rectum. She says her gas, bloating, and crampy  abdominal pain is much relieved.   As from her previous examination, she previously had some proctitis type  stool in her rectal vault. I did a repeat rectal exam today.   Her vital signs today are all entirely normal. She was somewhat  bradycardic with a pulse of 48. She is on at least 15 different  medications today which are listed and again reviewed in her chart and  are unchanged.   I have gone ahead and set [name] up for flexible sigmoidoscopy with  sedation to be sure that she does not have rectal mass or carcinoma  versus proctitis versus hemorrhoidal problems. Again her compliance may  be a problem but we have scheduled this at her convenience next week. If  she does not show for this exam, I will be discharging her from my  medical practice. In the interim, I have given her Xyralid-LP cream to  apply locally to her external hemorrhoids.     Vania Rea. Jarold Motto, MD, Caleen Essex, FAGA  Electronically Signed    DRP/MedQ  DD: 03/01/2006  DT: 03/01/2006  Job #: [id number]   cc:   Valetta Mole. Swords, MD

## 2010-08-11 NOTE — Discharge Summary (Signed)
[facility]  Patient:    [name], [name] Visit Number: [id number] MRN: [id number]          Service Type: MED Location: [phone] Attending Physician:  Silvestre Mesi Dictated by:   Aram Candela. Aleen Campi, M.D. Admit Date:  11/21/2000 Discharge Date: 11/24/2000   CC:         Lenon Curt. Cassell Clement, M.D., primary physician  Genene Churn. Love, M.D.   Discharge Summary  FINAL DIAGNOSES: 1. Vertigo. 2. Intrinsic and extrinsic cerebrovascular disease. 3. Occluded left internal carotid artery. 4. Hyperlipidemia. 5. Hypertension. 6. Mitral valve prolapse. 7. Hypothyroidism.  REASON FOR ADMISSION:  This 77 year old female was seen in our office on the day of admission for follow-up of her peripheral vascular disease, hypertension, and hypercholesterolemia.  She has known carotid artery disease with chronic total occlusion of her left internal carotid artery.  During the office visit, she had the onset of marked vertigo with marked dizziness and nausea with each movement which progressed to the point where she felt that she could not return to her home. She was then admitted to [facility] for observation and evaluation.  FINDINGS ON ADMISSION:  A very unstable gait and essentially unable to walk. She had decreased pulses in her left neck, grade 1/6 holosystolic murmur at the apex but otherwise appeared normal without lateralizing findings and no bruit in her subclavian or vertebral artery distributions.  LABORATORY DATA ON ADMISSION:  Normal electrolytes, CBC, and a mildly elevated BUN with a normal creatinine of 1.0.  Her cardiac enzymes were normal with troponin of less than 0.01.  Her ABGs were abnormal showing mild alkalosis and a low pO2 of 67 and a very mild elevation in her bicarb of 24.3. These are consistent with her chronic findings and unchanged.  She was not dyspneic and showed no signs of respiratory  difficulty.  HOSPITAL COURSE:  On admission, we requested a consult from neurology and she was seen by Genene Churn. Love, M.D.  We notified her primary physician, Lenon Curt. Cassell Clement, M.D., of her admission.  She improved overnight and was essentially stable for discharge on her second hospital day. Her neurology consultant felt that she had evidence for intracranial as well as extracranial cerebrovascular disease which was contributing to her symptoms and recommend continued therapy with Coumadin and aspirin.  A head and brain MRA done on November 22, 2000, showed occlusion of her left internal carotid artery and severe disease involving the distal right internal carotid artery with stenosis of the origin of the right anterior cerebral artery. With collateral filling of the left middle cerebral artery on the right side by way of anterior communicating artery.  After her symptoms gradually improved, she was considered stable for discharge on November 23, 2000, after she received evaluation from physical therapy for her gait problem.  The PT evaluation was not completed until mid afternoon and she was held over for discharge in the early a.m. of November 24, 2000.  DISPOSITION ON DISCHARGE: 1. Medications:    a. Prilosec 20 mg q.d.    b. Folic acid 1 mg q.d.    c. Synthroid 88 mcg q.d.    d. Lasix 20 mg q.d.    e. Estradiol 0.5 mg q.d.    f. Imdur 30 mg q.d.    g. Coumadin 6 mg q.d.    h. Aspirin 81 mg q.d.    i. Ocuvite one q.d. 2. Activity:  No driving until  after her next appointment with Dr. Sandria Manly.    Avoid vigorous physical activity until her follow-up visit. 3. Diet:  Low fat, low cholesterol, no added salt diet. 4. Special instructions:  She was scheduled for follow-up prothrombin time    for Coumadin therapy at the beginning of the following week. 5. Follow-up appointment:  Schedule with Dr. Sandria Manly in three weeks after    discharge and routinely with Aram Candela. Aleen Campi, M.D., and Dr.  Murray Hodgkins.  CONDITION ON DISCHARGE:  Continued improving. Dictated by:   Aram Candela. Aleen Campi, M.D. Attending Physician:  Silvestre Mesi DD:  12/03/00 TD:  12/03/00 Job: [id number]

## 2010-12-21 LAB — COMPREHENSIVE METABOLIC PANEL
ALT: 24
AST: 47 — ABNORMAL HIGH
Alkaline Phosphatase: 90
CO2: 26
Calcium: 8.3 — ABNORMAL LOW
Chloride: 100
GFR calc Af Amer: >60
Glucose, Bld: 126 — ABNORMAL HIGH
Potassium: 3.4 — ABNORMAL LOW
Sodium: 137

## 2010-12-21 LAB — CBC
HCT: 32.8 — ABNORMAL LOW
HCT: 33.2 — ABNORMAL LOW
HCT: 33.9 — ABNORMAL LOW
Hemoglobin: 10.8 — ABNORMAL LOW
Hemoglobin: 10.9 — ABNORMAL LOW
Hemoglobin: 11 — ABNORMAL LOW
MCHC: 32.6
MCV: 83.6
MCV: 84.8
Platelets: 322
Platelets: 322
Platelets: 365
RBC: 3.92
RBC: 3.98
RDW: 16.5 — ABNORMAL HIGH
RDW: 16.7 — ABNORMAL HIGH
RDW: 17 — ABNORMAL HIGH
WBC: 11.4 — ABNORMAL HIGH
WBC: 11.5 — ABNORMAL HIGH
WBC: 15.7 — ABNORMAL HIGH

## 2010-12-21 LAB — URINALYSIS, ROUTINE W REFLEX MICROSCOPIC
Bilirubin Urine: NEGATIVE
Glucose, UA: NEGATIVE
Ketones, ur: NEGATIVE
Leukocytes, UA: NEGATIVE
Nitrite: NEGATIVE
Protein, ur: 100 — AB
Specific Gravity, Urine: 1.008
Specific Gravity, Urine: 1.022
Urobilinogen, UA: 1
pH: 6

## 2010-12-21 LAB — DIFFERENTIAL
Basophils Absolute: 0
Basophils Relative: 0
Eosinophils Absolute: 0.4
Eosinophils Relative: 3
Lymphocytes Relative: 14
Lymphs Abs: 1.6
Monocytes Absolute: 1
Monocytes Relative: 5
Monocytes Relative: 8
Neutro Abs: 13.5 — ABNORMAL HIGH
Neutrophils Relative %: 85 — ABNORMAL HIGH

## 2010-12-21 LAB — BASIC METABOLIC PANEL
GFR calc non Af Amer: 56 — ABNORMAL LOW
Glucose, Bld: 114 — ABNORMAL HIGH
Potassium: 3.9
Sodium: 138

## 2010-12-21 LAB — LIPID PANEL
Cholesterol: 121
LDL Cholesterol: 77
VLDL: 24

## 2010-12-21 LAB — POCT I-STAT, CHEM 8
BUN: 29 — ABNORMAL HIGH
BUN: 29 — ABNORMAL HIGH
Chloride: 100
Creatinine, Ser: 1.3 — ABNORMAL HIGH
Creatinine, Ser: 1.3 — ABNORMAL HIGH
Glucose, Bld: 96
Potassium: 6.7
Sodium: 130 — ABNORMAL LOW
Sodium: 131 — ABNORMAL LOW

## 2010-12-21 LAB — URINE MICROSCOPIC-ADD ON

## 2010-12-21 LAB — CULTURE, RESPIRATORY W GRAM STAIN: Culture: NORMAL

## 2010-12-21 LAB — HEMOGLOBIN A1C: Mean Plasma Glucose: 151

## 2010-12-21 LAB — PROTIME-INR
INR: 1.8 — ABNORMAL HIGH
INR: 2.1 — ABNORMAL HIGH
INR: 2.4 — ABNORMAL HIGH
Prothrombin Time: 27 — ABNORMAL HIGH

## 2010-12-21 LAB — APTT: aPTT: 53 — ABNORMAL HIGH

## 2010-12-21 LAB — POTASSIUM: Potassium: 3.4 — ABNORMAL LOW

## 2010-12-21 LAB — TSH: TSH: 3.934

## 2010-12-21 LAB — EXPECTORATED SPUTUM ASSESSMENT W GRAM STAIN, RFLX TO RESP C

## 2010-12-21 LAB — OSMOLALITY, URINE: Osmolality, Ur: 218 — ABNORMAL LOW

## 2010-12-21 LAB — B-NATRIURETIC PEPTIDE (CONVERTED LAB)
Pro B Natriuretic peptide (BNP): 124 — ABNORMAL HIGH
Pro B Natriuretic peptide (BNP): 270 — ABNORMAL HIGH

## 2010-12-28 ENCOUNTER — Telehealth: Payer: Self-pay | Admitting: Pulmonary Disease

## 2010-12-28 NOTE — Telephone Encounter (Signed)
ATC pt x 3. Line busy. WCB.  

## 2010-12-29 NOTE — Telephone Encounter (Signed)
I spoke with pt and she states the facility where she lives are wanting to giver her a PNA shot today. She states she just had 1 3 years ago. I advised her according to medicare/CDC guidelines she only needs 1 PNA shot after the age of 26 and then she would not need another one. Pt verbalized understanding and had no questions

## 2011-01-09 ENCOUNTER — Ambulatory Visit (INDEPENDENT_AMBULATORY_CARE_PROVIDER_SITE_OTHER)
Admission: RE | Admit: 2011-01-09 | Discharge: 2011-01-09 | Disposition: A | Discharge status: Home / Self Care | Payer: Medicare Other | Source: Ambulatory Visit | Attending: Internal Medicine | Admitting: Internal Medicine

## 2011-01-09 ENCOUNTER — Ambulatory Visit (INDEPENDENT_AMBULATORY_CARE_PROVIDER_SITE_OTHER): Payer: Medicare Other | Admitting: Internal Medicine

## 2011-01-09 ENCOUNTER — Encounter: Payer: Self-pay | Admitting: Internal Medicine

## 2011-01-09 VITALS — BP 122/72 | HR 63 | Temp 97.8°F | Ht 64.0 in | Wt 173.6 lb

## 2011-01-09 DIAGNOSIS — J449 Chronic obstructive pulmonary disease, unspecified: Secondary | ICD-10-CM

## 2011-01-09 DIAGNOSIS — J189 Pneumonia, unspecified organism: Secondary | ICD-10-CM

## 2011-01-09 MED ORDER — LEVOFLOXACIN 750 MG PO TABS: 750.0000 mg | ORAL_TABLET | Freq: Every day | ORAL | Status: AC

## 2011-01-09 MED ORDER — PREDNISONE (PAK) 10 MG PO TABS: ORAL_TABLET | ORAL | Status: AC

## 2011-01-09 MED ORDER — LEVOFLOXACIN 750 MG PO TABS: 750.0000 mg | ORAL_TABLET | Freq: Every day | ORAL | Status: DC

## 2011-01-09 MED ORDER — PREDNISONE (PAK) 10 MG PO TABS: ORAL_TABLET | ORAL | Status: DC

## 2011-01-09 NOTE — Patient Instructions (Addendum)
Prednisone 10 mg take  4 each am x 2 days,   2 each am x 2 days,  1 each am x2days and stop   If condition worsens or we see cxr evidence of pneumonia I will recommend a round of levaquin 750 one daily x 5 days and hold the coumadin on day 2 and 4  Stop advair to see if cough and choking sensation resolve

## 2011-01-09 NOTE — Progress Notes (Signed)
Subjective:     Patient ID: [name], female   DOB: 1919-09-03, 77 y.o.   MRN: [id number]  HPI  73  yowf quit smoking in 1995 with COPD and chronic respiratory failure on 02 at bedtime and as needed daytime   January 12, 2010 ov DOE and cough- worse x 1 wk seen by dr Chilton Si rx with Zpak then coughed up traces of blood stopped coumadin, resolved, then took omnicef finished 10/20, still having sweats at night. no def fever, no rigors. Stop fish oil  Stop coumadin at first sign of any bleeding  If the mucus gets nastier or running fever again over 100.6 in color rec Levaquin 750 x 5 day  use duoneb if needed  Keep follow up appts with Dr Craige Cotta  Late add: based on cxr rx x 5 days Levaquin and hold coumdin x 3 days then resume    01/09/2011 f/u ov/Wert cc episode bad reflux while lying down 10 days prior to ov  ? aspiration abruptly followed by severe  prod cough with clear to yellow sputum.  By day of ov better. No fever. No sob over baseline.   Pt denies any significant sore throat, dysphagia, itching, sneezing, nasal congestion or excess secretions, fever, chills, sweats, unintended wt loss, pleuritic or exertional cp, hempoptysis, change in activity tolerance orthopnea pnd or leg swelling Pt also denies any obvious fluctuation in symptoms with weather or environmental change or other alleviating or aggravating factors.   Past Medical History:  Cholelithiasis  Congestive heart failure  Hypertension  Hypothyroidism  Carotid vascular disease  TIA---coumadin  Leg paresthesias  Macular degeneration  Mild Aortic valve stenosis  Cerebrovascular accident, hx of  Arthritis  Blood in stool  Cancer  UTI  high cholesterol  Diverticulitis, hx of  GERD  Seizure disorder  Urinary incontinence  Dementia  COPD  - PFT 04/09/07 FEV1 1.75(107%), FEV1% 58, TLC 5.21(111%), DLCO 41%  Hypoxemia  - SpO2 86% on RA @ rest on 12/08/07  - 2 liters continuous flow    Review of Systems       Objective:   Physical Exam  chronically ill eldelry wf nad in w/c on 02  wt 176 >   173 01/09/2011  HEENT mild turbinate edema. Oropharynx no thrush or excess pnd or cobblestoning. No JVD or cervical adenopathy. Mild accessory muscle hypertrophy. Trachea midline, nl thryroid. Chest was hyperinflated by percussion with diminished breath sounds and moderate increased exp time without wheeze. Hoover sign positive at mid inspiration. Regular rate and rhythm without murmur gallop or rub or increase P2 or edema. Abd: nl excursion. Ext warm without cyanosis or clubbing.     CXR  01/09/2011 :  1. No acute cardiopulmonary disease.  2. Grossly unchanged findings of bibasilar and upper lung linear heterogeneous opacities favored to represent subsegmental atelectasis or scar.   Assessment:         Plan:

## 2011-01-09 NOTE — Assessment & Plan Note (Signed)
DDX of  difficult airways managment all start with A and  include Adherence, Ace Inhibitors, Acid Reflux, Active Sinus Disease, Alpha 1 Antitripsin deficiency, Anxiety masquerading as Airways dz,  ABPA,  allergy(esp in young), Aspiration (esp in elderly), Adverse effects of DPI,  Active smokers, plus two Bs  = Bronchiectasis and Beta blocker use..and one C= CHF  ? Acid reflux ? Aspiration ? Advair effects > try off and just use duoneb for now and then regroup  See instructions for specific recommendations which were reviewed directly with the patient who was given a copy with highlighter outlining the key components.

## 2011-01-09 NOTE — Assessment & Plan Note (Signed)
No evidence of active infection at present but she is at risk  See instructions for specific recommendations which were reviewed directly with the patient who was given a copy with highlighter outlining the key components.

## 2011-01-10 ENCOUNTER — Telehealth: Payer: Self-pay | Admitting: Internal Medicine

## 2011-01-10 LAB — CBC
HCT: 35.7 — ABNORMAL LOW
Hemoglobin: 11.9 — ABNORMAL LOW
MCHC: 33.3
MCHC: 33.4
MCV: 90.8
Platelets: 275
Platelets: 285
RBC: 3.94
RDW: 15.8 — ABNORMAL HIGH
RDW: 16.1 — ABNORMAL HIGH
WBC: 11.4 — ABNORMAL HIGH
WBC: 9

## 2011-01-10 LAB — URINALYSIS, ROUTINE W REFLEX MICROSCOPIC
Glucose, UA: NEGATIVE
Hgb urine dipstick: NEGATIVE
Ketones, ur: 15 — AB
Nitrite: NEGATIVE
Protein, ur: 100 — AB
Protein, ur: 100 — AB
Specific Gravity, Urine: 1.029
Urobilinogen, UA: 0.2
Urobilinogen, UA: 0.2
pH: 5

## 2011-01-10 LAB — LIPASE, BLOOD: Lipase: 21

## 2011-01-10 LAB — CULTURE, RESPIRATORY W GRAM STAIN: Culture: NO GROWTH

## 2011-01-10 LAB — URINE MICROSCOPIC-ADD ON

## 2011-01-10 LAB — COMPREHENSIVE METABOLIC PANEL
AST: 28
AST: 37
Albumin: 2.1 — ABNORMAL LOW
Albumin: 2.8 — ABNORMAL LOW
Alkaline Phosphatase: 63
BUN: 16
Calcium: 8.8
Chloride: 107
Creatinine, Ser: 0.96
Creatinine, Ser: 1.25 — ABNORMAL HIGH
GFR calc Af Amer: 49 — ABNORMAL LOW
GFR calc Af Amer: >60
Potassium: 3.4 — ABNORMAL LOW
Total Bilirubin: 0.5
Total Bilirubin: 1
Total Protein: 7.2

## 2011-01-10 LAB — COMPREHENSIVE METABOLIC PANEL WITH GFR
ALT: 21
Alkaline Phosphatase: 63
CO2: 23
Chloride: 101
GFR calc non Af Amer: 41 — ABNORMAL LOW
Glucose, Bld: 106 — ABNORMAL HIGH
Potassium: 4.1
Sodium: 132 — ABNORMAL LOW

## 2011-01-10 LAB — BASIC METABOLIC PANEL
CO2: 23
Calcium: 8 — ABNORMAL LOW
Creatinine, Ser: 0.92
GFR calc Af Amer: >60
Glucose, Bld: 107 — ABNORMAL HIGH

## 2011-01-10 LAB — PROTIME-INR
INR: 1.4
INR: 1.6 — ABNORMAL HIGH
INR: 1.6 — ABNORMAL HIGH
INR: 1.9 — ABNORMAL HIGH
INR: 2 — ABNORMAL HIGH
Prothrombin Time: 19.2 — ABNORMAL HIGH
Prothrombin Time: 20.1 — ABNORMAL HIGH
Prothrombin Time: 20.8 — ABNORMAL HIGH
Prothrombin Time: 23 — ABNORMAL HIGH
Prothrombin Time: 23.3 — ABNORMAL HIGH
Prothrombin Time: 26 — ABNORMAL HIGH

## 2011-01-10 LAB — URINE CULTURE
Colony Count: NO GROWTH
Culture: NO GROWTH

## 2011-01-10 LAB — POCT CARDIAC MARKERS
CKMB, poc: 3.4
Myoglobin, poc: >500
Operator id: 282751
Troponin i, poc: <0.05

## 2011-01-10 LAB — AFB STAIN: Acid Fast Smear: NONE SEEN

## 2011-01-10 LAB — AFB CULTURE WITH SMEAR (NOT AT ARMC)
Acid Fast Smear: NONE SEEN
Acid Fast Smear: NONE SEEN

## 2011-01-10 LAB — DIFFERENTIAL
Basophils Absolute: 0
Basophils Absolute: 0.1
Basophils Relative: 0
Basophils Relative: 1
Eosinophils Absolute: 0
Eosinophils Relative: 0
Lymphocytes Relative: 11 — ABNORMAL LOW
Lymphs Abs: 1.3
Monocytes Absolute: 1 — ABNORMAL HIGH
Monocytes Relative: 9
Neutro Abs: 5.2
Neutro Abs: 9.1 — ABNORMAL HIGH
Neutrophils Relative %: 64
Neutrophils Relative %: 80 — ABNORMAL HIGH

## 2011-01-10 LAB — FUNGUS CULTURE W SMEAR
Fungal Smear: NONE SEEN
Fungal Smear: NONE SEEN

## 2011-01-10 LAB — FUNGAL STAIN: Fungal Smear: NONE SEEN

## 2011-01-10 LAB — CLOSTRIDIUM DIFFICILE EIA

## 2011-01-10 LAB — CULTURE, BLOOD (ROUTINE X 2): Culture: NO GROWTH

## 2011-01-10 NOTE — Telephone Encounter (Signed)
Spoke with Arline Asp, nurse at [facility]. She states that she has given the pt prednisone today as directed, and then noticed looking back through her chart that pt had questionable allergy/intolerance to steroids in the past. She states that the pt reports that she has taken prednisone in the past without any problems, and does not recall ever having steroid allergy. Nurse states needs okay from MW for her to continue pred. Please advise, thanks!

## 2011-01-10 NOTE — Telephone Encounter (Signed)
Spoke with Arline Asp and notified of recs per MW. Arline Asp verbalized understanding.

## 2011-01-10 NOTE — Telephone Encounter (Signed)
Ok to give prednisone as prescribed, likely an intolerance to higher longer rx than what I rec

## 2011-01-10 NOTE — Progress Notes (Signed)
Quick Note:  Spoke with pt and notified of results per Dr. Wert. Pt verbalized understanding and denied any questions.  ______ 

## 2011-03-28 ENCOUNTER — Telehealth: Payer: Self-pay | Admitting: Pulmonary Disease

## 2011-03-28 NOTE — Telephone Encounter (Signed)
I spoke with pt and she c/o cough, chest congestion x 4 days. Pt is scheduled to come in and see MW tomorrow at 3:15 to be evaluated

## 2011-03-29 ENCOUNTER — Ambulatory Visit (INDEPENDENT_AMBULATORY_CARE_PROVIDER_SITE_OTHER)
Admission: RE | Admit: 2011-03-29 | Discharge: 2011-03-29 | Disposition: A | Discharge status: Home / Self Care | Payer: Medicare Other | Source: Ambulatory Visit | Attending: Internal Medicine | Admitting: Internal Medicine

## 2011-03-29 ENCOUNTER — Encounter: Payer: Self-pay | Admitting: Internal Medicine

## 2011-03-29 ENCOUNTER — Ambulatory Visit (INDEPENDENT_AMBULATORY_CARE_PROVIDER_SITE_OTHER): Payer: Medicare Other | Admitting: Internal Medicine

## 2011-03-29 VITALS — BP 120/70 | HR 86 | Temp 98.3°F | Ht 64.0 in | Wt 174.0 lb

## 2011-03-29 DIAGNOSIS — J449 Chronic obstructive pulmonary disease, unspecified: Secondary | ICD-10-CM

## 2011-03-29 NOTE — Progress Notes (Signed)
Patient ID: [name], female   DOB: 18-Jul-1919, 78 y.o.   MRN: [id number] Subjective:     Patient ID: [name], female   DOB: [birth year], 78 y.o.   MRN: [id number]  HPI  29  yowf quit smoking in 1995 with GOLD I COPD with DLC0 only 41% (2009) and chronic respiratory failure on 02 at bedtime and as needed daytime   January 12, 2010 ov DOE and cough- worse x 1 wk seen by dr [place] Si rx with Zpak then coughed up traces of blood stopped coumadin, resolved, then took omnicef finished 10/20, still having sweats at night. no def fever, no rigors. Stop fish oil  Stop coumadin at first sign of any bleeding  If the mucus gets nastier or running fever again over 100.6 in color rec Levaquin 750 x 5 day  use duoneb if needed  Keep follow up appts with Dr Craige Cotta  Late add: based on cxr rx x 5 days Levaquin and hold coumdin x 3 days then resume    01/09/2011 f/u ov/[name] cc episode bad reflux while lying down 10 days prior to ov  ? aspiration abruptly followed by severe  prod cough with clear to yellow sputum.  By day of ov better. No fever. No sob over baseline. rec Prednisone 10 mg take  4 each am x 2 days,   2 each am x 2 days,  1 each am x2days and stop  Stop advair to see if cough and choking sensation resolve> no need for any more advair, maintained on spiriva daily and prn neb     03/29/2011 f/u ov/[name] cc Pt c/o prod cough with large amounts of brown/black sputum- onset abupt x 6 days ago. Also has noticed increase in SOB and now using duoneb where prev on spirva monotherapy didn't need any saba at all  Sleeping ok without nocturnal  or early am exacerbation  of respiratory  c/o's or need for noct saba. Also denies any obvious fluctuation of symptoms with weather or environmental changes or other aggravating or alleviating factors except as outlined above    Pt denies any significant sore throat, dysphagia, itching, sneezing, nasal congestion or excess secretions, fever, chills, sweats, unintended  wt loss, pleuritic or exertional cp, hempoptysis, change in activity tolerance orthopnea pnd or leg swelling Pt also denies any obvious fluctuation in symptoms with weather or environmental change or other alleviating or aggravating factors.     Past Medical History:  Cholelithiasis  Congestive heart failure  Hypertension  Hypothyroidism  Carotid vascular disease  TIA---coumadin  Leg paresthesias  Macular degeneration  Mild Aortic valve stenosis  Cerebrovascular accident, hx of  Arthritis  Blood in stool  Cancer  UTI  high cholesterol  Diverticulitis, hx of  GERD  Seizure disorder  Urinary incontinence  Dementia  COPD  - PFT 04/09/07 FEV1 1.75(107%), FEV1% 58, TLC 5.21(111%), DLCO 41%  Hypoxemia  - SpO2 86% on RA @ rest on 12/08/07  - 2 liters continuous flow         Objective:   Physical Exam  chronically ill eldelry wf nad in w/c on 02  wt 176 >   173 01/09/2011 > 174 03/29/2011  HEENT mild turbinate edema. Oropharynx no thrush or excess pnd or cobblestoning. No JVD or cervical adenopathy. Mild accessory muscle hypertrophy. Trachea midline, nl thryroid. Chest was hyperinflated by percussion with diminished breath sounds and moderate increased exp time without wheeze. Hoover sign positive at mid inspiration. Regular rate and rhythm without murmur  gallop or rub or increase P2 or edema. Abd: nl excursion. Ext warm without cyanosis or clubbing.     CXR  03/29/2011 :  Stable chest x-ray with no evidence of acute cardiac or pulmonary process.      Assessment:         Plan:

## 2011-03-29 NOTE — Patient Instructions (Addendum)
Levaquin 750 mg one daily x 5 days Prednisone 10 mg take  4 each am x 2 days,   2 each am x 2 days,  1 each am x2days and stop  Hold coumadin on 1/5 and 1/7 (while on Levaquin) Please remember to go to the x-ray department downstairs for your tests - we will call you with the results when they are available.   If not completely by 2 weeks you need to return here, or if worsen at all while on treatment call immediately

## 2011-03-29 NOTE — Assessment & Plan Note (Addendum)
-   PFT's 02/27/2008 FEV 1  1.7 (104%) ratio 61 and DLC0 41%  C/w GOLD I predominantly emphysematous COPD   Acute flare assoc with tracheobronchitis clinically but no evidence of pna.  Rec short course prednisone and levaquin.  If exac become more freq consider daliresp here but airflow obst chronically does not warrant more aggressive bronchodilator regimen noting she's no worse off advair x almost 3 months

## 2011-03-30 ENCOUNTER — Telehealth: Payer: Self-pay | Admitting: Internal Medicine

## 2011-03-30 NOTE — Progress Notes (Signed)
Quick Note:  Spoke with pt and notified of results per Dr. Wert. Pt verbalized understanding and denied any questions.  ______ 

## 2011-03-30 NOTE — Telephone Encounter (Signed)
Unable to reach Calvert at [facility] and will need to call back on Mon., 04/02/11.

## 2011-04-02 NOTE — Telephone Encounter (Signed)
Spoke to nurse at [facility] and she states they called the wrong office and dr green was going to order the [name]/INR on [name] since he follows and doses her coumadin

## 2011-04-02 NOTE — Telephone Encounter (Signed)
Unclear do we have her on coumadin?  Yes she should call her coumaidn clinic or provider that has her on coumadin and have her level checked sooner when taking a abx.  Just let me know if we need to order.

## 2011-04-02 NOTE — Telephone Encounter (Signed)
I spoke with Nurse and she states that pt was started an Levaquin on 03/29/11 and prednisone on 03/30/11. Rosey Bath wanted to know does Dr. Sherene Sires want pt to have her INR done sooner than 04/18/11 (next scheduled to be done). Pt last INR was done on 03/21/11 and it was 2.6. Pt currently take coumadin 5 mg QD. Will forward to Tammy for recs since MW is not here. Please advise Tammy, thanks

## 2011-05-28 ENCOUNTER — Telehealth: Payer: Self-pay | Admitting: Pulmonary Disease

## 2011-05-28 ENCOUNTER — Telehealth: Payer: Self-pay | Admitting: Internal Medicine

## 2011-05-28 NOTE — Telephone Encounter (Signed)
I advised Ms. Lowell Guitar of the following: Sandrea Hughs, MD 05/28/2011 4:22 PM Signed  I can't comment on someone else's report without seeing the xray and comparing it to baseline but I strongly rec she follow Dr Thomasene Lot advice . Nothing further needed.Carron Curie, CMA

## 2011-05-28 NOTE — Telephone Encounter (Addendum)
[name] ) pt wants Dr wert to go over her xray results they are faxing them to [phone].  Pt really wants dr Rolin Barry opinion on this call [name] at [phone].

## 2011-05-28 NOTE — Telephone Encounter (Signed)
Spoke with Arline Asp with [facility] and she c/o pt having SOB, low grade fever T-99.9, 100.4, 99.1 this morning and a "normal" reading last night. Thick grey mucus with bright red blood traces (streaks) with first specimen this AM and second was w/o blood traces and grayish yellow. Dr. Amanda Cockayne NP ordered: Levaquin 750mg  x 5 days (pt has started) Pred taper 40x2, 20x2, 10x2 then stop (has not started, and will not until she hears back from [facility]) PT/INR is order for every 3 days while on abx.   [facility] [phone] [facility] 1st floor Nurses station  Caller Laural Golden   CXR report received and placed in MW's "look at". Please advise, thanks.

## 2011-05-28 NOTE — Telephone Encounter (Signed)
Spoke to cindy and the call was about pt did not want to take the meds ordered until our office called back and let her know that we would make dr wert aware of what she had been given by dr green told cindy to let pt know dr wert is aware

## 2011-05-28 NOTE — Telephone Encounter (Signed)
I can't comment on someone else's report without seeing the xray and comparing it to baseline but I strongly rec she follow Dr Thomasene Lot advice  c

## 2011-06-07 ENCOUNTER — Telehealth: Payer: Self-pay | Admitting: Pulmonary Disease

## 2011-06-07 NOTE — Telephone Encounter (Signed)
Ok to take daliresp

## 2011-06-07 NOTE — Telephone Encounter (Signed)
Called and spoke with pt. Pt states she is currently at [facility] and the PA there has prescribed her  Daliresp.  Pt took 1 tab today and decided she wanted to check with MW first to make sure it was ok for her to take this med.  MW, please advise. Thank you!!

## 2011-06-08 NOTE — Telephone Encounter (Signed)
lmomtcb x1 

## 2011-06-11 NOTE — Telephone Encounter (Signed)
ATC x 3. Line busy. WCB.  

## 2011-06-12 NOTE — Telephone Encounter (Signed)
I spoke with pt and is aware okay to take the daliresp, nothing further was needed

## 2011-06-20 ENCOUNTER — Other Ambulatory Visit: Payer: Self-pay | Admitting: Internal Medicine

## 2011-06-20 DIAGNOSIS — R634 Abnormal weight loss: Secondary | ICD-10-CM

## 2011-06-20 DIAGNOSIS — C8175 Other classical Hodgkin lymphoma, lymph nodes of inguinal region and lower limb: Secondary | ICD-10-CM

## 2011-06-22 ENCOUNTER — Other Ambulatory Visit: Payer: Medicare Other

## 2011-06-22 ENCOUNTER — Ambulatory Visit
Admission: RE | Admit: 2011-06-22 | Discharge: 2011-06-22 | Disposition: A | Discharge status: Home / Self Care | Payer: Medicare Other | Source: Ambulatory Visit | Attending: Internal Medicine | Admitting: Internal Medicine

## 2011-06-22 DIAGNOSIS — C8195 Hodgkin lymphoma, unspecified, lymph nodes of inguinal region and lower limb: Secondary | ICD-10-CM

## 2011-06-22 DIAGNOSIS — R634 Abnormal weight loss: Secondary | ICD-10-CM

## 2011-06-22 DIAGNOSIS — C8175 Other classical Hodgkin lymphoma, lymph nodes of inguinal region and lower limb: Secondary | ICD-10-CM

## 2011-06-22 MED ORDER — IOHEXOL 300 MG/ML  SOLN
75.0000 mL | Freq: Once | INTRAMUSCULAR | Status: AC | PRN
  Administered 2011-06-22: 75 mL via INTRAVENOUS

## 2011-06-25 ENCOUNTER — Telehealth: Payer: Self-pay | Admitting: Pulmonary Disease

## 2011-06-25 NOTE — Telephone Encounter (Signed)
lmomtcb x1 for [name]   I spoke with pt and she stated she had a CT scan done on Friday and a mass was found on the R lung and needed to come in and see VS. I put pt on schedule to come in and se him 07/03/11 at 1:30. Nothing further was needed

## 2011-06-25 NOTE — Telephone Encounter (Signed)
Patient calling back.  Patient wants to be called back [phone]

## 2011-07-03 ENCOUNTER — Ambulatory Visit (INDEPENDENT_AMBULATORY_CARE_PROVIDER_SITE_OTHER): Payer: Medicare Other | Admitting: Pulmonary Disease

## 2011-07-03 ENCOUNTER — Encounter: Payer: Self-pay | Admitting: Pulmonary Disease

## 2011-07-03 VITALS — BP 132/72 | HR 65 | Temp 97.8°F | Ht 64.5 in | Wt 170.6 lb

## 2011-07-03 DIAGNOSIS — J449 Chronic obstructive pulmonary disease, unspecified: Secondary | ICD-10-CM

## 2011-07-03 DIAGNOSIS — R911 Solitary pulmonary nodule: Secondary | ICD-10-CM

## 2011-07-03 DIAGNOSIS — J9610 Chronic respiratory failure, unspecified whether with hypoxia or hypercapnia: Secondary | ICD-10-CM

## 2011-07-03 NOTE — Patient Instructions (Signed)
Will schedule CT chest for July 2013 Follow up after CT chest in July 2013

## 2011-07-03 NOTE — Assessment & Plan Note (Signed)
Stable on her current regimen of spiriva and combivent.   

## 2011-07-03 NOTE — Assessment & Plan Note (Signed)
She was noted to have pulmonary nodule on recent CT chest.  Reviewed several options on how to further evaluate.  Will repeat CT chest w/o contrast in 4 months, and then re-evaluate whether additional interventions are needed.

## 2011-07-03 NOTE — Assessment & Plan Note (Signed)
She is to continue supplemental oxygen at night. 

## 2011-07-03 NOTE — Progress Notes (Signed)
Chief Complaint  Patient presents with  . Follow-up    Pt states she had ct 06/22/11 and found a small nodule on right lung    History of Present Illness: [name] is a 78 y.o. female COPD, nocturnal hypoxia, and recurrent pneumonia.  She was treated for several episodes of pneumonia since Christmas.  She is feeling better, but still gets winded with exertion.  She is not having much cough, or wheeze.  She uses her oxygen at night and sometimes during the day.  She uses spiriva daily, but does not use her nebulizer much.  She was started on daliresp, but thought this made her feel worse.  As a result this was stopped.   Past Medical History  Diagnosis Date  . Cholelithiases   . CHF (congestive heart failure)   . Hypertension   . Hypothyroid   . CAD (coronary artery disease)   . Leg paresthesia   . Macular degeneration   . Aortic valve stenosis   . CVA (cerebral infarction)   . Arthritis   . Hyperlipidemia   . Dementia   . COPD (chronic obstructive pulmonary disease)   . Hypoxemia     Past Surgical History  Procedure Date  . Vesicovaginal fistula closure w/ tah   . Tonsillectomy   . Cataract extraction     Allergies  Allergen Reactions  . Minocycline Hcl   . Triple Antibiotic     Physical Exam:  Blood pressure 132/72, pulse 65, temperature 97.8 F (36.6 C), temperature source Oral, height 5' 4.5" (1.638 m), weight 170 lb 9.6 oz (77.384 kg), SpO2 95.00%. Body mass index is 28.83 kg/(m^2). Wt Readings from Last 2 Encounters:  07/03/11 170 lb 9.6 oz (77.384 kg)  03/29/11 174 lb (78.926 kg)    General - No distress HEENT - no sinus tenderness, clear nasal discharge, no oral exudate Cardiac - s1s2 regular with 2/6 SM Chest - prolonged exhalation, no wheeze/rales Abdomen - soft, nontender Extremities - no edema Skin - no rashes Neurologic - normal strength Psychiatric - normal mood, behavior  Ct Chest W Contrast  06/22/2011  *RADIOLOGY REPORT*  Clinical  Data:  Loss of weight  CT CHEST WITH CONTRAST  Technique:  Multidetector CT imaging of the chest was performed following the standard protocol during bolus administration of intravenous contrast.  Contrast: 75mL OMNIPAQUE IOHEXOL 300 MG/ML IJ SOLN  Comparison: None  Findings: No enlarged axillary or supraclavicular nodes.  There is no enlarged mediastinal or hilar adenopathy.  No pericardial or pleural effusions identified.  There is no enlarged mediastinal or hilar lymph nodes. Calcification of the RCA, LAD and left circumflex coronary arteries noted.  No pericardial or pleural effusions.  Moderate hiatal hernia noted.  Moderate emphysema.  Biapical scarring is present.  There is a subpleural bullae are within the right lower lobe which measures 3.6 cm, image 39.  There is a tiny nodule in the right lower lobe which measures 5.2 mm, image 39.  This is indeterminate.  There is a nodule within the right lower lobe which measures 1 cm, image number 33.  Also indeterminate with a small amount of adjacent scarring.  Review of the visualized osseous structures shows diffuse osteopenia and mild spondylosis.  Limited imaging through the upper abdomen is unremarkable.   IMPRESSION:  1.  Emphysema. 2.  Pulmonary nodule in the right lung measures 1 cm.  Advise further evaluation with PET CT. 3.  There is atherosclerosis of the thoracic aorta, the great vessels  of the mediastinum and the coronary arteries, including calcified atherosclerotic plaque in the RCA, LAD, and left circumflex coronary arteries.  Atherosclerosis, including three-vessel coronary artery disease. Please note that although the presence of coronary artery calcium documents the presence of coronary artery disease, the severity of this disease and any potential stenosis cannot be assessed on this non-gated CT examination.  Assessment for potential risk factor modification, dietary therapy or pharmacologic therapy may be warranted, if clinically indicated.     Original Report Authenticated By: Rosealee Albee, M.D.    Assessment/Plan:  Outpatient Encounter Prescriptions as of 07/03/2011  Medication Sig Dispense Refill  . acetaminophen (TYLENOL) 325 MG tablet 2 every am as needed      . amoxicillin (AMOXIL) 500 MG capsule 4 capsules prior to dental work       . atorvastatin (LIPITOR) 10 MG tablet Take 10 mg by mouth daily.        . cetirizine (ZYRTEC) 10 MG tablet Take 10 mg by mouth at bedtime.        . chlorhexidine (PERIDEX) 0.12 % solution Use as directed 15 mLs in the mouth or throat daily.        . Cholecalciferol (VITAMIN D) 2000 UNITS CAPS Take 1 capsule by mouth daily.        Marland Kitchen donepezil (ARICEPT) 10 MG tablet Take 10 mg by mouth at bedtime.       Marland Kitchen escitalopram (LEXAPRO) 20 MG tablet Take 20 mg by mouth daily.        Marland Kitchen estrogens, conjugated, (PREMARIN) 0.3 MG tablet Take 0.3 mg by mouth daily. Take daily for 21 days then do not take for 7 days.       Marland Kitchen gabapentin (NEURONTIN) 100 MG capsule 2 at bedtime       . hydrocortisone-pramoxine (ANALPRAM-HC) 2.5-1 % rectal cream Place 1 application rectally 3 (three) times daily as needed.        Marland Kitchen ipratropium-albuterol (DUONEB) 0.5-2.5 (3) MG/3ML SOLN Take 3 mLs by nebulization every 6 (six) hours as needed.        Marland Kitchen levothyroxine (SYNTHROID, LEVOTHROID) 88 MCG tablet Take 88 mcg by mouth daily.        Marland Kitchen lubiprostone (AMITIZA) 8 MCG capsule Take 8 mcg by mouth at bedtime.        . meloxicam (MOBIC) 7.5 MG tablet Take 7.5 mg by mouth daily.        . mirtazapine (REMERON) 15 MG tablet Take 15 mg by mouth at bedtime.        . Multiple Vitamins-Minerals (MACULAR VITAMIN BENEFIT) TABS Take 1 tablet by mouth daily.        . nisoldipine (SULAR) 8.5 MG 24 hr tablet Take 8.5 mg by mouth 2 (two) times daily.        Marland Kitchen olopatadine (PATANOL) 0.1 % ophthalmic solution Place 1 drop into both eyes 2 (two) times daily as needed.        Marland Kitchen omeprazole (PRILOSEC) 40 MG capsule Take 40 mg by mouth daily.        .  polyethylene glycol (MIRALAX / GLYCOLAX) packet Take 17 g by mouth daily.        Marland Kitchen tiotropium (SPIRIVA) 18 MCG inhalation capsule Place 18 mcg into inhaler and inhale daily.        Marland Kitchen warfarin (COUMADIN) 5 MG tablet Take 5 mg by mouth daily.          Ivery Nanney Pager:  [phone] 07/03/2011, 1:33 PM

## 2011-07-11 ENCOUNTER — Ambulatory Visit: Payer: Medicare Other | Admitting: Internal Medicine

## 2011-08-25 DIAGNOSIS — K261 Acute duodenal ulcer with perforation: Secondary | ICD-10-CM

## 2011-08-25 HISTORY — DX: Acute duodenal ulcer with perforation: K26.1

## 2011-09-15 ENCOUNTER — Inpatient Hospital Stay (HOSPITAL_COMMUNITY)
Admission: EM | Admit: 2011-09-15 | Discharge: 2011-09-21 | DRG: 392 | Disposition: A | Discharge status: Skilled Nursing Facility | Payer: Medicare Other | Attending: Family Medicine | Admitting: Family Medicine

## 2011-09-15 ENCOUNTER — Emergency Department (HOSPITAL_COMMUNITY): Payer: Medicare Other

## 2011-09-15 ENCOUNTER — Encounter (HOSPITAL_COMMUNITY): Payer: Self-pay | Admitting: Emergency Medicine

## 2011-09-15 DIAGNOSIS — R509 Fever, unspecified: Secondary | ICD-10-CM

## 2011-09-15 DIAGNOSIS — J9610 Chronic respiratory failure, unspecified whether with hypoxia or hypercapnia: Secondary | ICD-10-CM | POA: Diagnosis present

## 2011-09-15 DIAGNOSIS — K5792 Diverticulitis of intestine, part unspecified, without perforation or abscess without bleeding: Secondary | ICD-10-CM

## 2011-09-15 DIAGNOSIS — K5732 Diverticulitis of large intestine without perforation or abscess without bleeding: Principal | ICD-10-CM | POA: Diagnosis present

## 2011-09-15 DIAGNOSIS — Z9981 Dependence on supplemental oxygen: Secondary | ICD-10-CM

## 2011-09-15 DIAGNOSIS — J449 Chronic obstructive pulmonary disease, unspecified: Secondary | ICD-10-CM | POA: Diagnosis present

## 2011-09-15 DIAGNOSIS — N182 Chronic kidney disease, stage 2 (mild): Secondary | ICD-10-CM | POA: Diagnosis present

## 2011-09-15 DIAGNOSIS — Z7901 Long term (current) use of anticoagulants: Secondary | ICD-10-CM

## 2011-09-15 DIAGNOSIS — D631 Anemia in chronic kidney disease: Secondary | ICD-10-CM | POA: Diagnosis present

## 2011-09-15 DIAGNOSIS — J4489 Other specified chronic obstructive pulmonary disease: Secondary | ICD-10-CM | POA: Diagnosis present

## 2011-09-15 DIAGNOSIS — E871 Hypo-osmolality and hyponatremia: Secondary | ICD-10-CM | POA: Diagnosis present

## 2011-09-15 DIAGNOSIS — I129 Hypertensive chronic kidney disease with stage 1 through stage 4 chronic kidney disease, or unspecified chronic kidney disease: Secondary | ICD-10-CM | POA: Diagnosis present

## 2011-09-15 DIAGNOSIS — I6529 Occlusion and stenosis of unspecified carotid artery: Secondary | ICD-10-CM | POA: Diagnosis present

## 2011-09-15 DIAGNOSIS — E039 Hypothyroidism, unspecified: Secondary | ICD-10-CM | POA: Diagnosis present

## 2011-09-15 DIAGNOSIS — D72829 Elevated white blood cell count, unspecified: Secondary | ICD-10-CM | POA: Diagnosis present

## 2011-09-15 HISTORY — DX: Pneumonia, unspecified organism: J18.9

## 2011-09-15 HISTORY — DX: Occlusion and stenosis of unspecified carotid artery: I65.29

## 2011-09-15 HISTORY — DX: Disorder of kidney and ureter, unspecified: N28.9

## 2011-09-15 LAB — LACTIC ACID, PLASMA: Lactic Acid, Venous: 1.9 mmol/L (ref 0.5–2.2)

## 2011-09-15 LAB — COMPREHENSIVE METABOLIC PANEL
Albumin: 3.4 g/dL — ABNORMAL LOW (ref 3.5–5.2)
BUN: 12 mg/dL (ref 6–23)
CO2: 23 mEq/L (ref 19–32)
Chloride: 97 mEq/L (ref 96–112)
Creatinine, Ser: 0.91 mg/dL (ref 0.50–1.10)
GFR calc Af Amer: 62 mL/min — ABNORMAL LOW (ref >90)
GFR calc non Af Amer: 54 mL/min — ABNORMAL LOW (ref >90)
Total Bilirubin: 0.5 mg/dL (ref 0.3–1.2)

## 2011-09-15 LAB — CBC
HCT: 30.8 % — ABNORMAL LOW (ref 36.0–46.0)
MCV: 78.8 fL (ref 78.0–100.0)
RDW: 17.1 % — ABNORMAL HIGH (ref 11.5–15.5)
WBC: 16.4 10*3/uL — ABNORMAL HIGH (ref 4.0–10.5)

## 2011-09-15 LAB — URINALYSIS, ROUTINE W REFLEX MICROSCOPIC
Bilirubin Urine: NEGATIVE
Ketones, ur: NEGATIVE mg/dL
Nitrite: NEGATIVE
pH: 5 (ref 5.0–8.0)

## 2011-09-15 LAB — TROPONIN I: Troponin I: <0.3 ng/mL (ref <0.30)

## 2011-09-15 MED ORDER — ACETAMINOPHEN 325 MG PO TABS
ORAL_TABLET | ORAL | Status: AC
  Filled 2011-09-15: qty 2

## 2011-09-15 MED ORDER — ACETAMINOPHEN 325 MG PO TABS
650.0000 mg | ORAL_TABLET | Freq: Once | ORAL | Status: AC
  Administered 2011-09-15: 650 mg via ORAL

## 2011-09-15 NOTE — ED Notes (Signed)
Onset of shaking chills at 1500 this afternoon associated w/ bilateral lower abdominal tenderness and frequency. Denies dysuria, N/V/D

## 2011-09-15 NOTE — ED Provider Notes (Signed)
History     CSN: [id number]  Arrival date & time 09/15/11  7253   First MD Initiated Contact with Patient 09/15/11 1948      Chief Complaint  Patient presents with  . Chills     The history is provided by the patient.   Patient reports 2 days of suprapubic pain and urinary frequency. She denies nausea and vomiting. Denies diarrhea. She reports that she has lower back pain when she has a bowel movement. She reports she began having shaking chills today, though she has not taken her temperature. She denies greater than baseline shortness of breath or cough, denies chest pain.  She does report pain and tenderness in her lower abdomen.  She denies new rash.  She denies perineal pain.  Her symptoms are mild to moderate.  Nothing worsens or improves her symptoms   Past Medical History  Diagnosis Date  . Carotid artery occlusion   . Renal disorder   . Hypertension   . COPD (chronic obstructive pulmonary disease)   . Gout   . Hyperlipemia     Past Surgical History  Procedure Date  . Breast surgery     No family history on file.  History  Substance Use Topics  . Smoking status: Former Games developer  . Smokeless tobacco: Not on file  . Alcohol Use: No    OB History    Grav Para Term Preterm Abortions TAB SAB Ect Mult Living                  Review of Systems  All other systems reviewed and are negative.    Allergies  Cortisone; Minocin; Neosporin; Other; and Tetanus toxoids  Home Medications   Current Outpatient Rx  Name Route Sig Dispense Refill  . ACETAMINOPHEN 325 MG PO TABS Oral Take 650 mg by mouth See admin instructions. Take 2 tablets every morning scheduled. Take 2 tablets every 6 hours as needed for pain.    . ACYCLOVIR 5 % EX OINT Topical Apply 1 application topically as needed. For cold sores. Resident may self-administer.    . AMOXICILLIN 500 MG PO CAPS Oral Take 2,000 mg by mouth once. 1 hour prior to dental appointment as needed.    . ATORVASTATIN CALCIUM  10 MG PO TABS Oral Take 10 mg by mouth daily.    Marland Kitchen CETIRIZINE HCL 10 MG PO TABS Oral Take 10 mg by mouth at bedtime.    . CHLORHEXIDINE GLUCONATE 0.12 % MT SOLN Mouth/Throat Use as directed 15 mLs in the mouth or throat 2 (two) times daily. Swish by mouth for 30 seconds, then spit twice daily. May self-administer.    Marland Kitchen VITAMIN D 1000 UNITS PO TABS Oral Take 2,000 Units by mouth daily.    . DONEPEZIL HCL 10 MG PO TABS Oral Take 10 mg by mouth at bedtime.    Marland Kitchen ESCITALOPRAM OXALATE 20 MG PO TABS Oral Take 20 mg by mouth daily.    Marland Kitchen ESTROGENS CONJUGATED 0.3 MG PO TABS Oral Take 0.3 mg by mouth daily.     Marland Kitchen FOLIC ACID 1 MG PO TABS Oral Take 1 mg by mouth daily.    Marland Kitchen GABAPENTIN 100 MG PO CAPS Oral Take 200 mg by mouth at bedtime.    Marland Kitchen HYDROCODONE-ACETAMINOPHEN 5-325 MG PO TABS Oral Take 1 tablet by mouth every 6 (six) hours as needed. Pain.    Marland Kitchen HYDROCORTISONE ACE-PRAMOXINE 2.5-1 % RE CREA Rectal Place 1 application rectally 2 (two) times daily as  needed. Inflammation. May self-administer.    . IPRATROPIUM-ALBUTEROL 0.5-2.5 (3) MG/3ML IN SOLN Nebulization Take 3 mLs by nebulization every 6 (six) hours as needed. Wheezing and shortness of breath.    Marland Kitchen LEVOTHYROXINE SODIUM 88 MCG PO TABS Oral Take 88 mcg by mouth daily. May keep at bedside. (Place with bedtime meds for resident to self-administer early morning.)    . LUBIPROSTONE 8 MCG PO CAPS Oral Take 8 mcg by mouth at bedtime.    . MELOXICAM 7.5 MG PO TABS Oral Take 7.5 mg by mouth daily.    Marland Kitchen MIRTAZAPINE 15 MG PO TABS Oral Take 15 mg by mouth at bedtime.    Marland Kitchen EYE VITAMINS PO TABS Oral Take 1 tablet by mouth 2 (two) times daily.    Marland Kitchen NISOLDIPINE ER 17 MG PO TB24 Oral Take 17 mg by mouth daily.    . OLOPATADINE HCL 0.1 % OP SOLN Both Eyes Place into both eyes 2 (two) times daily as needed. Allergy irritation. May self-administer.    Marland Kitchen OMEPRAZOLE 40 MG PO CPDR Oral Take 40 mg by mouth daily.    Marland Kitchen POLYETHYL GLYCOL-PROPYL GLYCOL 0.4-0.3 % OP SOLN  Ophthalmic Apply 1 drop to eye as needed. Dryness itching.    Marland Kitchen POLYETHYLENE GLYCOL 3350 PO PACK Oral Take 17 g by mouth daily.    Marland Kitchen TIOTROPIUM BROMIDE MONOHYDRATE 18 MCG IN CAPS Inhalation Place 18 mcg into inhaler and inhale daily. May self administer.    . WARFARIN SODIUM 5 MG PO TABS Oral Take 5 mg by mouth daily.      BP 136/53  Pulse 87  Temp 100.4 F (38 C) (Oral)  Resp 24  SpO2 97%  Physical Exam  Nursing note and vitals reviewed. Constitutional: She is oriented to person, place, and time. She appears well-developed and well-nourished. No distress.  HENT:  Head: Normocephalic and atraumatic.  Eyes: EOM are normal.  Neck: Normal range of motion. Neck supple.  Cardiovascular: Normal rate and regular rhythm.   Murmur heard.  Systolic murmur is present with a grade of 3/6  Pulmonary/Chest: Effort normal. No respiratory distress.       Expiratory wheezes throughout  Abdominal: Soft. Normal appearance. She exhibits no distension. There is tenderness in the suprapubic area.  Musculoskeletal: Normal range of motion.  Neurological: She is alert and oriented to person, place, and time.  Skin: Skin is warm and dry.  Psychiatric: She has a normal mood and affect. Judgment normal.    ED Course  Procedures (including critical care time)  Labs Reviewed  URINALYSIS, ROUTINE W REFLEX MICROSCOPIC - Abnormal; Notable for the following:    Hgb urine dipstick SMALL (*)     Leukocytes, UA SMALL (*)     All other components within normal limits  CBC - Abnormal; Notable for the following:    WBC 16.4 (*)     Hemoglobin 9.5 (*)     HCT 30.8 (*)     MCH 24.3 (*)     RDW 17.1 (*)     All other components within normal limits  COMPREHENSIVE METABOLIC PANEL - Abnormal; Notable for the following:    Sodium 132 (*)     Glucose, Bld 123 (*)     Albumin 3.4 (*)     GFR calc non Af Amer 54 (*)     GFR calc Af Amer 62 (*)     All other components within normal limits  URINE MICROSCOPIC-ADD  ON  LACTIC ACID, PLASMA  TROPONIN I  CULTURE, BLOOD (ROUTINE X 2)  CULTURE, BLOOD (ROUTINE X 2)  URINE CULTURE  PROTIME-INR   Dg Chest 2 View  09/15/2011  *RADIOLOGY REPORT*  Clinical Data: Fever, cough and chills.  CHEST - 2 VIEW  Comparison: None.  Findings: The lungs are well-aerated.  Minimal bibasilar opacities likely reflect atelectasis.  Chronic peribronchial thickening is noted.  There is no evidence of focal opacification, pleural effusion or pneumothorax.  The heart is borderline normal in size; the mediastinal contour is within normal limits.  No acute osseous abnormalities are seen.  A moderate hiatal hernia is seen.  IMPRESSION:  1.  Minimal bibasilar opacities likely reflect atelectasis; lungs otherwise clear. 2.  Moderate hiatal hernia noted.  Original Report Authenticated By: Tonia Ghent, M.D.   Ct Abdomen Pelvis W Contrast  09/16/2011  *RADIOLOGY REPORT*  Clinical Data: Fever, chills.  CT ABDOMEN AND PELVIS WITH CONTRAST  Technique:  Multidetector CT imaging of the abdomen and pelvis was performed following the standard protocol during bolus administration of intravenous contrast.  Contrast: OMNIPAQUE IOHEXOL 300 MG/ML  SOLN  Comparison: None.  Findings: Moderate sized hiatal hernia.  Linear scarring in the lung bases.  No effusions.  Bullous change in the right lung base.  Slightly nodular contours of the liver surface could reflect mild changes of cirrhosis.  Recommend clinical correlation.  No focal abnormality.  Spleen, pancreas, adrenals and kidneys are unremarkable except for small cysts in the kidneys bilaterally. Appendix is visualized and is normal.  Small bowel is decompressed.  Inflammatory change around the sigmoid colon, likely sigmoid diverticulitis.  There is extraluminal gas compatible with contained perforation.  No well-defined abscess currently.  Aorta and iliac vessels are heavily calcified, non-aneurysmal.  Degenerative changes throughout the lumbar spine.   Grade 1 anterolisthesis of L4 on L5 related to facet disease.  No acute bony abnormality.  IMPRESSION: Stranding around the sigmoid colon compatible with acute diverticulitis.  There is extraluminal gas adjacent to the sigmoid colon compatible with contained perforation.  Normal appendix.  Fine nodular contours to the liver suggesting the possibility of mild cirrhosis.  Recommend clinical correlation.  Moderate sized hiatal hernia.  Original Report Authenticated By: Cyndie Chime, M.D.   I personally reviewed the imaging tests through PACS system  I reviewed available ER/hospitalization records thought the EMR    1. Diverticulitis   2. Fever       MDM  The patient has evidence of diverticulitis with microperforation.  IV Cipro and Flagyl this time.  This is the cause of her lower abdominal pain and fever 102.8.  The patient be admitted to the hospital.        Lyanne Co, MD 09/16/11 [phone]

## 2011-09-15 NOTE — ED Notes (Signed)
Patient transported to X-ray on stretcher with x-ray tech. 

## 2011-09-15 NOTE — ED Notes (Signed)
Bed:WA13<BR> Expected date:<BR> Expected time:<BR> Means of arrival:<BR> Comments:<BR>

## 2011-09-15 NOTE — ED Notes (Signed)
Bedside report received from previous RN. Pt is A&O x4 but slightly agitated r/t having to wait for treatment. Support given.

## 2011-09-16 ENCOUNTER — Encounter (HOSPITAL_COMMUNITY): Payer: Self-pay | Admitting: *Deleted

## 2011-09-16 DIAGNOSIS — K5792 Diverticulitis of intestine, part unspecified, without perforation or abscess without bleeding: Secondary | ICD-10-CM | POA: Diagnosis present

## 2011-09-16 DIAGNOSIS — K5732 Diverticulitis of large intestine without perforation or abscess without bleeding: Secondary | ICD-10-CM

## 2011-09-16 DIAGNOSIS — Z7901 Long term (current) use of anticoagulants: Secondary | ICD-10-CM

## 2011-09-16 DIAGNOSIS — I10 Essential (primary) hypertension: Secondary | ICD-10-CM

## 2011-09-16 DIAGNOSIS — E782 Mixed hyperlipidemia: Secondary | ICD-10-CM

## 2011-09-16 LAB — DIFFERENTIAL
Basophils Relative: 0 % (ref 0–1)
Eosinophils Absolute: 0 10*3/uL (ref 0.0–0.7)
Monocytes Absolute: 1.3 10*3/uL — ABNORMAL HIGH (ref 0.1–1.0)
Monocytes Relative: 8 % (ref 3–12)

## 2011-09-16 LAB — CBC
MCH: 24.4 pg — ABNORMAL LOW (ref 26.0–34.0)
MCHC: 31.1 g/dL (ref 30.0–36.0)
Platelets: 201 10*3/uL (ref 150–400)

## 2011-09-16 LAB — URINE CULTURE

## 2011-09-16 LAB — COMPREHENSIVE METABOLIC PANEL
ALT: 14 U/L (ref 0–35)
AST: 27 U/L (ref 0–37)
CO2: 24 mEq/L (ref 19–32)
Calcium: 8.5 mg/dL (ref 8.4–10.5)
Chloride: 102 mEq/L (ref 96–112)
Creatinine, Ser: 0.93 mg/dL (ref 0.50–1.10)
GFR calc Af Amer: 61 mL/min — ABNORMAL LOW (ref >90)
GFR calc non Af Amer: 53 mL/min — ABNORMAL LOW (ref >90)
Glucose, Bld: 98 mg/dL (ref 70–99)
Total Bilirubin: 0.6 mg/dL (ref 0.3–1.2)

## 2011-09-16 LAB — PHOSPHORUS: Phosphorus: 2.4 mg/dL (ref 2.3–4.6)

## 2011-09-16 LAB — PROTIME-INR
INR: 2.27 — ABNORMAL HIGH (ref 0.00–1.49)
Prothrombin Time: 25.4 seconds — ABNORMAL HIGH (ref 11.6–15.2)

## 2011-09-16 LAB — GLUCOSE, CAPILLARY: Glucose-Capillary: 93 mg/dL (ref 70–99)

## 2011-09-16 LAB — FERRITIN: Ferritin: 23 ng/mL (ref 10–291)

## 2011-09-16 LAB — IRON AND TIBC: Saturation Ratios: 6 % — ABNORMAL LOW (ref 20–55)

## 2011-09-16 MED ORDER — METRONIDAZOLE IN NACL 5-0.79 MG/ML-% IV SOLN
500.0000 mg | Freq: Once | INTRAVENOUS | Status: AC
  Administered 2011-09-16: 500 mg via INTRAVENOUS
  Filled 2011-09-16: qty 100

## 2011-09-16 MED ORDER — METRONIDAZOLE IN NACL 5-0.79 MG/ML-% IV SOLN
500.0000 mg | Freq: Three times a day (TID) | INTRAVENOUS | Status: DC
  Administered 2011-09-16 – 2011-09-20 (×13): 500 mg via INTRAVENOUS
  Filled 2011-09-16 (×13): qty 100

## 2011-09-16 MED ORDER — POLYETHYLENE GLYCOL 3350 17 G PO PACK
17.0000 g | PACK | Freq: Every day | ORAL | Status: DC
  Filled 2011-09-16 (×2): qty 1

## 2011-09-16 MED ORDER — LORATADINE 10 MG PO TABS
10.0000 mg | ORAL_TABLET | Freq: Every day | ORAL | Status: DC
  Administered 2011-09-16: 10 mg via ORAL
  Filled 2011-09-16: qty 1

## 2011-09-16 MED ORDER — LUBIPROSTONE 8 MCG PO CAPS
8.0000 ug | ORAL_CAPSULE | Freq: Every day | ORAL | Status: DC
  Filled 2011-09-16: qty 1

## 2011-09-16 MED ORDER — GABAPENTIN 100 MG PO CAPS
200.0000 mg | ORAL_CAPSULE | Freq: Every day | ORAL | Status: DC
  Filled 2011-09-16: qty 2

## 2011-09-16 MED ORDER — OLOPATADINE HCL 0.1 % OP SOLN
1.0000 [drp] | Freq: Two times a day (BID) | OPHTHALMIC | Status: DC
  Administered 2011-09-16 – 2011-09-21 (×7): 1 [drp] via OPHTHALMIC
  Filled 2011-09-16 (×2): qty 5

## 2011-09-16 MED ORDER — LEVOTHYROXINE SODIUM 100 MCG IV SOLR
44.0000 ug | Freq: Every day | INTRAVENOUS | Status: DC
  Administered 2011-09-17 – 2011-09-20 (×4): 44 ug via INTRAVENOUS
  Filled 2011-09-16 (×4): qty 2.2

## 2011-09-16 MED ORDER — ATORVASTATIN CALCIUM 10 MG PO TABS
10.0000 mg | ORAL_TABLET | Freq: Every day | ORAL | Status: DC
  Administered 2011-09-16: 10 mg via ORAL
  Filled 2011-09-16: qty 1

## 2011-09-16 MED ORDER — ALBUTEROL SULFATE (5 MG/ML) 0.5% IN NEBU
2.5000 mg | INHALATION_SOLUTION | RESPIRATORY_TRACT | Status: DC | PRN
  Administered 2011-09-20: 2.5 mg via RESPIRATORY_TRACT
  Filled 2011-09-16: qty 0.5

## 2011-09-16 MED ORDER — VITAMIN D3 25 MCG (1000 UNIT) PO TABS
2000.0000 [IU] | ORAL_TABLET | Freq: Every day | ORAL | Status: DC
  Administered 2011-09-16 – 2011-09-21 (×6): 2000 [IU] via ORAL
  Filled 2011-09-16 (×6): qty 2

## 2011-09-16 MED ORDER — MORPHINE SULFATE 2 MG/ML IJ SOLN: 1.0000 mg | INTRAMUSCULAR | Status: DC | PRN

## 2011-09-16 MED ORDER — DONEPEZIL HCL 10 MG PO TABS
10.0000 mg | ORAL_TABLET | Freq: Every day | ORAL | Status: DC
  Filled 2011-09-16: qty 1

## 2011-09-16 MED ORDER — NISOLDIPINE ER 17 MG PO TB24
17.0000 mg | ORAL_TABLET | Freq: Every day | ORAL | Status: DC
  Administered 2011-09-16 – 2011-09-21 (×6): 17 mg via ORAL
  Filled 2011-09-16 (×6): qty 1

## 2011-09-16 MED ORDER — PANTOPRAZOLE SODIUM 40 MG PO TBEC
40.0000 mg | DELAYED_RELEASE_TABLET | Freq: Every day | ORAL | Status: DC
  Administered 2011-09-16: 40 mg via ORAL
  Filled 2011-09-16: qty 1

## 2011-09-16 MED ORDER — SODIUM CHLORIDE 0.9 % IV SOLN: INTRAVENOUS | Status: DC

## 2011-09-16 MED ORDER — PROSIGHT PO TABS
1.0000 | ORAL_TABLET | Freq: Two times a day (BID) | ORAL | Status: DC
  Administered 2011-09-16: 1 via ORAL
  Filled 2011-09-16 (×2): qty 1

## 2011-09-16 MED ORDER — ACYCLOVIR 5 % EX OINT
1.0000 "application " | TOPICAL_OINTMENT | CUTANEOUS | Status: DC | PRN
  Filled 2011-09-16: qty 30

## 2011-09-16 MED ORDER — MIRTAZAPINE 15 MG PO TABS
15.0000 mg | ORAL_TABLET | Freq: Every day | ORAL | Status: DC
  Administered 2011-09-16 – 2011-09-20 (×5): 15 mg via ORAL
  Filled 2011-09-16 (×6): qty 1

## 2011-09-16 MED ORDER — TIOTROPIUM BROMIDE MONOHYDRATE 18 MCG IN CAPS
18.0000 ug | ORAL_CAPSULE | Freq: Every day | RESPIRATORY_TRACT | Status: DC
  Administered 2011-09-17 – 2011-09-21 (×3): 18 ug via RESPIRATORY_TRACT
  Filled 2011-09-16 (×3): qty 5

## 2011-09-16 MED ORDER — IOHEXOL 300 MG/ML  SOLN
100.0000 mL | Freq: Once | INTRAMUSCULAR | Status: AC | PRN
  Administered 2011-09-16: 100 mL via INTRAVENOUS

## 2011-09-16 MED ORDER — LEVOTHYROXINE SODIUM 88 MCG PO TABS
88.0000 ug | ORAL_TABLET | Freq: Every day | ORAL | Status: DC
  Administered 2011-09-16: 88 ug via ORAL
  Filled 2011-09-16 (×2): qty 1

## 2011-09-16 MED ORDER — WARFARIN - PHYSICIAN DOSING INPATIENT: Freq: Every day | Status: DC

## 2011-09-16 MED ORDER — SODIUM CHLORIDE 0.9 % IV SOLN
INTRAVENOUS | Status: DC
  Administered 2011-09-16 – 2011-09-20 (×7): via INTRAVENOUS

## 2011-09-16 MED ORDER — ONDANSETRON HCL 4 MG/2ML IJ SOLN: 4.0000 mg | Freq: Three times a day (TID) | INTRAMUSCULAR | Status: AC | PRN

## 2011-09-16 MED ORDER — CIPROFLOXACIN HCL 500 MG PO TABS
500.0000 mg | ORAL_TABLET | Freq: Once | ORAL | Status: AC
  Administered 2011-09-16: 500 mg via ORAL
  Filled 2011-09-16: qty 1

## 2011-09-16 MED ORDER — POLYETHYL GLYCOL-PROPYL GLYCOL 0.4-0.3 % OP SOLN: 1.0000 [drp] | OPHTHALMIC | Status: DC | PRN

## 2011-09-16 MED ORDER — CIPROFLOXACIN IN D5W 400 MG/200ML IV SOLN
400.0000 mg | Freq: Two times a day (BID) | INTRAVENOUS | Status: DC
  Administered 2011-09-16 – 2011-09-20 (×8): 400 mg via INTRAVENOUS
  Filled 2011-09-16 (×9): qty 200

## 2011-09-16 MED ORDER — SODIUM CHLORIDE 0.9 % IV SOLN: 250.0000 mL | INTRAVENOUS | Status: DC | PRN

## 2011-09-16 MED ORDER — POLYVINYL ALCOHOL 1.4 % OP SOLN
1.0000 [drp] | OPHTHALMIC | Status: DC | PRN
  Filled 2011-09-16: qty 15

## 2011-09-16 MED ORDER — WARFARIN SODIUM 5 MG PO TABS
5.0000 mg | ORAL_TABLET | Freq: Every day | ORAL | Status: DC
  Filled 2011-09-16: qty 1

## 2011-09-16 MED ORDER — MELOXICAM 7.5 MG PO TABS
7.5000 mg | ORAL_TABLET | Freq: Every day | ORAL | Status: DC
  Administered 2011-09-16: 7.5 mg via ORAL
  Filled 2011-09-16: qty 1

## 2011-09-16 MED ORDER — IOHEXOL 300 MG/ML  SOLN: 100.0000 mL | Freq: Once | INTRAMUSCULAR | Status: AC | PRN

## 2011-09-16 MED ORDER — FOLIC ACID 1 MG PO TABS
1.0000 mg | ORAL_TABLET | Freq: Every day | ORAL | Status: DC
  Administered 2011-09-16: 1 mg via ORAL
  Filled 2011-09-16: qty 1

## 2011-09-16 MED ORDER — ESCITALOPRAM OXALATE 20 MG PO TABS
20.0000 mg | ORAL_TABLET | Freq: Every day | ORAL | Status: DC
  Administered 2011-09-16: 20 mg via ORAL
  Filled 2011-09-16: qty 1

## 2011-09-16 MED ORDER — SODIUM CHLORIDE 0.9 % IJ SOLN: 3.0000 mL | INTRAMUSCULAR | Status: DC | PRN

## 2011-09-16 MED ORDER — PANTOPRAZOLE SODIUM 40 MG IV SOLR
40.0000 mg | INTRAVENOUS | Status: DC
  Administered 2011-09-16 – 2011-09-19 (×4): 40 mg via INTRAVENOUS
  Filled 2011-09-16 (×5): qty 40

## 2011-09-16 MED ORDER — HYDROCORTISONE ACE-PRAMOXINE 2.5-1 % RE CREA
1.0000 "application " | TOPICAL_CREAM | Freq: Two times a day (BID) | RECTAL | Status: DC | PRN
  Filled 2011-09-16: qty 30

## 2011-09-16 MED ORDER — HYDROCODONE-ACETAMINOPHEN 5-325 MG PO TABS: 1.0000 | ORAL_TABLET | Freq: Four times a day (QID) | ORAL | Status: DC | PRN

## 2011-09-16 MED ORDER — SODIUM CHLORIDE 0.9 % IJ SOLN
3.0000 mL | Freq: Two times a day (BID) | INTRAMUSCULAR | Status: DC
  Administered 2011-09-16: 3 mL via INTRAVENOUS

## 2011-09-16 NOTE — ED Notes (Signed)
Called report to 5W floor RN.  

## 2011-09-16 NOTE — ED Notes (Signed)
Report called to 5W floor RN

## 2011-09-16 NOTE — Consult Note (Addendum)
Re:   [name] DOB:   January 05, 1920 MRN:   [id number]  ASSESSMENT AND PLAN: 1.  Diverticulitis, sigmoid colon with contained perforation  Hopefully this will respond to medical management.  Agree with antibiotics, NPO (though she had a regular tray in the room, I think that has been corrected), and IVF.  The patient is fairly adamant that she does not want surgery.  I did not push anything, because I am hoping that this will get better.  2.  UTI - urine culture pending  3. Chronic renal disease   Thought Creat - 0.93 - 09/16/2011  4.  Anemia  Hgb - 8.8 - 09/16/2011  5.  Anticoagulated  INR - 2.27 - 09/16/2011  6.  Changes in liver (on CT scan) worrisome for early cirrhosis. 7.  Moderate HH on CT scan.    Chief Complaint  Patient presents with  . Chills   REFERRING PHYSICIAN:  Dr. Francisco Capuchin  HISTORY OF PRESENT ILLNESS: [name] is a 78 y.o. (DOB: Jun 25, 1919)  white female whose primary care physician is Dr. Gordy Levan and Claudette, his PA,  and was admitted for diverticulitis.   She developed abdominal pain on Sat, 6/22, and had the "shakes".  She was more worried about the shakes and came to the [facility].  She has no prior history of diverticular disease.  She has no history of stomach disease.  No history of liver disease.  No history of gall bladder disease.  No history of pancreas disease.  No history of colon disease.  She has had a hysterectomy for benign disease.  WBC - 15,700 - 09/16/2011 CT scan - Stranding around the sigmoid colon compatible with acute diverticulitis. There is extraluminal gas adjacent to the sigmoid colon compatible with contained perforation. - Dr. Vic Ripper    Past Medical History  Diagnosis Date  . Carotid artery occlusion   . Renal disorder   . Hypertension   . COPD (chronic obstructive pulmonary disease)   . Gout   . Hyperlipemia   . Shortness of breath   . Pneumonia       Past Surgical History  Procedure Date  . Breast surgery   .  Abdominal hysterectomy       Current Facility-Administered Medications  Medication Dose Route Frequency Provider Last Rate Last Dose  . 0.9 %  sodium chloride infusion   Intravenous Continuous Alison Murray, MD 75 mL/hr at 09/16/11 1005    . acetaminophen (TYLENOL) tablet 650 mg  650 mg Oral Once Lyanne Co, MD   650 mg at 09/15/11 2115  . acyclovir ointment (ZOVIRAX) 5 % 1 application  1 application Topical PRN Alison Murray, MD      . albuterol (PROVENTIL) (5 MG/ML) 0.5% nebulizer solution 2.5 mg  2.5 mg Nebulization Q2H PRN Alison Murray, MD      . atorvastatin (LIPITOR) tablet 10 mg  10 mg Oral Daily Alison Murray, MD   10 mg at 09/16/11 1009  . cholecalciferol (VITAMIN D) tablet 2,000 Units  2,000 Units Oral Daily Alison Murray, MD   2,000 Units at 09/16/11 1012  . ciprofloxacin (CIPRO) IVPB 400 mg  400 mg Intravenous Q12H Alison Murray, MD      . ciprofloxacin (CIPRO) tablet 500 mg  500 mg Oral Once Lyanne Co, MD   500 mg at 09/16/11 0058  . donepezil (ARICEPT) tablet 10 mg  10 mg Oral QHS Alison Murray, MD      .  escitalopram (LEXAPRO) tablet 20 mg  20 mg Oral Daily Alison Murray, MD   20 mg at 09/16/11 1012  . folic acid (FOLVITE) tablet 1 mg  1 mg Oral Daily Alison Murray, MD   1 mg at 09/16/11 1015  . gabapentin (NEURONTIN) capsule 200 mg  200 mg Oral QHS Alison Murray, MD      . HYDROcodone-acetaminophen [facility]) 5-325 MG per tablet 1 tablet  1 tablet Oral Q6H PRN Alison Murray, MD      . hydrocortisone-pramoxine [facility]) 2.5-1 % rectal cream 1 application  1 application Rectal BID PRN Alison Murray, MD      . iohexol (OMNIPAQUE) 300 MG/ML solution 100 mL  100 mL Intravenous Once PRN Medication Radiologist, MD      . iohexol (OMNIPAQUE) 300 MG/ML solution 100 mL  100 mL Intravenous Once PRN Medication Radiologist, MD   100 mL at 09/16/11 0017  . levothyroxine (SYNTHROID, LEVOTHROID) tablet 88 mcg  88 mcg Oral QAC breakfast Alison Murray, MD   88 mcg at 09/16/11 [phone]  .  loratadine (CLARITIN) tablet 10 mg  10 mg Oral Daily Alison Murray, MD   10 mg at 09/16/11 1009  . lubiprostone (AMITIZA) capsule 8 mcg  8 mcg Oral QHS Alison Murray, MD      . meloxicam [facility]) tablet 7.5 mg  7.5 mg Oral Daily Alison Murray, MD   7.5 mg at 09/16/11 1015  . metroNIDAZOLE (FLAGYL) IVPB 500 mg  500 mg Intravenous Once Lyanne Co, MD   500 mg at 09/16/11 0058  . metroNIDAZOLE (FLAGYL) IVPB 500 mg  500 mg Intravenous Q8H Alison Murray, MD   500 mg at 09/16/11 1006  . mirtazapine (REMERON) tablet 15 mg  15 mg Oral QHS Alison Murray, MD      . morphine 2 MG/ML injection 1 mg  1 mg Intravenous Q4H PRN Alison Murray, MD      . multivitamin (PROSIGHT) tablet 1 tablet  1 tablet Oral BID Alison Murray, MD   1 tablet at 09/16/11 1015  . nisoldipine (SULAR) 24 hr tablet 17 mg  17 mg Oral Daily Alison Murray, MD   17 mg at 09/16/11 1011  . olopatadine (PATANOL) 0.1 % ophthalmic solution 1 drop  1 drop Both Eyes BID Alison Murray, MD      . ondansetron [facility]) injection 4 mg  4 mg Intravenous Q8H PRN Lyanne Co, MD      . pantoprazole (PROTONIX) EC tablet 40 mg  40 mg Oral Q1200 Alison Murray, MD   40 mg at 09/16/11 1133  . polyethylene glycol (MIRALAX / GLYCOLAX) packet 17 g  17 g Oral Daily Alison Murray, MD      . polyvinyl alcohol (LIQUIFILM TEARS) 1.4 % ophthalmic solution 1 drop  1 drop Both Eyes PRN Lyanne Co, MD      . tiotropium [facility]) inhalation capsule 18 mcg  18 mcg Inhalation Daily Alison Murray, MD      . warfarin (COUMADIN) tablet 5 mg  5 mg Oral q1800 Alison Murray, MD      . Warfarin - Physician Dosing Inpatient   Does not apply [id number] Alison Murray, MD      . DISCONTD: 0.9 %  sodium chloride infusion   Intravenous STAT Lyanne Co, MD      . DISCONTD: 0.9 %  sodium chloride infusion  250  mL Intravenous PRN Alison Murray, MD      . DISCONTD: Polyethyl Glycol-Propyl Glycol 0.4-0.3 % SOLN 1 drop  1 drop Ophthalmic PRN Alison Murray, MD      . DISCONTD: sodium  chloride 0.9 % injection 3 mL  3 mL Intravenous PRN Alison Murray, MD      . DISCONTD: sodium chloride 0.9 % injection 3 mL  3 mL Intravenous Q12H Alison Murray, MD   3 mL at 09/16/11 0205      Allergies  Allergen Reactions  . Cortisone Other (See Comments)    Unknown   . Minocin (Minocycline Hcl) Other (See Comments)    Unknown  . Neosporin (Neomycin-Bacitracin Zn-Polymyx) Other (See Comments)    Unknown  . Other Other (See Comments)    Steroidal Neuromuscular Blockers  . Tetanus Toxoids Other (See Comments)    Unknown    REVIEW OF SYSTEMS: Skin:  No history of rash.  No history of abnormal moles. Infection:  No history of hepatitis or HIV.  No history of MRSA. Neurologic:  History of TIA's.  Has blocked carotid artery.  This is the reason she is on Coumadin.  Seeing Dr. Sandria Manly (who just retired). Cardiac:  She did see Dr. Aleen Campi, but can not remember who she is seeing now. Pulmonary:  Quit smoking 15 years ago.  Had COPD.  Sees Dr. Craige Cotta.  She also has "spots" on her lungs that are being followed.  Endocrine:  No diabetes. No thyroid disease. Gastrointestinal:  See HPI. Urologic:  No history of kidney stones.  No history of bladder infections. Musculoskeletal:  No history of joint or back disease. Hematologic:  On coumadin. Psycho-social:  The patient is oriented.   The patient has no obvious psychologic or social impairment to understanding our conversation and plan.  Pleasant older lady who is pretty sure she does not want surgery for any reason.  SOCIAL and FAMILY HISTORY: Lives in [place]. Married 3 times - all husband dead. Two children - 65 and 53, but do not live here.  PHYSICAL EXAM: BP 140/65  Pulse 83  Temp 98.1 F (36.7 C) (Oral)  Resp 22  Ht 5\' 4"  (1.626 m)  Wt 165 lb (74.844 kg)  BMI 28.32 kg/m2  SpO2 95%  General: Older thin WF who is alert.Marland Kitchen  HEENT: Normal. Pupils equal. Good dentition. Neck: Supple. No mass.  No thyroid mass. Lymph Nodes:  No  supraclavicular or cervical nodes. Lungs: Clear to auscultation and symmetric breath sounds. Heart:  RRR. No murmur or rub.  Abdomen: Soft. No mass.  No hernia. Normal bowel sounds.  Mild lower abdominal tenderness. Rectal: Not done. Extremities:  Good strength and ROM  in upper and lower extremities. Neurologic:  Grossly intact to motor and sensory function. Psychiatric: Has normal mood and affect. Behavior is normal.   DATA REVIEWED: History and labs on chart.  Ovidio Kin, MD,  [facility] [address]  [address]   [place], [place]    [address] Phone:  [phone] FAX:  [phone]

## 2011-09-16 NOTE — Progress Notes (Signed)
Notified Lenny Pastel, NP of patients arrival to unit per MD order.  Admission orders clarified and DNR ordered per patients request and out of facility DNR.  Patient verbalized wish to be a DNR.  Patient a/ox3.  Plan of care discussed with patient and patient verbalized plan of care. Call light within reach.

## 2011-09-16 NOTE — H&P (Signed)
[facility] History and Physical  [name] VHQ:[id number] DOB: February 18, 1920 DOA: 09/15/2011  Referring physician: ED doctor PCP: No primary provider on file.   Chief Complaint: abdominal pain  HPI:  Pt is 78 yo female who presents to [facility] with main concern of progressively worsening generalized weakness associated with 2 day history of subjective fevers and chills, suprapubic area pain, cramp-like pain, intermittent in nature, 7/10 in severity when present, non radiating, with no specific aggravating or alleviating factors.Also associated with urinary frequency and urgency. Pt denies similar episodes in the past. Pt denies shortness of breath, chest pain, no other systemic concerns.   Review of Systems:   Constitutional: Positive for fever, chills and malaise/fatigue. Negative for diaphoresis.  HENT: Negative for hearing loss, ear pain, nosebleeds, congestion, sore throat, neck pain, tinnitus and ear discharge.   Eyes: Negative for blurred vision, double vision, photophobia, pain, discharge and redness.  Respiratory: Negative for cough, hemoptysis, sputum production, shortness of breath, wheezing and stridor.   Cardiovascular: Negative for chest pain, palpitations, orthopnea, claudication and leg swelling.  Gastrointestinal: Negative for nausea, vomiting. Negative for heartburn, constipation, blood in stool and melena.  Genitourinary: Negative for dysuria, hematuria and flank pain.  Musculoskeletal: Negative for myalgias, back pain, joint pain and falls.  Skin: Negative for itching and rash.  Neurological: Negative for dizziness. Negative for tingling, tremors, sensory change, speech change, focal weakness, loss of consciousness and headaches.  Endo/Heme/Allergies: Negative for environmental allergies and polydipsia. Does not bruise/bleed easily.  Psychiatric/Behavioral: Negative for suicidal ideas. The patient is not nervous/anxious.      Past Medical History    Diagnosis Date  . Carotid artery occlusion   . Renal disorder   . Hypertension   . COPD (chronic obstructive pulmonary disease)   . Gout   . Hyperlipemia    Past Surgical History  Procedure Date  . Breast surgery    Social History:  reports that she has quit smoking. She does not have any smokeless tobacco history on file. She reports that she does not drink alcohol. Her drug history not on file.  Allergies  Allergen Reactions  . Cortisone Other (See Comments)    Unknown   . Minocin (Minocycline Hcl) Other (See Comments)    Unknown  . Neosporin (Neomycin-Bacitracin Zn-Polymyx) Other (See Comments)    Unknown  . Other Other (See Comments)    Steroidal Neuromuscular Blockers  . Tetanus Toxoids Other (See Comments)    Unknown    No family history on file.  Prior to Admission medications   Medication Sig Start Date End Date Taking? Authorizing Provider  acetaminophen (MAPAP) 325 MG tablet Take 650 mg by mouth See admin instructions. Take 2 tablets every morning scheduled. Take 2 tablets every 6 hours as needed for pain.   Yes Historical Provider, MD  acyclovir ointment (ZOVIRAX) 5 % Apply 1 application topically as needed. For cold sores. Resident may self-administer.   Yes Historical Provider, MD  amoxicillin (AMOXIL) 500 MG capsule Take 2,000 mg by mouth once. 1 hour prior to dental appointment as needed.   Yes Historical Provider, MD  atorvastatin (LIPITOR) 10 MG tablet Take 10 mg by mouth daily.   Yes Historical Provider, MD  cetirizine (ZYRTEC) 10 MG tablet Take 10 mg by mouth at bedtime.   Yes Historical Provider, MD  chlorhexidine (PERIDEX) 0.12 % solution Use as directed 15 mLs in the mouth or throat 2 (two) times daily. Swish by mouth for 30 seconds, then spit twice  daily. May self-administer.   Yes Historical Provider, MD  cholecalciferol (VITAMIN D) 1000 UNITS tablet Take 2,000 Units by mouth daily.   Yes Historical Provider, MD  donepezil (ARICEPT) 10 MG tablet Take  10 mg by mouth at bedtime.   Yes Historical Provider, MD  escitalopram (LEXAPRO) 20 MG tablet Take 20 mg by mouth daily.   Yes Historical Provider, MD  estrogens, conjugated, (PREMARIN) 0.3 MG tablet Take 0.3 mg by mouth daily.    Yes Historical Provider, MD  folic acid (FOLVITE) 1 MG tablet Take 1 mg by mouth daily.   Yes Historical Provider, MD  gabapentin (NEURONTIN) 100 MG capsule Take 200 mg by mouth at bedtime.   Yes Historical Provider, MD  HYDROcodone-acetaminophen (NORCO) 5-325 MG per tablet Take 1 tablet by mouth every 6 (six) hours as needed. Pain.   Yes Historical Provider, MD  hydrocortisone-pramoxine [facility]) 2.5-1 % rectal cream Place 1 application rectally 2 (two) times daily as needed. Inflammation. May self-administer.   Yes Historical Provider, MD  ipratropium-albuterol (DUONEB) 0.5-2.5 (3) MG/3ML SOLN Take 3 mLs by nebulization every 6 (six) hours as needed. Wheezing and shortness of breath.   Yes Historical Provider, MD  levothyroxine (SYNTHROID, LEVOTHROID) 88 MCG tablet Take 88 mcg by mouth daily. May keep at bedside. (Place with bedtime meds for resident to self-administer early morning.)   Yes Historical Provider, MD  lubiprostone (AMITIZA) 8 MCG capsule Take 8 mcg by mouth at bedtime.   Yes Historical Provider, MD  meloxicam (MOBIC) 7.5 MG tablet Take 7.5 mg by mouth daily.   Yes Historical Provider, MD  mirtazapine (REMERON) 15 MG tablet Take 15 mg by mouth at bedtime.   Yes Historical Provider, MD  Multiple Vitamins-Minerals (EYE VITAMINS) TABS Take 1 tablet by mouth 2 (two) times daily.   Yes Historical Provider, MD  nisoldipine (SULAR) 17 MG 24 hr tablet Take 17 mg by mouth daily.   Yes Historical Provider, MD  olopatadine (PATANOL) 0.1 % ophthalmic solution Place into both eyes 2 (two) times daily as needed. Allergy irritation. May self-administer.   Yes Historical Provider, MD  omeprazole (PRILOSEC) 40 MG capsule Take 40 mg by mouth daily.   Yes Historical  Provider, MD  Polyethyl Glycol-Propyl Glycol (SYSTANE ULTRA) 0.4-0.3 % SOLN Apply 1 drop to eye as needed. Dryness itching.   Yes Historical Provider, MD  polyethylene glycol (MIRALAX / GLYCOLAX) packet Take 17 g by mouth daily.   Yes Historical Provider, MD  tiotropium (SPIRIVA) 18 MCG inhalation capsule Place 18 mcg into inhaler and inhale daily. May self administer.   Yes Historical Provider, MD  warfarin (COUMADIN) 5 MG tablet Take 5 mg by mouth daily.   Yes Historical Provider, MD   Physical Exam: Filed Vitals:   09/15/11 2017 09/15/11 2100 09/15/11 2225 09/16/11 0025  BP: 129/52  109/41 111/43  Pulse: 79 74 70 67  Temp: 102.8 F (39.3 C)  101.1 F (38.4 C)   TempSrc: Rectal  Rectal   Resp: 18 23 21 20   SpO2: 96% 92% 96% 97%     General:  NAD, pt is febrile, follows commands appropriately  Eyes: PERRLA, EOMI  ENT: no OP erythema  Neck: supple, no thyroid enlargement  Cardiovascular: Regular rate and rhythm, SEM 3/6, no clicks  Respiratory: Good respiratory effort, no wheezing, no crackles, breath sounds slightly decreased at bases  Abdomen: Tenderness in suprapubic area, hernia noted, bowel sounds present, no rebound or guarding  Skin: poor turgor  Psychiatric: Normal affect  Neurologic: Grossly nonfocal  Labs on Admission:  Basic Metabolic Panel:  Lab 09/15/11 4098  NA 132*  K 4.1  CL 97  CO2 23  GLUCOSE 123*  BUN 12  CREATININE 0.91  CALCIUM 9.0  MG --  PHOS --   Liver Function Tests:  Lab 09/15/11 2114  AST 32  ALT 17  ALKPHOS 82  BILITOT 0.5  PROT 7.6  ALBUMIN 3.4*   CBC:  Lab 09/15/11 2114  WBC 16.4*  NEUTROABS --  HGB 9.5*  HCT 30.8*  MCV 78.8  PLT 243   Cardiac Enzymes:  Lab 09/15/11 2114  CKTOTAL --  CKMB --  CKMBINDEX --  TROPONINI <0.30   BNP: No components found with this basename: POCBNP:5 CBG: No results found for this basename: GLUCAP:5 in the last 168 hours  Radiological Exams on Admission: Dg Chest 2  View  09/15/2011  *RADIOLOGY REPORT*  Clinical Data: Fever, cough and chills.  CHEST - 2 VIEW  Comparison: None.  Findings: The lungs are well-aerated.  Minimal bibasilar opacities likely reflect atelectasis.  Chronic peribronchial thickening is noted.  There is no evidence of focal opacification, pleural effusion or pneumothorax.  The heart is borderline normal in size; the mediastinal contour is within normal limits.  No acute osseous abnormalities are seen.  A moderate hiatal hernia is seen.  IMPRESSION:  1.  Minimal bibasilar opacities likely reflect atelectasis; lungs otherwise clear. 2.  Moderate hiatal hernia noted.  Original Report Authenticated By: Tonia Ghent, M.D.   Ct Abdomen Pelvis W Contrast  09/16/2011  *RADIOLOGY REPORT*  Clinical Data: Fever, chills.  CT ABDOMEN AND PELVIS WITH CONTRAST  Technique:  Multidetector CT imaging of the abdomen and pelvis was performed following the standard protocol during bolus administration of intravenous contrast.  Contrast: OMNIPAQUE IOHEXOL 300 MG/ML  SOLN  Comparison: None.  Findings: Moderate sized hiatal hernia.  Linear scarring in the lung bases.  No effusions.  Bullous change in the right lung base.  Slightly nodular contours of the liver surface could reflect mild changes of cirrhosis.  Recommend clinical correlation.  No focal abnormality.  Spleen, pancreas, adrenals and kidneys are unremarkable except for small cysts in the kidneys bilaterally. Appendix is visualized and is normal.  Small bowel is decompressed.  Inflammatory change around the sigmoid colon, likely sigmoid diverticulitis.  There is extraluminal gas compatible with contained perforation.  No well-defined abscess currently.  Aorta and iliac vessels are heavily calcified, non-aneurysmal.  Degenerative changes throughout the lumbar spine.  Grade 1 anterolisthesis of L4 on L5 related to facet disease.  No acute bony abnormality.  IMPRESSION: Stranding around the sigmoid colon  compatible with acute diverticulitis.  There is extraluminal gas adjacent to the sigmoid colon compatible with contained perforation.  Normal appendix.  Fine nodular contours to the liver suggesting the possibility of mild cirrhosis.  Recommend clinical correlation.  Moderate sized hiatal hernia.  Original Report Authenticated By: Cyndie Chime, M.D.    EKG: pending  Assessment/Plan Principal Problem:  *Diverticulitis - will admit the pt to medical floor for further evaluation and management - will provide supportive care with IVF, analgesia for adequate pain control, antiemetics as needed - will also initiate Ciprofloxacin and Flagyl - perforation noted and appears contained, surgery recommends symptomatic management  UTI - UA not remarkable for acute finding but symptoms are suggestive of UTI as well - Ciprofloxacin should cover - monitor I's and O's  Leukocytosis - secondary to acute infection, diverticulitis and UTI -  will treat with antibiotics - CBC in AM  Hyponatremia - likely pre renal etiology - will provide hydration  Anemia of chronic disease - will obtain CBC in AM  Chronic kidney disease - stage II, based on GFR - creatinine is stable and within normal limits   Code Status: Discuss in AM Family Communication: Pt at bedside Disposition Plan:PT evaluation  Manson Passey, MD  [facility] Pager [phone]  If 7PM-7AM, please contact night-coverage [email or web address] Password [facility] 09/16/2011, 12:54 AM

## 2011-09-16 NOTE — Progress Notes (Signed)
Subjective: No new issues overnight. Abdominal pain is less. Still has occasional chills.  Objective: Vital signs in last 24 hours: Temp:  [98.1 F (36.7 C)-102.8 F (39.3 C)] 98.1 F (36.7 C) (06/23 0433) Pulse Rate:  [67-87] 83  (06/23 0433) Resp:  [18-24] 22  (06/23 0433) BP: (109-140)/(41-65) 140/65 mmHg (06/23 0433) SpO2:  [92 %-98 %] 95 % (06/23 0433) Weight:  [74.844 kg (165 lb)] 74.844 kg (165 lb) (06/23 0218) Weight change:  Last BM Date: 09/15/11  Intake/Output from previous day: 06/22 0701 - 06/23 0700 In: 420 [P.O.:320; I.V.:100] Out: 550 [Urine:550] Total I/O In: 727.5 [P.O.:120; I.V.:607.5] Out: 100 [Urine:100]   Physical Exam: General: Comfortable, alert, communicative, fully oriented, not short of breath at rest.  HEENT:  Mild-moderate clinical pallor, no jaundice, no conjunctival injection or discharge. Hydration status is satisfactory. NECK:  Supple, JVP not seen, no palpable lymphadenopathy, no palpable goiter. CHEST:  Clinically clear to auscultation, no wheezes, no crackles. HEART:  Sounds 1 and 2 heard, normal, regular, no murmurs. ABDOMEN:  Full, soft, only mild discomfort LLQ, no palpable organomegaly, no palpable masses, normal bowel sounds. GENITALIA:  Not examined. LOWER EXTREMITIES:  No pitting edema, palpable peripheral pulses. MUSCULOSKELETAL SYSTEM:  Generalized osteoarthritic changes, otherwise, normal. CENTRAL NERVOUS SYSTEM:  No focal neurologic deficit on gross examination.  Lab Results:  [facility] 09/16/11 0615 09/15/11 2114  WBC 15.7* 16.4*  HGB 8.8* 9.5*  HCT 28.3* 30.8*  PLT 201 243    Basename 09/16/11 0615 09/15/11 2114  NA 135 132*  K 4.0 4.1  CL 102 97  CO2 24 23  GLUCOSE 98 123*  BUN 12 12  CREATININE 0.93 0.91  CALCIUM 8.5 9.0   No results found for this or any previous visit (from the past 240 hour(s)).   Studies/Results: Dg Chest 2 View  09/15/2011  *RADIOLOGY REPORT*  Clinical Data: Fever, cough and chills.   CHEST - 2 VIEW  Comparison: None.  Findings: The lungs are well-aerated.  Minimal bibasilar opacities likely reflect atelectasis.  Chronic peribronchial thickening is noted.  There is no evidence of focal opacification, pleural effusion or pneumothorax.  The heart is borderline normal in size; the mediastinal contour is within normal limits.  No acute osseous abnormalities are seen.  A moderate hiatal hernia is seen.  IMPRESSION:  1.  Minimal bibasilar opacities likely reflect atelectasis; lungs otherwise clear. 2.  Moderate hiatal hernia noted.  Original Report Authenticated By: Tonia Ghent, M.D.   Ct Abdomen Pelvis W Contrast  09/16/2011  *RADIOLOGY REPORT*  Clinical Data: Fever, chills.  CT ABDOMEN AND PELVIS WITH CONTRAST  Technique:  Multidetector CT imaging of the abdomen and pelvis was performed following the standard protocol during bolus administration of intravenous contrast.  Contrast: OMNIPAQUE IOHEXOL 300 MG/ML  SOLN  Comparison: None.  Findings: Moderate sized hiatal hernia.  Linear scarring in the lung bases.  No effusions.  Bullous change in the right lung base.  Slightly nodular contours of the liver surface could reflect mild changes of cirrhosis.  Recommend clinical correlation.  No focal abnormality.  Spleen, pancreas, adrenals and kidneys are unremarkable except for small cysts in the kidneys bilaterally. Appendix is visualized and is normal.  Small bowel is decompressed.  Inflammatory change around the sigmoid colon, likely sigmoid diverticulitis.  There is extraluminal gas compatible with contained perforation.  No well-defined abscess currently.  Aorta and iliac vessels are heavily calcified, non-aneurysmal.  Degenerative changes throughout the lumbar spine.  Grade 1 anterolisthesis of  L4 on L5 related to facet disease.  No acute bony abnormality.  IMPRESSION: Stranding around the sigmoid colon compatible with acute diverticulitis.  There is extraluminal gas adjacent to the sigmoid  colon compatible with contained perforation.  Normal appendix.  Fine nodular contours to the liver suggesting the possibility of mild cirrhosis.  Recommend clinical correlation.  Moderate sized hiatal hernia.  Original Report Authenticated By: Cyndie Chime, M.D.    Medications: Scheduled Meds:   . acetaminophen  650 mg Oral Once  . atorvastatin  10 mg Oral Daily  . cholecalciferol  2,000 Units Oral Daily  . ciprofloxacin  400 mg Intravenous Q12H  . ciprofloxacin  500 mg Oral Once  . donepezil  10 mg Oral QHS  . escitalopram  20 mg Oral Daily  . folic acid  1 mg Oral Daily  . gabapentin  200 mg Oral QHS  . levothyroxine  88 mcg Oral QAC breakfast  . loratadine  10 mg Oral Daily  . lubiprostone  8 mcg Oral QHS  . meloxicam  7.5 mg Oral Daily  . metronidazole  500 mg Intravenous Once  . metronidazole  500 mg Intravenous Q8H  . mirtazapine  15 mg Oral QHS  . multivitamin  1 tablet Oral BID  . nisoldipine  17 mg Oral Daily  . olopatadine  1 drop Both Eyes BID  . pantoprazole  40 mg Oral Q1200  . polyethylene glycol  17 g Oral Daily  . tiotropium  18 mcg Inhalation Daily  . warfarin  5 mg Oral q1800  . Warfarin - Physician Dosing Inpatient   Does not apply q1800  . DISCONTD: sodium chloride   Intravenous STAT  . DISCONTD: sodium chloride  3 mL Intravenous Q12H   Continuous Infusions:   . sodium chloride 75 mL/hr at 09/16/11 1005   PRN Meds:.acyclovir ointment, albuterol, HYDROcodone-acetaminophen, hydrocortisone-pramoxine, iohexol, iohexol, morphine injection, ondansetron (ZOFRAN) IV, polyvinyl alcohol, DISCONTD: sodium chloride, DISCONTD: Polyethyl Glycol-Propyl Glycol, DISCONTD: sodium chloride  Assessment/Plan:  Active Problems:  1. Acute Diverticulitis:  Patient presented with abdominal pain, fevers, chills and leukocytosis. Abdominal /Pelvic CT demonstrated stranding around the sigmoid colon compatible with acute diverticulitis. There was extraluminal gas adjacent to the  sigmoid colon compatible with contained perforation. Patient is being managed with bowel rest, ivi fluids, analgesics, and iv Ciprofloxacin/Flagyl, now day# 1. Have discussed with Dr Pamella Pert, and conservative management appears indicated for now. Dr Ezzard Standing will see patient later, for formal recommendations. Wcc is already trending down, and no pyrexia has been recorded. 2. Query UTI:  This is not substantiated by urinalysis findings. Although patient did have urinary frequency/urgency at presentation, this is likely secondary to bladder irritability, from adjacent inflammed bowel.  3. Hyponatremia: Mild., and already resolved with iv NS. Following lytes.  4. Patient has a normocytic anemia, and HB appears reasonable. Following CBC. Will check hematinics.  5. Chronic kidney disease: This is stage II, based on GFR. Creatinine is stable and within normal limits. 6:  Hypothyroidism: Continued on thyroxin replacement therapy. 7. Chronic anticoagulation: Patient is on chronic anticoagulation with warfarin, for carotid artery occlusive disease. INR is therapeutic at 2.27. Will hold Warfarin for now, in case patient needs invasive surgical or IR procedure, in the next few days.        LOS: 1 day   [place] Huberty,CHRISTOPHER 09/16/2011, 11:28 AM

## 2011-09-17 DIAGNOSIS — E782 Mixed hyperlipidemia: Secondary | ICD-10-CM

## 2011-09-17 DIAGNOSIS — K5732 Diverticulitis of large intestine without perforation or abscess without bleeding: Secondary | ICD-10-CM

## 2011-09-17 DIAGNOSIS — I10 Essential (primary) hypertension: Secondary | ICD-10-CM

## 2011-09-17 DIAGNOSIS — Z7901 Long term (current) use of anticoagulants: Secondary | ICD-10-CM

## 2011-09-17 LAB — COMPREHENSIVE METABOLIC PANEL
Albumin: 2.7 g/dL — ABNORMAL LOW (ref 3.5–5.2)
Alkaline Phosphatase: 65 U/L (ref 39–117)
BUN: 11 mg/dL (ref 6–23)
Calcium: 8.5 mg/dL (ref 8.4–10.5)
GFR calc Af Amer: 55 mL/min — ABNORMAL LOW (ref >90)
Potassium: 3.7 mEq/L (ref 3.5–5.1)
Total Protein: 6.5 g/dL (ref 6.0–8.3)

## 2011-09-17 LAB — CBC
HCT: 27 % — ABNORMAL LOW (ref 36.0–46.0)
MCH: 23.8 pg — ABNORMAL LOW (ref 26.0–34.0)
MCHC: 30 g/dL (ref 30.0–36.0)
RDW: 17.5 % — ABNORMAL HIGH (ref 11.5–15.5)

## 2011-09-17 LAB — PROTIME-INR
INR: 1.68 — ABNORMAL HIGH (ref 0.00–1.49)
Prothrombin Time: 20.1 seconds — ABNORMAL HIGH (ref 11.6–15.2)

## 2011-09-17 LAB — GLUCOSE, CAPILLARY
Glucose-Capillary: 97 mg/dL (ref 70–99)
Glucose-Capillary: 97 mg/dL (ref 70–99)

## 2011-09-17 LAB — VITAMIN B12: Vitamin B-12: 575 pg/mL (ref 211–911)

## 2011-09-17 LAB — FOLATE RBC: RBC Folate: 1883 ng/mL — ABNORMAL HIGH (ref >=366)

## 2011-09-17 MED ORDER — BISACODYL 10 MG RE SUPP: 10.0000 mg | Freq: Every day | RECTAL | Status: DC | PRN

## 2011-09-17 NOTE — Progress Notes (Signed)
Clinical Social Work Department BRIEF PSYCHOSOCIAL ASSESSMENT 09/17/2011  Patient:  AVRI, PAIVA     Account Number:  [id number]     Admit date:  09/15/2011  Clinical Social Worker:  Candie Chroman  Date/Time:  09/17/2011 04:38 PM  Referred by:  Physician  Date Referred:  09/17/2011 Referred for  ALF Placement   Other Referral:   Interview type:  Patient Other interview type:    PSYCHOSOCIAL DATA Living Status:  FACILITY Admitted from facility:  [facility] Level of care:  Assisted Living Primary support name:  Benay Spice Primary support relationship to patient:  Unclear Degree of support available:   unclear    CURRENT CONCERNS Current Concerns  Post-Acute Placement   Other Concerns:    SOCIAL WORK ASSESSMENT / PLAN Pt is a 78 yr old female admitted from [name]. Met with pt to assist with d/c planning. Pt plans to return to ALF upon hospital d/c. [facility] contacted and d/c plan confirmed. Clinicals sent as requested by facility. [name] will readmit but it is unclear,at this point, if pt will return to ALF or rehab unit. CSW will follow to assist with d/c planning.   Assessment/plan status:  Psychosocial Support/Ongoing Assessment of Needs Other assessment/ plan:   Information/referral to community resources:   None needed at this time.    PATIENT'S/FAMILY'S RESPONSE TO PLAN OF CARE: Pt would like to return to [name] upon d/c.    Cori Razor LCSW [phone]

## 2011-09-17 NOTE — Progress Notes (Signed)
Subjective: Say she feels much better. Less tender lower abdomen, she also says she was able to sleep last PM.  Objective: Vital signs in last 24 hours: Temp:  [98.2 F (36.8 C)-99.4 F (37.4 C)] 98.2 F (36.8 C) (06/24 0500) Pulse Rate:  [58-79] 58  (06/24 0500) Resp:  [16-20] 20  (06/24 0500) BP: (122-125)/(61-63) 124/63 mmHg (06/24 0500) SpO2:  [94 %-96 %] 96 % (06/24 0500) Last BM Date: 09/15/11  180 cc/PO, 2 stools recorded, Tm 99.4, VSS, WBC improving,H/H down some, INR down to 1.68 (on chronic coumadin)  Intake/Output from previous day: 06/23 0701 - 06/24 0700 In: 2980 [P.O.:180; I.V.:2100; IV Piggyback:700] Out: 1650 [Urine:1650] Intake/Output this shift:  EXAM: CHEST: clear CArd:  SEM Aortic Abd:  Soft, +BS, not tender today. Not distended.   Lab Results:   Basename 09/17/11 0345 09/16/11 0615  WBC 11.4* 15.7*  HGB 8.1* 8.8*  HCT 27.0* 28.3*  PLT 196 201    BMET  Basename 09/17/11 0345 09/16/11 0615  NA 136 135  K 3.7 4.0  CL 104 102  CO2 24 24  GLUCOSE 101* 98  BUN 11 12  CREATININE 1.01 0.93  CALCIUM 8.5 8.5   PT/INR  Basename 09/17/11 0345 09/16/11 0129  LABPROT 20.1* 25.4*  INR 1.68* 2.27*     Lab 09/17/11 0345 09/16/11 0615 09/15/11 2114  AST 25 27 32  ALT 13 14 17   ALKPHOS 65 71 82  BILITOT 0.4 0.6 0.5  PROT 6.5 6.8 7.6  ALBUMIN 2.7* 2.9* 3.4*     Lipase  No results found for this basename: lipase     Studies/Results: Dg Chest 2 View  09/15/2011  *RADIOLOGY REPORT*  Clinical Data: Fever, cough and chills.  CHEST - 2 VIEW  Comparison: None.  Findings: The lungs are well-aerated.  Minimal bibasilar opacities likely reflect atelectasis.  Chronic peribronchial thickening is noted.  There is no evidence of focal opacification, pleural effusion or pneumothorax.  The heart is borderline normal in size; the mediastinal contour is within normal limits.  No acute osseous abnormalities are seen.  A moderate hiatal hernia is seen.   IMPRESSION:  1.  Minimal bibasilar opacities likely reflect atelectasis; lungs otherwise clear. 2.  Moderate hiatal hernia noted.  Original Report Authenticated By: Tonia Ghent, M.D.   Ct Abdomen Pelvis W Contrast  09/16/2011  *RADIOLOGY REPORT*  Clinical Data: Fever, chills.  CT ABDOMEN AND PELVIS WITH CONTRAST  Technique:  Multidetector CT imaging of the abdomen and pelvis was performed following the standard protocol during bolus administration of intravenous contrast.  Contrast: OMNIPAQUE IOHEXOL 300 MG/ML  SOLN  Comparison: None.  Findings: Moderate sized hiatal hernia.  Linear scarring in the lung bases.  No effusions.  Bullous change in the right lung base.  Slightly nodular contours of the liver surface could reflect mild changes of cirrhosis.  Recommend clinical correlation.  No focal abnormality.  Spleen, pancreas, adrenals and kidneys are unremarkable except for small cysts in the kidneys bilaterally. Appendix is visualized and is normal.  Small bowel is decompressed.  Inflammatory change around the sigmoid colon, likely sigmoid diverticulitis.  There is extraluminal gas compatible with contained perforation.  No well-defined abscess currently.  Aorta and iliac vessels are heavily calcified, non-aneurysmal.  Degenerative changes throughout the lumbar spine.  Grade 1 anterolisthesis of L4 on L5 related to facet disease.  No acute bony abnormality.  IMPRESSION: Stranding around the sigmoid colon compatible with acute diverticulitis.  There is extraluminal gas adjacent  to the sigmoid colon compatible with contained perforation.  Normal appendix.  Fine nodular contours to the liver suggesting the possibility of mild cirrhosis.  Recommend clinical correlation.  Moderate sized hiatal hernia.  Original Report Authenticated By: Cyndie Chime, M.D.    Medications:    . cholecalciferol  2,000 Units Oral Daily  . ciprofloxacin  400 mg Intravenous Q12H  . levothyroxine  44 mcg Intravenous Daily    . metronidazole  500 mg Intravenous Q8H  . mirtazapine  15 mg Oral QHS  . nisoldipine  17 mg Oral Daily  . olopatadine  1 drop Both Eyes BID  . pantoprazole (PROTONIX) IV  40 mg Intravenous Q24H  . polyethylene glycol  17 g Oral Daily  . tiotropium  18 mcg Inhalation Daily  . DISCONTD: atorvastatin  10 mg Oral Daily  . DISCONTD: donepezil  10 mg Oral QHS  . DISCONTD: escitalopram  20 mg Oral Daily  . DISCONTD: folic acid  1 mg Oral Daily  . DISCONTD: gabapentin  200 mg Oral QHS  . DISCONTD: levothyroxine  88 mcg Oral QAC breakfast  . DISCONTD: loratadine  10 mg Oral Daily  . DISCONTD: lubiprostone  8 mcg Oral QHS  . DISCONTD: meloxicam  7.5 mg Oral Daily  . DISCONTD: multivitamin  1 tablet Oral BID  . DISCONTD: pantoprazole  40 mg Oral Q1200  . DISCONTD: warfarin  5 mg Oral q1800  . DISCONTD: Warfarin - Physician Dosing Inpatient   Does not apply q1800    Assessment/Plan Diverticulitis, sigmoid colon with contained perforation CC: weakness/abdominal pain UTI - urine culture pending Chronic renal disease  Anemia Anticoagulated INR 1.68 (carotid artery occlusion) Changes in liver ? CIRRHOSIS Gout Hypertension Dyslipidemia Hiatal Hernia   Plan:  Continue IV antibiotics and bowel rest, anticoagulation per Dr. Brien Few.   LOS: 2 days    [name] 09/17/2011

## 2011-09-17 NOTE — Progress Notes (Signed)
Subjective: No new issues overnight.  Objective: Vital signs in last 24 hours: Temp:  [98.2 F (36.8 C)-98.9 F (37.2 C)] 98.4 F (36.9 C) (06/24 1400) Pulse Rate:  [58-79] 72  (06/24 1400) Resp:  [18-20] 19  (06/24 1400) BP: (124-142)/(61-63) 142/63 mmHg (06/24 1400) SpO2:  [94 %-97 %] 97 % (06/24 1400) Weight change:  Last BM Date: 09/15/11  Intake/Output from previous day: 06/23 0701 - 06/24 0700 In: 2980 [P.O.:180; I.V.:2100; IV Piggyback:700] Out: 1650 [Urine:1650] Total I/O In: 326.3 [I.V.:326.3] Out: 350 [Urine:350]   Physical Exam: General: Comfortable, alert, communicative, fully oriented, not short of breath at rest.  HEENT:  Moderate clinical pallor, no jaundice, no conjunctival injection or discharge. Hydration status is satisfactory. NECK:  Supple, JVP not seen, no palpable lymphadenopathy, no palpable goiter. CHEST:  Clinically clear to auscultation, no wheezes, no crackles. HEART:  Sounds 1 and 2 heard, normal, regular, no murmurs. ABDOMEN:  Full, soft, only very mild discomfort LLQ, no palpable organomegaly, no palpable masses, normal bowel sounds. GENITALIA:  Not examined. LOWER EXTREMITIES:  No pitting edema, palpable peripheral pulses. MUSCULOSKELETAL SYSTEM:  Generalized osteoarthritic changes, otherwise, normal. CENTRAL NERVOUS SYSTEM:  No focal neurologic deficit on gross examination.  Lab Results:  Basename 09/17/11 0345 09/16/11 0615  WBC 11.4* 15.7*  HGB 8.1* 8.8*  HCT 27.0* 28.3*  PLT 196 201    Basename 09/17/11 0345 09/16/11 0615  NA 136 135  K 3.7 4.0  CL 104 102  CO2 24 24  GLUCOSE 101* 98  BUN 11 12  CREATININE 1.01 0.93  CALCIUM 8.5 8.5   Recent Results (from the past 240 hour(s))  URINE CULTURE     Status: Normal   Collection Time   09/15/11  8:35 PM      Component Value Range Status Comment   Specimen Description URINE, RANDOM   Final    Special Requests NONE   Final    Culture  Setup Time [id number]   Final    Colony  Count 9,000 COLONIES/ML   Final    Culture INSIGNIFICANT GROWTH   Final    Report Status 09/16/2011 FINAL   Final   CULTURE, BLOOD (ROUTINE X 2)     Status: Normal (Preliminary result)   Collection Time   09/15/11  9:14 PM      Component Value Range Status Comment   Specimen Description BLOOD L ARM   Final    Special Requests BOTTLES DRAWN AEROBIC AND ANAEROBIC   Final    Culture  Setup Time [id number]   Final    Culture     Final    Value:        BLOOD CULTURE RECEIVED NO GROWTH TO DATE CULTURE WILL BE HELD FOR 5 DAYS BEFORE ISSUING A FINAL NEGATIVE REPORT   Report Status PENDING   Incomplete   CULTURE, BLOOD (ROUTINE X 2)     Status: Normal (Preliminary result)   Collection Time   09/15/11  9:29 PM      Component Value Range Status Comment   Specimen Description BLOOD R ARM   Final    Special Requests BOTTLES DRAWN AEROBIC AND ANAEROBIC   Final    Culture  Setup Time [id number]   Final    Culture     Final    Value:        BLOOD CULTURE RECEIVED NO GROWTH TO DATE CULTURE WILL BE HELD FOR 5 DAYS BEFORE ISSUING A FINAL NEGATIVE REPORT  Report Status PENDING   Incomplete      Studies/Results: Dg Chest 2 View  09/15/2011  *RADIOLOGY REPORT*  Clinical Data: Fever, cough and chills.  CHEST - 2 VIEW  Comparison: None.  Findings: The lungs are well-aerated.  Minimal bibasilar opacities likely reflect atelectasis.  Chronic peribronchial thickening is noted.  There is no evidence of focal opacification, pleural effusion or pneumothorax.  The heart is borderline normal in size; the mediastinal contour is within normal limits.  No acute osseous abnormalities are seen.  A moderate hiatal hernia is seen.  IMPRESSION:  1.  Minimal bibasilar opacities likely reflect atelectasis; lungs otherwise clear. 2.  Moderate hiatal hernia noted.  Original Report Authenticated By: Tonia Ghent, M.D.   Ct Abdomen Pelvis W Contrast  09/16/2011  *RADIOLOGY REPORT*  Clinical Data: Fever, chills.  CT  ABDOMEN AND PELVIS WITH CONTRAST  Technique:  Multidetector CT imaging of the abdomen and pelvis was performed following the standard protocol during bolus administration of intravenous contrast.  Contrast: OMNIPAQUE IOHEXOL 300 MG/ML  SOLN  Comparison: None.  Findings: Moderate sized hiatal hernia.  Linear scarring in the lung bases.  No effusions.  Bullous change in the right lung base.  Slightly nodular contours of the liver surface could reflect mild changes of cirrhosis.  Recommend clinical correlation.  No focal abnormality.  Spleen, pancreas, adrenals and kidneys are unremarkable except for small cysts in the kidneys bilaterally. Appendix is visualized and is normal.  Small bowel is decompressed.  Inflammatory change around the sigmoid colon, likely sigmoid diverticulitis.  There is extraluminal gas compatible with contained perforation.  No well-defined abscess currently.  Aorta and iliac vessels are heavily calcified, non-aneurysmal.  Degenerative changes throughout the lumbar spine.  Grade 1 anterolisthesis of L4 on L5 related to facet disease.  No acute bony abnormality.  IMPRESSION: Stranding around the sigmoid colon compatible with acute diverticulitis.  There is extraluminal gas adjacent to the sigmoid colon compatible with contained perforation.  Normal appendix.  Fine nodular contours to the liver suggesting the possibility of mild cirrhosis.  Recommend clinical correlation.  Moderate sized hiatal hernia.  Original Report Authenticated By: Cyndie Chime, M.D.    Medications: Scheduled Meds:    . cholecalciferol  2,000 Units Oral Daily  . ciprofloxacin  400 mg Intravenous Q12H  . levothyroxine  44 mcg Intravenous Daily  . metronidazole  500 mg Intravenous Q8H  . mirtazapine  15 mg Oral QHS  . nisoldipine  17 mg Oral Daily  . olopatadine  1 drop Both Eyes BID  . pantoprazole (PROTONIX) IV  40 mg Intravenous Q24H  . polyethylene glycol  17 g Oral Daily  . tiotropium  18 mcg  Inhalation Daily   Continuous Infusions:    . sodium chloride 75 mL/hr at 09/17/11 1021   PRN Meds:.acyclovir ointment, albuterol, HYDROcodone-acetaminophen, hydrocortisone-pramoxine, morphine injection, polyvinyl alcohol  Assessment/Plan:  Active Problems:  1. Acute Diverticulitis:  Patient presented with abdominal pain, fevers, chills and leukocytosis. Abdominal /Pelvic CT demonstrated stranding around the sigmoid colon compatible with acute diverticulitis. There was extraluminal gas adjacent to the sigmoid colon compatible with contained perforation. Patient is being managed with bowel rest, ivi fluids, analgesics, and iv Ciprofloxacin/Flagyl, now day# 1. Have discussed with Dr Abbey Chatters, surgeon, and conservative management appears indicated for now. Dr Ezzard Standing saw patient subsequently, and has recommended watchful waiting and conservative treatment. Wcc has trended down to 11.4  Today, no pyrexia has been recorded, and patient feels better. 2. Query UTI:  This is not substantiated by urinalysis findings. Although patient did have urinary frequency/urgency at presentation, this is likely secondary to bladder irritability, from adjacent inflammed bowel.  3. Hyponatremia: Mild, and already resolved with iv NS. Following lytes.  4. Patient has a normocytic anemia, and HB is drifting down gradually, and clinically, she has no evidence of acute bleed. Will continue to follow CBC. Type and screen PRBC.  5. Chronic kidney disease: This is stage II, based on GFR. Creatinine is stable and within normal limits. 6: Hypothyroidism: Continued on thyroxine replacement therapy. 7. Chronic anticoagulation: Patient is on chronic anticoagulation with warfarin, for carotid artery occlusive disease. INR was therapeutic at 2.27 on admission. Warfarin is for now, in case patient needs invasive surgical or IR procedure, in the next few days. I did discuss with surgeon today, and the recommendation, is to continue to  hold, until diet is resumed and tolerated.   Comment: Will defer all questions re diet and possible follow up imaging, to surgical team.        LOS: 2 days   Talitha Dicarlo,CHRISTOPHER 09/17/2011, 3:27 PM

## 2011-09-17 NOTE — Evaluation (Signed)
Physical Therapy Evaluation Patient Details Name: [name] MRN: [id number] DOB: 03-20-1920 Today's Date: 09/17/2011 Time: 1027-2536 PT Time Calculation (min): 10 min  PT Assessment / Plan / Recommendation Clinical Impression  78 yo female admitted with diverticulitis. Has hx of COPD and wears O2 at night. Uses rollator for ambulation. Mobilizing fairly well. Will follow during stay. Do not anticipate any follow up PT needs at discharge.     PT Assessment  Patient needs continued PT services    Follow Up Recommendations  No PT follow up    Barriers to Discharge        lEquipment Recommendations  None recommended by PT    Recommendations for Other Services OT consult   Frequency Min 3X/week    Precautions / Restrictions Precautions Precautions: None Restrictions Weight Bearing Restrictions: No   Pertinent Vitals/Pain       Mobility  Bed Mobility Bed Mobility: Supine to Sit Supine to Sit: 6: Modified independent (Device/Increase time) Transfers Transfers: Sit to Stand;Stand to Sit Sit to Stand: 5: Supervision;From bed;From toilet Stand to Sit: 5: Supervision;To toilet;To bed Details for Transfer Assistance: VCs safety, hand placement  Ambulation/Gait Ambulation/Gait Assistance: 4: Min guard Ambulation Distance (Feet): 85 Feet Assistive device: Rolling walker Ambulation/Gait Assistance Details: Dyspnea 2-3/4 with activity. Ambulated on RA (pt states she only wears O2 at night). Fatigues fairly easily.  Gait Pattern: Step-through pattern;Trunk flexed    Exercises     PT Diagnosis: Difficulty walking  PT Problem List: Decreased mobility;Decreased activity tolerance PT Treatment Interventions: Gait training;Functional mobility training;Therapeutic activities;Therapeutic exercise;Patient/family education   PT Goals Acute Rehab PT Goals PT Goal Formulation: With patient Time For Goal Achievement: 10/01/11 Potential to Achieve Goals: Good Pt will go Supine/Side  to Sit: with modified independence PT Goal: Supine/Side to Sit - Progress: Goal set today Pt will go Sit to Supine/Side: with modified independence PT Goal: Sit to Supine/Side - Progress: Goal set today Pt will go Sit to Stand: with modified independence PT Goal: Sit to Stand - Progress: Goal set today Pt will Ambulate: 51 - 150 feet;with modified independence;with rolling walker PT Goal: Ambulate - Progress: Goal set today  Visit Information  Last PT Received On: 09/17/11 Assistance Needed: +1    Subjective Data  Subjective: "I do fine as long as I'm in my apartment" Patient Stated Goal: Home   Prior Functioning  Home Living Lives With: Alone Type of Home: Assisted living Home Access: Level entry Home Layout: One level Bathroom Shower/Tub: Health visitor: Standard Home Adaptive Equipment: Environmental consultant - four wheeled (oxygen) Prior Function Level of Independence: Independent with assistive device(s) Comments: Uses rollator PRN - not usually in apartment.  Communication Communication: No difficulties    Cognition  Overall Cognitive Status: Appears within functional limits for tasks assessed/performed Arousal/Alertness: Awake/alert Orientation Level: Appears intact for tasks assessed Behavior During Session: [facility] for tasks performed    Extremity/Trunk Assessment Right Lower Extremity Assessment RLE ROM/Strength/Tone: [facility] for tasks assessed RLE Coordination: WFL - gross motor Left Lower Extremity Assessment LLE ROM/Strength/Tone: WFL for tasks assessed LLE Coordination: WFL - gross motor Trunk Assessment Trunk Assessment: Kyphotic   Balance    End of Session PT - End of Session Equipment Utilized During Treatment: Gait belt Activity Tolerance: Patient limited by fatigue Patient left: in bed;with call bell/phone within reach   [name] [facility] 09/17/2011, 10:54 AM [phone]

## 2011-09-18 DIAGNOSIS — E782 Mixed hyperlipidemia: Secondary | ICD-10-CM

## 2011-09-18 DIAGNOSIS — I10 Essential (primary) hypertension: Secondary | ICD-10-CM

## 2011-09-18 DIAGNOSIS — Z7901 Long term (current) use of anticoagulants: Secondary | ICD-10-CM

## 2011-09-18 DIAGNOSIS — K5732 Diverticulitis of large intestine without perforation or abscess without bleeding: Secondary | ICD-10-CM

## 2011-09-18 LAB — CBC
Hemoglobin: 8.3 g/dL — ABNORMAL LOW (ref 12.0–15.0)
MCH: 23.9 pg — ABNORMAL LOW (ref 26.0–34.0)
MCHC: 29.9 g/dL — ABNORMAL LOW (ref 30.0–36.0)
Platelets: 221 10*3/uL (ref 150–400)
RDW: 17.5 % — ABNORMAL HIGH (ref 11.5–15.5)

## 2011-09-18 LAB — BASIC METABOLIC PANEL
Calcium: 8.3 mg/dL — ABNORMAL LOW (ref 8.4–10.5)
GFR calc Af Amer: 68 mL/min — ABNORMAL LOW (ref >90)
GFR calc non Af Amer: 59 mL/min — ABNORMAL LOW (ref >90)
Potassium: 3.5 mEq/L (ref 3.5–5.1)
Sodium: 136 mEq/L (ref 135–145)

## 2011-09-18 LAB — PROTIME-INR
INR: 1.41 (ref 0.00–1.49)
Prothrombin Time: 17.5 seconds — ABNORMAL HIGH (ref 11.6–15.2)

## 2011-09-18 MED ORDER — ZOLPIDEM TARTRATE 5 MG PO TABS
5.0000 mg | ORAL_TABLET | Freq: Every evening | ORAL | Status: DC | PRN
  Administered 2011-09-18 – 2011-09-20 (×2): 5 mg via ORAL
  Filled 2011-09-18 (×2): qty 1

## 2011-09-18 MED ORDER — LIP MEDEX EX OINT
TOPICAL_OINTMENT | CUTANEOUS | Status: DC | PRN
  Filled 2011-09-18: qty 7

## 2011-09-18 NOTE — Progress Notes (Signed)
Subjective: No new issues. Ambulant.  Objective: Vital signs in last 24 hours: Temp:  [97.9 F (36.6 C)-99.3 F (37.4 C)] 97.9 F (36.6 C) (06/25 0510) Pulse Rate:  [73-78] 73  (06/25 0510) Resp:  [18-20] 20  (06/25 0510) BP: (138-165)/(64-73) 165/73 mmHg (06/25 0510) SpO2:  [94 %-96 %] 94 % (06/25 0826) Weight:  [76.1 kg (167 lb 12.3 oz)] 76.1 kg (167 lb 12.3 oz) (06/25 0509) Weight change:  Last BM Date: 09/18/11  Intake/Output from previous day: 06/24 0701 - 06/25 0700 In: 900 [I.V.:600; IV Piggyback:300] Out: 1650 [Urine:1650] Total I/O In: 1500 [I.V.:1500] Out: 300 [Urine:300]   Physical Exam: General: Comfortable, alert, communicative, fully oriented, not short of breath at rest.  HEENT:  Moderate clinical pallor, no jaundice, no conjunctival injection or discharge. Hydration status is satisfactory. NECK:  Supple, JVP not seen, no palpable lymphadenopathy, no palpable goiter. CHEST:  Clinically clear to auscultation, no wheezes, no crackles. HEART:  Sounds 1 and 2 heard, normal, regular, no murmurs. ABDOMEN:  Full, soft, non-tender, no palpable organomegaly, no palpable masses, normal bowel sounds. GENITALIA:  Not examined. LOWER EXTREMITIES:  No pitting edema, palpable peripheral pulses. MUSCULOSKELETAL SYSTEM:  Generalized osteoarthritic changes, otherwise, normal. CENTRAL NERVOUS SYSTEM:  No focal neurologic deficit on gross examination.  Lab Results:  Basename 09/18/11 0345 09/17/11 0345  WBC 10.0 11.4*  HGB 8.3* 8.1*  HCT 27.8* 27.0*  PLT 221 196    Basename 09/18/11 0345 09/17/11 0345  NA 136 136  K 3.5 3.7  CL 103 104  CO2 22 24  GLUCOSE 91 101*  BUN 9 11  CREATININE 0.85 1.01  CALCIUM 8.3* 8.5   Recent Results (from the past 240 hour(s))  URINE CULTURE     Status: Normal   Collection Time   09/15/11  8:35 PM      Component Value Range Status Comment   Specimen Description URINE, RANDOM   Final    Special Requests NONE   Final    Culture   Setup Time [id number]   Final    Colony Count 9,000 COLONIES/ML   Final    Culture INSIGNIFICANT GROWTH   Final    Report Status 09/16/2011 FINAL   Final   CULTURE, BLOOD (ROUTINE X 2)     Status: Normal (Preliminary result)   Collection Time   09/15/11  9:14 PM      Component Value Range Status Comment   Specimen Description BLOOD L ARM   Final    Special Requests BOTTLES DRAWN AEROBIC AND ANAEROBIC   Final    Culture  Setup Time [id number]   Final    Culture     Final    Value:        BLOOD CULTURE RECEIVED NO GROWTH TO DATE CULTURE WILL BE HELD FOR 5 DAYS BEFORE ISSUING A FINAL NEGATIVE REPORT   Report Status PENDING   Incomplete   CULTURE, BLOOD (ROUTINE X 2)     Status: Normal (Preliminary result)   Collection Time   09/15/11  9:29 PM      Component Value Range Status Comment   Specimen Description BLOOD R ARM   Final    Special Requests BOTTLES DRAWN AEROBIC AND ANAEROBIC   Final    Culture  Setup Time [id number]   Final    Culture     Final    Value:        BLOOD CULTURE RECEIVED NO GROWTH TO DATE CULTURE WILL BE HELD  FOR 5 DAYS BEFORE ISSUING A FINAL NEGATIVE REPORT   Report Status PENDING   Incomplete      Studies/Results: No results found.  Medications: Scheduled Meds:    . cholecalciferol  2,000 Units Oral Daily  . ciprofloxacin  400 mg Intravenous Q12H  . levothyroxine  44 mcg Intravenous Daily  . metronidazole  500 mg Intravenous Q8H  . mirtazapine  15 mg Oral QHS  . nisoldipine  17 mg Oral Daily  . olopatadine  1 drop Both Eyes BID  . pantoprazole (PROTONIX) IV  40 mg Intravenous Q24H  . tiotropium  18 mcg Inhalation Daily   Continuous Infusions:    . sodium chloride 75 mL/hr at 09/17/11 2220   PRN Meds:.acyclovir ointment, albuterol, bisacodyl, HYDROcodone-acetaminophen, hydrocortisone-pramoxine, morphine injection, polyvinyl alcohol, zolpidem  Assessment/Plan:  Active Problems:  1. Acute Diverticulitis:  Patient presented with  abdominal pain, fevers, chills and leukocytosis. Abdominal /Pelvic CT demonstrated stranding around the sigmoid colon compatible with acute diverticulitis. There was extraluminal gas adjacent to the sigmoid colon compatible with contained perforation. Patient is being managed with bowel rest, ivi fluids, analgesics, and iv Ciprofloxacin/Flagyl, now day# 1. Have discussed with Dr Abbey Chatters, surgeon, and conservative management appears indicated for now. Dr Ezzard Standing saw patient subsequently, and has recommended watchful waiting and conservative treatment. Wcc has trended down to 10.0 today, no pyrexia has been recorded. Clearly, patient is improving clinically. 2. Query UTI:  This is not substantiated by urinalysis findings. Although patient did have urinary frequency/urgency at presentation, this is likely secondary to bladder irritability, from adjacent inflammed bowel.  3. Hyponatremia: Mild, and already resolved with iv NS. Following lytes.  4. Patient has a normocytic anemia, and HB is drifting down gradually, and clinically, she has no evidence of acute bleed. Will continue to follow CBC. Typed and screened PRBC.  5. Chronic kidney disease: This is stage II, based on GFR. Creatinine is stable and within normal limits. 6: Hypothyroidism: Continued on thyroxine replacement therapy. 7. Chronic anticoagulation: Patient is on chronic anticoagulation with warfarin, for carotid artery occlusive disease. INR was therapeutic at 2.27 on admission. Warfarin is for now, in case patient needs invasive surgical or IR procedure, in the next few days. I did discuss with surgeon, and the recommendation, is to continue to hold, until diet is resumed and tolerated.   Comment: Will defer all questions re diet and possible follow up imaging, to surgical team.        LOS: 3 days   Sky Borboa,CHRISTOPHER 09/18/2011, 3:46 PM

## 2011-09-18 NOTE — Progress Notes (Signed)
Physical Therapy Treatment Patient Details Name: [name] MRN: [id number] DOB: 06-Sep-1919 Today's Date: 09/18/2011 Time: 0850-0908 PT Time Calculation (min): 18 min  PT Assessment / Plan / Recommendation Comments on Treatment Session  Mobilizing fairly well. Pt appears a little more fatigued this session compared to last. May benefit from HHPT at discharge at ALF-as long as facility can assist as needed.     Follow Up Recommendations  Home health PT    Barriers to Discharge        Equipment Recommendations  None recommended by PT    Recommendations for Other Services OT consult  Frequency Min 3X/week   Plan Discharge plan needs to be updated    Precautions / Restrictions Precautions Precautions: None Restrictions Weight Bearing Restrictions: No   Pertinent Vitals/Pain     Mobility  Bed Mobility Bed Mobility: Supine to Sit Supine to Sit: 6: Modified independent (Device/Increase time);With rails;HOB elevated Transfers Transfers: Sit to Stand;Stand to Sit Sit to Stand: 5: Supervision;From toilet;From bed;With upper extremity assist Stand to Sit: 5: Supervision;To toilet;To bed;With upper extremity assist;To chair/3-in-1 Details for Transfer Assistance: x 2. VCs safety, hand placement. Some difficulty lowering to toilet seat due to height. Utilized [name].  Ambulation/Gait Ambulation/Gait Assistance: 4: Min guard Ambulation Distance (Feet): 100 Feet Assistive device: Rolling walker Ambulation/Gait Assistance Details: VCs safety. Dyspnea 3/4 with ambulation-also some audible wheezing. Fatigues fairly easily. Ambulated on RA. Gait Pattern: Step-through pattern;Trunk flexed;Decreased stride length    Exercises     PT Diagnosis:    PT Problem List:   PT Treatment Interventions:     PT Goals Acute Rehab PT Goals PT Goal: Supine/Side to Sit - Progress: Progressing toward goal PT Goal: Sit to Stand - Progress: Progressing toward goal PT Goal: Ambulate -  Progress: Progressing toward goal  Visit Information  Last PT Received On: 09/18/11 Assistance Needed: +1    Subjective Data  Subjective: "Are we gonna walk today?" Patient Stated Goal: Home   Cognition  Overall Cognitive Status: Appears within functional limits for tasks assessed/performed Arousal/Alertness: Awake/Newman Orientation Level: Appears intact for tasks assessed Behavior During Session: [name] for tasks performed    Balance     End of Session PT - End of Session Activity Tolerance: Patient limited by fatigue Patient left: in chair;with call bell/phone within reach    [name] [facility] 09/18/2011, 9:50 AM [phone]

## 2011-09-18 NOTE — Progress Notes (Signed)
  Subjective: Sore from being in bed.  Abdomen feels better.  Objective: Vital signs in last 24 hours: Temp:  [97.9 F (36.6 C)-99.3 F (37.4 C)] 97.9 F (36.6 C) (06/25 0510) Pulse Rate:  [72-78] 73  (06/25 0510) Resp:  [18-20] 20  (06/25 0510) BP: (138-165)/(63-73) 165/73 mmHg (06/25 0510) SpO2:  [94 %-97 %] 94 % (06/25 0826) Weight:  [76.1 kg (167 lb 12.3 oz)] 76.1 kg (167 lb 12.3 oz) (06/25 0509) Last BM Date: 09/16/11  3 stools recorded, Tm 99.3, VSS, labs OK, WBC still trending down, started on abx 09/15/11.  Intake/Output from previous day: 06/24 0701 - 06/25 0700 In: 900 [I.V.:600; IV Piggyback:300] Out: 1650 [Urine:1650] Intake/Output this shift:    General appearance: alert, cooperative, no distress and shoulders sore, from bed.  On O2 for her COPD Resp: few rales in bases, decreased BS at the base.   GI: soft, non-tender; bowel sounds normal; no masses,  no organomegaly  Lab Results:   Basename 09/18/11 0345 09/17/11 0345  WBC 10.0 11.4*  HGB 8.3* 8.1*  HCT 27.8* 27.0*  PLT 221 196    BMET  Basename 09/18/11 0345 09/17/11 0345  NA 136 136  K 3.5 3.7  CL 103 104  CO2 22 24  GLUCOSE 91 101*  BUN 9 11  CREATININE 0.85 1.01  CALCIUM 8.3* 8.5   PT/INR  Basename 09/18/11 0345 09/17/11 0345  LABPROT 17.5* 20.1*  INR 1.41 1.68*     Lab 09/17/11 0345 09/16/11 0615 09/15/11 2114  AST 25 27 32  ALT 13 14 17   ALKPHOS 65 71 82  BILITOT 0.4 0.6 0.5  PROT 6.5 6.8 7.6  ALBUMIN 2.7* 2.9* 3.4*     Lipase  No results found for this basename: lipase     Studies/Results: No results found.  Medications:    . cholecalciferol  2,000 Units Oral Daily  . ciprofloxacin  400 mg Intravenous Q12H  . levothyroxine  44 mcg Intravenous Daily  . metronidazole  500 mg Intravenous Q8H  . mirtazapine  15 mg Oral QHS  . nisoldipine  17 mg Oral Daily  . olopatadine  1 drop Both Eyes BID  . pantoprazole (PROTONIX) IV  40 mg Intravenous Q24H  . tiotropium  18  mcg Inhalation Daily  . DISCONTD: polyethylene glycol  17 g Oral Daily    Assessment/Plan Diverticulitis, sigmoid colon with contained perforation.  Antibiotics started 6/22. CC: weakness/abdominal pain  UTI - urine culture 9000 colonies COPD on home O2 at night. Chronic renal disease  Anemia  Anticoagulated INR 1.68 (carotid artery occlusion)  Changes in liver ? CIRRHOSIS  Gout  Hypertension  Dyslipidemia  Hiatal Hernia   Plan:  OOB, ambulate, IS, discuss when to start PO's and repeat CT  LOS: 3 days    [name] 09/18/2011

## 2011-09-19 DIAGNOSIS — E782 Mixed hyperlipidemia: Secondary | ICD-10-CM

## 2011-09-19 DIAGNOSIS — K5732 Diverticulitis of large intestine without perforation or abscess without bleeding: Secondary | ICD-10-CM

## 2011-09-19 DIAGNOSIS — Z7901 Long term (current) use of anticoagulants: Secondary | ICD-10-CM

## 2011-09-19 DIAGNOSIS — I10 Essential (primary) hypertension: Secondary | ICD-10-CM

## 2011-09-19 LAB — GLUCOSE, CAPILLARY: Glucose-Capillary: 95 mg/dL (ref 70–99)

## 2011-09-19 MED ORDER — ALBUTEROL SULFATE (5 MG/ML) 0.5% IN NEBU
2.5000 mg | INHALATION_SOLUTION | Freq: Four times a day (QID) | RESPIRATORY_TRACT | Status: DC
  Administered 2011-09-20 (×3): 2.5 mg via RESPIRATORY_TRACT
  Filled 2011-09-19 (×4): qty 0.5

## 2011-09-19 NOTE — Clinical Documentation Improvement (Signed)
RESPIRATORY FAILURE DOCUMENTATION CLARIFICATION QUERY   THIS DOCUMENT IS NOT A PERMANENT PART OF THE MEDICAL RECORD  TO RESPOND TO THE THIS QUERY, FOLLOW THE INSTRUCTIONS BELOW:  1. If needed, update documentation for the patient's encounter via the notes activity.  2. Access this query again and click edit on the In [employer].  3. After updating, or not, click F2 to complete all highlighted (required) fields concerning your review. Select "additional documentation in the medical record" OR "no additional documentation provided".  4. Click Sign note button.  5. The deficiency will fall out of your In Basket *Please let us know if you are not able to complete this workflow by phone or e-mail (listed below).  Please update your documentation within the medical record to reflect your response to this query.                                                                                    09/19/11  Dear Dr. Cena Benton Marton Redwood,  In a better effort to capture your patient's severity of illness, reflect appropriate length of stay and utilization of resources, a review of the patient medical record has revealed the following indicators.    Based on your clinical judgment, please clarify and document in a progress note and/or discharge summary the clinical condition associated with the following supporting information:  In responding to this query please exercise your independent judgment.  The fact that a query is asked, does not imply that any particular answer is desired or expected. Pt admitted with acute diverticulitis.  Accoding to progress notes pt has COPD and is on home O2 at night. Please clarify on progress notes/ d/c summary if pt  COPD can be further specified with any underlying condition(s) listed below. Thank you. Possible Clinical Conditions?  Acute Respiratory Failure  Acute on Chronic Respiratory Failure  Chronic Respiratory Failure  _______Other  Condition________________ _______Cannot Clinically Determine    Supporting Information: Risk Factors:COPD, HTN, CKD  Signs&Symptoms: per PT eval on 09/17/11 :"hx of COPD and wears O2 at night"                               per PT  on 09/19/11 "Outcome: Progressing O2 sats: 88% on RA at rest, 83% on RA during ambulation with PT. [employer] O2 at end of session"                                per  CCS pn  09/19/11:  "COPD on home O2 at night "  Radiology:09/15/11 CHEST:  IMPRESSION:  1.  Minimal bibasilar opacities likely reflect atelectasis; lungs otherwise clear  Treatment:   O2/2L/min with  O2 sats 92%-94 %  Respiratory Treatment: Proventil  neb,  Spiriva inhal, Flutter valve, IS,   You may use possible, probable, or suspect with inpatient documentation. possible, probable, suspected diagnoses MUST be documented at the time of discharge  Reviewed: additional documentation in the medical record  Thank You,  Andy Gauss RN  Clinical Documentation Specialist:  Pager [phone] E-mail  [email or web address] Health Information Management [place]  Please refer to d/c summary.  Patient has chronic respiratory failure.

## 2011-09-19 NOTE — Plan of Care (Signed)
Problem: Phase III Progression Outcomes Goal: Activity at appropriate level-compared to baseline (UP IN CHAIR FOR HEMODIALYSIS)  Outcome: Progressing O2 sats: 88% on RA at rest, 83% on RA during ambulation with PT. [employer] [place] O2 at end of session

## 2011-09-19 NOTE — Progress Notes (Addendum)
Subjective:  No acute issues reported overnight.  Patient denies any abdominal pain.  Has been having BM's and frequency.  Denies any dysuria.  Denies any fever, chills, nausea, emesis, abdominal distension.  Objective: Filed Vitals:   09/19/11 0500 09/19/11 0548 09/19/11 0549 09/19/11 1400  BP:  181/74  159/79  Pulse:  64  88  Temp:  98.3 F (36.8 C)  98.6 F (37 C)  TempSrc:  Oral  Oral  Resp:  18  18  Height:      Weight: 76.2 kg (167 lb 15.9 oz)  76.2 kg (167 lb 15.9 oz)   SpO2:  94%  94%   Weight change: 0.1 kg (3.5 oz)  Intake/Output Summary (Last 24 hours) at 09/19/11 1729 Last data filed at 09/19/11 1400  Gross per 24 hour  Intake 2156.67 ml  Output   1725 ml  Net 431.67 ml    General: Alert, awake, oriented x3, in no acute distress.  HEENT: No bruits, no goiter.  Heart: Regular rate and rhythm, without murmurs, rubs, gallops.  Lungs: Patient has expiratory wheezes BL, bilateral air movement.  Abdomen: Soft, nontender, nondistended, positive bowel sounds.  Neuro: Grossly intact, nonfocal.  Lab Results:  [facility] 09/18/11 0345 09/17/11 0345  NA 136 136  K 3.5 3.7  CL 103 104  CO2 22 24  GLUCOSE 91 101*  BUN 9 11  CREATININE 0.85 1.01  CALCIUM 8.3* 8.5  MG -- --  PHOS -- --    Basename 09/17/11 0345  AST 25  ALT 13  ALKPHOS 65  BILITOT 0.4  PROT 6.5  ALBUMIN 2.7*   No results found for this basename: LIPASE:2,AMYLASE:2 in the last 72 hours  Basename 09/18/11 0345 09/17/11 0345  WBC 10.0 11.4*  NEUTROABS -- --  HGB 8.3* 8.1*  HCT 27.8* 27.0*  MCV 80.1 79.4  PLT 221 196   No results found for this basename: CKTOTAL:3,CKMB:3,CKMBINDEX:3,TROPONINI:3 in the last 72 hours No components found with this basename: POCBNP:3 No results found for this basename: DDIMER:2 in the last 72 hours No results found for this basename: HGBA1C:2 in the last 72 hours No results found for this basename: CHOL:2,HDL:2,LDLCALC:2,TRIG:2,CHOLHDL:2,LDLDIRECT:2 in the  last 72 hours No results found for this basename: TSH,T4TOTAL,FREET3,T3FREE,THYROIDAB in the last 72 hours  Basename 09/17/11 0345  VITAMINB12 575  FOLATE --  FERRITIN --  TIBC --  IRON --  RETICCTPCT --    Micro Results: Recent Results (from the past 240 hour(s))  URINE CULTURE     Status: Normal   Collection Time   09/15/11  8:35 PM      Component Value Range Status Comment   Specimen Description URINE, RANDOM   Final    Special Requests NONE   Final    Culture  Setup Time [id number]   Final    Colony Count 9,000 COLONIES/ML   Final    Culture INSIGNIFICANT GROWTH   Final    Report Status 09/16/2011 FINAL   Final   CULTURE, BLOOD (ROUTINE X 2)     Status: Normal (Preliminary result)   Collection Time   09/15/11  9:14 PM      Component Value Range Status Comment   Specimen Description BLOOD L ARM   Final    Special Requests BOTTLES DRAWN AEROBIC AND ANAEROBIC   Final    Culture  Setup Time [id number]   Final    Culture     Final    Value:  BLOOD CULTURE RECEIVED NO GROWTH TO DATE CULTURE WILL BE HELD FOR 5 DAYS BEFORE ISSUING A FINAL NEGATIVE REPORT   Report Status PENDING   Incomplete   CULTURE, BLOOD (ROUTINE X 2)     Status: Normal (Preliminary result)   Collection Time   09/15/11  9:29 PM      Component Value Range Status Comment   Specimen Description BLOOD R ARM   Final    Special Requests BOTTLES DRAWN AEROBIC AND ANAEROBIC   Final    Culture  Setup Time [id number]   Final    Culture     Final    Value:        BLOOD CULTURE RECEIVED NO GROWTH TO DATE CULTURE WILL BE HELD FOR 5 DAYS BEFORE ISSUING A FINAL NEGATIVE REPORT   Report Status PENDING   Incomplete     Studies/Results: No results found.  Medications: I have reviewed the patient's current medications.   Patient Active Hospital Problem List: Diverticulitis (09/16/2011) - Patient presented with abdominal pain, fevers, chills and leukocytosis. Abdominal /Pelvic CT demonstrated  stranding around the sigmoid colon compatible with acute diverticulitis. There was extraluminal gas adjacent to the sigmoid colon compatible with contained perforation. Patient is being managed with bowel rest, ivi fluids, analgesics, and iv Ciprofloxacin/Flagyl, now day# 2. Have discussed with Dr Abbey Chatters, surgeon, and conservative management appears indicated for now. Dr Ezzard Standing saw patient subsequently, and has recommended watchful waiting and conservative treatment. Wcc has trended down to 10.0 today, no fevers have been recorded.   Clearly, patient is improving clinically.  Leukocytosis -Resolved on IV abx's as indicated above.  Most likely secondary to # 1  Anemia - No active bleeding currently based. - continue to monitor cbc's  Chronic Kidney disease - Stable continue to monitor creatinine levels  Hypothyroidism - Stable patient is on synthroid will continue  COPD - Patient is on oxygen at home - Has audible wheezes today will plan on scheduling albuterol q six hours - albuterol q 2 hours - Continue Spiriva  Addendum:  Chronic Anticoagulation - On Warfarin due to history of Carotid artery occlusive disease.  At this point Warfarin is being held in case operation is required.  Once diet is resumed and tolerated will plan on continuing as per prior discussion with General surgery     LOS: 4 days   Penny Pia M.D.  [facility] 09/19/2011, 5:29 PM

## 2011-09-19 NOTE — Progress Notes (Signed)
  Subjective: Pt up going to bathroom, didn't take much PO yesterday, she said broth was to salty. Not having pain now.  Objective: Vital signs in last 24 hours: Temp:  [98.3 F (36.8 C)-99.2 F (37.3 C)] 98.3 F (36.8 C) (06/26 0548) Pulse Rate:  [64-73] 64  (06/26 0548) Resp:  [18-20] 18  (06/26 0548) BP: (147-181)/(61-74) 181/74 mmHg (06/26 0548) SpO2:  [91 %-94 %] 94 % (06/26 0548) Weight:  [76.2 kg (167 lb 15.9 oz)] 76.2 kg (167 lb 15.9 oz) (06/26 0549) Last BM Date: 09/18/11  4 stools recorded yesterday,  Soft stools, afebrile, VSS, no labs today  Intake/Output from previous day: 06/25 0701 - 06/26 0700 In: 3187.5 [P.O.:120; I.V.:3067.5] Out: 1150 [Urine:1150] Intake/Output this shift: Total I/O In: -  Out: 100 [Urine:100]  General appearance: alert, cooperative, no distress and wheezing after being up.   Resp: Bilateral wheezing, on O2, says this is her normal after moving around. GI: soft, non-tender; bowel sounds normal; no masses,  no organomegaly  Lab Results:   Basename 09/18/11 0345 09/17/11 0345  WBC 10.0 11.4*  HGB 8.3* 8.1*  HCT 27.8* 27.0*  PLT 221 196    BMET  Basename 09/18/11 0345 09/17/11 0345  NA 136 136  K 3.5 3.7  CL 103 104  CO2 22 24  GLUCOSE 91 101*  BUN 9 11  CREATININE 0.85 1.01  CALCIUM 8.3* 8.5   PT/INR  Basename 09/19/11 0348 09/18/11 0345  LABPROT 17.1* 17.5*  INR 1.37 1.41     Lab 09/17/11 0345 09/16/11 0615 09/15/11 2114  AST 25 27 32  ALT 13 14 17   ALKPHOS 65 71 82  BILITOT 0.4 0.6 0.5  PROT 6.5 6.8 7.6  ALBUMIN 2.7* 2.9* 3.4*     Lipase  No results found for this basename: lipase     Studies/Results: No results found.  Medications:    . cholecalciferol  2,000 Units Oral Daily  . ciprofloxacin  400 mg Intravenous Q12H  . levothyroxine  44 mcg Intravenous Daily  . metronidazole  500 mg Intravenous Q8H  . mirtazapine  15 mg Oral QHS  . nisoldipine  17 mg Oral Daily  . olopatadine  1 drop Both Eyes  BID  . pantoprazole (PROTONIX) IV  40 mg Intravenous Q24H  . tiotropium  18 mcg Inhalation Daily    Assessment/Plan Diverticulitis, sigmoid colon with contained perforation. Antibiotics started 6/22.  CC: weakness/abdominal pain  UTI - urine culture 9000 colonies  COPD on home O2 at night.  Chronic renal disease  Anemia  Anticoagulated INR 1.68 (carotid artery occlusion)  Changes in liver ? CIRRHOSIS  Gout  Hypertension  Dyslipidemia  Hiatal Hernia   Plan:  Go to full liquids, recheck labs in AM discuss changing to oral antibiotics tomorrow. On antibiotics since PM 09/15/11.    LOS: 4 days    Burdette Gergely 09/19/2011

## 2011-09-19 NOTE — Progress Notes (Signed)
Physical Therapy Treatment Patient Details Name: [name] MRN: [id number] DOB: [birth year] Today's Date: 09/19/2011 Time: 9629-5284 PT Time Calculation (min): 23 min  PT Assessment / Plan / Recommendation Comments on Treatment Session  Contiuing to mobilize well. Able to increase ambulation distance this session. Noted O2 sats to drop while ambulating on RA this session. Recommend HHPT at ALF as long as facility can assist pt as needed.     Follow Up Recommendations  Home health PT    Barriers to Discharge        Equipment Recommendations  None recommended by PT    Recommendations for Other Services OT consult  Frequency Min 3X/week   Plan Discharge plan remains appropriate    Precautions / Restrictions Precautions Precautions: None Restrictions Weight Bearing Restrictions: No   Pertinent Vitals/Pain O2 sats: 88% on RA at rest, 83% on RA during ambulation. [employer] [place] O2 at end of session.     Mobility  Bed Mobility Bed Mobility: Sit to Supine Sit to Supine: 6: Modified independent (Device/Increase time) Transfers Transfers: Sit to Stand;Stand to Sit Sit to Stand: 5: Supervision;With upper extremity assist;From toilet;From chair/3-in-1 Stand to Sit: 5: Supervision;With upper extremity assist;To toilet;To bed Details for Transfer Assistance: VCs safety, hand placement. Pt tends to leave walker behind once close to sitting surfaces. Ambulation/Gait Ambulation/Gait Assistance: 4: Min guard Ambulation Distance (Feet): 150 Feet Assistive device: Rolling walker Ambulation/Gait Assistance Details: VCs safety, pursed lip breathing. O2 sats 83% on RA during ambulation. Dyspnea 3/4. Pt fatigued towards end of walk. Gait Pattern: Step-through pattern    Exercises     PT Diagnosis:    PT Problem List:   PT Treatment Interventions:     PT Goals Acute Rehab PT Goals PT Goal: Sit to Supine/Side - Progress: Progressing toward goal PT Goal: Sit to Stand - Progress:  Progressing toward goal PT Goal: Ambulate - Progress: Progressing toward goal  Visit Information  Last PT Received On: 09/19/11 Assistance Needed: +1    Subjective Data  Subjective: "I slept las night" Patient Stated Goal: Home   Cognition  Overall Cognitive Status: Appears within functional limits for tasks assessed/performed Arousal/Alertness: Awake/alert Orientation Level: Appears intact for tasks assessed Behavior During Session: [facility] for tasks performed    Balance     End of Session PT - End of Session Equipment Utilized During Treatment: Gait belt Activity Tolerance: Patient limited by fatigue Patient left: in bed;with call bell/phone within reach   GP     [name] [facility] 09/19/2011, 11:19 AM [phone]

## 2011-09-20 ENCOUNTER — Inpatient Hospital Stay (HOSPITAL_COMMUNITY): Payer: Medicare Other

## 2011-09-20 DIAGNOSIS — I10 Essential (primary) hypertension: Secondary | ICD-10-CM

## 2011-09-20 DIAGNOSIS — K5732 Diverticulitis of large intestine without perforation or abscess without bleeding: Secondary | ICD-10-CM

## 2011-09-20 DIAGNOSIS — Z7901 Long term (current) use of anticoagulants: Secondary | ICD-10-CM

## 2011-09-20 DIAGNOSIS — E782 Mixed hyperlipidemia: Secondary | ICD-10-CM

## 2011-09-20 LAB — COMPREHENSIVE METABOLIC PANEL
ALT: 10 U/L (ref 0–35)
AST: 26 U/L (ref 0–37)
CO2: 22 mEq/L (ref 19–32)
Chloride: 104 mEq/L (ref 96–112)
Creatinine, Ser: 0.8 mg/dL (ref 0.50–1.10)
GFR calc non Af Amer: 63 mL/min — ABNORMAL LOW (ref >90)
Sodium: 138 mEq/L (ref 135–145)
Total Bilirubin: 0.3 mg/dL (ref 0.3–1.2)

## 2011-09-20 LAB — CBC
Hemoglobin: 8.8 g/dL — ABNORMAL LOW (ref 12.0–15.0)
MCV: 79 fL (ref 78.0–100.0)
Platelets: 283 10*3/uL (ref 150–400)
RBC: 3.62 MIL/uL — ABNORMAL LOW (ref 3.87–5.11)
WBC: 7.9 10*3/uL (ref 4.0–10.5)

## 2011-09-20 MED ORDER — POTASSIUM CHLORIDE CRYS ER 20 MEQ PO TBCR
30.0000 meq | EXTENDED_RELEASE_TABLET | Freq: Three times a day (TID) | ORAL | Status: DC
  Administered 2011-09-20 – 2011-09-21 (×2): 30 meq via ORAL
  Filled 2011-09-20 (×6): qty 1

## 2011-09-20 MED ORDER — CIPROFLOXACIN HCL 500 MG PO TABS
500.0000 mg | ORAL_TABLET | Freq: Two times a day (BID) | ORAL | Status: DC
  Administered 2011-09-20 – 2011-09-21 (×3): 500 mg via ORAL
  Filled 2011-09-20 (×5): qty 1

## 2011-09-20 MED ORDER — LEVOTHYROXINE SODIUM 88 MCG PO TABS
88.0000 ug | ORAL_TABLET | Freq: Every day | ORAL | Status: DC
  Administered 2011-09-21: 88 ug via ORAL
  Filled 2011-09-20 (×2): qty 1

## 2011-09-20 MED ORDER — MAGNESIUM OXIDE 400 (241.3 MG) MG PO TABS
400.0000 mg | ORAL_TABLET | Freq: Every day | ORAL | Status: DC
  Administered 2011-09-20 – 2011-09-21 (×2): 400 mg via ORAL
  Filled 2011-09-20 (×2): qty 1

## 2011-09-20 MED ORDER — METRONIDAZOLE 500 MG PO TABS
500.0000 mg | ORAL_TABLET | Freq: Three times a day (TID) | ORAL | Status: DC
  Administered 2011-09-20 – 2011-09-21 (×3): 500 mg via ORAL
  Filled 2011-09-20 (×6): qty 1

## 2011-09-20 MED ORDER — PANTOPRAZOLE SODIUM 40 MG PO TBEC
40.0000 mg | DELAYED_RELEASE_TABLET | Freq: Every day | ORAL | Status: DC
  Administered 2011-09-20: 40 mg via ORAL
  Filled 2011-09-20 (×2): qty 1

## 2011-09-20 NOTE — Care Management Note (Signed)
    Page 1 of 1   09/21/2011     4:36:13 PM   CARE MANAGEMENT NOTE 09/21/2011  Patient:  [name], [name]   Account Number:  [id number]  Date Initiated:  09/20/2011  Documentation initiated by:  Lanier Clam  Subjective/Objective Assessment:   ADMITTED W/[email or web address].[email or web address]:[email or web address] BLIND     Action/Plan:   FROM [name]   Anticipated DC Date:  09/21/2011   Anticipated DC Plan:  ASSISTED LIVING / REST HOME  In-house referral  Clinical Social Worker      DC Planning Services  CM consult      Choice offered to / List presented to:             Status of service:  Completed, signed off Medicare Important Message given?   (If response is "NO", the following Medicare IM given date fields will be blank) Date Medicare IM given:   Date Additional Medicare IM given:    Discharge Disposition:  ASSISTED LIVING  Per UR Regulation:  Reviewed for med. necessity/level of care/duration of stay  If discussed at Long Length of Stay Meetings, dates discussed:    Comments:  09/20/11 [address] [facility] RN,BSN NCM [phone]

## 2011-09-20 NOTE — Progress Notes (Signed)
Subjective: Patient feels like she is breathing better today especially after the breathing treatments.  I asked nurse to recheck blood pressure and found it to be ~135/55.  Patient mentions that she was having some back discomfort earlier when they took her blood pressure.  She denies any headaches, fever, chills, nausea.  Objective: Filed Vitals:   09/19/11 2212 09/20/11 0606 09/20/11 0621 09/20/11 0846  BP: 168/74 190/77    Pulse: 77 75    Temp: 98.6 F (37 C) 97.9 F (36.6 C)    TempSrc: Oral Oral    Resp: 20 20    Height:      Weight:  75.4 kg (166 lb 3.6 oz)    SpO2: 93% 96% 97% 92%   Weight change: -0.8 kg (-1 lb 12.2 oz)  Intake/Output Summary (Last 24 hours) at 09/20/11 1524 Last data filed at 09/20/11 1400  Gross per 24 hour  Intake   1640 ml  Output    500 ml  Net   1140 ml    General: Alert, awake, oriented x3, in no acute distress.  HEENT: No bruits, no goiter.  Heart: Regular rate and rhythm, without murmurs, rubs, gallops.  Lungs: Clear to auscultation, bilateral air movement.  Abdomen: Soft, nontender, nondistended, positive bowel sounds.  Neuro: Grossly intact, nonfocal.   Lab Results:  Basename 09/20/11 0410 09/18/11 0345  NA 138 136  K 2.8* 3.5  CL 104 103  CO2 22 22  GLUCOSE 93 91  BUN 6 9  CREATININE 0.80 0.85  CALCIUM 8.5 8.3*  MG 1.6 --  PHOS -- --    Basename 09/20/11 0410  AST 26  ALT 10  ALKPHOS 56  BILITOT 0.3  PROT 6.6  ALBUMIN 2.7*   No results found for this basename: LIPASE:2,AMYLASE:2 in the last 72 hours  Basename 09/20/11 0410 09/18/11 0345  WBC 7.9 10.0  NEUTROABS -- --  HGB 8.8* 8.3*  HCT 28.6* 27.8*  MCV 79.0 80.1  PLT 283 221   No results found for this basename: CKTOTAL:3,CKMB:3,CKMBINDEX:3,TROPONINI:3 in the last 72 hours No components found with this basename: POCBNP:3 No results found for this basename: DDIMER:2 in the last 72 hours No results found for this basename: HGBA1C:2 in the last 72 hours No  results found for this basename: CHOL:2,HDL:2,LDLCALC:2,TRIG:2,CHOLHDL:2,LDLDIRECT:2 in the last 72 hours No results found for this basename: TSH,T4TOTAL,FREET3,T3FREE,THYROIDAB in the last 72 hours No results found for this basename: VITAMINB12:2,FOLATE:2,FERRITIN:2,TIBC:2,IRON:2,RETICCTPCT:2 in the last 72 hours  Micro Results: Recent Results (from the past 240 hour(s))  URINE CULTURE     Status: Normal   Collection Time   09/15/11  8:35 PM      Component Value Range Status Comment   Specimen Description URINE, RANDOM   Final    Special Requests NONE   Final    Culture  Setup Time [id number]   Final    Colony Count 9,000 COLONIES/ML   Final    Culture INSIGNIFICANT GROWTH   Final    Report Status 09/16/2011 FINAL   Final   CULTURE, BLOOD (ROUTINE X 2)     Status: Normal (Preliminary result)   Collection Time   09/15/11  9:14 PM      Component Value Range Status Comment   Specimen Description BLOOD L ARM   Final    Special Requests BOTTLES DRAWN AEROBIC AND ANAEROBIC   Final    Culture  Setup Time [id number]   Final    Culture     Final  Value:        BLOOD CULTURE RECEIVED NO GROWTH TO DATE CULTURE WILL BE HELD FOR 5 DAYS BEFORE ISSUING A FINAL NEGATIVE REPORT   Report Status PENDING   Incomplete   CULTURE, BLOOD (ROUTINE X 2)     Status: Normal (Preliminary result)   Collection Time   09/15/11  9:29 PM      Component Value Range Status Comment   Specimen Description BLOOD R ARM   Final    Special Requests BOTTLES DRAWN AEROBIC AND ANAEROBIC   Final    Culture  Setup Time [id number]   Final    Culture     Final    Value:        BLOOD CULTURE RECEIVED NO GROWTH TO DATE CULTURE WILL BE HELD FOR 5 DAYS BEFORE ISSUING A FINAL NEGATIVE REPORT   Report Status PENDING   Incomplete     Studies/Results: Dg Chest 2 View  09/20/2011  *RADIOLOGY REPORT*  Clinical Data: Wheezing.  Shortness of breath, right chest pain.  CHEST - 2 VIEW  Comparison: 09/15/2011  Findings: Heart  is upper limits normal size.  Diffuse chronic interstitial prominence, likely chronic interstitial lung disease. Bibasilar atelectasis or scarring.  No visible effusions.  No acute bony abnormality.  IMPRESSION: Chronic interstitial lung disease.  Bibasilar scarring or atelectasis.  Original Report Authenticated By: Cyndie Chime, M.D.    Medications: I have reviewed the patient's current medications.   Patient Active Hospital Problem List:  Diverticulitis (09/16/2011) - Patient presented with abdominal pain, fevers, chills and leukocytosis. Abdominal /Pelvic CT demonstrated stranding around the sigmoid colon compatible with acute diverticulitis. There was extraluminal gas adjacent to the sigmoid colon compatible with contained perforation.   Patient is improving clinically with Conservative management.    Very much appreciate surgery's input in this case. And patient has been switched to oral antibiotics today.  Leukocytosis  -Resolved on IV abx's initially. Currently patient is afebrile and will be switched to oral abx regimen.  Anemia  - No active bleeding currently based.  - continue to monitor cbc's   Chronic Kidney disease  - Stable continue to monitor creatinine levels   Hypothyroidism  - Stable patient is on synthroid will continue   COPD  - Patient is on oxygen at home  - Has audible wheezes today will plan on scheduling albuterol q six hours  - albuterol q 2 hours  - Continue Spiriva   Chronic Anticoagulation  - On Warfarin due to history of Carotid artery occlusive disease. At this point Warfarin is being held in case operation is required. Once diet is resumed and tolerated will plan on continuing as per prior discussion with General surgery.  Will plan on continuing most likely tomorrow prior to discharge and have patient follow up with primary care physician for further INR monitoring and dose adjustment in 1 week post d/c.    LOS: 5 days   Penny Pia  M.D.  [facility] 09/20/2011, 3:24 PM

## 2011-09-20 NOTE — Progress Notes (Signed)
  Subjective: Complains of trouble breathing. No abd complaints  Objective: Vital signs in last 24 hours: Temp:  [97.9 F (36.6 C)-98.6 F (37 C)] 97.9 F (36.6 C) (06/27 0606) Pulse Rate:  [75-88] 75  (06/27 0606) Resp:  [18-20] 20  (06/27 0606) BP: (159-190)/(74-79) 190/77 mmHg (06/27 0606) SpO2:  [92 %-97 %] 92 % (06/27 0846) Weight:  [166 lb 3.6 oz (75.4 kg)] 166 lb 3.6 oz (75.4 kg) (06/27 0606) Last BM Date: 09/19/11  Intake/Output from previous day: 06/26 0701 - 06/27 0700 In: 1589.2 [P.O.:360; I.V.:1229.2] Out: 1475 [Urine:1475] Intake/Output this shift:    GI: soft and nontender  Lab Results:   Basename 09/20/11 0410 09/18/11 0345  WBC 7.9 10.0  HGB 8.8* 8.3*  HCT 28.6* 27.8*  PLT 283 221   BMET  Basename 09/20/11 0410 09/18/11 0345  NA 138 136  K 2.8* 3.5  CL 104 103  CO2 22 22  GLUCOSE 93 91  BUN 6 9  CREATININE 0.80 0.85  CALCIUM 8.5 8.3*   PT/INR  Basename 09/20/11 0410 09/19/11 0348  LABPROT 17.1* 17.1*  INR 1.37 1.37   ABG No results found for this basename: PHART:2,PCO2:2,PO2:2,HCO3:2 in the last 72 hours  Studies/Results: Dg Chest 2 View  09/20/2011  *RADIOLOGY REPORT*  Clinical Data: Wheezing.  Shortness of breath, right chest pain.  CHEST - 2 VIEW  Comparison: 09/15/2011  Findings: Heart is upper limits normal size.  Diffuse chronic interstitial prominence, likely chronic interstitial lung disease. Bibasilar atelectasis or scarring.  No visible effusions.  No acute bony abnormality.  IMPRESSION: Chronic interstitial lung disease.  Bibasilar scarring or atelectasis.  Original Report Authenticated By: Cyndie Chime, M.D.    Anti-infectives: Anti-infectives     Start     Dose/Rate Route Frequency Ordered Stop   09/20/11 1400   metroNIDAZOLE (FLAGYL) tablet 500 mg        500 mg Oral 3 times per day 09/20/11 1104     09/20/11 1115   ciprofloxacin (CIPRO) tablet 500 mg        500 mg Oral 2 times daily 09/20/11 1104     09/16/11 0115    ciprofloxacin (CIPRO) IVPB 400 mg  Status:  Discontinued        400 mg 200 mL/hr over 60 Minutes Intravenous Every 12 hours 09/16/11 0113 09/20/11 1104   09/16/11 0115   metroNIDAZOLE (FLAGYL) IVPB 500 mg  Status:  Discontinued        500 mg 100 mL/hr over 60 Minutes Intravenous Every 8 hours 09/16/11 0113 09/20/11 1104   09/16/11 0045   metroNIDAZOLE (FLAGYL) IVPB 500 mg        500 mg 100 mL/hr over 60 Minutes Intravenous  Once 09/16/11 0035 09/16/11 0158   09/16/11 0045   ciprofloxacin (CIPRO) tablet 500 mg        500 mg Oral  Once 09/16/11 0035 09/16/11 0058          Assessment/Plan: s/p * No surgery found * Switch to oral abx Advance to low residue diet Hopefully ready for discharge tomorrow if breathing is better  LOS: 5 days    TOTH III,Eldean Klatt S 09/20/2011

## 2011-09-20 NOTE — Progress Notes (Signed)
PHARMACY NOTE:   IV TO PO CONVERSION  The patient is receiving Levothyroxine and Protonix by the intravenous route.  Based on criteria approved by the Pharmacy and Therapeutics Committee and the Medical Executive Committee, these medications are being converted to the equivalent oral dosage form.  These criteria include: -No active GI bleeding -Able to tolerate diet of full liquids (or better) or tube feeding  -Able to tolerate other medications by the oral or enteral route  If you have any questions about this conversion, please contact the Pharmacy Department (ext [phone]).  Thank you.  Hope Budds, [facility] 09/20/2011 12:34 PM

## 2011-09-21 DIAGNOSIS — K5732 Diverticulitis of large intestine without perforation or abscess without bleeding: Secondary | ICD-10-CM

## 2011-09-21 DIAGNOSIS — Z7901 Long term (current) use of anticoagulants: Secondary | ICD-10-CM

## 2011-09-21 DIAGNOSIS — I10 Essential (primary) hypertension: Secondary | ICD-10-CM

## 2011-09-21 DIAGNOSIS — E782 Mixed hyperlipidemia: Secondary | ICD-10-CM

## 2011-09-21 LAB — BASIC METABOLIC PANEL
BUN: 7 mg/dL (ref 6–23)
Chloride: 106 mEq/L (ref 96–112)
Glucose, Bld: 115 mg/dL — ABNORMAL HIGH (ref 70–99)
Potassium: 3.5 mEq/L (ref 3.5–5.1)

## 2011-09-21 MED ORDER — CIPROFLOXACIN HCL 500 MG PO TABS: 500.0000 mg | ORAL_TABLET | Freq: Two times a day (BID) | ORAL | Status: AC

## 2011-09-21 MED ORDER — SACCHAROMYCES BOULARDII 250 MG PO CAPS: 250.0000 mg | ORAL_CAPSULE | Freq: Two times a day (BID) | ORAL | Status: AC

## 2011-09-21 MED ORDER — METRONIDAZOLE 500 MG PO TABS: 500.0000 mg | ORAL_TABLET | Freq: Three times a day (TID) | ORAL | Status: AC

## 2011-09-21 MED ORDER — SACCHAROMYCES BOULARDII 250 MG PO CAPS
250.0000 mg | ORAL_CAPSULE | Freq: Two times a day (BID) | ORAL | Status: DC
  Filled 2011-09-21 (×2): qty 1

## 2011-09-21 NOTE — Progress Notes (Signed)
Patient did not wish to have her IV restarted because she anticipates discharge today.

## 2011-09-21 NOTE — Progress Notes (Signed)
Clinical Social Work Department CLINICAL SOCIAL WORK PLACEMENT NOTE 09/21/2011  Patient:  [name], [name]  Account Number:  [id number] Admit date:  09/15/2011  Clinical Social Worker:  Truman Hayward, LCSW  Date/time:  09/21/2011 03:00 PM  Clinical Social Work is seeking post-discharge placement for this patient at the following level of care:   SKILLED NURSING   (*CSW will update this form in Epic as items are completed)     Patient/family provided with [facility] Department of Clinical Social Work's list of facilities offering this level of care within the geographic area requested by the patient (or if unable, by the patient's family).    Patient/family informed of their freedom to choose among providers that offer the needed level of care, that participate in Medicare, Medicaid or managed care program needed by the patient, have an available bed and are willing to accept the patient.    Patient/family informed of [facility]' ownership interest in [facility], as well as of the fact that they are under no obligation to receive care at this facility.  PASARR submitted to EDS on  PASARR number received from EDS on   FL2 transmitted to all facilities in geographic area requested by pt/family on   FL2 transmitted to all facilities within larger geographic area on   Patient informed that his/her managed care company has contracts with or will negotiate with  certain facilities, including the following:     Patient/family informed of bed offers received:   Patient chooses bed at [facility] Physician recommends and patient chooses bed at    Patient to be transferred to [facility] on  09/21/2011 Patient to be transferred to facility by facility  The following physician request were entered in Epic:   Additional Comments:

## 2011-09-21 NOTE — Progress Notes (Signed)
Subjective: Wants to go home  Objective: Vital signs in last 24 hours: Temp:  [98 F (36.7 C)-99.4 F (37.4 C)] 98 F (36.7 C) (06/28 0620) Pulse Rate:  [69-73] 70  (06/28 0620) Resp:  [18-20] 20  (06/28 0620) BP: (131-155)/(48-71) 155/71 mmHg (06/28 0620) SpO2:  [90 %-95 %] 90 % (06/28 0620) Weight:  [75.2 kg (165 lb 12.6 oz)] 75.2 kg (165 lb 12.6 oz) (06/28 0500) Last BM Date: 09/20/11  2 stools, good po intake, low residual diet, tm 99.4,  Intake/Output from previous day: 06/27 0701 - 06/28 0700 In: 1980 [P.O.:780; I.V.:1200] Out: 2175 [Urine:2175] Intake/Output this shift: Total I/O In: 120 [P.O.:120] Out: 200 [Urine:200]  General appearance: alert, cooperative and no distress GI: soft, non-tender; bowel sounds normal; no masses,  no organomegaly and complains of some straining with BM and hemmrohoids, this is apparently baseline for her.  Lab Results:   [facility] 09/20/11 0410  WBC 7.9  HGB 8.8*  HCT 28.6*  PLT 283    BMET  Basename 09/21/11 0350 09/20/11 0410  NA 139 138  K 3.5 2.8*  CL 106 104  CO2 24 22  GLUCOSE 115* 93  BUN 7 6  CREATININE 0.83 0.80  CALCIUM 8.5 8.5   PT/INR  Basename 09/21/11 0350 09/20/11 0410  LABPROT 17.2* 17.1*  INR 1.38 1.37     Lab 09/20/11 0410 09/17/11 0345 09/16/11 0615 09/15/11 2114  AST 26 25 27  32  ALT 10 13 14 17   ALKPHOS 56 65 71 82  BILITOT 0.3 0.4 0.6 0.5  PROT 6.6 6.5 6.8 7.6  ALBUMIN 2.7* 2.7* 2.9* 3.4*     Lipase  No results found for this basename: lipase     Studies/Results: Dg Chest 2 View  09/20/2011  *RADIOLOGY REPORT*  Clinical Data: Wheezing.  Shortness of breath, right chest pain.  CHEST - 2 VIEW  Comparison: 09/15/2011  Findings: Heart is upper limits normal size.  Diffuse chronic interstitial prominence, likely chronic interstitial lung disease. Bibasilar atelectasis or scarring.  No visible effusions.  No acute bony abnormality.  IMPRESSION: Chronic interstitial lung disease.   Bibasilar scarring or atelectasis.  Original Report Authenticated By: Cyndie Chime, M.D.    Medications:    . albuterol  2.5 mg Nebulization Q6H  . cholecalciferol  2,000 Units Oral Daily  . ciprofloxacin  500 mg Oral BID  . levothyroxine  88 mcg Oral QAC breakfast  . magnesium oxide  400 mg Oral Daily  . metroNIDAZOLE  500 mg Oral Q8H  . mirtazapine  15 mg Oral QHS  . nisoldipine  17 mg Oral Daily  . olopatadine  1 drop Both Eyes BID  . pantoprazole  40 mg Oral Q1200  . potassium chloride  30 mEq Oral TID WC  . tiotropium  18 mcg Inhalation Daily  . DISCONTD: ciprofloxacin  400 mg Intravenous Q12H  . DISCONTD: levothyroxine  44 mcg Intravenous Daily  . DISCONTD: metronidazole  500 mg Intravenous Q8H  . DISCONTD: pantoprazole (PROTONIX) IV  40 mg Intravenous Q24H    Assessment/Plan Diverticulitis, sigmoid colon with contained perforation. Antibiotics started 6/22.  CC: weakness/abdominal pain  UTI - urine culture 9000 colonies  COPD on home O2 at night.  Chronic renal disease  Anemia  Anticoagulated INR 1.68 (carotid artery occlusion)  Changes in liver ? CIRRHOSIS  Gout  Hypertension  Dyslipidemia  Hiatal Hernia   Plan:  She is doing well with her diverticulitis, she is on a low residual diet.  She can go home on oral  antibiotics and follow up with primary, from our standpoint.      LOS: 6 days    Diaz Crago 09/21/2011

## 2011-09-21 NOTE — Discharge Summary (Signed)
Physician Discharge Summary  [name] VHQ:[id number] DOB: January 21, 1920 DOA: 09/15/2011  PCP: No primary provider on file.  Admit date: 09/15/2011 Discharge date: 09/21/2011  Discharge Diagnoses:  Principal Problem:  *Diverticulitis   Discharge Condition: Stable  Disposition: Back to SNF with on oral antibiotics  Diet: low fiber diet  History of present illness:  Pt is 78 yo female who presents to [facility] with main concern of progressively worsening generalized weakness associated with 2 day history of subjective fevers and chills, suprapubic area pain, cramp-like pain, intermittent in nature, 7/10 in severity when present, non radiating, with no specific aggravating or alleviating factors.Also associated with urinary frequency and urgency. Pt denies similar episodes in the past. Pt denies shortness of breath, chest pain, no other systemic concerns.   Hospital Course:   Diverticulitis (09/16/2011) - Patient presented with abdominal pain, fevers, chills and leukocytosis. Abdominal /Pelvic CT demonstrated stranding around the sigmoid colon compatible with acute diverticulitis. There was extraluminal gas adjacent to the sigmoid colon compatible with contained perforation.  Patient is improving clinically with Conservative management.  Patient has been switched to oral antibiotics and has tolerated well.  WBC  Is WNL and patient has been afebrile.  Leukocytosis  -Resolved on IV abx's initially. Currently patient is afebrile and was switched to oral abx regimen.  - Was secondary to # 1  Anemia  - No active bleeding currently based.  - continue to monitor cbc's  Chronic Kidney disease  - Stable continue to monitor creatinine levels  Hypothyroidism  - Stable patient is on synthroid will continue   COPD  - Patient is on oxygen at home  - Has audible wheezes today will plan on scheduling albuterol q six hours  - albuterol q 2 hours  - Continue Spiriva  - Has chronic  respiratory failure and is on oxygen at home as a result of her COPD  Chronic Anticoagulation  - On Warfarin due to history of Carotid artery occlusive disease. At this point Warfarin is being held in case operation is required. Once diet is resumed and tolerated will plan on continuing as per prior discussion with General surgery. Will plan on continuing most likely tomorrow prior to discharge and have patient follow up with primary care physician for further INR monitoring and dose adjustment in 1 week post d/c.      Discharge Exam: Filed Vitals:   09/21/11 0620  BP: 155/71  Pulse: 70  Temp: 98 F (36.7 C)  Resp: 20   Filed Vitals:   09/21/11 0240 09/21/11 0500 09/21/11 0620 09/21/11 1124  BP:   155/71   Pulse:   70   Temp:   98 F (36.7 C)   TempSrc:   Oral   Resp:   20   Height:      Weight: 75.2 kg (165 lb 12.6 oz) 75.2 kg (165 lb 12.6 oz)    SpO2:   90% 95%   General: Pt in NAD Cardiovascular: Loud systolic murmur, RRR Respiratory: Clear to auscultation, prolonged exp. Phase. Abd: Soft, NT, ND  Discharge Instructions  Discharge Orders    Future Orders Please Complete By Expires   Diet - low sodium heart healthy      Increase activity slowly      Discharge instructions      Comments:   Please follow up with the physician at your SNF upon discharge in ~1 week or sooner should any new concerns arise.   Call MD for:  temperature >  100.4      Call MD for:  persistant nausea and vomiting        Medication List  As of 09/21/2011 12:31 PM   STOP taking these medications         amoxicillin 500 MG capsule         TAKE these medications         acyclovir ointment 5 %   Commonly known as: ZOVIRAX   Apply 1 application topically as needed. For cold sores. Resident may self-administer.      atorvastatin 10 MG tablet   Commonly known as: LIPITOR   Take 10 mg by mouth daily.      cetirizine 10 MG tablet   Commonly known as: ZYRTEC   Take 10 mg by mouth at  bedtime.      chlorhexidine 0.12 % solution   Commonly known as: PERIDEX   Use as directed 15 mLs in the mouth or throat 2 (two) times daily. Swish by mouth for 30 seconds, then spit twice daily. May self-administer.      cholecalciferol 1000 UNITS tablet   Commonly known as: VITAMIN D   Take 2,000 Units by mouth daily.      ciprofloxacin 500 MG tablet   Commonly known as: CIPRO   Take 1 tablet (500 mg total) by mouth 2 (two) times daily.      donepezil 10 MG tablet   Commonly known as: ARICEPT   Take 10 mg by mouth at bedtime.      escitalopram 20 MG tablet   Commonly known as: LEXAPRO   Take 20 mg by mouth daily.      estrogens (conjugated) 0.3 MG tablet   Commonly known as: PREMARIN   Take 0.3 mg by mouth daily.      Eye Vitamins Tabs   Take 1 tablet by mouth 2 (two) times daily.      folic acid 1 MG tablet   Commonly known as: FOLVITE   Take 1 mg by mouth daily.      gabapentin 100 MG capsule   Commonly known as: NEURONTIN   Take 200 mg by mouth at bedtime.      HYDROcodone-acetaminophen 5-325 MG per tablet   Commonly known as: NORCO   Take 1 tablet by mouth every 6 (six) hours as needed. Pain.      hydrocortisone-pramoxine 2.5-1 % rectal cream   Commonly known as: ANALPRAM-HC   Place 1 application rectally 2 (two) times daily as needed. Inflammation. May self-administer.      ipratropium-albuterol 0.5-2.5 (3) MG/3ML Soln   Commonly known as: DUONEB   Take 3 mLs by nebulization every 6 (six) hours as needed. Wheezing and shortness of breath.      levothyroxine 88 MCG tablet   Commonly known as: SYNTHROID, LEVOTHROID   Take 88 mcg by mouth daily. May keep at bedside. (Place with bedtime meds for resident to self-administer early morning.)      lubiprostone 8 MCG capsule   Commonly known as: AMITIZA   Take 8 mcg by mouth at bedtime.      MAPAP 325 MG tablet   Generic drug: acetaminophen   Take 650 mg by mouth See admin instructions. Take 2 tablets every  morning scheduled. Take 2 tablets every 6 hours as needed for pain.      meloxicam 7.5 MG tablet   Commonly known as: MOBIC   Take 7.5 mg by mouth daily.      metroNIDAZOLE 500  MG tablet   Commonly known as: FLAGYL   Take 1 tablet (500 mg total) by mouth every 8 (eight) hours.      mirtazapine 15 MG tablet   Commonly known as: REMERON   Take 15 mg by mouth at bedtime.      nisoldipine 17 MG 24 hr tablet   Commonly known as: SULAR   Take 17 mg by mouth daily.      olopatadine 0.1 % ophthalmic solution   Commonly known as: PATANOL   Place into both eyes 2 (two) times daily as needed. Allergy irritation. May self-administer.      omeprazole 40 MG capsule   Commonly known as: PRILOSEC   Take 40 mg by mouth daily.      polyethylene glycol packet   Commonly known as: MIRALAX / GLYCOLAX   Take 17 g by mouth daily.      saccharomyces boulardii 250 MG capsule   Commonly known as: FLORASTOR   Take 1 capsule (250 mg total) by mouth 2 (two) times daily.      SYSTANE ULTRA 0.4-0.3 % Soln   Generic drug: Polyethyl Glycol-Propyl Glycol   Apply 1 drop to eye as needed. Dryness itching.      tiotropium 18 MCG inhalation capsule   Commonly known as: SPIRIVA   Place 18 mcg into inhaler and inhale daily. May self administer.      warfarin 5 MG tablet   Commonly known as: COUMADIN   Take 5 mg by mouth daily.              The results of significant diagnostics from this hospitalization (including imaging, microbiology, ancillary and laboratory) are listed below for reference.    Significant Diagnostic Studies: Dg Chest 2 View  09/20/2011  *RADIOLOGY REPORT*  Clinical Data: Wheezing.  Shortness of breath, right chest pain.  CHEST - 2 VIEW  Comparison: 09/15/2011  Findings: Heart is upper limits normal size.  Diffuse chronic interstitial prominence, likely chronic interstitial lung disease. Bibasilar atelectasis or scarring.  No visible effusions.  No acute bony abnormality.   IMPRESSION: Chronic interstitial lung disease.  Bibasilar scarring or atelectasis.  Original Report Authenticated By: Cyndie Chime, M.D.   Dg Chest 2 View  09/15/2011  *RADIOLOGY REPORT*  Clinical Data: Fever, cough and chills.  CHEST - 2 VIEW  Comparison: None.  Findings: The lungs are well-aerated.  Minimal bibasilar opacities likely reflect atelectasis.  Chronic peribronchial thickening is noted.  There is no evidence of focal opacification, pleural effusion or pneumothorax.  The heart is borderline normal in size; the mediastinal contour is within normal limits.  No acute osseous abnormalities are seen.  A moderate hiatal hernia is seen.  IMPRESSION:  1.  Minimal bibasilar opacities likely reflect atelectasis; lungs otherwise clear. 2.  Moderate hiatal hernia noted.  Original Report Authenticated By: Tonia Ghent, M.D.   Ct Abdomen Pelvis W Contrast  09/16/2011  *RADIOLOGY REPORT*  Clinical Data: Fever, chills.  CT ABDOMEN AND PELVIS WITH CONTRAST  Technique:  Multidetector CT imaging of the abdomen and pelvis was performed following the standard protocol during bolus administration of intravenous contrast.  Contrast: OMNIPAQUE IOHEXOL 300 MG/ML  SOLN  Comparison: None.  Findings: Moderate sized hiatal hernia.  Linear scarring in the lung bases.  No effusions.  Bullous change in the right lung base.  Slightly nodular contours of the liver surface could reflect mild changes of cirrhosis.  Recommend clinical correlation.  No focal abnormality.  Spleen, pancreas,  adrenals and kidneys are unremarkable except for small cysts in the kidneys bilaterally. Appendix is visualized and is normal.  Small bowel is decompressed.  Inflammatory change around the sigmoid colon, likely sigmoid diverticulitis.  There is extraluminal gas compatible with contained perforation.  No well-defined abscess currently.  Aorta and iliac vessels are heavily calcified, non-aneurysmal.  Degenerative changes throughout the lumbar  spine.  Grade 1 anterolisthesis of L4 on L5 related to facet disease.  No acute bony abnormality.  IMPRESSION: Stranding around the sigmoid colon compatible with acute diverticulitis.  There is extraluminal gas adjacent to the sigmoid colon compatible with contained perforation.  Normal appendix.  Fine nodular contours to the liver suggesting the possibility of mild cirrhosis.  Recommend clinical correlation.  Moderate sized hiatal hernia.  Original Report Authenticated By: Cyndie Chime, M.D.    Microbiology: Recent Results (from the past 240 hour(s))  URINE CULTURE     Status: Normal   Collection Time   09/15/11  8:35 PM      Component Value Range Status Comment   Specimen Description URINE, RANDOM   Final    Special Requests NONE   Final    Culture  Setup Time [id number]   Final    Colony Count 9,000 COLONIES/ML   Final    Culture INSIGNIFICANT GROWTH   Final    Report Status 09/16/2011 FINAL   Final   CULTURE, BLOOD (ROUTINE X 2)     Status: Normal (Preliminary result)   Collection Time   09/15/11  9:14 PM      Component Value Range Status Comment   Specimen Description BLOOD L ARM   Final    Special Requests BOTTLES DRAWN AEROBIC AND ANAEROBIC   Final    Culture  Setup Time [id number]   Final    Culture     Final    Value:        BLOOD CULTURE RECEIVED NO GROWTH TO DATE CULTURE WILL BE HELD FOR 5 DAYS BEFORE ISSUING A FINAL NEGATIVE REPORT   Report Status PENDING   Incomplete   CULTURE, BLOOD (ROUTINE X 2)     Status: Normal (Preliminary result)   Collection Time   09/15/11  9:29 PM      Component Value Range Status Comment   Specimen Description BLOOD R ARM   Final    Special Requests BOTTLES DRAWN AEROBIC AND ANAEROBIC   Final    Culture  Setup Time [id number]   Final    Culture     Final    Value:        BLOOD CULTURE RECEIVED NO GROWTH TO DATE CULTURE WILL BE HELD FOR 5 DAYS BEFORE ISSUING A FINAL NEGATIVE REPORT   Report Status PENDING   Incomplete       Labs: Basic Metabolic Panel:  Lab 09/21/11 3244 09/20/11 0410 09/18/11 0345 09/17/11 0345 09/16/11 0615 09/16/11 0114  NA 139 138 136 136 135 --  K 3.5 2.8* -- -- -- --  CL 106 104 103 104 102 --  CO2 24 22 22 24 24  --  GLUCOSE 115* 93 91 101* 98 --  BUN 7 6 9 11 12  --  CREATININE 0.83 0.80 0.85 1.01 0.93 --  CALCIUM 8.5 8.5 8.3* 8.5 8.5 --  MG -- 1.6 -- -- -- 1.7  PHOS -- -- -- -- -- 2.4   Liver Function Tests:  Lab 09/20/11 0410 09/17/11 0345 09/16/11 0615 09/15/11 2114  AST 26 25 27  32  ALT 10 13 14 17   ALKPHOS 56 65 71 82  BILITOT 0.3 0.4 0.6 0.5  PROT 6.6 6.5 6.8 7.6  ALBUMIN 2.7* 2.7* 2.9* 3.4*   No results found for this basename: LIPASE:5,AMYLASE:5 in the last 168 hours No results found for this basename: AMMONIA:5 in the last 168 hours CBC:  Lab 09/20/11 0410 09/18/11 0345 09/17/11 0345 09/16/11 0615 09/15/11 2114  WBC 7.9 10.0 11.4* 15.7* 16.4*  NEUTROABS -- -- -- -- 12.9*  HGB 8.8* 8.3* 8.1* 8.8* 9.5*  HCT 28.6* 27.8* 27.0* 28.3* 30.8*  MCV 79.0 80.1 79.4 78.4 78.8  PLT 283 221 196 201 243   Cardiac Enzymes:  Lab 09/15/11 2114  CKTOTAL --  CKMB --  CKMBINDEX --  TROPONINI <0.30   BNP: No components found with this basename: POCBNP:5 CBG:  Lab 09/21/11 0717 09/20/11 0816 09/19/11 0806 09/18/11 0749 09/17/11 0735  GLUCAP 111* 151* 95 97 97    Time coordinating discharge: > 35 minutes, placing orders, discussion with patient, documenting, billing.  Signed:  Penny Pia  [facility] 09/21/2011, 12:31 PM

## 2011-09-21 NOTE — Progress Notes (Signed)
PT/OT/ST Cancellation Note  ___Treatment cancelled today due to medical issues with patient which prohibited therapy  ___ Treatment cancelled today due to patient receiving procedure or test   _x__ Treatment cancelled today due to patient's refusal to participate- "I won't need it. I'm going home today."  ___ Treatment cancelled today due to   Signature:  [name], PT [phone]

## 2011-09-22 LAB — CULTURE, BLOOD (ROUTINE X 2): Culture: NO GROWTH

## 2011-09-24 ENCOUNTER — Encounter: Payer: Self-pay | Admitting: Pulmonary Disease

## 2011-09-24 DIAGNOSIS — A0472 Enterocolitis due to Clostridium difficile, not specified as recurrent: Secondary | ICD-10-CM

## 2011-09-24 DIAGNOSIS — K630 Abscess of intestine: Secondary | ICD-10-CM

## 2011-09-24 HISTORY — DX: Enterocolitis due to Clostridium difficile, not specified as recurrent: A04.72

## 2011-09-24 HISTORY — DX: Abscess of intestine: K63.0

## 2011-10-08 ENCOUNTER — Ambulatory Visit (INDEPENDENT_AMBULATORY_CARE_PROVIDER_SITE_OTHER)
Admission: RE | Admit: 2011-10-08 | Discharge: 2011-10-08 | Disposition: A | Discharge status: Home / Self Care | Payer: Medicare Other | Source: Ambulatory Visit | Attending: Pulmonary Disease | Admitting: Pulmonary Disease

## 2011-10-08 DIAGNOSIS — R911 Solitary pulmonary nodule: Secondary | ICD-10-CM

## 2011-10-09 ENCOUNTER — Telehealth: Payer: Self-pay | Admitting: Pulmonary Disease

## 2011-10-09 NOTE — Telephone Encounter (Signed)
Dr. Craige Cotta, please advise results of ct chest thanks

## 2011-10-09 NOTE — Telephone Encounter (Signed)
Ct Chest Wo Contrast  10/08/2011  *RADIOLOGY REPORT*  Clinical Data: Follow-up evaluation of pulmonary nodules.  CT CHEST WITHOUT CONTRAST  Technique:  Multidetector CT imaging of the chest was performed following the standard protocol without IV contrast.  Comparison: Chest CT 06/22/2011.  Findings:  Mediastinum: Heart size is mildly enlarged. There is no significant pericardial fluid, thickening or pericardial calcification. There is atherosclerosis of the thoracic aorta, the great vessels of the mediastinum and the coronary arteries, including calcified atherosclerotic plaque in the left main, left anterior descending, left circumflex and right coronary arteries. Extensive calcifications of the aortic valve and mitral annulus. No pathologically enlarged mediastinal or hilar lymph nodes. Please note that accurate exclusion of hilar adenopathy is limited on noncontrast CT scans. There is a moderate - large hiatal hernia.  Lungs/Pleura: 1 cm ground-glass attenuation nodule in the lateral segment of the right middle lobe (image 39 of series 3) is similar to the prior examination (previously reported as 5 mm, but differences are likely attributable to slice selection and patient position).  Previously noted 1 cm right lower lobe nodule is no longer as nodular in appearance, and is adjacent to an area of architectural distortion and bullous change, favored to represent an evolving area of scarring (best demonstrated on images 33 - 36 of series 3).  No other definite suspicious-appearing pulmonary nodules or masses are identified on today's examination.  There is a background of moderate centrilobular and paraseptal emphysema with areas of architectural distortion throughout the periphery of the upper lobes of the lungs bilaterally (which is likely to represent areas of post infectious inflammatory scarring).  No acute consolidative airspace disease.  Small right-sided pleural effusion is noted layering dependently.   Upper Abdomen: Extensive atherosclerosis.  Musculoskeletal: There are no aggressive appearing lytic or blastic lesions noted in the visualized portions of the skeleton.  IMPRESSION: 1.  Previously noted 1 cm pulmonary nodule in the right lower lobe is no longer as clearly defined, and is favored to represent an area of evolving post infectious/post inflammatory scarring. 2.  There is, however, a 1 cm subsolid nodule in the lateral segment of the right middle lobe which is unchanged in retrospect compared to the prior examination.  An indolent neoplasm such as a slow-growing adenocarcinoma cannot be excluded.  Followup by CT is recommended in 12 months, with continued annual surveillance for a minimum of 3 years.  These recommendations are taken from "Recommendations for the Management of Subsolid Pulmonary Nodules Detected at CT:  A Statement from the Fleischner Society" Radiology 2013; 266:1, 304- 317.  3.  Centrilobular and paraseptal emphysema, as above. 4. Atherosclerosis, including left main and three-vessel coronary artery disease. 5. There are calcifications of the aortic valve and mitral annulus. Echocardiographic correlation for evaluation of potential valvular dysfunction may be warranted if clinically indicated. 6.  Large hiatal hernia.  Original Report Authenticated By: Florencia Reasons, M.D.    She has appointment on October 15, 2011.  There is no change in pulmonary nodule.  No change to current treatment plan.  Will discuss in more detail at next visit.

## 2011-10-09 NOTE — Telephone Encounter (Signed)
Per Emilee Hero has left for the day and she believes Nicholos Johns was wanting chest Ct results faxed to them at [facility], fax # [phone]. She will have Nicholos Johns call the office back tomorrow to be sure this is all she was needing. Results have been faxed.I did contact the pt with the results and she gave the verbal okay to fax the results to [name].

## 2011-10-11 NOTE — Telephone Encounter (Signed)
I spoke to crystal at [facility] and that have the fax, nothing further needed.Carron Curie, CMA

## 2011-10-15 ENCOUNTER — Encounter (HOSPITAL_COMMUNITY): Payer: Self-pay | Admitting: Pulmonary Disease

## 2011-10-15 ENCOUNTER — Inpatient Hospital Stay (HOSPITAL_COMMUNITY)
Admission: AD | Admit: 2011-10-15 | Discharge: 2011-10-24 | DRG: 392 | Disposition: A | Discharge status: Skilled Nursing Facility | Payer: Medicare Other | Source: Ambulatory Visit | Attending: Internal Medicine | Admitting: Internal Medicine

## 2011-10-15 ENCOUNTER — Other Ambulatory Visit: Payer: Self-pay | Admitting: Internal Medicine

## 2011-10-15 ENCOUNTER — Ambulatory Visit
Admission: RE | Admit: 2011-10-15 | Discharge: 2011-10-15 | Disposition: A | Discharge status: Home / Self Care | Payer: Medicare Other | Source: Ambulatory Visit | Attending: Internal Medicine | Admitting: Internal Medicine

## 2011-10-15 ENCOUNTER — Encounter (HOSPITAL_COMMUNITY): Payer: Self-pay | Admitting: Pharmacy Technician

## 2011-10-15 ENCOUNTER — Encounter: Payer: Self-pay | Admitting: Pulmonary Disease

## 2011-10-15 ENCOUNTER — Telehealth: Payer: Self-pay | Admitting: Gastroenterology

## 2011-10-15 ENCOUNTER — Ambulatory Visit (INDEPENDENT_AMBULATORY_CARE_PROVIDER_SITE_OTHER): Payer: Medicare Other | Admitting: Pulmonary Disease

## 2011-10-15 VITALS — BP 142/86 | HR 87 | Temp 98.8°F | Ht 64.0 in | Wt 157.6 lb

## 2011-10-15 DIAGNOSIS — J449 Chronic obstructive pulmonary disease, unspecified: Secondary | ICD-10-CM

## 2011-10-15 DIAGNOSIS — R509 Fever, unspecified: Secondary | ICD-10-CM

## 2011-10-15 DIAGNOSIS — A0472 Enterocolitis due to Clostridium difficile, not specified as recurrent: Secondary | ICD-10-CM

## 2011-10-15 DIAGNOSIS — K5792 Diverticulitis of intestine, part unspecified, without perforation or abscess without bleeding: Secondary | ICD-10-CM

## 2011-10-15 DIAGNOSIS — I509 Heart failure, unspecified: Secondary | ICD-10-CM

## 2011-10-15 DIAGNOSIS — H3530 Unspecified macular degeneration: Secondary | ICD-10-CM | POA: Diagnosis present

## 2011-10-15 DIAGNOSIS — K219 Gastro-esophageal reflux disease without esophagitis: Secondary | ICD-10-CM | POA: Diagnosis present

## 2011-10-15 DIAGNOSIS — D509 Iron deficiency anemia, unspecified: Secondary | ICD-10-CM | POA: Diagnosis present

## 2011-10-15 DIAGNOSIS — K649 Unspecified hemorrhoids: Secondary | ICD-10-CM | POA: Diagnosis not present

## 2011-10-15 DIAGNOSIS — Z8673 Personal history of transient ischemic attack (TIA), and cerebral infarction without residual deficits: Secondary | ICD-10-CM

## 2011-10-15 DIAGNOSIS — I2510 Atherosclerotic heart disease of native coronary artery without angina pectoris: Secondary | ICD-10-CM | POA: Diagnosis present

## 2011-10-15 DIAGNOSIS — I359 Nonrheumatic aortic valve disorder, unspecified: Secondary | ICD-10-CM | POA: Diagnosis present

## 2011-10-15 DIAGNOSIS — E785 Hyperlipidemia, unspecified: Secondary | ICD-10-CM | POA: Diagnosis present

## 2011-10-15 DIAGNOSIS — K630 Abscess of intestine: Secondary | ICD-10-CM

## 2011-10-15 DIAGNOSIS — F0390 Unspecified dementia without behavioral disturbance: Secondary | ICD-10-CM | POA: Diagnosis present

## 2011-10-15 DIAGNOSIS — N189 Chronic kidney disease, unspecified: Secondary | ICD-10-CM | POA: Diagnosis present

## 2011-10-15 DIAGNOSIS — E039 Hypothyroidism, unspecified: Secondary | ICD-10-CM

## 2011-10-15 DIAGNOSIS — J9610 Chronic respiratory failure, unspecified whether with hypoxia or hypercapnia: Secondary | ICD-10-CM

## 2011-10-15 DIAGNOSIS — M109 Gout, unspecified: Secondary | ICD-10-CM | POA: Diagnosis present

## 2011-10-15 DIAGNOSIS — R911 Solitary pulmonary nodule: Secondary | ICD-10-CM

## 2011-10-15 DIAGNOSIS — Z7901 Long term (current) use of anticoagulants: Secondary | ICD-10-CM

## 2011-10-15 DIAGNOSIS — K5732 Diverticulitis of large intestine without perforation or abscess without bleeding: Secondary | ICD-10-CM

## 2011-10-15 DIAGNOSIS — J310 Chronic rhinitis: Secondary | ICD-10-CM

## 2011-10-15 DIAGNOSIS — D649 Anemia, unspecified: Secondary | ICD-10-CM

## 2011-10-15 DIAGNOSIS — J4489 Other specified chronic obstructive pulmonary disease: Secondary | ICD-10-CM

## 2011-10-15 DIAGNOSIS — Z79899 Other long term (current) drug therapy: Secondary | ICD-10-CM

## 2011-10-15 DIAGNOSIS — M129 Arthropathy, unspecified: Secondary | ICD-10-CM | POA: Diagnosis present

## 2011-10-15 DIAGNOSIS — I129 Hypertensive chronic kidney disease with stage 1 through stage 4 chronic kidney disease, or unspecified chronic kidney disease: Secondary | ICD-10-CM | POA: Diagnosis present

## 2011-10-15 DIAGNOSIS — B961 Klebsiella pneumoniae [K. pneumoniae] as the cause of diseases classified elsewhere: Secondary | ICD-10-CM | POA: Diagnosis present

## 2011-10-15 DIAGNOSIS — G40909 Epilepsy, unspecified, not intractable, without status epilepticus: Secondary | ICD-10-CM | POA: Diagnosis present

## 2011-10-15 DIAGNOSIS — A491 Streptococcal infection, unspecified site: Secondary | ICD-10-CM | POA: Diagnosis present

## 2011-10-15 DIAGNOSIS — I6529 Occlusion and stenosis of unspecified carotid artery: Secondary | ICD-10-CM | POA: Diagnosis present

## 2011-10-15 HISTORY — DX: Low back pain, unspecified: M54.50

## 2011-10-15 HISTORY — DX: Gastro-esophageal reflux disease without esophagitis: K21.9

## 2011-10-15 HISTORY — DX: Unspecified macular degeneration: H35.30

## 2011-10-15 HISTORY — DX: Unspecified malaria: B54

## 2011-10-15 HISTORY — DX: Unspecified malignant neoplasm of skin, unspecified: C44.90

## 2011-10-15 HISTORY — DX: Polyneuropathy, unspecified: G62.9

## 2011-10-15 HISTORY — DX: Other chronic pain: G89.29

## 2011-10-15 HISTORY — DX: Cerebral infarction due to unspecified occlusion or stenosis of unspecified cerebral artery: I63.50

## 2011-10-15 HISTORY — DX: Unspecified convulsions: R56.9

## 2011-10-15 HISTORY — DX: Other hemorrhoids: K64.8

## 2011-10-15 HISTORY — DX: Low back pain: M54.5

## 2011-10-15 HISTORY — DX: Unqualified visual loss, both eyes: H54.3

## 2011-10-15 LAB — CBC WITH DIFFERENTIAL/PLATELET
Basophils Absolute: 0 10*3/uL (ref 0.0–0.1)
Basophils Relative: 0 % (ref 0–1)
Eosinophils Absolute: 0.2 10*3/uL (ref 0.0–0.7)
HCT: 30.6 % — ABNORMAL LOW (ref 36.0–46.0)
MCHC: 30.1 g/dL (ref 30.0–36.0)
Monocytes Absolute: 1.2 10*3/uL — ABNORMAL HIGH (ref 0.1–1.0)
Neutro Abs: 7.8 10*3/uL — ABNORMAL HIGH (ref 1.7–7.7)
Neutrophils Relative %: 70 % (ref 43–77)
RDW: 18.5 % — ABNORMAL HIGH (ref 11.5–15.5)

## 2011-10-15 LAB — COMPREHENSIVE METABOLIC PANEL
AST: 24 U/L (ref 0–37)
Albumin: 3.2 g/dL — ABNORMAL LOW (ref 3.5–5.2)
Calcium: 9.1 mg/dL (ref 8.4–10.5)
Chloride: 95 mEq/L — ABNORMAL LOW (ref 96–112)
Creatinine, Ser: 0.91 mg/dL (ref 0.50–1.10)
Total Bilirubin: 0.4 mg/dL (ref 0.3–1.2)
Total Protein: 8.3 g/dL (ref 6.0–8.3)

## 2011-10-15 LAB — PROTIME-INR: INR: 1.52 — ABNORMAL HIGH (ref 0.00–1.49)

## 2011-10-15 MED ORDER — IOHEXOL 300 MG/ML  SOLN
100.0000 mL | Freq: Once | INTRAMUSCULAR | Status: AC | PRN
  Administered 2011-10-15: 100 mL via INTRAVENOUS

## 2011-10-15 MED ORDER — CIPROFLOXACIN IN D5W 400 MG/200ML IV SOLN
400.0000 mg | Freq: Two times a day (BID) | INTRAVENOUS | Status: DC
  Administered 2011-10-15 – 2011-10-17 (×4): 400 mg via INTRAVENOUS
  Filled 2011-10-15 (×5): qty 200

## 2011-10-15 MED ORDER — ALBUTEROL SULFATE (5 MG/ML) 0.5% IN NEBU: 2.5000 mg | INHALATION_SOLUTION | RESPIRATORY_TRACT | Status: DC | PRN

## 2011-10-15 MED ORDER — IPRATROPIUM BROMIDE 0.02 % IN SOLN: 0.5000 mg | RESPIRATORY_TRACT | Status: DC | PRN

## 2011-10-15 MED ORDER — TIOTROPIUM BROMIDE MONOHYDRATE 18 MCG IN CAPS
18.0000 ug | ORAL_CAPSULE | Freq: Every day | RESPIRATORY_TRACT | Status: DC
  Administered 2011-10-17 – 2011-10-22 (×5): 18 ug via RESPIRATORY_TRACT
  Filled 2011-10-15 (×3): qty 5

## 2011-10-15 MED ORDER — LEVOTHYROXINE SODIUM 100 MCG IV SOLR
44.0000 ug | Freq: Every day | INTRAVENOUS | Status: DC
  Administered 2011-10-16 – 2011-10-17 (×2): 44 ug via INTRAVENOUS
  Filled 2011-10-15 (×3): qty 2.2

## 2011-10-15 MED ORDER — SODIUM CHLORIDE 0.9 % IV SOLN
INTRAVENOUS | Status: DC
  Administered 2011-10-15 – 2011-10-16 (×2): via INTRAVENOUS

## 2011-10-15 MED ORDER — MORPHINE SULFATE 2 MG/ML IJ SOLN
1.0000 mg | INTRAMUSCULAR | Status: DC | PRN
  Administered 2011-10-16 (×3): 1 mg via INTRAVENOUS
  Filled 2011-10-15 (×3): qty 1

## 2011-10-15 MED ORDER — PANTOPRAZOLE SODIUM 40 MG IV SOLR
40.0000 mg | INTRAVENOUS | Status: DC
  Administered 2011-10-15 – 2011-10-16 (×2): 40 mg via INTRAVENOUS
  Filled 2011-10-15 (×3): qty 40

## 2011-10-15 MED ORDER — METRONIDAZOLE IN NACL 5-0.79 MG/ML-% IV SOLN
500.0000 mg | Freq: Three times a day (TID) | INTRAVENOUS | Status: DC
  Administered 2011-10-15 – 2011-10-17 (×6): 500 mg via INTRAVENOUS
  Filled 2011-10-15 (×7): qty 100

## 2011-10-15 MED ORDER — FLUTICASONE PROPIONATE 50 MCG/ACT NA SUSP
2.0000 | Freq: Every day | NASAL | Status: DC | PRN
  Filled 2011-10-15: qty 16

## 2011-10-15 MED ORDER — BIOTENE DRY MOUTH MT LIQD
15.0000 mL | Freq: Two times a day (BID) | OROMUCOSAL | Status: DC
  Administered 2011-10-16 – 2011-10-23 (×7): 15 mL via OROMUCOSAL

## 2011-10-15 MED ORDER — OLOPATADINE HCL 0.1 % OP SOLN
1.0000 [drp] | Freq: Two times a day (BID) | OPHTHALMIC | Status: DC
  Administered 2011-10-15 – 2011-10-24 (×5): 1 [drp] via OPHTHALMIC
  Filled 2011-10-15: qty 5

## 2011-10-15 MED ORDER — IPRATROPIUM-ALBUTEROL 0.5-2.5 (3) MG/3ML IN SOLN: 3.0000 mL | RESPIRATORY_TRACT | Status: DC | PRN

## 2011-10-15 MED ORDER — IOHEXOL 300 MG/ML  SOLN
30.0000 mL | Freq: Once | INTRAMUSCULAR | Status: AC | PRN
  Administered 2011-10-15: 30 mL via ORAL

## 2011-10-15 NOTE — H&P (Signed)
CC: Abdominal pain.  History of Present Illness: [name] is a 78 y.o. female hospitalized from 09/15/11 to 09/21/11 with diverticulitis.  She had persistent abdominal pain and constipation.  CT abd/pelvis 10/15/11 showed diverticular abscess.  [name] is followed in the pulmonary office for COPD, chronic hypoxic respiratory failure, and lung nodule.  She was schedule for follow up on 07/22 in pulmonary office for review of CT chest.  During this evaluation she complained of constipation and persistent lower abdominal pain.  She has also notice low grade intermittent temperatures.  She denies nausea or vomiting.  She had outpatient CT abd/pelvis which showed 4.7 cm diverticular abscess.  As a result she was advised she would need hospital admission.  Past Medical History  Diagnosis Date  . Cholelithiases   . CHF (congestive heart failure)   . Hypothyroid   . CAD (coronary artery disease)   . Leg paresthesia   . Macular degeneration   . Aortic valve stenosis   . CVA (cerebral infarction)   . Arthritis   . Hyperlipidemia   . Dementia   . Hypoxemia   . Carotid artery occlusion   . Renal disorder   . Hypertension   . COPD (chronic obstructive pulmonary disease)   . Gout   . Hyperlipemia   . Shortness of breath   . Pneumonia   . SEIZURE DISORDER 11/05/2006  . GERD 11/05/2006  . CVA 02/07/2007  . CONGESTIVE HEART FAILURE 09/13/2006  . HYPOTHYROIDISM 09/13/2006    Past Surgical History  Procedure Date  . Vesicovaginal fistula closure w/ tah   . Tonsillectomy   . Cataract extraction   . Breast surgery   . Abdominal hysterectomy     Current Facility-Administered Medications on File Prior to Encounter  Medication Dose Route Frequency Provider Last Rate Last Dose  . iohexol (OMNIPAQUE) 300 MG/ML solution 100 mL  100 mL Intravenous Once PRN Medication Radiologist, MD   100 mL at 10/15/11 1454  . iohexol (OMNIPAQUE) 300 MG/ML solution 30 mL  30 mL Oral Once PRN  Medication Radiologist, MD   30 mL at 10/15/11 1454   Current Outpatient Prescriptions on File Prior to Encounter  Medication Sig Dispense Refill  . acetaminophen (MAPAP) 325 MG tablet Take 650 mg by mouth See admin instructions. Take 2 tablets every morning scheduled. Take 2 tablets every 6 hours as needed for pain.      Marland Kitchen acyclovir ointment (ZOVIRAX) 5 % Apply 1 application topically as needed. For cold sores. Resident may self-administer.      Marland Kitchen amoxicillin (AMOXIL) 500 MG capsule 4 capsules prior to dental work       . atorvastatin (LIPITOR) 10 MG tablet Take 10 mg by mouth daily.      . cetirizine (ZYRTEC) 10 MG tablet Take 10 mg by mouth at bedtime.      . chlorhexidine (PERIDEX) 0.12 % solution Use as directed 15 mLs in the mouth or throat 2 (two) times daily. Swish by mouth for 30 seconds, then spit twice daily. May self-administer.      . cholecalciferol (VITAMIN D) 1000 UNITS tablet Take 2,000 Units by mouth daily.      Marland Kitchen donepezil (ARICEPT) 10 MG tablet Take 10 mg by mouth at bedtime.      Marland Kitchen escitalopram (LEXAPRO) 20 MG tablet Take 20 mg by mouth daily.      Marland Kitchen estrogens, conjugated, (PREMARIN) 0.3 MG tablet Take 0.3 mg by mouth daily.       Marland Kitchen  fluticasone (FLONASE) 50 MCG/ACT nasal spray Place 2 sprays into the nose daily as needed.      . folic acid (FOLVITE) 1 MG tablet Take 1 mg by mouth daily.      Marland Kitchen gabapentin (NEURONTIN) 100 MG capsule Take 200 mg by mouth at bedtime.      Marland Kitchen HYDROcodone-acetaminophen (NORCO) 5-325 MG per tablet Take 1 tablet by mouth every 6 (six) hours as needed. Pain.      . hydrocortisone-pramoxine (ANALPRAM-HC) 2.5-1 % rectal cream Place 1 application rectally 2 (two) times daily as needed. Inflammation. May self-administer.      Marland Kitchen ipratropium-albuterol (DUONEB) 0.5-2.5 (3) MG/3ML SOLN Take 3 mLs by nebulization every 6 (six) hours as needed. Wheezing and shortness of breath.      . levothyroxine (SYNTHROID, LEVOTHROID) 88 MCG tablet Take 88 mcg by mouth  daily. May keep at bedside. (Place with bedtime meds for resident to self-administer early morning.)      . lubiprostone (AMITIZA) 8 MCG capsule Take 8 mcg by mouth at bedtime.      . meloxicam (MOBIC) 7.5 MG tablet Take 7.5 mg by mouth daily.      . mirtazapine (REMERON) 15 MG tablet Take 15 mg by mouth at bedtime.      . Multiple Vitamins-Minerals (EYE VITAMINS) TABS Take 1 tablet by mouth 2 (two) times daily.      . nisoldipine (SULAR) 17 MG 24 hr tablet Take 17 mg by mouth daily.      Marland Kitchen olopatadine (PATANOL) 0.1 % ophthalmic solution Place into both eyes 2 (two) times daily as needed. Allergy irritation. May self-administer.      Marland Kitchen omeprazole (PRILOSEC) 40 MG capsule Take 40 mg by mouth daily.      Bertram Gala Glycol-Propyl Glycol (SYSTANE ULTRA) 0.4-0.3 % SOLN Apply 1 drop to eye as needed. Dryness itching.      . polyethylene glycol (MIRALAX / GLYCOLAX) packet Take 17 g by mouth daily.      Marland Kitchen senna (SENOKOT) 8.6 MG TABS Take 2 tablets by mouth daily as needed.      . tiotropium (SPIRIVA) 18 MCG inhalation capsule Place 18 mcg into inhaler and inhale daily. May self administer.      . warfarin (COUMADIN) 5 MG tablet Take 5 mg by mouth daily.      Marland Kitchen DISCONTD: atorvastatin (LIPITOR) 10 MG tablet Take 10 mg by mouth daily.        Marland Kitchen DISCONTD: cetirizine (ZYRTEC) 10 MG tablet Take 10 mg by mouth at bedtime.        Marland Kitchen DISCONTD: donepezil (ARICEPT) 10 MG tablet Take 10 mg by mouth at bedtime.       Marland Kitchen DISCONTD: escitalopram (LEXAPRO) 20 MG tablet Take 20 mg by mouth daily.        Marland Kitchen DISCONTD: estrogens, conjugated, (PREMARIN) 0.3 MG tablet Take 0.3 mg by mouth daily. Take daily for 21 days then do not take for 7 days.       Marland Kitchen DISCONTD: gabapentin (NEURONTIN) 100 MG capsule 2 at bedtime       . DISCONTD: ipratropium-albuterol (DUONEB) 0.5-2.5 (3) MG/3ML SOLN Take 3 mLs by nebulization every 6 (six) hours as needed.        Marland Kitchen DISCONTD: levothyroxine (SYNTHROID, LEVOTHROID) 88 MCG tablet Take 88 mcg by  mouth daily.        Marland Kitchen DISCONTD: mirtazapine (REMERON) 15 MG tablet Take 15 mg by mouth at bedtime.        Marland Kitchen  DISCONTD: nisoldipine (SULAR) 8.5 MG 24 hr tablet Take 17 mg by mouth daily.       Marland Kitchen DISCONTD: tiotropium (SPIRIVA) 18 MCG inhalation capsule Place 18 mcg into inhaler and inhale daily.        Marland Kitchen DISCONTD: warfarin (COUMADIN) 5 MG tablet Take 5 mg by mouth daily.          Allergies  Allergen Reactions  . Cortisone Other (See Comments)    Unknown   . Minocin (Minocycline Hcl) Other (See Comments)    Unknown  . Minocycline Hcl   . Neomycin-Bacitracin Zn-Polymyx   . Neosporin (Neomycin-Bacitracin Zn-Polymyx) Other (See Comments)    Unknown  . Other Other (See Comments)    Steroidal Neuromuscular Blockers  . Tetanus Toxoids Other (See Comments)    Unknown    Family History  Problem Relation Age of Onset  . Hyperlipidemia    . Hypertension    . Stroke    . Colon cancer Daughter   . Heart attack Father   . Kidney failure Father     History  Substance Use Topics  . Smoking status: Former Smoker -- 1.0 packs/day for 50 years    Types: Cigarettes    Quit date: 08/08/1993  . Smokeless tobacco: Not on file  . Alcohol Use: No   ROS: She has intermittent cough with clear sputum.  She denies chest pain.  She is limited in her activity related to her breathing.  Physical Exam: Filed Vitals:   10/15/11 1643  BP: 128/69  Pulse: 77  Temp: 100 F (37.8 C)  TempSrc: Oral  Resp: 18  Height: 5\' 4"  (1.626 m)  Weight: 160 lb 11.2 oz (72.893 kg)  SpO2: 90%    Wt Readings from Last 3 Encounters:  10/15/11 160 lb 11.2 oz (72.893 kg)  10/15/11 157 lb 9.6 oz (71.487 kg)  09/21/11 165 lb 12.6 oz (75.2 kg)    Body mass index is 27.58 kg/(m^2).   General - Appears uncomfortable ENT - no sinus tenderness, no oral exudate, no LAN, no thyromegaly Cardiac - s1s2 regular, 3/6 SM, pulses symmetric Chest - prolonged exhalation, no wheeze/rales Back - no focal tenderness Abd -  soft, mild tenderness in lower quadrants b/l, no rebound/guarding, + bowel sounds Ext - normal motor strength Neuro - Cranial nerves are normal. PERLA. EOM's intact. Skin - no discernible active dermatitis, erythema, urticaria or inflammatory process. Psych - normal mood, and behavior.   Ct Abdomen Pelvis W Contrast  10/15/2011  **ADDENDUM** CREATED: 10/15/2011 15:27:49  These results were called by telephone on 10/15/2011 at 1528 hours to Dr. Murray Hodgkins, who verbally acknowledged these results.  **END ADDENDUM** SIGNED BY: Charline Bills, M.D.   10/15/2011  *RADIOLOGY REPORT*  Clinical Data: Follow-up diverticulitis with small contained perforation, lower abdominal cramping x3 weeks  CT ABDOMEN AND PELVIS WITH CONTRAST  Technique:  Multidetector CT imaging of the abdomen and pelvis was performed following the standard protocol during bolus administration of intravenous contrast.  Contrast: 100 ml Omnipaque-300 IV  Comparison: 09/16/2010  Findings: Paraseptal emphysematous changes in the right lower lobe.  Moderate hiatal hernia.  Mildly nodular hepatic contour (series 3/image 21), raising the possibility of cirrhosis.  No focal hepatic lesion.  Spleen, pancreas, and adrenal glands are within normal limits.  Gallbladder is underdistended.  No intrahepatic or extrahepatic ductal dilatation.  Kidneys are within normal limits.  No hydronephrosis.  No evidence of bowel obstruction.  Normal appendix.  Sigmoid diverticulitis with associated 3.9 x  4.6 x 4.7 cm diverticular abscess (series 3/image 57).  Extensive atherosclerotic calcifications of the abdominal aorta and branch vessels.  No abdominopelvic ascites.  No suspicious abdominopelvic lymphadenopathy.  Status post hysterectomy.  No adnexal masses.  Bladder is within normal limits.  Degenerative changes of the visualized thoracolumbar spine.  IMPRESSION: Sigmoid diverticulitis with associated 4.7 cm diverticular abscess.  Additional stable ancillary  findings as above. Original Report Authenticated By: Charline Bills, M.D.    Lab Results  Component Value Date   WBC 7.9 09/20/2011   HGB 8.8* 09/20/2011   HCT 28.6* 09/20/2011   MCV 79.0 09/20/2011   PLT 283 09/20/2011    Lab Results  Component Value Date   CREATININE 0.83 09/21/2011   BUN 7 09/21/2011   NA 139 09/21/2011   K 3.5 09/21/2011   CL 106 09/21/2011   CO2 24 09/21/2011    Lab Results  Component Value Date   ALT 10 09/20/2011   AST 26 09/20/2011   ALKPHOS 56 09/20/2011   BILITOT 0.3 09/20/2011    Lab Results  Component Value Date   TSH 3.934 Test methodology is 3rd generation TSH 09/18/2007    BNP    Component Value Date/Time   PROBNP 124.0* 09/18/2007 0515     Assessment/Plan:  A: Diverticular abscess P: NPO for now Consulted IR and general surgery D1/x Cipro, Flagyl IV fluids PRN low dose morphine for pain control   A: Hx of COPD P: Continue spiriva PRN nebulizer therapy  A: Chronic hypoxic respiratory failure P: Supplemental oxygen to keep SpO2 > 92%  A: Hx of HTN, CHF, CAD, Aortic stenosis P: Hold coumadin in anticipation that she may need procedure for her diverticular abscess F/u INR>>may need to start heparin gtt as transition  A: Chronic kidney disease P: F/u renal fx, monitor urine outpt  A: Hypothyroidism P: Synthroid IV until able to take po  I had detailed discussion with her.  Explained that drain by IR may be an option in conjunction with antibiotics.  Will ask for surgery to evaluate also, but I am concerned that she would be too high risk for trip to OR.  Coralyn Helling, MD [facility] Pager:  [phone] 10/15/2011, 4:58 PM

## 2011-10-15 NOTE — Telephone Encounter (Signed)
Checked on CT results and now pt is being seen by Dr Craige Cotta; spoke with his nurse and they are aware of the CT results. lmom for Dr Chilton Si to call back if he needs our assistance.

## 2011-10-15 NOTE — Assessment & Plan Note (Signed)
Stable on her current regimen of spiriva and combivent.

## 2011-10-15 NOTE — Telephone Encounter (Signed)
Dr Craige Cotta is admitting the pt per Mindy, Dr Evlyn Courier nurse.

## 2011-10-15 NOTE — Assessment & Plan Note (Signed)
Stable to improved findings.  She would not want any additional interventions for this.  No additional radiographic follow up will be needed.

## 2011-10-15 NOTE — Telephone Encounter (Signed)
Pt with family hx of colon ca, diverticulosis, rectal stenosis with constipation on Amitiza and Miralax. Last Flex Sig 10/10//2010. When I tried to call pt, EPIC shows her at [facility] having a COLON.

## 2011-10-15 NOTE — Consult Note (Signed)
Reason for Consult:Diverticular abscess  Referring Physician: Kayliegh Boyers is an 78 y.o. female.  HPI: Admitted recently 6/22 through 09/21/11 for sigmoid diverticulitis with evidence of perforation, but no abscess. She had persistent abdominal pain and constipation. CT abd/pelvis 10/15/11 showed diverticular abscess.  [name] is followed in the pulmonary office for COPD, chronic hypoxic respiratory failure, and lung nodule. She was schedule for follow up on 07/22 in pulmonary office for review of CT chest. During this evaluation she complained of constipation and persistent lower abdominal pain. She has also notice low grade intermittent temperatures. She denies nausea or vomiting.  She had outpatient CT abd/pelvis which showed 4.7 cm diverticular abscess. As a result she was advised she would need hospital admission     Past Medical History  Diagnosis Date  . Cholelithiases   . CHF (congestive heart failure)   . Hypothyroid   . CAD (coronary artery disease)   . Leg paresthesia   . Macular degeneration   . Aortic valve stenosis   . CVA (cerebral infarction)   . Arthritis   . Hyperlipidemia   . Dementia   . Hypoxemia   . Carotid artery occlusion   . Renal disorder   . Hypertension   . COPD (chronic obstructive pulmonary disease)   . Gout   . Hyperlipemia   . Shortness of breath   . Pneumonia   . SEIZURE DISORDER 11/05/2006  . GERD 11/05/2006  . CVA 02/07/2007  . CONGESTIVE HEART FAILURE 09/13/2006  . HYPOTHYROIDISM 09/13/2006    Past Surgical History  Procedure Date  . Vesicovaginal fistula closure w/ tah   . Tonsillectomy   . Cataract extraction   . Breast surgery   . Abdominal hysterectomy     Family History  Problem Relation Age of Onset  . Hyperlipidemia    . Hypertension    . Stroke    . Colon cancer Daughter   . Heart attack Father   . Kidney failure Father     Social History:  reports that she quit smoking about 18 years ago. Her smoking use  included Cigarettes. She has a 50 pack-year smoking history. She does not have any smokeless tobacco history on file. She reports that she does not drink alcohol or use illicit drugs.  Allergies:  Allergies  Allergen Reactions  . Cortisone Other (See Comments)    Unknown   . Minocin (Minocycline Hcl) Other (See Comments)    Unknown  . Minocycline Hcl   . Neomycin-Bacitracin Zn-Polymyx   . Neosporin (Neomycin-Bacitracin Zn-Polymyx) Other (See Comments)    Unknown  . Other Other (See Comments)    Steroidal Neuromuscular Blockers  . Tetanus Toxoids Other (See Comments)    Unknown    Medications: I have reviewed the patient's current medications.  Results for orders placed during the hospital encounter of 10/15/11 (from the past 48 hour(s))  CBC WITH DIFFERENTIAL     Status: Abnormal   Collection Time   10/15/11  4:48 PM      Component Value Range Comment   WBC 11.2 (*) 4.0 - 10.5 K/uL    RBC 3.85 (*) 3.87 - 5.11 MIL/uL    Hemoglobin 9.2 (*) 12.0 - 15.0 g/dL    HCT 16.1 (*) 09.6 - 46.0 %    MCV 79.5  78.0 - 100.0 fL    MCH 23.9 (*) 26.0 - 34.0 pg    MCHC 30.1  30.0 - 36.0 g/dL    RDW 04.5 (*) 40.9 -  15.5 %    Platelets 270  150 - 400 K/uL    Neutrophils Relative 70  43 - 77 %    Neutro Abs 7.8 (*) 1.7 - 7.7 K/uL    Lymphocytes Relative 17  12 - 46 %    Lymphs Abs 1.9  0.7 - 4.0 K/uL    Monocytes Relative 11  3 - 12 %    Monocytes Absolute 1.2 (*) 0.1 - 1.0 K/uL    Eosinophils Relative 2  0 - 5 %    Eosinophils Absolute 0.2  0.0 - 0.7 K/uL    Basophils Relative 0  0 - 1 %    Basophils Absolute 0.0  0.0 - 0.1 K/uL   COMPREHENSIVE METABOLIC PANEL     Status: Abnormal   Collection Time   10/15/11  4:48 PM      Component Value Range Comment   Sodium 132 (*) 135 - 145 mEq/L    Potassium 4.4  3.5 - 5.1 mEq/L    Chloride 95 (*) 96 - 112 mEq/L    CO2 26  19 - 32 mEq/L    Glucose, Bld 97  70 - 99 mg/dL    BUN 11  6 - 23 mg/dL    Creatinine, Ser 4.09  0.50 - 1.10 mg/dL     Calcium 9.1  8.4 - 10.5 mg/dL    Total Protein 8.3  6.0 - 8.3 g/dL    Albumin 3.2 (*) 3.5 - 5.2 g/dL    AST 24  0 - 37 U/L    ALT 9  0 - 35 U/L    Alkaline Phosphatase 76  39 - 117 U/L    Total Bilirubin 0.4  0.3 - 1.2 mg/dL    GFR calc non Af Amer 54 (*) >90 mL/min    GFR calc Af Amer 62 (*) >90 mL/min   PROTIME-INR     Status: Abnormal   Collection Time   10/15/11  4:48 PM      Component Value Range Comment   Prothrombin Time 18.6 (*) 11.6 - 15.2 seconds    INR 1.52 (*) 0.00 - 1.49     Ct Abdomen Pelvis W Contrast  10/15/2011  **ADDENDUM** CREATED: 10/15/2011 15:27:49  These results were called by telephone on 10/15/2011 at 1528 hours to Dr. Murray Hodgkins, who verbally acknowledged these results.  **END ADDENDUM** SIGNED BY: Charline Bills, M.D.   10/15/2011  *RADIOLOGY REPORT*  Clinical Data: Follow-up diverticulitis with small contained perforation, lower abdominal cramping x3 weeks  CT ABDOMEN AND PELVIS WITH CONTRAST  Technique:  Multidetector CT imaging of the abdomen and pelvis was performed following the standard protocol during bolus administration of intravenous contrast.  Contrast: 100 ml Omnipaque-300 IV  Comparison: 09/16/2010  Findings: Paraseptal emphysematous changes in the right lower lobe.  Moderate hiatal hernia.  Mildly nodular hepatic contour (series 3/image 21), raising the possibility of cirrhosis.  No focal hepatic lesion.  Spleen, pancreas, and adrenal glands are within normal limits.  Gallbladder is underdistended.  No intrahepatic or extrahepatic ductal dilatation.  Kidneys are within normal limits.  No hydronephrosis.  No evidence of bowel obstruction.  Normal appendix.  Sigmoid diverticulitis with associated 3.9 x 4.6 x 4.7 cm diverticular abscess (series 3/image 57).  Extensive atherosclerotic calcifications of the abdominal aorta and branch vessels.  No abdominopelvic ascites.  No suspicious abdominopelvic lymphadenopathy.  Status post hysterectomy.  No adnexal  masses.  Bladder is within normal limits.  Degenerative changes of the visualized  thoracolumbar spine.  IMPRESSION: Sigmoid diverticulitis with associated 4.7 cm diverticular abscess.  Additional stable ancillary findings as above. Original Report Authenticated By: Charline Bills, M.D.    ROS Blood pressure 128/69, pulse 77, temperature 100 F (37.8 C), temperature source Oral, resp. rate 18, height 5\' 4"  (1.626 m), weight 160 lb 11.2 oz (72.893 kg), SpO2 90.00%. Physical Exam General - Appears uncomfortable   Cardiac - RRR Chest - prolonged exhalation, no wheeze/rales  Abd - soft, mildly tender in lower abdomen Ext - normal motor strength  Neuro - Cranial nerves are normal. PERLA. EOM's intact.  Skin - no discernible active dermatitis, erythema, urticaria or inflammatory process.  Psych - normal mood, and behavior.  Assessment/Plan: 78 yo female with multiple medical comorbidities and sigmoid diverticulitis with 4.7 cm diverticular abscess.   Recs:  Agree with IR drainage of diverticular abscess.  The patient has medical comorbidities that might make her higher risk for colon resection/ descending colostomy.    Raini Tiley K. 10/15/2011, 6:19 PM

## 2011-10-15 NOTE — Progress Notes (Signed)
Luann Aspinwall [id number] Code Status: not addressed   Admission Data: 10/15/2011 1630 Attending Provider:  Craige Cotta JXB:JYNWG, Lenon Curt, MD Consults/ Treatment Team:    [name] is a 78 y.o. female patient admitted from ED  No acute distress noted.  No c/o shortness of breath, no c/o chest pain.    Blood pressure 128/69, pulse 77, temperature 100 F (37.8 C), temperature source Oral, resp. rate 18, height 5\' 4"  (1.626 m), weight 72.893 kg (160 lb 11.2 oz), SpO2 90.00%.  O2:   @ 0 LPM via n/a  IV Fluids:  IV in place, occlusive dsg intact without redness, IV cath wrist left, condition patent and no redness normal saline.   Allergies:  Cortisone; Minocin; Minocycline hcl; Neomycin-bacitracin zn-polymyx; Neosporin; Other; and Tetanus toxoids  Past Medical History:   has a past medical history of Cholelithiases; CHF (congestive heart failure); Hypothyroid; CAD (coronary artery disease); Leg paresthesia; Macular degeneration; Aortic valve stenosis; CVA (cerebral infarction); Arthritis; Hyperlipidemia; Dementia; Hypoxemia; Carotid artery occlusion; Renal disorder; Hypertension; COPD (chronic obstructive pulmonary disease); Gout; Hyperlipemia; Shortness of breath; Pneumonia; SEIZURE DISORDER (11/05/2006); GERD (11/05/2006); CVA (02/07/2007); CONGESTIVE HEART FAILURE (09/13/2006); and HYPOTHYROIDISM (09/13/2006).  Past Surgical History:   has past surgical history that includes Vesicovaginal fistula closure w/ TAH; Tonsillectomy; Cataract extraction; Breast surgery; and Abdominal hysterectomy.  Social History:   reports that she quit smoking about 18 years ago. Her smoking use included Cigarettes. She has a 50 pack-year smoking history. She does not have any smokeless tobacco history on file. She reports that she does not drink alcohol or use illicit drugs.  Skin: intact  Orientation to room, and floor completed with information packet given to patient/family. Admission INP armband ID  verified with patient/family, and in place.   SR up x 2, fall assessment complete, with patient and family able to verbalize understanding of risk associated with falls, and verbalized understanding to call for assistance before getting out of bed.   Call light within reach. Patient able to voice and demonstrate understanding of unit orientation instructions.        Will cont to eval and treat per MD orders.  Susann Givens, RN, [facility] 10/15/2011 5:27 PM

## 2011-10-15 NOTE — Assessment & Plan Note (Signed)
She is to be admitted to [facility] for evaluation of her abscess.

## 2011-10-15 NOTE — Assessment & Plan Note (Signed)
She is to continue supplemental oxygen at night. 

## 2011-10-15 NOTE — Progress Notes (Signed)
Chief Complaint  Patient presents with  . Follow-up    w/ CT done today. still sob w/ exertion. denies any cough, wheezing, chest tx    History of Present Illness: [name] is a 78 y.o. female COPD, nocturnal hypoxia, and recurrent pneumonia.  She was scheduled for visit to assess CT chest and pulmonary nodule.  She c/o persistent abdominal pain and constipation.  She reported having CT abdomin this morning, and results showed diverticular abscess.   Past Medical History  Diagnosis Date  . Cholelithiases   . CHF (congestive heart failure)   . Hypothyroid   . CAD (coronary artery disease)   . Leg paresthesia   . Macular degeneration   . Aortic valve stenosis   . CVA (cerebral infarction)   . Arthritis   . Hyperlipidemia   . Dementia   . Hypoxemia   . Carotid artery occlusion   . Renal disorder   . Hypertension   . COPD (chronic obstructive pulmonary disease)   . Gout   . Hyperlipemia   . Shortness of breath   . Pneumonia     Past Surgical History  Procedure Date  . Vesicovaginal fistula closure w/ tah   . Tonsillectomy   . Cataract extraction   . Breast surgery   . Abdominal hysterectomy     Allergies  Allergen Reactions  . Cortisone Other (See Comments)    Unknown   . Minocin (Minocycline Hcl) Other (See Comments)    Unknown  . Minocycline Hcl   . Neomycin-Bacitracin Zn-Polymyx   . Neosporin (Neomycin-Bacitracin Zn-Polymyx) Other (See Comments)    Unknown  . Other Other (See Comments)    Steroidal Neuromuscular Blockers  . Tetanus Toxoids Other (See Comments)    Unknown    Physical Exam:  Blood pressure 142/86, pulse 87, temperature 98.8 F (37.1 C), temperature source Oral, height 5\' 4"  (1.626 m), weight 157 lb 9.6 oz (71.487 kg), SpO2 89.00%. Body mass index is 27.05 kg/(m^2). Wt Readings from Last 2 Encounters:  10/15/11 157 lb 9.6 oz (71.487 kg)  09/21/11 165 lb 12.6 oz (75.2 kg)    General - Appears uncomfortable  ENT - no  sinus tenderness, no oral exudate, no LAN, no thyromegaly  Cardiac - s1s2 regular, 3/6 SM, pulses symmetric  Chest - prolonged exhalation, no wheeze/rales  Back - no focal tenderness  Abd - soft, mild tenderness in lower quadrants b/l, no rebound/guarding, + bowel sounds  Ext - normal motor strength  Neuro - Cranial nerves are normal. PERLA. EOM's intact.  Skin - no discernible active dermatitis, erythema, urticaria or inflammatory process.  Psych - normal mood, and behavior.   Ct Chest Wo Contrast  10/08/2011 *RADIOLOGY REPORT* Clinical Data: Follow-up evaluation of pulmonary nodules. CT CHEST WITHOUT CONTRAST Technique: Multidetector CT imaging of the chest was performed following the standard protocol without IV contrast. Comparison: Chest CT 06/22/2011. Findings: Mediastinum: Heart size is mildly enlarged. There is no significant pericardial fluid, thickening or pericardial calcification. There is atherosclerosis of the thoracic aorta, the great vessels of the mediastinum and the coronary arteries, including calcified atherosclerotic plaque in the left main, left anterior descending, left circumflex and right coronary arteries. Extensive calcifications of the aortic valve and mitral annulus. No pathologically enlarged mediastinal or hilar lymph nodes. Please note that accurate exclusion of hilar adenopathy is limited on noncontrast CT scans. There is a moderate - large hiatal hernia. Lungs/Pleura: 1 cm ground-glass attenuation nodule in the lateral segment of the right middle  lobe (image 39 of series 3) is similar to the prior examination (previously reported as 5 mm, but differences are likely attributable to slice selection and patient position). Previously noted 1 cm right lower lobe nodule is no longer as nodular in appearance, and is adjacent to an area of architectural distortion and bullous change, favored to represent an evolving area of scarring (best demonstrated on images 33 - 36 of series  3). No other definite suspicious-appearing pulmonary nodules or masses are identified on today's examination. There is a background of moderate centrilobular and paraseptal emphysema with areas of architectural distortion throughout the periphery of the upper lobes of the lungs bilaterally (which is likely to represent areas of post infectious inflammatory scarring). No acute consolidative airspace disease. Small right-sided pleural effusion is noted layering dependently. Upper Abdomen: Extensive atherosclerosis. Musculoskeletal: There are no aggressive appearing lytic or blastic lesions noted in the visualized portions of the skeleton.  IMPRESSION: 1. Previously noted 1 cm pulmonary nodule in the right lower lobe is no longer as clearly defined, and is favored to represent an area of evolving post infectious/post inflammatory scarring. 2. There is, however, a 1 cm subsolid nodule in the lateral segment of the right middle lobe which is unchanged in retrospect compared to the prior examination. An indolent neoplasm such as a slow-growing adenocarcinoma cannot be excluded. Followup by CT is recommended in 12 months, with continued annual surveillance for a minimum of 3 years. These recommendations are taken from "Recommendations for the Management of Subsolid Pulmonary Nodules Detected at CT: A Statement from the Fleischner Society" Radiology 2013; 266:1, 304- 317. 3. Centrilobular and paraseptal emphysema, as above. 4. Atherosclerosis, including left main and three-vessel coronary artery disease. 5. There are calcifications of the aortic valve and mitral annulus. Echocardiographic correlation for evaluation of potential valvular dysfunction may be warranted if clinically indicated. 6. Large hiatal hernia. Original Report Authenticated By: Florencia Reasons, M.D.   Ct Abdomen Pelvis W Contrast  10/15/2011  **ADDENDUM** CREATED: 10/15/2011 15:27:49  These results were called by telephone on 10/15/2011 at 1528  hours to Dr. Murray Hodgkins, who verbally acknowledged these results.  **END ADDENDUM** SIGNED BY: Charline Bills, M.D.   10/15/2011  *RADIOLOGY REPORT*  Clinical Data: Follow-up diverticulitis with small contained perforation, lower abdominal cramping x3 weeks  CT ABDOMEN AND PELVIS WITH CONTRAST  Technique:  Multidetector CT imaging of the abdomen and pelvis was performed following the standard protocol during bolus administration of intravenous contrast.  Contrast: 100 ml Omnipaque-300 IV  Comparison: 09/16/2010  Findings: Paraseptal emphysematous changes in the right lower lobe.  Moderate hiatal hernia.  Mildly nodular hepatic contour (series 3/image 21), raising the possibility of cirrhosis.  No focal hepatic lesion.  Spleen, pancreas, and adrenal glands are within normal limits.  Gallbladder is underdistended.  No intrahepatic or extrahepatic ductal dilatation.  Kidneys are within normal limits.  No hydronephrosis.  No evidence of bowel obstruction.  Normal appendix.  Sigmoid diverticulitis with associated 3.9 x 4.6 x 4.7 cm diverticular abscess (series 3/image 57).  Extensive atherosclerotic calcifications of the abdominal aorta and branch vessels.  No abdominopelvic ascites.  No suspicious abdominopelvic lymphadenopathy.  Status post hysterectomy.  No adnexal masses.  Bladder is within normal limits.  Degenerative changes of the visualized thoracolumbar spine.  IMPRESSION: Sigmoid diverticulitis with associated 4.7 cm diverticular abscess.  Additional stable ancillary findings as above. Original Report Authenticated By: Charline Bills, M.D.      Assessment/Plan:  Outpatient Encounter Prescriptions as of 10/15/2011  Medication Sig Dispense Refill  . acetaminophen (MAPAP) 325 MG tablet Take 650 mg by mouth See admin instructions. Take 2 tablets every morning scheduled. Take 2 tablets every 6 hours as needed for pain.      Marland Kitchen acyclovir ointment (ZOVIRAX) 5 % Apply 1 application topically as needed.  For cold sores. Resident may self-administer.      Marland Kitchen amoxicillin (AMOXIL) 500 MG capsule 4 capsules prior to dental work       . atorvastatin (LIPITOR) 10 MG tablet Take 10 mg by mouth daily.      . cetirizine (ZYRTEC) 10 MG tablet Take 10 mg by mouth at bedtime.      . chlorhexidine (PERIDEX) 0.12 % solution Use as directed 15 mLs in the mouth or throat 2 (two) times daily. Swish by mouth for 30 seconds, then spit twice daily. May self-administer.      . cholecalciferol (VITAMIN D) 1000 UNITS tablet Take 2,000 Units by mouth daily.      Marland Kitchen donepezil (ARICEPT) 10 MG tablet Take 10 mg by mouth at bedtime.      Marland Kitchen escitalopram (LEXAPRO) 20 MG tablet Take 20 mg by mouth daily.      Marland Kitchen estrogens, conjugated, (PREMARIN) 0.3 MG tablet Take 0.3 mg by mouth daily.       . fluticasone (FLONASE) 50 MCG/ACT nasal spray Place 2 sprays into the nose daily as needed.      . folic acid (FOLVITE) 1 MG tablet Take 1 mg by mouth daily.      Marland Kitchen gabapentin (NEURONTIN) 100 MG capsule Take 200 mg by mouth at bedtime.      Marland Kitchen HYDROcodone-acetaminophen (NORCO) 5-325 MG per tablet Take 1 tablet by mouth every 6 (six) hours as needed. Pain.      . hydrocortisone-pramoxine (ANALPRAM-HC) 2.5-1 % rectal cream Place 1 application rectally 2 (two) times daily as needed. Inflammation. May self-administer.      Marland Kitchen ipratropium-albuterol (DUONEB) 0.5-2.5 (3) MG/3ML SOLN Take 3 mLs by nebulization every 6 (six) hours as needed. Wheezing and shortness of breath.      . levothyroxine (SYNTHROID, LEVOTHROID) 88 MCG tablet Take 88 mcg by mouth daily. May keep at bedside. (Place with bedtime meds for resident to self-administer early morning.)      . lubiprostone (AMITIZA) 8 MCG capsule Take 8 mcg by mouth at bedtime.      . meloxicam (MOBIC) 7.5 MG tablet Take 7.5 mg by mouth daily.      . mirtazapine (REMERON) 15 MG tablet Take 15 mg by mouth at bedtime.      . Multiple Vitamins-Minerals (EYE VITAMINS) TABS Take 1 tablet by mouth 2 (two)  times daily.      . nisoldipine (SULAR) 17 MG 24 hr tablet Take 17 mg by mouth daily.      Marland Kitchen olopatadine (PATANOL) 0.1 % ophthalmic solution Place into both eyes 2 (two) times daily as needed. Allergy irritation. May self-administer.      Marland Kitchen omeprazole (PRILOSEC) 40 MG capsule Take 40 mg by mouth daily.      Bertram Gala Glycol-Propyl Glycol (SYSTANE ULTRA) 0.4-0.3 % SOLN Apply 1 drop to eye as needed. Dryness itching.      . polyethylene glycol (MIRALAX / GLYCOLAX) packet Take 17 g by mouth daily.      Marland Kitchen senna (SENOKOT) 8.6 MG TABS Take 2 tablets by mouth daily as needed.      . tiotropium (SPIRIVA) 18 MCG inhalation capsule Place 18 mcg into inhaler  and inhale daily. May self administer.      . warfarin (COUMADIN) 5 MG tablet Take 5 mg by mouth daily.      Marland Kitchen DISCONTD: acetaminophen (TYLENOL) 325 MG tablet 2 every am as needed      . DISCONTD: atorvastatin (LIPITOR) 10 MG tablet Take 10 mg by mouth daily.        Marland Kitchen DISCONTD: cetirizine (ZYRTEC) 10 MG tablet Take 10 mg by mouth at bedtime.        Marland Kitchen DISCONTD: chlorhexidine (PERIDEX) 0.12 % solution Use as directed 15 mLs in the mouth or throat daily.        Marland Kitchen DISCONTD: Cholecalciferol (VITAMIN D) 2000 UNITS CAPS Take 1 capsule by mouth daily.        Marland Kitchen DISCONTD: donepezil (ARICEPT) 10 MG tablet Take 10 mg by mouth at bedtime.       Marland Kitchen DISCONTD: escitalopram (LEXAPRO) 20 MG tablet Take 20 mg by mouth daily.        Marland Kitchen DISCONTD: estrogens, conjugated, (PREMARIN) 0.3 MG tablet Take 0.3 mg by mouth daily. Take daily for 21 days then do not take for 7 days.       Marland Kitchen DISCONTD: folic acid (FOLVITE) 1 MG tablet Take 1 mg by mouth daily.      Marland Kitchen DISCONTD: gabapentin (NEURONTIN) 100 MG capsule 2 at bedtime       . DISCONTD: hydrocortisone-pramoxine (ANALPRAM-HC) 2.5-1 % rectal cream Place 1 application rectally 3 (three) times daily as needed.        Marland Kitchen DISCONTD: ipratropium-albuterol (DUONEB) 0.5-2.5 (3) MG/3ML SOLN Take 3 mLs by nebulization every 6 (six) hours as  needed.        Marland Kitchen DISCONTD: levothyroxine (SYNTHROID, LEVOTHROID) 88 MCG tablet Take 88 mcg by mouth daily.        Marland Kitchen DISCONTD: lubiprostone (AMITIZA) 8 MCG capsule Take 8 mcg by mouth at bedtime.        Marland Kitchen DISCONTD: meloxicam (MOBIC) 7.5 MG tablet Take 7.5 mg by mouth daily.        Marland Kitchen DISCONTD: mirtazapine (REMERON) 15 MG tablet Take 15 mg by mouth at bedtime.        Marland Kitchen DISCONTD: Multiple Vitamins-Minerals (MACULAR VITAMIN BENEFIT) TABS Take 1 tablet by mouth daily.        Marland Kitchen DISCONTD: nisoldipine (SULAR) 8.5 MG 24 hr tablet Take 17 mg by mouth daily.       Marland Kitchen DISCONTD: olopatadine (PATANOL) 0.1 % ophthalmic solution Place 1 drop into both eyes 2 (two) times daily as needed.        Marland Kitchen DISCONTD: Polyethyl Glycol-Propyl Glycol (SYSTANE) 0.4-0.3 % SOLN Apply to eye as needed.      Marland Kitchen DISCONTD: polyethylene glycol (MIRALAX / GLYCOLAX) packet Take 17 g by mouth daily.        Marland Kitchen DISCONTD: tiotropium (SPIRIVA) 18 MCG inhalation capsule Place 18 mcg into inhaler and inhale daily.        Marland Kitchen DISCONTD: warfarin (COUMADIN) 5 MG tablet Take 5 mg by mouth daily.         Facility-Administered Encounter Medications as of 10/15/2011  Medication Dose Route Frequency Provider Last Rate Last Dose  . iohexol (OMNIPAQUE) 300 MG/ML solution 100 mL  100 mL Intravenous Once PRN Medication Radiologist, MD   100 mL at 10/15/11 1454  . iohexol (OMNIPAQUE) 300 MG/ML solution 30 mL  30 mL Oral Once PRN Medication Radiologist, MD   30 mL at 10/15/11 1454    Taleshia Luff Pager:  [phone]  10/15/2011, 3:22 PM

## 2011-10-16 ENCOUNTER — Inpatient Hospital Stay (HOSPITAL_COMMUNITY): Payer: Medicare Other

## 2011-10-16 ENCOUNTER — Encounter (HOSPITAL_COMMUNITY): Payer: Self-pay | Admitting: Radiology

## 2011-10-16 DIAGNOSIS — D649 Anemia, unspecified: Secondary | ICD-10-CM

## 2011-10-16 DIAGNOSIS — D509 Iron deficiency anemia, unspecified: Secondary | ICD-10-CM

## 2011-10-16 HISTORY — DX: Iron deficiency anemia, unspecified: D50.9

## 2011-10-16 LAB — IRON AND TIBC
Iron: 16 ug/dL — ABNORMAL LOW (ref 42–135)
TIBC: 394 ug/dL (ref 250–470)

## 2011-10-16 LAB — PROTIME-INR
INR: 1.6 — ABNORMAL HIGH (ref 0.00–1.49)
Prothrombin Time: 19.3 seconds — ABNORMAL HIGH (ref 11.6–15.2)

## 2011-10-16 LAB — CBC
Hemoglobin: 8.3 g/dL — ABNORMAL LOW (ref 12.0–15.0)
Platelets: 279 10*3/uL (ref 150–400)
RBC: 3.46 MIL/uL — ABNORMAL LOW (ref 3.87–5.11)
WBC: 10.2 10*3/uL (ref 4.0–10.5)

## 2011-10-16 LAB — FERRITIN: Ferritin: 18 ng/mL (ref 10–291)

## 2011-10-16 LAB — BASIC METABOLIC PANEL
GFR calc Af Amer: 62 mL/min — ABNORMAL LOW (ref >90)
GFR calc non Af Amer: 54 mL/min — ABNORMAL LOW (ref >90)
Potassium: 4 mEq/L (ref 3.5–5.1)
Sodium: 137 mEq/L (ref 135–145)

## 2011-10-16 MED ORDER — FENTANYL CITRATE 0.05 MG/ML IJ SOLN
INTRAMUSCULAR | Status: AC | PRN
  Administered 2011-10-16 (×2): 25 ug via INTRAVENOUS

## 2011-10-16 MED ORDER — FENTANYL CITRATE 0.05 MG/ML IJ SOLN
INTRAMUSCULAR | Status: AC
  Filled 2011-10-16: qty 4

## 2011-10-16 MED ORDER — MIDAZOLAM HCL 2 MG/2ML IJ SOLN
INTRAMUSCULAR | Status: AC
  Filled 2011-10-16: qty 4

## 2011-10-16 MED ORDER — MIDAZOLAM HCL 5 MG/5ML IJ SOLN
INTRAMUSCULAR | Status: AC | PRN
  Administered 2011-10-16: 1 mg via INTRAVENOUS
  Administered 2011-10-16: 0.5 mg via INTRAVENOUS

## 2011-10-16 NOTE — Care Management Note (Addendum)
    Page 1 of 2   10/24/2011     10:25:33 AM   CARE MANAGEMENT NOTE 10/24/2011  Patient:  [name], [name]   Account Number:  [id number]  Date Initiated:  10/16/2011  Documentation initiated by:  Letha Cape  Subjective/Objective Assessment:   dx intestinal diverticular abscess, chronic hypoxic resp failure  admit- from ALF- [facility].     Action/Plan:   pt eval- rec st snf   Anticipated DC Date:  10/24/2011   Anticipated DC Plan:  SKILLED NURSING FACILITY  In-house referral  Clinical Social Worker      DC Planning Services  CM consult      Choice offered to / List presented to:             Status of service:  Completed, signed off Medicare Important Message given?   (If response is "NO", the following Medicare IM given date fields will be blank) Date Medicare IM given:   Date Additional Medicare IM given:    Discharge Disposition:  SKILLED NURSING FACILITY  Per UR Regulation:  Reviewed for med. necessity/level of care/duration of stay  If discussed at Long Length of Stay Meetings, dates discussed:   10/23/2011    Comments:  10/24/11 10:23 Letha Cape RN, BSN [phone] patient for dc to SNF at [facility] today, CSW following.  10/23/11 11:40 Letha Cape RN, BSN [phone] pateint had drain dc'd today, patient for dc back to ALF under snf level  tomorrow  10/17/11 14:50 Letha Cape RN, BSN [phone] awaiting pt eval , pt s/p jp drain on 7/23.  Plan is for patient to return back to ALF depending on level of care needed at dc.  10/16/11 16:10 Letha Cape RN, BSN [phone] patient is from ALF [facility], will need pt eval.  CSW referral.

## 2011-10-16 NOTE — Progress Notes (Signed)
Subjective: Lying flat in bed resting comfortably. States that she just had a BM. Less pain this morning.  Objective: Vital signs in last 24 hours: Temp:  [98.7 F (37.1 C)-100 F (37.8 C)] 98.7 F (37.1 C) (07/23 0521) Pulse Rate:  [63-87] 66  (07/23 0521) Resp:  [18-20] 18  (07/23 0521) BP: (109-142)/(60-86) 109/60 mmHg (07/23 0521) SpO2:  [89 %-95 %] 95 % (07/23 0521) Weight:  [157 lb 9.6 oz (71.487 kg)-160 lb 11.2 oz (72.893 kg)] 160 lb 11.2 oz (72.893 kg) (07/22 1643) Last BM Date: 10/14/11  Intake/Output from previous day: 07/22 0701 - 07/23 0700 In: 170 [I.V.:170] Out: -  Intake/Output this shift:    General appearance: alert, cooperative, appears stated age, no distress and mildly obese Chest: decreased BS bilaterally w/o ronchi or wheeze. Abdomen: soft, non tender, + BS, no bloating. Extremities: no edema Vital signs are stable, afebrile.  Lab Results:   Basename 10/16/11 0507 10/15/11 1648  WBC 10.2 11.2*  HGB 8.3* 9.2*  HCT 27.5* 30.6*  PLT 279 270   BMET  Basename 10/16/11 0507 10/15/11 1648  NA 137 132*  K 4.0 4.4  CL 101 95*  CO2 25 26  GLUCOSE 105* 97  BUN 9 11  CREATININE 0.91 0.91  CALCIUM 8.6 9.1   PT/INR  Basename 10/16/11 0507 10/15/11 1648  LABPROT 19.3* 18.6*  INR 1.60* 1.52*   ABG No results found for this basename: PHART:2,PCO2:2,PO2:2,HCO3:2 in the last 72 hours  Studies/Results: Ct Abdomen Pelvis W Contrast  10/15/2011  **ADDENDUM** CREATED: 10/15/2011 15:27:49  These results were called by telephone on 10/15/2011 at 1528 hours to Dr. Murray Hodgkins, who verbally acknowledged these results.  **END ADDENDUM** SIGNED BY: Charline Bills, M.D.   10/15/2011  *RADIOLOGY REPORT*  Clinical Data: Follow-up diverticulitis with small contained perforation, lower abdominal cramping x3 weeks  CT ABDOMEN AND PELVIS WITH CONTRAST  Technique:  Multidetector CT imaging of the abdomen and pelvis was performed following the standard protocol  during bolus administration of intravenous contrast.  Contrast: 100 ml Omnipaque-300 IV  Comparison: 09/16/2010  Findings: Paraseptal emphysematous changes in the right lower lobe.  Moderate hiatal hernia.  Mildly nodular hepatic contour (series 3/image 21), raising the possibility of cirrhosis.  No focal hepatic lesion.  Spleen, pancreas, and adrenal glands are within normal limits.  Gallbladder is underdistended.  No intrahepatic or extrahepatic ductal dilatation.  Kidneys are within normal limits.  No hydronephrosis.  No evidence of bowel obstruction.  Normal appendix.  Sigmoid diverticulitis with associated 3.9 x 4.6 x 4.7 cm diverticular abscess (series 3/image 57).  Extensive atherosclerotic calcifications of the abdominal aorta and branch vessels.  No abdominopelvic ascites.  No suspicious abdominopelvic lymphadenopathy.  Status post hysterectomy.  No adnexal masses.  Bladder is within normal limits.  Degenerative changes of the visualized thoracolumbar spine.  IMPRESSION: Sigmoid diverticulitis with associated 4.7 cm diverticular abscess.  Additional stable ancillary findings as above. Original Report Authenticated By: Charline Bills, M.D.    Anti-infectives: Anti-infectives     Start     Dose/Rate Route Frequency Ordered Stop   10/15/11 1800   ciprofloxacin (CIPRO) IVPB 400 mg        400 mg 200 mL/hr over 60 Minutes Intravenous Every 12 hours 10/15/11 1639     10/15/11 1800   metroNIDAZOLE (FLAGYL) IVPB 500 mg        500 mg 100 mL/hr over 60 Minutes Intravenous Every 8 hours 10/15/11 1639  Assessment/Plan: Sigmoid diverticulitis with diverticular abcess s/p * No surgery found * Plan: 1. Keep NPO for IR procedure 2. Continue ABX 3. Medical management per medicine team for her other medical issues. 4. We will continue to follow.  LOS: 1 day    [name] 10/16/2011

## 2011-10-16 NOTE — Interval H&P Note (Cosign Needed)
History and Physical Interval Note:  10/16/2011 10:09 AM  [name]  has been admitted with diverticulitis and associated abscess. We have been consulted to place percutaneous drain.  The various methods of treatment have been discussed with the patient and family. After consideration of risks, benefits and other options for treatment, the patient has consented to CT guided percutaneous abscess drainage .  The patient's history has been reviewed, patient examined, no change in status, stable for surgery.  I have reviewed the patient's chart and labs.  Questions were answered to the patient's satisfaction.   BP 109/60  Pulse 66  Temp 98.7 F (37.1 C) (Oral)  Resp 18  Ht 5\' 4"  (1.626 m)  Wt 160 lb 11.2 oz (72.893 kg)  BMI 27.58 kg/m2  SpO2 95% Lungs: CTA Heart: Reg Abdomen: soft, ND, mildly tender low abdomen.  Results for orders placed during the hospital encounter of 10/15/11  CBC WITH DIFFERENTIAL      Component Value Range   WBC 11.2 (*) 4.0 - 10.5 K/uL   RBC 3.85 (*) 3.87 - 5.11 MIL/uL   Hemoglobin 9.2 (*) 12.0 - 15.0 g/dL   HCT 16.1 (*) 09.6 - 04.5 %   MCV 79.5  78.0 - 100.0 fL   MCH 23.9 (*) 26.0 - 34.0 pg   MCHC 30.1  30.0 - 36.0 g/dL   RDW 40.9 (*) 81.1 - 91.4 %   Platelets 270  150 - 400 K/uL   Neutrophils Relative 70  43 - 77 %   Neutro Abs 7.8 (*) 1.7 - 7.7 K/uL   Lymphocytes Relative 17  12 - 46 %   Lymphs Abs 1.9  0.7 - 4.0 K/uL   Monocytes Relative 11  3 - 12 %   Monocytes Absolute 1.2 (*) 0.1 - 1.0 K/uL   Eosinophils Relative 2  0 - 5 %   Eosinophils Absolute 0.2  0.0 - 0.7 K/uL   Basophils Relative 0  0 - 1 %   Basophils Absolute 0.0  0.0 - 0.1 K/uL  COMPREHENSIVE METABOLIC PANEL      Component Value Range   Sodium 132 (*) 135 - 145 mEq/L   Potassium 4.4  3.5 - 5.1 mEq/L   Chloride 95 (*) 96 - 112 mEq/L   CO2 26  19 - 32 mEq/L   Glucose, Bld 97  70 - 99 mg/dL   BUN 11  6 - 23 mg/dL   Creatinine, Ser 7.82  0.50 - 1.10 mg/dL   Calcium 9.1  8.4 -  95.6 mg/dL   Total Protein 8.3  6.0 - 8.3 g/dL   Albumin 3.2 (*) 3.5 - 5.2 g/dL   AST 24  0 - 37 U/L   ALT 9  0 - 35 U/L   Alkaline Phosphatase 76  39 - 117 U/L   Total Bilirubin 0.4  0.3 - 1.2 mg/dL   GFR calc non Af Amer 54 (*) >90 mL/min   GFR calc Af Amer 62 (*) >90 mL/min  PROTIME-INR      Component Value Range   Prothrombin Time 18.6 (*) 11.6 - 15.2 seconds   INR 1.52 (*) 0.00 - 1.49  PROTIME-INR      Component Value Range   Prothrombin Time 19.3 (*) 11.6 - 15.2 seconds   INR 1.60 (*) 0.00 - 1.49  BASIC METABOLIC PANEL      Component Value Range   Sodium 137  135 - 145 mEq/L   Potassium 4.0  3.5 - 5.1  mEq/L   Chloride 101  96 - 112 mEq/L   CO2 25  19 - 32 mEq/L   Glucose, Bld 105 (*) 70 - 99 mg/dL   BUN 9  6 - 23 mg/dL   Creatinine, Ser 4.13  0.50 - 1.10 mg/dL   Calcium 8.6  8.4 - 24.4 mg/dL   GFR calc non Af Amer 54 (*) >90 mL/min   GFR calc Af Amer 62 (*) >90 mL/min  CBC      Component Value Range   WBC 10.2  4.0 - 10.5 K/uL   RBC 3.46 (*) 3.87 - 5.11 MIL/uL   Hemoglobin 8.3 (*) 12.0 - 15.0 g/dL   HCT 01.0 (*) 27.2 - 53.6 %   MCV 79.5  78.0 - 100.0 fL   MCH 24.0 (*) 26.0 - 34.0 pg   MCHC 30.2  30.0 - 36.0 g/dL   RDW 64.4 (*) 03.4 - 74.2 %   Platelets 279  150 - 400 K/uL    Seen for Dr. Idelle Crouch, [facility] PA-C

## 2011-10-16 NOTE — Progress Notes (Signed)
Clinical Social Worker attempted to meet with pt as pt admitted from [facility]. Pt sleeping at this time. Per chart, plan for IR drain placement today. Unclear at this time if pt will return to [facility] or need rehab at [facility]. Clinical Social Worker to continue to follow.  Jacklynn Lewis, MSW, LCSWA  Clinical Social Work [phone]

## 2011-10-16 NOTE — Progress Notes (Signed)
INITIAL ADULT NUTRITION ASSESSMENT Date: 10/16/2011   Time: 10:04 AM Reason for Assessment: Nutrition Risk   INTERVENTION: 1. Diet advance post drain as medically able  2. RD will continue to follow    ASSESSMENT: Female 78 y.o.  Dx: Intestinal diverticular abscess  Hx:  Past Medical History  Diagnosis Date  . Cholelithiases   . CHF (congestive heart failure)   . Hypothyroid   . CAD (coronary artery disease)   . Leg paresthesia   . Aortic valve stenosis   . Arthritis   . Hyperlipidemia   . Dementia   . Hypoxemia   . Carotid artery occlusion   . Renal disorder   . Hypertension   . COPD (chronic obstructive pulmonary disease)   . Hyperlipemia   . SEIZURE DISORDER 11/05/2006  . GERD 11/05/2006  . CVA 02/07/2007  . CONGESTIVE HEART FAILURE 09/13/2006  . HYPOTHYROIDISM 09/13/2006  . Macular degeneration of both eyes   . Blind in both eyes   . Peripheral neuropathy   . Heart murmur     "a bad one!!!!!!"  . Pneumonia     "many times"  . History of bronchitis     "get it q once in awhile"  . Exertional dyspnea   . Internal hemorrhoid, bleeding   . Malaria     "as a child"  . Skin cancer     "nose, face mostly"  . UTI (lower urinary tract infection) 10/15/11  . Chronic lower back pain     "when I go to the bathroom"    Related Meds:     . antiseptic oral rinse  15 mL Mouth Rinse BID  . ciprofloxacin  400 mg Intravenous Q12H  . levothyroxine  44 mcg Intravenous Daily  . metronidazole  500 mg Intravenous Q8H  . olopatadine  1 drop Both Eyes BID  . pantoprazole (PROTONIX) IV  40 mg Intravenous Q24H  . tiotropium  18 mcg Inhalation Daily     Ht: 5\' 4"  (162.6 cm)  Wt: 160 lb 11.2 oz (72.893 kg)  Ideal Wt: 54.5 kg  % Ideal Wt: 134%  Usual Wt:  Wt Readings from Last 10 Encounters:  10/15/11 160 lb 11.2 oz (72.893 kg)  10/15/11 157 lb 9.6 oz (71.487 kg)  09/21/11 165 lb 12.6 oz (75.2 kg)  07/03/11 170 lb 9.6 oz (77.384 kg)  03/29/11 174 lb (78.926 kg)    01/09/11 173 lb 9.6 oz (78.744 kg)  04/24/10 178 lb 4 oz (80.854 kg)  04/18/10 176 lb 8 oz (80.06 kg)  02/10/10 177 lb 6.4 oz (80.468 kg)  01/19/10 175 lb (79.379 kg)  ~174 lbs per weight hx   % Usual Wt: 92%  Body mass index is 27.58 kg/(m^2). Pt is overweight per BMI   Food/Nutrition Related Hx: poor intake while at [facility] with weight loss  Labs:  CMP     Component Value Date/Time   NA 137 10/16/2011 0507   K 4.0 10/16/2011 0507   CL 101 10/16/2011 0507   CO2 25 10/16/2011 0507   GLUCOSE 105* 10/16/2011 0507   GLUCOSE 92 02/05/2006 1419   BUN 9 10/16/2011 0507   CREATININE 0.91 10/16/2011 0507   CALCIUM 8.6 10/16/2011 0507   PROT 8.3 10/15/2011 1648   ALBUMIN 3.2* 10/15/2011 1648   AST 24 10/15/2011 1648   ALT 9 10/15/2011 1648   ALKPHOS 76 10/15/2011 1648   BILITOT 0.4 10/15/2011 1648   GFRNONAA 54* 10/16/2011 0507   GFRAA 62* 10/16/2011 0507  Intake/Output Summary (Last 24 hours) at 10/16/11 1006 Last data filed at 10/15/11 2041  Gross per 24 hour  Intake    170 ml  Output      0 ml  Net    170 ml     Diet Order: NPO  Supplements/Tube Feeding: none  IVF:    sodium chloride Last Rate: 50 mL/hr at 10/15/11 2044    Estimated Nutritional Needs:   Kcal: 1400-1600  Protein: 70-80 gm  Fluid: 1.5-1.6 L  Pt admitted for drain placement for diverticular abscess. Pt reports weight loss. Per weight hx, pt with 14 lbs (8%) weight loss in 7 months, not significant weight loss in the given time frame.  Pt states that she was eating well while she was at home, but had lost weight while she was at [facility] last month. Appetite is good. Denies any problems with chewing or swallowing. Does not want any nutrition supplements once diet is advanced post op.   NUTRITION DIAGNOSIS: -Inadequate oral intake (NI-2.1).  Status: Ongoing  RELATED TO: inability to eat  AS EVIDENCE BY: NPO diet status   MONITORING/EVALUATION(Goals): Goal: Diet advance post drain placement  Monitor: diet  advance, weight, labs   EDUCATION NEEDS: -No education needs identified at this time    DOCUMENTATION CODES Per approved criteria  -Not Applicable    Clarene Duke RD, LDN Pager [phone] After Hours pager [phone]

## 2011-10-16 NOTE — Progress Notes (Signed)
For perc drain today.  Questions answered.

## 2011-10-16 NOTE — H&P (Signed)
CC: Abdominal pain.  History of Present Illness: [name] is a 78 y.o. female hospitalized from 09/15/11 to 09/21/11 with diverticulitis.  She had persistent abdominal pain and constipation.  CT abd/pelvis 10/15/11 showed diverticular abscess.  [name] is followed in the pulmonary office for COPD, chronic hypoxic respiratory failure, and lung nodule.    Subjective: Breathing ok.  Not much cough.  Denies chest pain.  No change in abdominal discomfort.  Physical Exam: Filed Vitals:   10/15/11 1643 10/15/11 2127 10/16/11 0521  BP: 128/69 132/73 109/60  Pulse: 77 63 66  Temp: 100 F (37.8 C) 99.5 F (37.5 C) 98.7 F (37.1 C)  TempSrc: Oral Oral Oral  Resp: 18 20 18   Height: 5\' 4"  (1.626 m)    Weight: 160 lb 11.2 oz (72.893 kg)    SpO2: 90% 95% 95%    Wt Readings from Last 3 Encounters:  10/15/11 160 lb 11.2 oz (72.893 kg)  10/15/11 157 lb 9.6 oz (71.487 kg)  09/21/11 165 lb 12.6 oz (75.2 kg)    Body mass index is 27.58 kg/(m^2).   General - Appears uncomfortable ENT - no sinus tenderness Cardiac - s1s2 regular, 3/6 SM Chest - prolonged exhalation, no wheeze/rales Abd - soft, mild tenderness in lower quadrants b/l, no rebound/guarding, + bowel sounds Ext - no edema Neuro - normal strength Skin - no rash Psych - normal mood, and behavior.   Ct Abdomen Pelvis W Contrast  10/15/2011  **ADDENDUM** CREATED: 10/15/2011 15:27:49  These results were called by telephone on 10/15/2011 at 1528 hours to Dr. Murray Hodgkins, who verbally acknowledged these results.  **END ADDENDUM** SIGNED BY: Charline Bills, M.D.   10/15/2011  *RADIOLOGY REPORT*  Clinical Data: Follow-up diverticulitis with small contained perforation, lower abdominal cramping x3 weeks  CT ABDOMEN AND PELVIS WITH CONTRAST  Technique:  Multidetector CT imaging of the abdomen and pelvis was performed following the standard protocol during bolus administration of intravenous contrast.  Contrast: 100 ml  Omnipaque-300 IV  Comparison: 09/16/2010  Findings: Paraseptal emphysematous changes in the right lower lobe.  Moderate hiatal hernia.  Mildly nodular hepatic contour (series 3/image 21), raising the possibility of cirrhosis.  No focal hepatic lesion.  Spleen, pancreas, and adrenal glands are within normal limits.  Gallbladder is underdistended.  No intrahepatic or extrahepatic ductal dilatation.  Kidneys are within normal limits.  No hydronephrosis.  No evidence of bowel obstruction.  Normal appendix.  Sigmoid diverticulitis with associated 3.9 x 4.6 x 4.7 cm diverticular abscess (series 3/image 57).  Extensive atherosclerotic calcifications of the abdominal aorta and branch vessels.  No abdominopelvic ascites.  No suspicious abdominopelvic lymphadenopathy.  Status post hysterectomy.  No adnexal masses.  Bladder is within normal limits.  Degenerative changes of the visualized thoracolumbar spine.  IMPRESSION: Sigmoid diverticulitis with associated 4.7 cm diverticular abscess.  Additional stable ancillary findings as above. Original Report Authenticated By: Charline Bills, M.D.    Lab Results  Component Value Date   WBC 10.2 10/16/2011   HGB 8.3* 10/16/2011   HCT 27.5* 10/16/2011   MCV 79.5 10/16/2011   PLT 279 10/16/2011    Lab Results  Component Value Date   CREATININE 0.91 10/16/2011   BUN 9 10/16/2011   NA 137 10/16/2011   K 4.0 10/16/2011   CL 101 10/16/2011   CO2 25 10/16/2011    Lab Results  Component Value Date   ALT 9 10/15/2011   AST 24 10/15/2011   ALKPHOS 76 10/15/2011   BILITOT 0.4  10/15/2011    Assessment/Plan:  A: Diverticular abscess P: NPO for now Consulted IR and general surgery>>for IR drain 7/23 D2x Cipro, Flagyl IV fluids PRN low dose morphine for pain control   A: Hx of COPD P: Continue spiriva PRN nebulizer therapy  A: Chronic hypoxic respiratory failure P: Supplemental oxygen to keep SpO2 > 92%  A: Hx of HTN, CHF, CAD, Aortic stenosis P: Hold coumadin in  anticipation that she may need procedure for her diverticular abscess F/u INR>>may need to start heparin gtt as transition  A: Chronic kidney disease P: F/u renal fx, monitor urine outpt  A: Hypothyroidism P: Synthroid IV until able to take po  A: Anemia P: Check anemia panel F/u CBC Transfuse for Hb < 7   Coralyn Helling, MD [name]/Critical Care/Sleep Pager:  [phone] 10/16/2011, 10:13 AM

## 2011-10-16 NOTE — Procedures (Signed)
Interventional Radiology Procedure Note  Procedure: CT guided placement of 66F drain into diverticular abscess.  Sample of fluid sent for culture. Complications: No immediate Recommendations: - Flush Q shift with 5 mL saline - Maintain to bulb suction - Monitor drain output - Follow culture results, adjust antibiotics if indicated  Signed,  Sterling Big, MD Vascular & Interventional Radiologist [facility] Radiology

## 2011-10-17 LAB — CBC
MCH: 23.7 pg — ABNORMAL LOW (ref 26.0–34.0)
MCHC: 30 g/dL (ref 30.0–36.0)
MCV: 78.9 fL (ref 78.0–100.0)
Platelets: 320 10*3/uL (ref 150–400)
RDW: 18.2 % — ABNORMAL HIGH (ref 11.5–15.5)
WBC: 17.2 10*3/uL — ABNORMAL HIGH (ref 4.0–10.5)

## 2011-10-17 LAB — BASIC METABOLIC PANEL
CO2: 25 mEq/L (ref 19–32)
Calcium: 8.9 mg/dL (ref 8.4–10.5)
Creatinine, Ser: 0.91 mg/dL (ref 0.50–1.10)
Glucose, Bld: 106 mg/dL — ABNORMAL HIGH (ref 70–99)

## 2011-10-17 MED ORDER — WARFARIN - PHYSICIAN DOSING INPATIENT: Freq: Every day | Status: DC

## 2011-10-17 MED ORDER — ATORVASTATIN CALCIUM 10 MG PO TABS
10.0000 mg | ORAL_TABLET | Freq: Every day | ORAL | Status: DC
  Administered 2011-10-17 – 2011-10-23 (×7): 10 mg via ORAL
  Filled 2011-10-17 (×9): qty 1

## 2011-10-17 MED ORDER — ZOLPIDEM TARTRATE 5 MG PO TABS
5.0000 mg | ORAL_TABLET | Freq: Every evening | ORAL | Status: DC | PRN
  Administered 2011-10-17 – 2011-10-23 (×7): 5 mg via ORAL
  Filled 2011-10-17 (×6): qty 1

## 2011-10-17 MED ORDER — PANTOPRAZOLE SODIUM 40 MG PO TBEC
40.0000 mg | DELAYED_RELEASE_TABLET | Freq: Every day | ORAL | Status: DC
  Administered 2011-10-17 – 2011-10-18 (×2): 40 mg via ORAL
  Filled 2011-10-17 (×3): qty 1

## 2011-10-17 MED ORDER — FERROUS SULFATE 325 (65 FE) MG PO TABS
325.0000 mg | ORAL_TABLET | Freq: Two times a day (BID) | ORAL | Status: DC
  Administered 2011-10-18 – 2011-10-24 (×2): 325 mg via ORAL
  Filled 2011-10-17 (×16): qty 1

## 2011-10-17 MED ORDER — AMOXICILLIN-POT CLAVULANATE 875-125 MG PO TABS
1.0000 | ORAL_TABLET | Freq: Two times a day (BID) | ORAL | Status: DC
  Administered 2011-10-17 (×2): 1 via ORAL
  Filled 2011-10-17 (×4): qty 1

## 2011-10-17 MED ORDER — VITAMIN C 250 MG PO TABS
250.0000 mg | ORAL_TABLET | Freq: Two times a day (BID) | ORAL | Status: DC
  Administered 2011-10-17 – 2011-10-24 (×15): 250 mg via ORAL
  Filled 2011-10-17 (×18): qty 1

## 2011-10-17 MED ORDER — ACETAMINOPHEN 325 MG PO TABS
650.0000 mg | ORAL_TABLET | Freq: Four times a day (QID) | ORAL | Status: DC | PRN
  Administered 2011-10-17: 650 mg via ORAL
  Filled 2011-10-17: qty 2

## 2011-10-17 MED ORDER — WARFARIN SODIUM 5 MG PO TABS
5.0000 mg | ORAL_TABLET | Freq: Every day | ORAL | Status: DC
  Administered 2011-10-17: 5 mg via ORAL
  Filled 2011-10-17 (×2): qty 1

## 2011-10-17 MED ORDER — LEVOTHYROXINE SODIUM 88 MCG PO TABS
88.0000 ug | ORAL_TABLET | Freq: Every day | ORAL | Status: DC
  Administered 2011-10-18 – 2011-10-24 (×7): 88 ug via ORAL
  Filled 2011-10-17 (×8): qty 1

## 2011-10-17 MED ORDER — MUPIROCIN 2 % EX OINT
TOPICAL_OINTMENT | Freq: Two times a day (BID) | CUTANEOUS | Status: DC
  Administered 2011-10-17 – 2011-10-24 (×8): via NASAL
  Filled 2011-10-17: qty 22

## 2011-10-17 MED ORDER — BACITRACIN-NEOMYCIN-POLYMYXIN OINTMENT TUBE
TOPICAL_OINTMENT | CUTANEOUS | Status: DC | PRN
  Filled 2011-10-17: qty 15

## 2011-10-17 NOTE — Progress Notes (Signed)
Seems better.  Advance diet.  Transition to po abx and plan on returning to [facility] with drain and outpatient follow up.

## 2011-10-17 NOTE — Progress Notes (Addendum)
Subjective: Pelvic drain placed 7/23 Output good Some better today   Objective: Vital signs in last 24 hours: Temp:  [98.6 F (37 C)-101.9 F (38.8 C)] 101.9 F (38.8 C) (07/24 0350) Pulse Rate:  [73-87] 81  (07/24 0915) Resp:  [16-25] 18  (07/24 0915) BP: (134-167)/(64-79) 134/72 mmHg (07/24 0350) SpO2:  [91 %-100 %] 99 % (07/24 0915) Last BM Date: 10/17/11  Intake/Output from previous day: 07/23 0701 - 07/24 0700 In: 5  Out: 40 [Drains:40] Intake/Output this shift:    PE:  101.9 VSS Wbc 17.2 (10.2) Output 40 cc 7/23; 10 cc in JP now Bloody; purulent fluid: cx - gr + cocci Site clean and dry; NT  Lab Results:   [facility] 10/17/11 0645 10/16/11 0507  WBC 17.2* 10.2  HGB 9.0* 8.3*  HCT 30.0* 27.5*  PLT 320 279   BMET  Basename 10/17/11 0645 10/16/11 0507  NA 133* 137  K 4.0 4.0  CL 96 101  CO2 25 25  GLUCOSE 106* 105*  BUN 7 9  CREATININE 0.91 0.91  CALCIUM 8.9 8.6   PT/INR  Basename 10/16/11 0507 10/15/11 1648  LABPROT 19.3* 18.6*  INR 1.60* 1.52*   ABG No results found for this basename: PHART:2,PCO2:2,PO2:2,HCO3:2 in the last 72 hours  Studies/Results: Ct Guided Abscess Drain  10/16/2011  *RADIOLOGY REPORT*  CT GUIDED ABSCESS DRAIN  Date: 10/16/2011  Clinical History: 78 year old female with perforated diverticulitis and now diverticular abscess.  She presents for percutaneous drain placement.  Procedures Performed: 1. CT guided placement of a 12-French drain  Interventional Radiologist:  Sterling Big, MD  Sedation: Moderate (conscious) sedation was used.  1.5 mg Versed, 50 mcg Fentanyl were administered intravenously.  The patient's vital signs were monitored continuously by radiology nursing throughout the procedure.  Sedation Time: 30 minutes  Fluoroscopy time:  8 seconds  PROCEDURE/FINDINGS:   Informed consent was obtained from the patient following explanation of the procedure, risks, benefits and alternatives. The patient understands,  agrees and consents for the procedure. All questions were addressed. A time out was performed.  Maximal barrier sterile technique utilized including caps, mask, sterile gowns, sterile gloves, large sterile drape, hand hygiene, and betadine skin prep.  A planning axial CT scan was performed.  The right lower quadrant diverticular abscess was identified.  A suitable skin entry site was selected and marked.  Local anesthesia of the skin underlying soft tissues was obtained by infiltration of 1% lidocaine.  Under direct CT guidance, an 18 gauge trocar needle was advanced to the skin, soft tissues, gluteal muscle and peritoneal lining into the diverticular abscess.  A small amount of frankly purulent material was aspirated.  A short stiff Amplatz wire was then advanced through the needle and coiled within the abscess cavity.  Serial dilatation of the tract was then performed and ultimately a 12 [place] Cook multipurpose drainage catheter was advanced over the wire and coiled within the abscess cavity.  Approximately 20 ml of foul-smelling material was then aspirated.  A final axial CT image was performed confirming successful placement of the pigtail catheter within the previously identified abscess collection. The drainage catheter was then connected to bulb suction and secured to the skin with Prolene suture and an Accustick adhesive device.  Aspirated material was sent for culture.  The patient tolerated the procedure well, there is no immediate complication.  The patient was returned to the floor in stable condition.  IMPRESSION:  Successful CT guided placement of a 12-French drain into the  pelvic diverticular abscess.  Aspirated material sent for Gram stain and culture.  Signed,  Sterling Big, MD Vascular & Interventional Radiologist [facility] Radiology  Original Report Authenticated By: Threasa Beards Abdomen Pelvis W Contrast  10/15/2011  **ADDENDUM** CREATED: 10/15/2011 15:27:49  These results were called by  telephone on 10/15/2011 at 1528 hours to Dr. Murray Hodgkins, who verbally acknowledged these results.  **END ADDENDUM** SIGNED BY: Charline Bills, M.D.   10/15/2011  *RADIOLOGY REPORT*  Clinical Data: Follow-up diverticulitis with small contained perforation, lower abdominal cramping x3 weeks  CT ABDOMEN AND PELVIS WITH CONTRAST  Technique:  Multidetector CT imaging of the abdomen and pelvis was performed following the standard protocol during bolus administration of intravenous contrast.  Contrast: 100 ml Omnipaque-300 IV  Comparison: 09/16/2010  Findings: Paraseptal emphysematous changes in the right lower lobe.  Moderate hiatal hernia.  Mildly nodular hepatic contour (series 3/image 21), raising the possibility of cirrhosis.  No focal hepatic lesion.  Spleen, pancreas, and adrenal glands are within normal limits.  Gallbladder is underdistended.  No intrahepatic or extrahepatic ductal dilatation.  Kidneys are within normal limits.  No hydronephrosis.  No evidence of bowel obstruction.  Normal appendix.  Sigmoid diverticulitis with associated 3.9 x 4.6 x 4.7 cm diverticular abscess (series 3/image 57).  Extensive atherosclerotic calcifications of the abdominal aorta and branch vessels.  No abdominopelvic ascites.  No suspicious abdominopelvic lymphadenopathy.  Status post hysterectomy.  No adnexal masses.  Bladder is within normal limits.  Degenerative changes of the visualized thoracolumbar spine.  IMPRESSION: Sigmoid diverticulitis with associated 4.7 cm diverticular abscess.  Additional stable ancillary findings as above. Original Report Authenticated By: Charline Bills, M.D.    Anti-infectives: Anti-infectives     Start     Dose/Rate Route Frequency Ordered Stop   10/15/11 1800   ciprofloxacin (CIPRO) IVPB 400 mg        400 mg 200 mL/hr over 60 Minutes Intravenous Every 12 hours 10/15/11 1639     10/15/11 1800   metroNIDAZOLE (FLAGYL) IVPB 500 mg        500 mg 100 mL/hr over 60 Minutes  Intravenous Every 8 hours 10/15/11 1639            Assessment/Plan: s/p * No surgery found *  Rt pelvic drain placed 7/23 Fells some better Output good Hi wbc; febrile Will follow  Aubriee Szeto A 10/17/2011

## 2011-10-17 NOTE — Progress Notes (Signed)
MD Sood notified of patient temp

## 2011-10-17 NOTE — Progress Notes (Signed)
CC: Abdominal pain.   History of Present Illness:  [name] is a 78 y.o. female hospitalized from 09/15/11 to 09/21/11 with diverticulitis. She had persistent abdominal pain and constipation. CT abd/pelvis 10/15/11 showed diverticular abscess and she was admitted to hospital.  Ms. [name] is followed in the pulmonary office for COPD, chronic hypoxic respiratory failure, and lung nodule.   Abx: Cipro 7/22>> Flagyl 7/22>>  Cx: Abdominal abscess 7/23>>  Events: 7/23 Abdominal pig-tail catheter inserted by IR  Subjective:  Slept better last night.  Tolerating diet.  Denies abd pain.  Had fever spike this AM.  Vitals: Filed Vitals:   10/16/11 1807 10/16/11 2011 10/17/11 0350 10/17/11 0915  BP: 150/77 147/79 134/72   Pulse: 86 87 82 81  Temp: 98.6 F (37 C) 99.6 F (37.6 C) 101.9 F (38.8 C)   TempSrc: Oral Oral Oral   Resp: 20 18 20 18   Height:      Weight:      SpO2: 91% 96% 94% 99%    Intake/Output Summary (Last 24 hours) at 10/17/11 1048 Last data filed at 10/17/11 0330  Gross per 24 hour  Intake      5 ml  Output     40 ml  Net    -35 ml    Physical Exam: General - Appears uncomfortable  ENT - no sinus tenderness  Cardiac - s1s2 regular, 3/6 SM  Chest - prolonged exhalation, no wheeze/rales  Abd - soft, non-tender, no rebound/guarding, + bowel sounds  Ext - no edema  Neuro - normal strength  Skin - no rash  Psych - normal mood, and behavior.  Ct Guided Abscess Drain  10/16/2011  *RADIOLOGY REPORT*  CT GUIDED ABSCESS DRAIN  Date: 10/16/2011  Clinical History: 78 year old female with perforated diverticulitis and now diverticular abscess.  She presents for percutaneous drain placement.  Procedures Performed: 1. CT guided placement of a 12-French drain  Interventional Radiologist:  Sterling Big, MD  Sedation: Moderate (conscious) sedation was used.  1.5 mg Versed, 50 mcg Fentanyl were administered intravenously.  The patient's vital signs were  monitored continuously by radiology nursing throughout the procedure.  Sedation Time: 30 minutes  Fluoroscopy time:  8 seconds  PROCEDURE/FINDINGS:   Informed consent was obtained from the patient following explanation of the procedure, risks, benefits and alternatives. The patient understands, agrees and consents for the procedure. All questions were addressed. A time out was performed.  Maximal barrier sterile technique utilized including caps, mask, sterile gowns, sterile gloves, large sterile drape, hand hygiene, and betadine skin prep.  A planning axial CT scan was performed.  The right lower quadrant diverticular abscess was identified.  A suitable skin entry site was selected and marked.  Local anesthesia of the skin underlying soft tissues was obtained by infiltration of 1% lidocaine.  Under direct CT guidance, an 18 gauge trocar needle was advanced to the skin, soft tissues, gluteal muscle and peritoneal lining into the diverticular abscess.  A small amount of frankly purulent material was aspirated.  A short stiff Amplatz wire was then advanced through the needle and coiled within the abscess cavity.  Serial dilatation of the tract was then performed and ultimately a 12 [place] Cook multipurpose drainage catheter was advanced over the wire and coiled within the abscess cavity.  Approximately 20 ml of foul-smelling material was then aspirated.  A final axial CT image was performed confirming successful placement of the pigtail catheter within the previously identified abscess collection. The drainage catheter was  then connected to bulb suction and secured to the skin with Prolene suture and an Accustick adhesive device.  Aspirated material was sent for culture.  The patient tolerated the procedure well, there is no immediate complication.  The patient was returned to the floor in stable condition.  IMPRESSION:  Successful CT guided placement of a 12-French drain into the pelvic diverticular abscess.   Aspirated material sent for Gram stain and culture.  Signed,  Sterling Big, MD Vascular & Interventional Radiologist [facility] Radiology  Original Report Authenticated By: Threasa Beards Abdomen Pelvis W Contrast  10/15/2011  **ADDENDUM** CREATED: 10/15/2011 15:27:49  These results were called by telephone on 10/15/2011 at 1528 hours to Dr. Murray Hodgkins, who verbally acknowledged these results.  **END ADDENDUM** SIGNED BY: Charline Bills, M.D.   10/15/2011  *RADIOLOGY REPORT*  Clinical Data: Follow-up diverticulitis with small contained perforation, lower abdominal cramping x3 weeks  CT ABDOMEN AND PELVIS WITH CONTRAST  Technique:  Multidetector CT imaging of the abdomen and pelvis was performed following the standard protocol during bolus administration of intravenous contrast.  Contrast: 100 ml Omnipaque-300 IV  Comparison: 09/16/2010  Findings: Paraseptal emphysematous changes in the right lower lobe.  Moderate hiatal hernia.  Mildly nodular hepatic contour (series 3/image 21), raising the possibility of cirrhosis.  No focal hepatic lesion.  Spleen, pancreas, and adrenal glands are within normal limits.  Gallbladder is underdistended.  No intrahepatic or extrahepatic ductal dilatation.  Kidneys are within normal limits.  No hydronephrosis.  No evidence of bowel obstruction.  Normal appendix.  Sigmoid diverticulitis with associated 3.9 x 4.6 x 4.7 cm diverticular abscess (series 3/image 57).  Extensive atherosclerotic calcifications of the abdominal aorta and branch vessels.  No abdominopelvic ascites.  No suspicious abdominopelvic lymphadenopathy.  Status post hysterectomy.  No adnexal masses.  Bladder is within normal limits.  Degenerative changes of the visualized thoracolumbar spine.  IMPRESSION: Sigmoid diverticulitis with associated 4.7 cm diverticular abscess.  Additional stable ancillary findings as above. Original Report Authenticated By: Charline Bills, M.D.   Lab Results  Component Value  Date   CREATININE 0.91 10/17/2011   BUN 7 10/17/2011   NA 133* 10/17/2011   K 4.0 10/17/2011   CL 96 10/17/2011   CO2 25 10/17/2011   Lab Results  Component Value Date   ALT 9 10/15/2011   AST 24 10/15/2011   ALKPHOS 76 10/15/2011   BILITOT 0.4 10/15/2011   Lab Results  Component Value Date   WBC 17.2* 10/17/2011   HGB 9.0* 10/17/2011   HCT 30.0* 10/17/2011   MCV 78.9 10/17/2011   PLT 320 10/17/2011   Assessment/Plan:  A: Diverticular abscess s/p drain insertion 7/23 by IR. P:  Full liquid diet Appreciate help from IR and general surgery D3/x Cipro, Flagyl  PRN low dose morphine for pain control  Tylenol prn for fever D/c IV fluids as she is tolerating diet  A: Hx of COPD  P:  Continue spiriva  PRN nebulizer therapy   A: Chronic hypoxic respiratory failure  P:  Supplemental oxygen to keep SpO2 > 92%   A: Hx of HTN, CHF, CAD, Aortic stenosis  P:  Resume coumadin 5 mg daily and adjust per INR Resume lipitor 10 mg daily  A: Chronic kidney disease  P:  F/u renal fx, monitor urine outpt   A: Hypothyroidism  P:  Change back to oral synthroid  A: Anemia 2nd to iron deficiency P:  Start FeSO4 with Vitamin C F/u CBC  Transfuse  for Hb < 7  Coralyn Helling, MD [facility] Pulmonary/Critical Care 10/17/2011, 10:51 AM Pager:  [phone] After 3pm call: [phone]

## 2011-10-17 NOTE — Progress Notes (Signed)
Patient ID: [name], female   DOB: Aug 30, 1919, 78 y.o.   MRN: [id number]    Subjective: Lying flat in bed resting comfortably. States that she feels good, slept well last pm. [place].  Objective: Vital signs in last 24 hours: Temp:  [98.6 F (37 C)-101.9 F (38.8 C)] 101.9 F (38.8 C) (07/24 0350) Pulse Rate:  [73-87] 82  (07/24 0350) Resp:  [16-25] 20  (07/24 0350) BP: (134-167)/(64-79) 134/72 mmHg (07/24 0350) SpO2:  [91 %-100 %] 94 % (07/24 0350) Last BM Date: 10/16/11  Intake/Output from previous day: 07/23 0701 - 07/24 0700 In: 5  Out: 40 [Drains:40] Intake/Output this shift:    General appearance: alert, cooperative, appears stated age, no distress and mildly obese Chest: decreased BS bilaterally w/o ronchi or wheeze. Abdomen: soft, non tender, + BS, no bloating, JP drain in place, draing small amount of old bloody appearing fluid, no bile or stool. Extremities: no edema Vital signs are stable, she ran a temp of 101.9 last pm (expected response to perc drain) WBC trended upward as well; probably stress response. (will recheck in am)  Lab Results:   [facility] 10/17/11 0645 10/16/11 0507  WBC 17.2* 10.2  HGB 9.0* 8.3*  HCT 30.0* 27.5*  PLT 320 279   BMET  Basename 10/17/11 0645 10/16/11 0507  NA 133* 137  K 4.0 4.0  CL 96 101  CO2 25 25  GLUCOSE 106* 105*  BUN 7 9  CREATININE 0.91 0.91  CALCIUM 8.9 8.6   PT/INR  Basename 10/16/11 0507 10/15/11 1648  LABPROT 19.3* 18.6*  INR 1.60* 1.52*   ABG No results found for this basename: PHART:2,PCO2:2,PO2:2,HCO3:2 in the last 72 hours  Studies/Results: Ct Guided Abscess Drain  10/16/2011  *RADIOLOGY REPORT*  CT GUIDED ABSCESS DRAIN  Date: 10/16/2011  Clinical History: 78 year old female with perforated diverticulitis and now diverticular abscess.  She presents for percutaneous drain placement.  Procedures Performed: 1. CT guided placement of a 12-French drain  Interventional Radiologist:  Sterling Big, MD  Sedation: Moderate (conscious) sedation was used.  1.5 mg Versed, 50 mcg Fentanyl were administered intravenously.  The patient's vital signs were monitored continuously by radiology nursing throughout the procedure.  Sedation Time: 30 minutes  Fluoroscopy time:  8 seconds  PROCEDURE/FINDINGS:   Informed consent was obtained from the patient following explanation of the procedure, risks, benefits and alternatives. The patient understands, agrees and consents for the procedure. All questions were addressed. A time out was performed.  Maximal barrier sterile technique utilized including caps, mask, sterile gowns, sterile gloves, large sterile drape, hand hygiene, and betadine skin prep.  A planning axial CT scan was performed.  The right lower quadrant diverticular abscess was identified.  A suitable skin entry site was selected and marked.  Local anesthesia of the skin underlying soft tissues was obtained by infiltration of 1% lidocaine.  Under direct CT guidance, an 18 gauge trocar needle was advanced to the skin, soft tissues, gluteal muscle and peritoneal lining into the diverticular abscess.  A small amount of frankly purulent material was aspirated.  A short stiff Amplatz wire was then advanced through the needle and coiled within the abscess cavity.  Serial dilatation of the tract was then performed and ultimately a 12 [place] Cook multipurpose drainage catheter was advanced over the wire and coiled within the abscess cavity.  Approximately 20 ml of foul-smelling material was then aspirated.  A final axial CT image was performed confirming successful placement of the pigtail catheter  within the previously identified abscess collection. The drainage catheter was then connected to bulb suction and secured to the skin with Prolene suture and an Accustick adhesive device.  Aspirated material was sent for culture.  The patient tolerated the procedure well, there is no immediate complication.  The  patient was returned to the floor in stable condition.  IMPRESSION:  Successful CT guided placement of a 12-French drain into the pelvic diverticular abscess.  Aspirated material sent for Gram stain and culture.  Signed,  Sterling Big, MD Vascular & Interventional Radiologist [facility]  Original Report Authenticated By: Threasa Beards Abdomen Pelvis W Contrast  10/15/2011  **ADDENDUM** CREATED: 10/15/2011 15:27:49  These results were called by telephone on 10/15/2011 at 1528 hours to Dr. Murray Hodgkins, who verbally acknowledged these results.  **END ADDENDUM** SIGNED BY: Charline Bills, M.D.   10/15/2011  *RADIOLOGY REPORT*  Clinical Data: Follow-up diverticulitis with small contained perforation, lower abdominal cramping x3 weeks  CT ABDOMEN AND PELVIS WITH CONTRAST  Technique:  Multidetector CT imaging of the abdomen and pelvis was performed following the standard protocol during bolus administration of intravenous contrast.  Contrast: 100 ml Omnipaque-300 IV  Comparison: 09/16/2010  Findings: Paraseptal emphysematous changes in the right lower lobe.  Moderate hiatal hernia.  Mildly nodular hepatic contour (series 3/image 21), raising the possibility of cirrhosis.  No focal hepatic lesion.  Spleen, pancreas, and adrenal glands are within normal limits.  Gallbladder is underdistended.  No intrahepatic or extrahepatic ductal dilatation.  Kidneys are within normal limits.  No hydronephrosis.  No evidence of bowel obstruction.  Normal appendix.  Sigmoid diverticulitis with associated 3.9 x 4.6 x 4.7 cm diverticular abscess (series 3/image 57).  Extensive atherosclerotic calcifications of the abdominal aorta and branch vessels.  No abdominopelvic ascites.  No suspicious abdominopelvic lymphadenopathy.  Status post hysterectomy.  No adnexal masses.  Bladder is within normal limits.  Degenerative changes of the visualized thoracolumbar spine.  IMPRESSION: Sigmoid diverticulitis with associated 4.7 cm  diverticular abscess.  Additional stable ancillary findings as above. Original Report Authenticated By: Charline Bills, M.D.    Anti-infectives: Anti-infectives     Start     Dose/Rate Route Frequency Ordered Stop   10/15/11 1800   ciprofloxacin (CIPRO) IVPB 400 mg        400 mg 200 mL/hr over 60 Minutes Intravenous Every 12 hours 10/15/11 1639     10/15/11 1800   metroNIDAZOLE (FLAGYL) IVPB 500 mg        500 mg 100 mL/hr over 60 Minutes Intravenous Every 8 hours 10/15/11 1639            Assessment/Plan: Sigmoid diverticulitis with diverticular abcess s/p * No surgery found * Plan: 1. Advance to clear liquids today 2. Continue ABX,,  3.JP drain. 4. Medical management per medicine team for her other medical issues. 5. Repeat labs in am 6. We will continue to follow.   LOS: 2 days    Anzal Bartnick 10/17/2011

## 2011-10-17 NOTE — Progress Notes (Signed)
Patient picked at a bump to left forehead. Area open order bactroban to apply

## 2011-10-17 NOTE — Clinical Social Work Psychosocial (Signed)
     Clinical Social Work Department BRIEF PSYCHOSOCIAL ASSESSMENT 10/17/2011  Patient:  [name], [name]     Account Number:  [id number]     Admit date:  10/15/2011  Clinical Social Worker:  Jacelyn Grip  Date/Time:  10/17/2011 11:50 AM  Referred by:  RN  Date Referred:  10/16/2011 Referred for  SNF Placement   Other Referral:   Interview type:  Patient Other interview type:    PSYCHOSOCIAL DATA Living Status:  FACILITY Admitted from facility:  [facility] Level of care:  Assisted Living Primary support name:  Natalia Leatherwood [name]/friend/[phone] Primary support relationship to patient:  FRIEND Degree of support available:    CURRENT CONCERNS Current Concerns  Post-Acute Placement   Other Concerns:    SOCIAL WORK ASSESSMENT / PLAN CSW received referral that pt is from [facility] and wanting rehab at [facility] at discharge. CSW met with pt at bedside to discuss. Pt interested in rehab at [facility] before returning to her apartment at [facility]. CSW discussed that PT plans to work with pt during acute hospital stay as well. CSW contacted [facility] admission coordinator and left message. CSW received return phone call from [facility] stating that pt able to admit to rehab at [facility] at discharge. CSW to send pt clinical information to facility. CSW to continue to follow and facilitate pt discharge needs when pt medically stable for discharge.   Assessment/plan status:  Psychosocial Support/Ongoing Assessment of Needs Other assessment/ plan:   discharge planning   Information/referral to community resources:   Referral to [facility]    PATIENTS/FAMILYS RESPONSE TO PLAN OF CARE: Pt alert and oriented. Pt is motivated for rehab at [facility] in order to return back to her apartment at [facility].

## 2011-10-18 ENCOUNTER — Other Ambulatory Visit: Payer: Medicare Other

## 2011-10-18 LAB — BASIC METABOLIC PANEL
BUN: 8 mg/dL (ref 6–23)
CO2: 27 mEq/L (ref 19–32)
Chloride: 99 mEq/L (ref 96–112)
Glucose, Bld: 110 mg/dL — ABNORMAL HIGH (ref 70–99)
Potassium: 3.8 mEq/L (ref 3.5–5.1)
Sodium: 138 mEq/L (ref 135–145)

## 2011-10-18 LAB — CBC
HCT: 30 % — ABNORMAL LOW (ref 36.0–46.0)
Hemoglobin: 9.1 g/dL — ABNORMAL LOW (ref 12.0–15.0)
MCH: 23.9 pg — ABNORMAL LOW (ref 26.0–34.0)
MCHC: 30.3 g/dL (ref 30.0–36.0)
MCV: 78.9 fL (ref 78.0–100.0)
Platelets: 345 10*3/uL (ref 150–400)
RBC: 3.8 MIL/uL — ABNORMAL LOW (ref 3.87–5.11)
RDW: 18.4 % — ABNORMAL HIGH (ref 11.5–15.5)

## 2011-10-18 LAB — PROTIME-INR: INR: 1.61 — ABNORMAL HIGH (ref 0.00–1.49)

## 2011-10-18 LAB — CLOSTRIDIUM DIFFICILE BY PCR: Toxigenic C. Difficile by PCR: POSITIVE — AB

## 2011-10-18 MED ORDER — SODIUM CHLORIDE 0.9 % IV SOLN
1.0000 g | INTRAVENOUS | Status: DC
  Administered 2011-10-18 – 2011-10-22 (×5): 1 g via INTRAVENOUS
  Filled 2011-10-18 (×7): qty 1

## 2011-10-18 MED ORDER — VANCOMYCIN 50 MG/ML ORAL SOLUTION
125.0000 mg | Freq: Four times a day (QID) | ORAL | Status: DC
  Administered 2011-10-18 – 2011-10-24 (×21): 125 mg via ORAL
  Filled 2011-10-18 (×27): qty 2.5

## 2011-10-18 MED ORDER — WARFARIN - PHARMACIST DOSING INPATIENT: Freq: Every day | Status: DC

## 2011-10-18 MED ORDER — WARFARIN SODIUM 7.5 MG PO TABS
7.5000 mg | ORAL_TABLET | Freq: Once | ORAL | Status: AC
  Administered 2011-10-18: 7.5 mg via ORAL
  Filled 2011-10-18: qty 1

## 2011-10-18 NOTE — Evaluation (Signed)
Physical Therapy Evaluation Patient Details Name: [name] MRN: [id number] DOB: Oct 13, 1919 Today's Date: 10/18/2011 Time: 6578-4696 PT Time Calculation (min): 33 min  PT Assessment / Plan / Recommendation Clinical Impression  Pt admitted with diverticular abscess s/p pelvic drain who is legally blind but states she typically performs ADLs without assist. Pt states she is not currently at baseline and desires ST-SNF before return to ALF. Pt will benefit from acute therapy to maximize gait, transfers and safety prior to discharge    PT Assessment  Patient needs continued PT services    Follow Up Recommendations  Skilled nursing facility    Barriers to Discharge None      Equipment Recommendations  Defer to next venue    Recommendations for Other Services     Frequency Min 3X/week    Precautions / Restrictions Precautions Precautions: Fall Precaution Comments: pelvic drain   Pertinent Vitals/Pain 2/10 at incision      Mobility  Bed Mobility Bed Mobility: Rolling Right;Right Sidelying to Sit Rolling Right: 4: Min assist Right Sidelying to Sit: 4: Min assist;HOB flat Details for Bed Mobility Assistance: cueing for sequence for efficiency of movement and assist to bring legs to EOB and elevate trunk Transfers Transfers: Sit to Stand;Stand to Sit Sit to Stand: 4: Min guard;From bed;From toilet Stand to Sit: 4: Min guard;To toilet;To chair/3-in-1 Details for Transfer Assistance: cueing for hand placement and safety Ambulation/Gait Ambulation/Gait Assistance: 4: Min guard Ambulation Distance (Feet): 90 Feet (15' then 26') Assistive device: 4-wheeled walker Ambulation/Gait Assistance Details: cueing to step closer to the RW and extend trunk as well as cueing for safety not to hit objects x 2 Gait Pattern: Step-through pattern;Decreased stride length;Trunk flexed Gait velocity: decreased Stairs: No    Exercises     PT Diagnosis: Difficulty walking;Acute pain  PT  Problem List: Decreased activity tolerance;Decreased mobility;Decreased knowledge of use of DME;Pain PT Treatment Interventions: Gait training;DME instruction;Functional mobility training;Therapeutic activities;Therapeutic exercise;Patient/family education   PT Goals Acute Rehab PT Goals PT Goal Formulation: With patient Time For Goal Achievement: 11/01/11 Potential to Achieve Goals: Good Pt will go Supine/Side to Sit: with supervision PT Goal: Supine/Side to Sit - Progress: Goal set today Pt will go Sit to Supine/Side: with supervision PT Goal: Sit to Supine/Side - Progress: Goal set today Pt will go Sit to Stand: with supervision PT Goal: Sit to Stand - Progress: Goal set today Pt will go Stand to Sit: with supervision PT Goal: Stand to Sit - Progress: Goal set today Pt will Ambulate: >150 feet;with supervision;with least restrictive assistive device PT Goal: Ambulate - Progress: Goal set today  Visit Information  Last PT Received On: 10/18/11 Assistance Needed: +1    Subjective Data  Subjective: I am legally blind Patient Stated Goal: "I want to go back to [name] but to the rehab part to get to my apartment not the other rehab there"   Prior Functioning  Home Living Lives With: Other (Comment) Available Help at Discharge: Available PRN/intermittently Type of Home: Assisted living Home Access: Level entry Home Layout: One level Bathroom Shower/Tub: Health visitor: Standard Home Adaptive Equipment: Environmental consultant - four wheeled Prior Function Level of Independence: Needs assistance Needs Assistance: Meal Prep;Light Housekeeping Meal Prep: Total Light Housekeeping: Total Able to Take Stairs?: No Driving: No Vocation: Retired Musician: No difficulties    Cognition  Overall Cognitive Status: Appears within functional limits for tasks assessed/performed Arousal/Alertness: Awake/alert Orientation Level: Appears intact for tasks  assessed Behavior During Session: [facility] for  tasks performed    Extremity/Trunk Assessment Right Lower Extremity Assessment RLE ROM/Strength/Tone: [facility] for tasks assessed Left Lower Extremity Assessment LLE ROM/Strength/Tone: [facility] for tasks assessed   Balance    End of Session PT - End of Session Activity Tolerance: Patient tolerated treatment well Patient left: in chair;with call bell/phone within reach Nurse Communication: Mobility status  GP     Toney Sang [facility] 10/18/2011, 11:47 AM  Delaney Meigs, PT [phone]

## 2011-10-18 NOTE — Progress Notes (Signed)
ANTICOAGULATION CONSULT NOTE - Initial Consult  Pharmacy Consult for Coumadin Indication: atrial fibrillation  Allergies  Allergen Reactions  . Cortisone Other (See Comments)    Unknown   . Minocin (Minocycline Hcl) Other (See Comments)    Unknown  . Minocycline Hcl   . Neomycin-Bacitracin Zn-Polymyx   . Neosporin (Neomycin-Bacitracin Zn-Polymyx) Other (See Comments)    Unknown  . Other Other (See Comments)    Steroidal Neuromuscular Blockers  . Tetanus Toxoids Other (See Comments)    Unknown    Patient Measurements: Height: 5\' 4"  (162.6 cm) Weight: 160 lb 11.2 oz (72.893 kg) IBW/kg (Calculated) : 54.7   Vital Signs: Temp: 98.5 F (36.9 C) (07/25 0523) Temp src: Oral (07/25 0523) BP: 146/78 mmHg (07/25 0523) Pulse Rate: 70  (07/25 0523)  Labs:  Alvira Philips 10/18/11 0514 10/17/11 0645 10/16/11 0507 10/15/11 1648  HGB 9.1* 9.0* -- --  HCT 30.0* 30.0* 27.5* --  PLT 345 320 279 --  APTT -- -- -- --  LABPROT 19.4* -- 19.3* 18.6*  INR 1.61* -- 1.60* 1.52*  HEPARINUNFRC -- -- -- --  CREATININE 0.97 0.91 0.91 --  CKTOTAL -- -- -- --  CKMB -- -- -- --  TROPONINI -- -- -- --    Estimated Creatinine Clearance: 37.7 ml/min (by C-G formula based on Cr of 0.97).   Medical History: Past Medical History  Diagnosis Date  . Cholelithiases   . CHF (congestive heart failure)   . Hypothyroid   . CAD (coronary artery disease)   . Leg paresthesia   . Aortic valve stenosis   . Arthritis   . Hyperlipidemia   . Dementia   . Hypoxemia   . Carotid artery occlusion   . Renal disorder   . Hypertension   . COPD (chronic obstructive pulmonary disease)   . Hyperlipemia   . SEIZURE DISORDER 11/05/2006  . GERD 11/05/2006  . CVA 02/07/2007  . CONGESTIVE HEART FAILURE 09/13/2006  . HYPOTHYROIDISM 09/13/2006  . Macular degeneration of both eyes   . Blind in both eyes   . Peripheral neuropathy   . Heart murmur     "a bad one!!!!!!"  . Pneumonia     "many times"  . History of  bronchitis     "get it q once in awhile"  . Exertional dyspnea   . Internal hemorrhoid, bleeding   . Malaria     "as a child"  . Skin cancer     "nose, face mostly"  . UTI (lower urinary tract infection) 10/15/11  . Chronic lower back pain     "when I go to the bathroom"    Medications:  Scheduled:    . antiseptic oral rinse  15 mL Mouth Rinse BID  . atorvastatin  10 mg Oral q1800  . ertapenem  1 g Intravenous Q24H  . ferrous sulfate  325 mg Oral BID WC  . levothyroxine  88 mcg Oral QAC breakfast  . mupirocin ointment   Nasal BID  . olopatadine  1 drop Both Eyes BID  . pantoprazole  40 mg Oral Q1200  . tiotropium  18 mcg Inhalation Daily  . vitamin C  250 mg Oral BID  . warfarin  5 mg Oral q1800  . DISCONTD: amoxicillin-clavulanate  1 tablet Oral Q12H  . DISCONTD: Warfarin - Physician Dosing Inpatient   Does not apply q1800    Assessment: 89 YOF admitted 7/22 w/ diverticular abscess. Pharmacy consulted to dose Coumadin for hx of A. Fib. Pt currently  subtherapeutic on Coumadin 5mg  with an INR of 1.61. H&H and plts stable.   Goal of Therapy:  INR 2-3 Monitor platelets by anticoagulation protocol: Yes   Plan:  1) Coumadin 7.5mg  x 1 dose 2) F/u with INR in am  3) Monitor for signs and symptoms of bleeding   Franchot Erichsen, Pharm.D. Clinical Pharmacist   Pager: [phone] Phone: [phone] 10/18/2011 2:31 PM

## 2011-10-18 NOTE — Progress Notes (Signed)
eLink Physician-Brief Progress Note Patient Name: [name] DOB: 28-Oct-1919 MRN: [id number]  Date of Service  10/18/2011   HPI/Events of Note   C. Diff pcr POS  eICU Interventions  PO vanc per order set, d/w pharmacy   Intervention Category Intermediate Interventions: Diagnostic test evaluation  [name],[name] 10/18/2011, 5:32 PM

## 2011-10-18 NOTE — Progress Notes (Signed)
Subjective: No complaints  Some loose stool  Objective: Vital signs in last 24 hours: Temp:  [98.5 F (36.9 C)-100.2 F (37.9 C)] 98.5 F (36.9 C) (07/25 0523) Pulse Rate:  [70-88] 70  (07/25 0523) Resp:  [18-22] 22  (07/25 0523) BP: (129-154)/(72-78) 146/78 mmHg (07/25 0523) SpO2:  [93 %-99 %] 98 % (07/25 0523) Last BM Date: 10/17/11  Intake/Output from previous day: 07/24 0701 - 07/25 0700 In: 473 [P.O.:458] Out: 52 [Drains:52] Intake/Output this shift:    GI: abnormal findings:  mild central tenderness.  no gross peritonitis.  drain minimal bloody  Lab Results:   [name] 10/18/11 0514 10/17/11 0645  WBC 20.3* 17.2*  HGB 9.1* 9.0*  HCT 30.0* 30.0*  PLT 345 320   BMET  Basename 10/18/11 0514 10/17/11 0645  NA 138 133*  K 3.8 4.0  CL 99 96  CO2 27 25  GLUCOSE 110* 106*  BUN 8 7  CREATININE 0.97 0.91  CALCIUM 8.8 8.9   PT/INR  Basename 10/18/11 0514 10/16/11 0507  LABPROT 19.4* 19.3*  INR 1.61* 1.60*   ABG No results found for this basename: PHART:2,PCO2:2,PO2:2,HCO3:2 in the last 72 hours  Studies/Results: Ct Guided Abscess Drain  10/16/2011  *RADIOLOGY REPORT*  CT GUIDED ABSCESS DRAIN  Date: 10/16/2011  Clinical History: 78 year old female with perforated diverticulitis and now diverticular abscess.  She presents for percutaneous drain placement.  Procedures Performed: 1. CT guided placement of a 12-French drain  Interventional Radiologist:  Sterling Big, MD  Sedation: Moderate (conscious) sedation was used.  1.5 mg Versed, 50 mcg Fentanyl were administered intravenously.  The patient's vital signs were monitored continuously by radiology nursing throughout the procedure.  Sedation Time: 30 minutes  Fluoroscopy time:  8 seconds  PROCEDURE/FINDINGS:   Informed consent was obtained from the patient following explanation of the procedure, risks, benefits and alternatives. The patient understands, agrees and consents for the procedure. All questions  were addressed. A time out was performed.  Maximal barrier sterile technique utilized including caps, mask, sterile gowns, sterile gloves, large sterile drape, hand hygiene, and betadine skin prep.  A planning axial CT scan was performed.  The right lower quadrant diverticular abscess was identified.  A suitable skin entry site was selected and marked.  Local anesthesia of the skin underlying soft tissues was obtained by infiltration of 1% lidocaine.  Under direct CT guidance, an 18 gauge trocar needle was advanced to the skin, soft tissues, gluteal muscle and peritoneal lining into the diverticular abscess.  A small amount of frankly purulent material was aspirated.  A short stiff Amplatz wire was then advanced through the needle and coiled within the abscess cavity.  Serial dilatation of the tract was then performed and ultimately a 12 [place] Cook multipurpose drainage catheter was advanced over the wire and coiled within the abscess cavity.  Approximately 20 ml of foul-smelling material was then aspirated.  A final axial CT image was performed confirming successful placement of the pigtail catheter within the previously identified abscess collection. The drainage catheter was then connected to bulb suction and secured to the skin with Prolene suture and an Accustick adhesive device.  Aspirated material was sent for culture.  The patient tolerated the procedure well, there is no immediate complication.  The patient was returned to the floor in stable condition.  IMPRESSION:  Successful CT guided placement of a 12-French drain into the pelvic diverticular abscess.  Aspirated material sent for Gram stain and culture.  Signed,  Sterling Big,  MD Vascular & Interventional Radiologist [facility]  Original Report Authenticated By: Vilma Prader    Anti-infectives: Anti-infectives     Start     Dose/Rate Route Frequency Ordered Stop   10/17/11 1400   amoxicillin-clavulanate (AUGMENTIN) 875-125 MG per tablet  1 tablet        1 tablet Oral Every 12 hours 10/17/11 1255     10/15/11 1800   ciprofloxacin (CIPRO) IVPB 400 mg  Status:  Discontinued        400 mg 200 mL/hr over 60 Minutes Intravenous Every 12 hours 10/15/11 1639 10/17/11 1254   10/15/11 1800   metroNIDAZOLE (FLAGYL) IVPB 500 mg  Status:  Discontinued        500 mg 100 mL/hr over 60 Minutes Intravenous Every 8 hours 10/15/11 1639 10/17/11 1254          Assessment/Plan: Diverticular abscess with drain Increasing WBC  Restart IV ABX and stop augmentin  Will need repeat CT next week.  invanz may work better.  Check stool for c diff   LOS: 3 days    [name] A. 10/18/2011

## 2011-10-18 NOTE — Progress Notes (Signed)
CC: Abdominal pain.   History of Present Illness:  [name] is a 78 y.o. female hospitalized from 09/15/11 to 09/21/11 with diverticulitis. She had persistent abdominal pain and constipation. CT abd/pelvis 10/15/11 showed diverticular abscess and she was admitted to hospital.  Ms. [name] is followed in the pulmonary office for COPD, chronic hypoxic respiratory failure, and lung nodule.   Abx: Cipro 7/22>>7/24 Flagyl 7/22>>7/24 Augmentin 7/24>>7/25 Invanz 7/25>>  Cx: Abdominal abscess 7/23>> C diff PCR 7/25>>  Events: 7/23 Abdominal pig-tail catheter inserted by IR 7/24 Change to po Abx 7/25 Fever, increased WBC>>change back to IV Abx  Subjective:  Winded after working with PT this AM.  Denies abd pain.  Tolerating diet.  No BM since yesterday.  Vitals: Filed Vitals:   10/17/11 1450 10/17/11 2124 10/18/11 0523 10/18/11 0906  BP:  154/72 146/78   Pulse:  86 70   Temp: 99.6 F (37.6 C) 99.7 F (37.6 C) 98.5 F (36.9 C)   TempSrc:  Oral Oral   Resp:  20 22   Height:      Weight:      SpO2:  93% 98% 95%    Intake/Output Summary (Last 24 hours) at 10/18/11 1150 Last data filed at 10/18/11 0900  Gross per 24 hour  Intake    595 ml  Output     52 ml  Net    543 ml    Physical Exam: General - Appears uncomfortable  ENT - no sinus tenderness  Cardiac - s1s2 regular, 3/6 SM  Chest - prolonged exhalation, no wheeze/rales  Abd - soft, non-tender, no rebound/guarding, + bowel sounds  Ext - no edema  Neuro - normal strength  Skin - no rash  Psych - normal mood, and behavior.  Ct Guided Abscess Drain  10/16/2011  *RADIOLOGY REPORT*  CT GUIDED ABSCESS DRAIN  Date: 10/16/2011  Clinical History: 78 year old female with perforated diverticulitis and now diverticular abscess.  She presents for percutaneous drain placement.  Procedures Performed: 1. CT guided placement of a 12-French drain  Interventional Radiologist:  Sterling Big, MD  Sedation: Moderate  (conscious) sedation was used.  1.5 mg Versed, 50 mcg Fentanyl were administered intravenously.  The patient's vital signs were monitored continuously by radiology nursing throughout the procedure.  Sedation Time: 30 minutes  Fluoroscopy time:  8 seconds  PROCEDURE/FINDINGS:   Informed consent was obtained from the patient following explanation of the procedure, risks, benefits and alternatives. The patient understands, agrees and consents for the procedure. All questions were addressed. A time out was performed.  Maximal barrier sterile technique utilized including caps, mask, sterile gowns, sterile gloves, large sterile drape, hand hygiene, and betadine skin prep.  A planning axial CT scan was performed.  The right lower quadrant diverticular abscess was identified.  A suitable skin entry site was selected and marked.  Local anesthesia of the skin underlying soft tissues was obtained by infiltration of 1% lidocaine.  Under direct CT guidance, an 18 gauge trocar needle was advanced to the skin, soft tissues, gluteal muscle and peritoneal lining into the diverticular abscess.  A small amount of frankly purulent material was aspirated.  A short stiff Amplatz wire was then advanced through the needle and coiled within the abscess cavity.  Serial dilatation of the tract was then performed and ultimately a 12 [place] multipurpose drainage catheter was advanced over the wire and coiled within the abscess cavity.  Approximately 20 ml of foul-smelling material was then aspirated.  A final axial  CT image was performed confirming successful placement of the pigtail catheter within the previously identified abscess collection. The drainage catheter was then connected to bulb suction and secured to the skin with Prolene suture and an Accustick adhesive device.  Aspirated material was sent for culture.  The patient tolerated the procedure well, there is no immediate complication.  The patient was returned to the floor in  stable condition.  IMPRESSION:  Successful CT guided placement of a 12-French drain into the pelvic diverticular abscess.  Aspirated material sent for Gram stain and culture.  Signed,  Sterling Big, MD Vascular & Interventional Radiologist [facility] Radiology  Original Report Authenticated By: Vilma Prader   Lab Results  Component Value Date   CREATININE 0.97 10/18/2011   BUN 8 10/18/2011   NA 138 10/18/2011   K 3.8 10/18/2011   CL 99 10/18/2011   CO2 27 10/18/2011   Lab Results  Component Value Date   ALT 9 10/15/2011   AST 24 10/15/2011   ALKPHOS 76 10/15/2011   BILITOT 0.4 10/15/2011   Lab Results  Component Value Date   WBC 20.3* 10/18/2011   HGB 9.1* 10/18/2011   HCT 30.0* 10/18/2011   MCV 78.9 10/18/2011   PLT 345 10/18/2011   Assessment/Plan:  A: Diverticular abscess s/p drain insertion 7/23 by IR. P:  Low fiber diet Appreciate help from IR and general surgery Defer timing of f/u CT abd to CCS D4/x current on invanz  PRN low dose morphine for pain control  Tylenol prn for fever F/u stool c diff pcr  A: Hx of COPD  P:  Continue spiriva  PRN nebulizer therapy   A: Chronic hypoxic respiratory failure  P:  Supplemental oxygen to keep SpO2 > 92%   A: Hx of A fib, HTN, CHF, CAD, Aortic stenosis  P:  Continue coumadin 5 mg daily and adjust per pharmacy Continue lipitor 10 mg daily  A: Chronic kidney disease  P:  F/u renal fx intermittently, monitor urine outpt   A: Hypothyroidism  P:  Continue synthroid  A: Anemia 2nd to iron deficiency P:  Continue FeSO4 with Vitamin C F/u CBC  Transfuse for Hb < 7  Coralyn Helling, MD [facility] Pulmonary/Critical Care 10/18/2011, 11:50 AM Pager:  [phone] After 3pm call: [phone]

## 2011-10-19 DIAGNOSIS — A0472 Enterocolitis due to Clostridium difficile, not specified as recurrent: Secondary | ICD-10-CM | POA: Diagnosis not present

## 2011-10-19 LAB — CBC
HCT: 28.4 % — ABNORMAL LOW (ref 36.0–46.0)
MCHC: 29.9 g/dL — ABNORMAL LOW (ref 30.0–36.0)
RDW: 18.5 % — ABNORMAL HIGH (ref 11.5–15.5)

## 2011-10-19 LAB — PROTIME-INR
INR: 1.55 — ABNORMAL HIGH (ref 0.00–1.49)
Prothrombin Time: 18.9 seconds — ABNORMAL HIGH (ref 11.6–15.2)

## 2011-10-19 LAB — CULTURE, ROUTINE-ABSCESS

## 2011-10-19 MED ORDER — SACCHAROMYCES BOULARDII 250 MG PO CAPS
250.0000 mg | ORAL_CAPSULE | Freq: Two times a day (BID) | ORAL | Status: DC
  Administered 2011-10-19 – 2011-10-24 (×11): 250 mg via ORAL
  Filled 2011-10-19 (×12): qty 1

## 2011-10-19 MED ORDER — FAMOTIDINE 20 MG PO TABS
20.0000 mg | ORAL_TABLET | Freq: Every day | ORAL | Status: DC
  Administered 2011-10-19 – 2011-10-23 (×5): 20 mg via ORAL
  Filled 2011-10-19 (×7): qty 1

## 2011-10-19 MED ORDER — WARFARIN SODIUM 7.5 MG PO TABS
7.5000 mg | ORAL_TABLET | Freq: Once | ORAL | Status: AC
  Administered 2011-10-19: 7.5 mg via ORAL
  Filled 2011-10-19: qty 1

## 2011-10-19 NOTE — Progress Notes (Signed)
Physical Therapy Treatment Patient Details Name: Alla Sloma MRN: [id number] DOB: 23-Mar-1920 Today's Date: 10/19/2011 Time: 1478-2956 PT Time Calculation (min): 25 min  PT Assessment / Plan / Recommendation Comments on Treatment Session  Pt with diverticular abscess, pelvic drain and Cdiff who is progressing with mobility and ambulation and encouraged to continue over the weekend with nursing assist. Will continue to follow to maximize independence    Follow Up Recommendations       Barriers to Discharge        Equipment Recommendations       Recommendations for Other Services    Frequency     Plan Discharge plan remains appropriate;Frequency remains appropriate    Precautions / Restrictions Precautions Precautions: Fall   Pertinent Vitals/Pain No pain    Mobility  Bed Mobility Bed Mobility: Supine to Sit Supine to Sit: 5: Supervision;With rails;HOB elevated (HOB 20degrees) Details for Bed Mobility Assistance: supervision for line Transfers Transfers: Sit to Stand;Stand to Sit Sit to Stand: 5: Supervision;From bed;From chair/3-in-1 Stand to Sit: 5: Supervision;To chair/3-in-1 Details for Transfer Assistance: cueing for hand placement and safety Ambulation/Gait Ambulation/Gait Assistance: 4: Min guard Ambulation Distance (Feet): 150 Feet Assistive device: 4-wheeled walker Ambulation/Gait Assistance Details: cueing to step closer to rollator Gait Pattern: Step-through pattern;Trunk flexed;Decreased stride length Stairs: No    Exercises General Exercises - Lower Extremity Long Arc Quad: AROM;Both;20 reps;Seated Hip Flexion/Marching: AROM;Both;20 reps;Seated   PT Diagnosis:    PT Problem List:   PT Treatment Interventions:     PT Goals Acute Rehab PT Goals Pt will go Supine/Side to Sit: with modified independence PT Goal: Supine/Side to Sit - Progress: Updated due to goal met Pt will go Sit to Stand: with modified independence PT Goal: Sit to Stand -  Progress: Updated due to goal met Pt will go Stand to Sit: with modified independence PT Goal: Stand to Sit - Progress: Updated due to goals met Pt will Ambulate: >150 feet;with modified independence;with least restrictive assistive device PT Goal: Ambulate - Progress: Updated due to goal met  Visit Information  Last PT Received On: 10/19/11 Assistance Needed: +1    Subjective Data  Subjective: I sat up for an hour yesterday   Cognition  Overall Cognitive Status: Appears within functional limits for tasks assessed/performed Arousal/Alertness: Awake/alert Orientation Level: Appears intact for tasks assessed Behavior During Session: [facility] for tasks performed    Balance     End of Session PT - End of Session Activity Tolerance: Patient tolerated treatment well Patient left: in chair;with call bell/phone within reach   GP     Delorse Lek 10/19/2011, 11:42 AM Delaney Meigs, PT [phone]

## 2011-10-19 NOTE — Progress Notes (Signed)
Clinical Social Worker continuing to follow for discharge planning. Plan for discharge is for [facility] before returning to [facility]. Clinical Social Worker discussed with pt at bedside. Clinical Social Worker left message with [facility] admission coordinator that pt not yet medically ready for discharge. Clinical Social Worker to facilitate pt discharge needs when pt medically ready for discharge.  Jacklynn Lewis, MSW, LCSWA  Clinical Social Work [phone]

## 2011-10-19 NOTE — Progress Notes (Signed)
Subjective: Pt ok. C/o mild soreness at drain site. Also now with + C.Diff  Objective: Physical Exam: BP 117/64  Pulse 63  Temp 98.8 F (37.1 C) (Oral)  Resp 18  Ht 5\' 4"  (1.626 m)  Wt 160 lb 11.2 oz (72.893 kg)  BMI 27.58 kg/m2  SpO2 91% Drain intact, site clean except dressing needs changing. Greenish feculent output seen today. About 35cc recorded yesterday, maybe 10cc in bulb now,.    Labs: CBC  Basename 10/19/11 0520 10/18/11 0514  WBC 14.1* 20.3*  HGB 8.5* 9.1*  HCT 28.4* 30.0*  PLT 340 345   BMET  Basename 10/18/11 0514 10/17/11 0645  NA 138 133*  K 3.8 4.0  CL 99 96  CO2 27 25  GLUCOSE 110* 106*  BUN 8 7  CREATININE 0.97 0.91  CALCIUM 8.8 8.9   LFT No results found for this basename: PROT,ALBUMIN,AST,ALT,ALKPHOS,BILITOT,BILIDIR,IBILI,LIPASE in the last 72 hours PT/INR  Basename 10/19/11 0520 10/18/11 0514  LABPROT 18.9* 19.4*  INR 1.55* 1.61*     Studies/Results: No results found.  Assessment/Plan: Diverticulitis with abscess. S/p perc drain 7/23 OUtput change concerning for fistula. Agree with need for drain study and repeat CT, maybe early next week.    LOS: 4 days    Brayton El PA-C 10/19/2011 11:45 AM

## 2011-10-19 NOTE — Progress Notes (Signed)
Patient ID: [name], female   DOB: 07/19/1919, 78 y.o.   MRN: [id number] Patient ID: [name], female   DOB: 1919-09-04, 78 y.o.   MRN: [id number]    Subjective: Lying flat in bed resting comfortably. States that she feels good.  Objective: Vital signs in last 24 hours: Temp:  [98.8 F (37.1 C)-99.9 F (37.7 C)] 98.8 F (37.1 C) (07/26 0417) Pulse Rate:  [63-77] 63  (07/26 0417) Resp:  [18-20] 18  (07/26 0417) BP: (107-126)/(58-64) 117/64 mmHg (07/26 0417) SpO2:  [93 %-98 %] 97 % (07/26 0417) Last BM Date: 10/17/11  Intake/Output from previous day: 07/25 0701 - 07/26 0700 In: 610 [P.O.:600] Out: 15 [Drains:15] Intake/Output this shift:    General appearance: alert, cooperative, appears stated age, no distress and mildly obese Chest: decreased BS bilaterally w/o ronchi or wheeze. Abdomen: soft, non tender, + BS, no bloating, JP drain in place, draining small amount of browinsh appearing fluid, no bile or stool. Extremities: no edema Vital signs are stable,Temp now 98.8,  WBC trending downward from yesterday. Currently 14.1 down from 20.3 yesterday.   Lab Results:   [facility] 10/19/11 0520 10/18/11 0514  WBC 14.1* 20.3*  HGB 8.5* 9.1*  HCT 28.4* 30.0*  PLT 340 345   BMET  Basename 10/18/11 0514 10/17/11 0645  NA 138 133*  K 3.8 4.0  CL 99 96  CO2 27 25  GLUCOSE 110* 106*  BUN 8 7  CREATININE 0.97 0.91  CALCIUM 8.8 8.9   PT/INR  Basename 10/19/11 0520 10/18/11 0514  LABPROT 18.9* 19.4*  INR 1.55* 1.61*   ABG No results found for this basename: PHART:2,PCO2:2,PO2:2,HCO3:2 in the last 72 hours  Studies/Results: No results found.  Anti-infectives: Anti-infectives     Start     Dose/Rate Route Frequency Ordered Stop   10/18/11 1800   vancomycin (VANCOCIN) 50 mg/mL oral solution 125 mg        125 mg Oral 4 times daily 10/18/11 1732 11/01/11 1759   10/18/11 0800   ertapenem (INVANZ) 1 g in sodium chloride 0.9 % 50 mL IVPB        1 g 100  mL/hr over 30 Minutes Intravenous Every 24 hours 10/18/11 0753     10/17/11 1400   amoxicillin-clavulanate (AUGMENTIN) 875-125 MG per tablet 1 tablet  Status:  Discontinued        1 tablet Oral Every 12 hours 10/17/11 1255 10/18/11 0842   10/15/11 1800   ciprofloxacin (CIPRO) IVPB 400 mg  Status:  Discontinued        400 mg 200 mL/hr over 60 Minutes Intravenous Every 12 hours 10/15/11 1639 10/17/11 1254   10/15/11 1800   metroNIDAZOLE (FLAGYL) IVPB 500 mg  Status:  Discontinued        500 mg 100 mL/hr over 60 Minutes Intravenous Every 8 hours 10/15/11 1639 10/17/11 1254          Assessment/Plan: Sigmoid diverticulitis with diverticular abcess s/p * No surgery found * Plan: 1. Continue ABX,,  2.JP drain. 3. Medical management per medicine team for her other medical issues. 4. We will continue to follow.   LOS: 4 days    Barnell Shieh 10/19/2011

## 2011-10-19 NOTE — Progress Notes (Addendum)
CC: Abdominal pain.   History of Present Illness:  [name] is a 78 y.o. female hospitalized from 09/15/11 to 09/21/11 with diverticulitis. She had persistent abdominal pain and constipation. CT abd/pelvis 10/15/11 showed diverticular abscess and she was admitted to hospital.  [name] is followed in the pulmonary office for COPD, chronic hypoxic respiratory failure, and lung nodule.   Abx: Cipro 7/22>>7/24 Flagyl 7/22>>7/24 Augmentin 7/24>>7/25 Invanz 7/25>> PO Vancomycin 7/25>>  Cx: Abdominal abscess 7/23>>Klebseialla pneumoniae, Microaerophilic streptococcus C diff PCR 7/25>>POSITIVE  Events: 7/23 Abdominal pig-tail catheter inserted by IR 7/24 Change to po Abx 7/25 Fever, increased WBC>>change back to IV Abx  Subjective:  Decreased abd pain.  Tolerating diet.  Breathing at baseline.  Vitals: Filed Vitals:   10/18/11 1431 10/18/11 2110 10/19/11 0417 10/19/11 0749  BP: 126/61 107/58 117/64   Pulse: 77 73 63   Temp: 99.9 F (37.7 C) 99.7 F (37.6 C) 98.8 F (37.1 C)   TempSrc: Oral Oral Oral   Resp: 20 18 18    Height:      Weight:      SpO2: 98% 93% 97% 91%    Intake/Output Summary (Last 24 hours) at 10/19/11 1043 Last data filed at 10/19/11 0900  Gross per 24 hour  Intake    488 ml  Output     15 ml  Net    473 ml    Physical Exam: General - Appears comfortable ENT - no sinus tenderness  Cardiac - s1s2 regular, 3/6 SM  Chest - prolonged exhalation, no wheeze/rales  Abd - soft, non-tender, no rebound/guarding, + bowel sounds  Ext - no edema  Neuro - normal strength  Skin - no rash  Psych - normal mood, and behavior.  No results found. Lab Results  Component Value Date   CREATININE 0.97 10/18/2011   BUN 8 10/18/2011   NA 138 10/18/2011   K 3.8 10/18/2011   CL 99 10/18/2011   CO2 27 10/18/2011    Lab Results  Component Value Date   WBC 14.1* 10/19/2011   HGB 8.5* 10/19/2011   HCT 28.4* 10/19/2011   MCV 79.1 10/19/2011   PLT 340  10/19/2011   Assessment/Plan:  A: Diverticular abscess s/p drain insertion 7/23 by IR.  Klebsiella and Microaerophilic streptococcus in abscess culture. P:  Low fiber diet Appreciate help from IR and general surgery Defer timing of f/u CT abd to CCS D5/x current on invanz  PRN low dose morphine for pain control  Tylenol prn for fever  A: C diff colitis P: D2/x oral vancomycin Add florastor D/c protonix and change to pepcid  A: Hx of COPD  P:  Continue spiriva  PRN nebulizer therapy   A: Chronic hypoxic respiratory failure  P:  Supplemental oxygen to keep SpO2 > 92%   A: Hx of A fib, HTN, CHF, CAD, Aortic stenosis  P:  Continue coumadin 5 mg daily and adjust per pharmacy Continue lipitor 10 mg daily  A: Chronic kidney disease  P:  F/u renal fx intermittently, monitor urine outpt   A: Hypothyroidism  P:  Continue synthroid  A: Anemia 2nd to iron deficiency P:  Continue FeSO4 with Vitamin C F/u CBC  Transfuse for Hb < 7  Coralyn Helling, MD [facility] Pulmonary/Critical Care 10/19/2011, 10:43 AM Pager:  [phone] After 3pm call: [phone]

## 2011-10-19 NOTE — Progress Notes (Signed)
ANTICOAGULATION CONSULT NOTE - Follow Up Consult  Pharmacy Consult for coumadin Indication: atrial fibrillation  Allergies  Allergen Reactions  . Cortisone Other (See Comments)    Unknown   . Minocin (Minocycline Hcl) Other (See Comments)    Unknown  . Minocycline Hcl   . Neomycin-Bacitracin Zn-Polymyx   . Neosporin (Neomycin-Bacitracin Zn-Polymyx) Other (See Comments)    Unknown  . Other Other (See Comments)    Steroidal Neuromuscular Blockers  . Tetanus Toxoids Other (See Comments)    Unknown    Patient Measurements: Height: 5\' 4"  (162.6 cm) Weight: 160 lb 11.2 oz (72.893 kg) IBW/kg (Calculated) : 54.7    Vital Signs: Temp: 98.8 F (37.1 C) (07/26 1305) Temp src: Oral (07/26 1305) BP: 116/57 mmHg (07/26 1305) Pulse Rate: 66  (07/26 1305)  Labs:  Basename 10/19/11 0520 10/18/11 0514 10/17/11 0645  HGB 8.5* 9.1* --  HCT 28.4* 30.0* 30.0*  PLT 340 345 320  APTT -- -- --  LABPROT 18.9* 19.4* --  INR 1.55* 1.61* --  HEPARINUNFRC -- -- --  CREATININE -- 0.97 0.91  CKTOTAL -- -- --  CKMB -- -- --  TROPONINI -- -- --    Estimated Creatinine Clearance: 37.7 ml/min (by C-G formula based on Cr of 0.97).   Medications:  Prescriptions prior to admission  Medication Sig Dispense Refill  . acetaminophen (TYLENOL) 325 MG tablet Take 650 mg by mouth every 6 (six) hours as needed. For pain      . acyclovir ointment (ZOVIRAX) 5 % Apply 1 application topically every 3 (three) hours as needed. For cold sores      . amoxicillin (AMOXIL) 500 MG capsule Take 2,000 mg by mouth once. Prior to dental procedure      . atorvastatin (LIPITOR) 10 MG tablet Take 10 mg by mouth daily.      . Cholecalciferol (VITAMIN D) 2000 UNITS CAPS Take 1 capsule by mouth daily.      Marland Kitchen donepezil (ARICEPT) 10 MG tablet Take 10 mg by mouth daily.      Marland Kitchen escitalopram (LEXAPRO) 20 MG tablet Take 20 mg by mouth daily.      Marland Kitchen estrogens, conjugated, (PREMARIN) 0.3 MG tablet Take 0.3 mg by mouth daily.  Take daily for 21 days then do not take for 7 days.      . fluticasone (FLONASE) 50 MCG/ACT nasal spray Place 2 sprays into the nose daily as needed. For dry nasal passages      . folic acid (FOLVITE) 1 MG tablet Take 1 mg by mouth daily.      Marland Kitchen HYDROcodone-acetaminophen (NORCO/VICODIN) 5-325 MG per tablet Take 1 tablet by mouth every 6 (six) hours as needed. For pain      . hydrocortisone-pramoxine (ANALPRAM-HC) 2.5-1 % rectal cream Place 1 application rectally 2 (two) times daily as needed. For hemorrhoids      . ipratropium-albuterol (DUONEB) 0.5-2.5 (3) MG/3ML SOLN Take 3 mLs by nebulization every 6 (six) hours as needed. For wheezing and shortness of breath      . meloxicam (MOBIC) 7.5 MG tablet Take 7.5 mg by mouth daily.      Marland Kitchen olopatadine (PATANOL) 0.1 % ophthalmic solution Place 1 drop into both eyes 2 (two) times daily as needed. For dry eyes      . omeprazole (PRILOSEC) 40 MG capsule Take 40 mg by mouth daily.      Bertram Gala Glycol-Propyl Glycol (SYSTANE) 0.4-0.3 % SOLN Place 1 drop into both eyes daily as needed.  For dry eyes      . polyethylene glycol (MIRALAX / GLYCOLAX) packet Take 17 g by mouth daily.      Marland Kitchen senna (SENOKOT) 8.6 MG TABS Take 1 tablet by mouth at bedtime as needed. For constipation      . tiotropium (SPIRIVA) 18 MCG inhalation capsule Place 18 mcg into inhaler and inhale daily.      Marland Kitchen warfarin (COUMADIN) 5 MG tablet Take 5 mg by mouth daily.       Scheduled:    . antiseptic oral rinse  15 mL Mouth Rinse BID  . atorvastatin  10 mg Oral q1800  . ertapenem  1 g Intravenous Q24H  . famotidine  20 mg Oral QHS  . ferrous sulfate  325 mg Oral BID WC  . levothyroxine  88 mcg Oral QAC breakfast  . mupirocin ointment   Nasal BID  . olopatadine  1 drop Both Eyes BID  . saccharomyces boulardii  250 mg Oral BID  . tiotropium  18 mcg Inhalation Daily  . vancomycin  125 mg Oral QID  . vitamin C  250 mg Oral BID  . warfarin  7.5 mg Oral ONCE-1800  . Warfarin -  Pharmacist Dosing Inpatient   Does not apply q1800  . DISCONTD: pantoprazole  40 mg Oral Q1200    Assessment: Pt is a 78 yo female admitted 7/22 with diverticular abcess. Pharmacy consulted to dose coumadin for atrial fibrillation. Pt on coumadin 5mg  PTA and subtherapeutic at INR 1.6. H&H (slight trend down).Today 7/26, INR subtherapeutic 1.55.  Goal of Therapy:  INR 2-3 Monitor platelets by anticoagulation protocol: Yes   Plan:  1. Coumadin 7.5 mg x 1 dose 2. Follow up INR in AM 3. Monitor s/sx of bleeding  Bola A. Wandra Feinstein D Clinical Pharmacist [phone] Phone [phone] 10/19/2011 3:07 PM

## 2011-10-19 NOTE — Progress Notes (Signed)
Has C Diff colitis on po vancomycin.  Drainage from drain now appears feculent.  Will need study through drain at some point to define anatomy.  WBC better today and less tender.  Need to clear c diff before anything else is done .  OK to feed.

## 2011-10-20 LAB — CBC
HCT: 30.3 % — ABNORMAL LOW (ref 36.0–46.0)
Hemoglobin: 9.1 g/dL — ABNORMAL LOW (ref 12.0–15.0)
MCH: 23.8 pg — ABNORMAL LOW (ref 26.0–34.0)
MCHC: 30 g/dL (ref 30.0–36.0)

## 2011-10-20 LAB — BASIC METABOLIC PANEL
BUN: 10 mg/dL (ref 6–23)
GFR calc non Af Amer: 72 mL/min — ABNORMAL LOW (ref >90)
Glucose, Bld: 99 mg/dL (ref 70–99)
Potassium: 3.6 mEq/L (ref 3.5–5.1)

## 2011-10-20 MED ORDER — WARFARIN SODIUM 10 MG PO TABS
10.0000 mg | ORAL_TABLET | Freq: Once | ORAL | Status: AC
  Administered 2011-10-20: 10 mg via ORAL
  Filled 2011-10-20: qty 1

## 2011-10-20 NOTE — Progress Notes (Signed)
Patient had bowel movement with scant amount of blood, patient does have hemmoroids.  Hgb 9.1 this am, doctor called and labs are ordered for morning and doctor will assess patient then.  Will continue to monitor.  Macarthur Critchley, RN

## 2011-10-20 NOTE — Progress Notes (Signed)
Subjective: Pt ok. C/o mild soreness at drain site and itching.   Objective: Physical Exam: BP 124/66  Pulse 75  Temp 98.3 F (36.8 C) (Oral)  Resp 18  Ht 5\' 4"  (1.626 m)  Wt 160 lb 11.2 oz (72.893 kg)  BMI 27.58 kg/m2  SpO2 93% Drain intact, site clean, new dressing in place. No swelling or erythema at drain site. Greenish feculent output seen again today. About 45cc recorded yesterday. Essentially nothing in bulb now as it was just emptied.   Labs: CBC  Basename 10/20/11 0530 10/19/11 0520  WBC 10.2 14.1*  HGB 9.1* 8.5*  HCT 30.3* 28.4*  PLT 378 340   BMET  Basename 10/20/11 0530 10/18/11 0514  NA 141 138  K 3.6 3.8  CL 102 99  CO2 30 27  GLUCOSE 99 110*  BUN 10 8  CREATININE 0.76 0.97  CALCIUM 8.8 8.8   LFT No results found for this basename: PROT,ALBUMIN,AST,ALT,ALKPHOS,BILITOT,BILIDIR,IBILI,LIPASE in the last 72 hours PT/INR  Basename 10/20/11 0530 10/19/11 0520  LABPROT 18.4* 18.9*  INR 1.50* 1.55*     Studies/Results: No results found.  Assessment/Plan: Diverticulitis with abscess. S/p perc drain 7/23 Cx results noted. Ouput change concerning for fistula. WBC down.  Rec repeat CT +/- drain injection as output trends down. Cont to follow along.    LOS: 5 days    Brayton El PA-C 10/20/2011 8:47 AM

## 2011-10-20 NOTE — Progress Notes (Signed)
Intestinal diverticular abscess  Subjective: She doesn't feel like she is making progress but hasn't had any pain. She is tolerating a solid diet. No nausea. Bowels are working okay. She is discouraged that she is not improved more than she has.  Objective: Vital signs in last 24 hours: Temp:  [98.3 F (36.8 C)-98.8 F (37.1 C)] 98.3 F (36.8 C) (07/27 0555) Pulse Rate:  [66-80] 75  (07/27 0555) Resp:  [18-20] 18  (07/27 0555) BP: (116-169)/(57-102) 124/66 mmHg (07/27 0644) SpO2:  [93 %-99 %] 93 % (07/27 0555) Last BM Date: 10/19/11  Intake/Output from previous day: 07/26 0701 - 07/27 0700 In: 586 [P.O.:576] Out: 45 [Drains:45] Intake/Output this shift: Total I/O In: 240 [P.O.:240] Out: -   General appearance: alert, appears older than stated age and no distress GI: soft, non-tender; bowel sounds normal; no masses,  no organomegaly and not very much drainage but does look feculent  Lab Results:  Results for orders placed during the hospital encounter of 10/15/11 (from the past 24 hour(s))  PROTIME-INR     Status: Abnormal   Collection Time   10/20/11  5:30 AM      Component Value Range   Prothrombin Time 18.4 (*) 11.6 - 15.2 seconds   INR 1.50 (*) 0.00 - 1.49  BASIC METABOLIC PANEL     Status: Abnormal   Collection Time   10/20/11  5:30 AM      Component Value Range   Sodium 141  135 - 145 mEq/L   Potassium 3.6  3.5 - 5.1 mEq/L   Chloride 102  96 - 112 mEq/L   CO2 30  19 - 32 mEq/L   Glucose, Bld 99  70 - 99 mg/dL   BUN 10  6 - 23 mg/dL   Creatinine, Ser 1.61  0.50 - 1.10 mg/dL   Calcium 8.8  8.4 - 09.6 mg/dL   GFR calc non Af Amer 72 (*) >90 mL/min   GFR calc Af Amer 84 (*) >90 mL/min  CBC     Status: Abnormal   Collection Time   10/20/11  5:30 AM      Component Value Range   WBC 10.2  4.0 - 10.5 K/uL   RBC 3.82 (*) 3.87 - 5.11 MIL/uL   Hemoglobin 9.1 (*) 12.0 - 15.0 g/dL   HCT 04.5 (*) 40.9 - 81.1 %   MCV 79.3  78.0 - 100.0 fL   MCH 23.8 (*) 26.0 - 34.0 pg     MCHC 30.0  30.0 - 36.0 g/dL   RDW 91.4 (*) 78.2 - 95.6 %   Platelets 378  150 - 400 K/uL     Studies/Results Radiology     MEDS, Scheduled    . antiseptic oral rinse  15 mL Mouth Rinse BID  . atorvastatin  10 mg Oral q1800  . ertapenem  1 g Intravenous Q24H  . famotidine  20 mg Oral QHS  . ferrous sulfate  325 mg Oral BID WC  . levothyroxine  88 mcg Oral QAC breakfast  . mupirocin ointment   Nasal BID  . olopatadine  1 drop Both Eyes BID  . saccharomyces boulardii  250 mg Oral BID  . tiotropium  18 mcg Inhalation Daily  . vancomycin  125 mg Oral QID  . vitamin C  250 mg Oral BID  . warfarin  7.5 mg Oral ONCE-1800  . Warfarin - Pharmacist Dosing Inpatient   Does not apply q1800     Assessment: Intestinal diverticular  abscess Stable, likely is going to have colocutaneous fistula via the drain.  Plan: Continue current management. With her recent C. Difficile colitis operative intervention at this point is not appropriate in lesser condition deteriorates significantly. She is very anxious to proceed to surgery at some point in time but does not want to have a colostomy. She thinks that she does have a "temporary" colostomy will never be reversed due to her age and other health issues.  LOS: 5 days    Currie Paris, MD, [facility], [place] [phone]   10/20/2011 11:19 AM

## 2011-10-20 NOTE — Progress Notes (Signed)
ANTICOAGULATION CONSULT NOTE - Follow Up Consult  Pharmacy Consult for Coumadin  Indication: atrial fibrillation  Allergies  Allergen Reactions  . Cortisone Other (See Comments)    Unknown   . Minocin (Minocycline Hcl) Other (See Comments)    Unknown  . Minocycline Hcl   . Neomycin-Bacitracin Zn-Polymyx   . Neosporin (Neomycin-Bacitracin Zn-Polymyx) Other (See Comments)    Unknown  . Other Other (See Comments)    Steroidal Neuromuscular Blockers  . Tetanus Toxoids Other (See Comments)    Unknown    Patient Measurements: Height: 5\' 4"  (162.6 cm) Weight: 160 lb 11.2 oz (72.893 kg) IBW/kg (Calculated) : 54.7   Vital Signs: Temp: 98.3 F (36.8 C) (07/27 0555) Temp src: Oral (07/27 0555) BP: 124/66 mmHg (07/27 0644) Pulse Rate: 75  (07/27 0555)  Labs:  Basename 10/20/11 0530 10/19/11 0520 10/18/11 0514  HGB 9.1* 8.5* --  HCT 30.3* 28.4* 30.0*  PLT 378 340 345  APTT -- -- --  LABPROT 18.4* 18.9* 19.4*  INR 1.50* 1.55* 1.61*  HEPARINUNFRC -- -- --  CREATININE 0.76 -- 0.97  CKTOTAL -- -- --  CKMB -- -- --  TROPONINI -- -- --    Estimated Creatinine Clearance: 45.7 ml/min (by C-G formula based on Cr of 0.76).   Medications:  Scheduled:    . antiseptic oral rinse  15 mL Mouth Rinse BID  . atorvastatin  10 mg Oral q1800  . ertapenem  1 g Intravenous Q24H  . famotidine  20 mg Oral QHS  . ferrous sulfate  325 mg Oral BID WC  . levothyroxine  88 mcg Oral QAC breakfast  . mupirocin ointment   Nasal BID  . olopatadine  1 drop Both Eyes BID  . saccharomyces boulardii  250 mg Oral BID  . tiotropium  18 mcg Inhalation Daily  . vancomycin  125 mg Oral QID  . vitamin C  250 mg Oral BID  . warfarin  7.5 mg Oral ONCE-1800  . Warfarin - Pharmacist Dosing Inpatient   Does not apply q1800    Assessment: 76 YOF admitted 7/22 with diverticular abscess. Pharmacy consulted to dose Coumadin for history of A. Fib. Patient's INR is subtherapeutic today at 1.50. H&H is low but  stable. Platelets are within normal limits. Patient does have hemorrhoids and there are reports of a small amount of bleeding with passing stools. Patient's INR has decreased despite increasing her Coumadin dose.  There may be a question with absorption secondary to diverticular abscess and possible fistula, however the patient is eating and having bowel movements.  Will increase dose to 10 mg tonight to try to get INR moving. May have to consider IV Coumadin if INR doesn't start to trend up.  Goal of Therapy:  INR 2-3 Monitor platelets by anticoagulation protocol: Yes   Plan:  1.  Coumadin 10 mg at 1800. 2.  F/U daily PT/INR 3.  Monitor H/H, platelets, and for bleeding   Lillia Pauls, PharmD Clinical Pharmacist Pager: [phone] Phone: [phone] 10/20/2011 11:03 AM

## 2011-10-20 NOTE — Progress Notes (Addendum)
CC: Abdominal pain.   History of Present Illness:  [name] is a 78 y.o. female hospitalized from 09/15/11 to 09/21/11 with diverticulitis. She had persistent abdominal pain and constipation. CT abd/pelvis 10/15/11 showed diverticular abscess and she was admitted to hospital.  Ms. [name] is followed in the pulmonary office for COPD, chronic hypoxic respiratory failure, and lung nodule.   Abx: Cipro 7/22>>7/24 Flagyl 7/22>>7/24 Augmentin 7/24>>7/25 Invanz 7/25>> PO Vancomycin 7/25>>  Cx: Abdominal abscess 7/23>>Klebseialla pneumoniae, Microaerophilic streptococcus C diff PCR 7/25>>POSITIVE  Events: 7/23 Abdominal pig-tail catheter inserted by IR 7/24 Change to po Abx 7/25 Fever, increased WBC>>change back to IV Abx  Subjective:  Decreased abd pain.  Tolerating diet. Finds drain "irritating"- tedious. She will address with surgeons.  Breathing at baseline. She notes always feeling not quite warm enough- blankets arranged.  Vitals: Filed Vitals:   10/19/11 1305 10/19/11 2100 10/20/11 0555 10/20/11 0644  BP: 116/57 159/82 169/102 124/66  Pulse: 66 80 75   Temp: 98.8 F (37.1 C) 98.6 F (37 C) 98.3 F (36.8 C)   TempSrc: Oral Oral Oral   Resp: 18 20 18    Height:      Weight:      SpO2: 99% 98% 93%     Intake/Output Summary (Last 24 hours) at 10/20/11 0817 Last data filed at 10/20/11 0600  Gross per 24 hour  Intake    586 ml  Output     45 ml  Net    541 ml    Physical Exam: General - Appears comfortable, lying left decubitus. ENT - no sinus tenderness , speech is clear Cardiac - s1s2 regular, 3/6 SM  Chest - prolonged exhalation, no wheeze/rales, diminished breath sounds. Abd - soft, non-tender, no rebound/guarding, + bowel sounds  Ext - no edema  Neuro - normal strength  Skin - no rash  Psych - normal mood, and behavior.  No results found. Lab Results  Component Value Date   CREATININE 0.76 10/20/2011   BUN 10 10/20/2011   NA 141 10/20/2011   K  3.6 10/20/2011   CL 102 10/20/2011   CO2 30 10/20/2011    Lab Results  Component Value Date   WBC 10.2 10/20/2011   HGB 9.1* 10/20/2011   HCT 30.3* 10/20/2011   MCV 79.3 10/20/2011   PLT 378 10/20/2011   Assessment/Plan:  A: Diverticular abscess s/p drain insertion 7/23 by IR.  Klebsiella and Microaerophilic streptococcus in abscess culture. P:  Low fiber diet Appreciate help from IR and general surgery Defer timing of f/u CT abd to CCS- IR PA note reviewed. D5/x current on invanz  PRN low dose morphine for pain control  Tylenol prn for fever  A: C diff colitis P: D2/14/x oral vancomycin Add florastor D/c protonix and change to pepcid  A: Hx of COPD  P:  Continue spiriva  PRN nebulizer therapy   A: Chronic hypoxic respiratory failure  P:  Supplemental oxygen to keep SpO2 > 92%   A: Hx of A fib, HTN, CHF, CAD, Aortic stenosis  P:  Continue coumadin 5 mg daily and adjust per pharmacy Continue lipitor 10 mg daily  A: Chronic kidney disease  P:  F/u renal fx intermittently, monitor urine outpt   A: Hypothyroidism  P:  Continue synthroid  A: Anemia 2nd to iron deficiency P:  Continue FeSO4 with Vitamin C F/u CBC  Transfuse for Hb < 7  C.D. Maple Hudson, MD [facility] Pulmonary/Critical Care 10/20/2011, 8:17 AM

## 2011-10-21 DIAGNOSIS — A0472 Enterocolitis due to Clostridium difficile, not specified as recurrent: Secondary | ICD-10-CM

## 2011-10-21 LAB — ANAEROBIC CULTURE

## 2011-10-21 LAB — PROTIME-INR: Prothrombin Time: 23.2 seconds — ABNORMAL HIGH (ref 11.6–15.2)

## 2011-10-21 MED ORDER — WHITE PETROLATUM GEL
Status: AC
  Filled 2011-10-21: qty 5

## 2011-10-21 MED ORDER — DIPHENHYDRAMINE HCL 25 MG PO CAPS
25.0000 mg | ORAL_CAPSULE | ORAL | Status: DC | PRN
  Administered 2011-10-21: 25 mg via ORAL
  Filled 2011-10-21 (×2): qty 1

## 2011-10-21 MED ORDER — WARFARIN SODIUM 5 MG PO TABS
5.0000 mg | ORAL_TABLET | Freq: Once | ORAL | Status: AC
  Administered 2011-10-21: 5 mg via ORAL
  Filled 2011-10-21: qty 1

## 2011-10-21 NOTE — Progress Notes (Signed)
ANTICOAGULATION CONSULT NOTE - Follow Up Consult  Pharmacy Consult for Coumadin Indication: atrial fibrillation  Allergies  Allergen Reactions  . Cortisone Other (See Comments)    Unknown   . Minocin (Minocycline Hcl) Other (See Comments)    Unknown  . Minocycline Hcl   . Neomycin-Bacitracin Zn-Polymyx   . Neosporin (Neomycin-Bacitracin Zn-Polymyx) Other (See Comments)    Unknown  . Other Other (See Comments)    Steroidal Neuromuscular Blockers  . Tetanus Toxoids Other (See Comments)    Unknown    Patient Measurements: Height: 5\' 4"  (162.6 cm) Weight: 160 lb 11.2 oz (72.893 kg) IBW/kg (Calculated) : 54.7   Vital Signs: Temp: 98.5 F (36.9 C) (07/28 0504) Temp src: Oral (07/28 0504) BP: 162/71 mmHg (07/28 0504) Pulse Rate: 72  (07/28 0504)  Labs:  Basename 10/21/11 0550 10/20/11 0530 10/19/11 0520  HGB -- 9.1* 8.5*  HCT -- 30.3* 28.4*  PLT -- 378 340  APTT -- -- --  LABPROT 23.2* 18.4* 18.9*  INR 2.02* 1.50* 1.55*  HEPARINUNFRC -- -- --  CREATININE -- 0.76 --  CKTOTAL -- -- --  CKMB -- -- --  TROPONINI -- -- --    Estimated Creatinine Clearance: 45.7 ml/min (by C-G formula based on Cr of 0.76).  Assessment:   INR now low therapeutic.  Home Coumadin dose had been 5 mg daily, but has had 7.5 mg->7.5 mg->10 mg over the last 3 days, so expect INR to rise some by am. Scant amount of blood noted with BM, known hemorrhoids.  Refusing oral iron supplement the last few days.   Day # 4 Ertapenem  Day # 3/14 oral Vancomycin  Day # 4 nasal Mupirocin  Goal of Therapy:  INR 2-3 Monitor platelets by anticoagulation protocol: Yes   Plan:   Coumadin 5 mg today.  Continue daily PT/INR.  CBC in am.  Will follow-up for any further bleeding/blood in stools.    Dennie Fetters, RPh Pager: [phone] 10/21/2011,9:40 AM

## 2011-10-21 NOTE — Progress Notes (Signed)
CC: Abdominal pain.   History of Present Illness:  [name] is a 78 y.o. female hospitalized from 09/15/11 to 09/21/11 with diverticulitis. She had persistent abdominal pain and constipation. CT abd/pelvis 10/15/11 showed diverticular abscess and she was admitted to hospital.  [name] is followed in the pulmonary office for COPD, chronic hypoxic respiratory failure, and lung nodule.   Abx: Cipro 7/22>>7/24 Flagyl 7/22>>7/24 Augmentin 7/24>>7/25 Invanz 7/25>> PO Vancomycin 7/25>>  Cx: Abdominal abscess 7/23>>Klebseialla pneumoniae, Microaerophilic streptococcus C diff PCR 7/25>>POSITIVE  Events: 7/23 Abdominal pig-tail catheter inserted by IR 7/24 Change to po Abx 7/25 Fever, increased WBC>>change back to IV Abx  Subjective:  Decreased abd pain.  Tolerating diet.  She will address with surgeons.  Breathing at baseline. No respiratory symptoms. She is frustrated and impatient about her GI progress.  Vitals: Filed Vitals:   10/20/11 1200 10/20/11 1300 10/20/11 2111 10/21/11 0504  BP:  119/57 176/72 162/71  Pulse:  71 78 72  Temp:  98.5 F (36.9 C) 98.7 F (37.1 C) 98.5 F (36.9 C)  TempSrc:  Oral Oral Oral  Resp:  18 17 17   Height:      Weight:      SpO2: 93% 92% 97% 92%    Intake/Output Summary (Last 24 hours) at 10/21/11 0833 Last data filed at 10/21/11 0553  Gross per 24 hour  Intake    490 ml  Output     10 ml  Net    480 ml    Physical Exam: General - Appears comfortable, lying left decubitus. ENT -  speech is clear Cardiac - s1s2 regular, 3/6 SM Aortic space into neck Chest - prolonged exhalation, no wheeze/rales, diminished breath sounds. Abd -  + bowel sounds  Ext - no edema  Neuro - normal strength  Skin - no rash  Psych - normal mood, and behavior.  No results found. Lab Results  Component Value Date   CREATININE 0.76 10/20/2011   BUN 10 10/20/2011   NA 141 10/20/2011   K 3.6 10/20/2011   CL 102 10/20/2011   CO2 30 10/20/2011     Lab Results  Component Value Date   WBC 10.2 10/20/2011   HGB 9.1* 10/20/2011   HCT 30.3* 10/20/2011   MCV 79.3 10/20/2011   PLT 378 10/20/2011   Assessment/Plan:  A: Diverticular abscess s/p drain insertion 7/23 by IR.  Klebsiella and Microaerophilic streptococcus in abscess culture. P:  Low fiber diet Appreciate help from IR and general surgery Defer timing of f/u CT abd to C[facility]- IR PA note reviewed. D5/x current on invanz  PRN low dose morphine for pain control  Tylenol prn for fever  A: C diff colitis P: D 3/14/x oral vancomycin Add florastor D/c protonix and change to pepcid  A: Hx of COPD  P:  Continue spiriva  PRN nebulizer therapy   A: Chronic hypoxic respiratory failure  P:  Supplemental oxygen to keep SpO2 > 92%   A: Hx of A fib, HTN, CHF, CAD, Aortic stenosis  P:  Continue coumadin 5 mg daily and adjust per pharmacy Continue lipitor 10 mg daily  A: Chronic kidney disease  P:  F/u renal fx intermittently, monitor urine outpt   A: Hypothyroidism  P:  Continue synthroid  A: Anemia 2nd to iron deficiency  Notes blood in stool P:  Continue FeSO4 with Vitamin C F/u CBC  Transfuse for Hb < 7  C.D. Maple Hudson, MD [facility] Pulmonary/Critical Care 10/21/2011, 8:33 AM

## 2011-10-21 NOTE — Progress Notes (Signed)
  Subjective: Had some bright blood per rectum now resolved Abdominal pain improved  Objective: Vital signs in last 24 hours: Temp:  [98.5 F (36.9 C)-98.7 F (37.1 C)] 98.5 F (36.9 C) (07/28 0504) Pulse Rate:  [71-78] 72  (07/28 0504) Resp:  [17-18] 17  (07/28 0504) BP: (119-176)/(57-72) 162/71 mmHg (07/28 0504) SpO2:  [92 %-97 %] 92 % (07/28 0504) Last BM Date: 10/19/11  Intake/Output from previous day: 07/27 0701 - 07/28 0700 In: 490 [P.O.:480] Out: 10 [Drains:10] Intake/Output this shift:    Comfortable in appearance Abdomen soft, with no tenderness currently Drain serosang  Lab Results:   Basename 10/20/11 0530 10/19/11 0520  WBC 10.2 14.1*  HGB 9.1* 8.5*  HCT 30.3* 28.4*  PLT 378 340   BMET  Basename 10/20/11 0530  NA 141  K 3.6  CL 102  CO2 30  GLUCOSE 99  BUN 10  CREATININE 0.76  CALCIUM 8.8   PT/INR  Basename 10/21/11 0550 10/20/11 0530  LABPROT 23.2* 18.4*  INR 2.02* 1.50*   ABG No results found for this basename: PHART:2,PCO2:2,PO2:2,HCO3:2 in the last 72 hours  Studies/Results: No results found.  Anti-infectives: Anti-infectives     Start     Dose/Rate Route Frequency Ordered Stop   10/18/11 1800   vancomycin (VANCOCIN) 50 mg/mL oral solution 125 mg        125 mg Oral 4 times daily 10/18/11 1732 11/01/11 1759   10/18/11 0800   ertapenem (INVANZ) 1 g in sodium chloride 0.9 % 50 mL IVPB        1 g 100 mL/hr over 30 Minutes Intravenous Every 24 hours 10/18/11 0753     10/17/11 1400   amoxicillin-clavulanate (AUGMENTIN) 875-125 MG per tablet 1 tablet  Status:  Discontinued        1 tablet Oral Every 12 hours 10/17/11 1255 10/18/11 0842   10/15/11 1800   ciprofloxacin (CIPRO) IVPB 400 mg  Status:  Discontinued        400 mg 200 mL/hr over 60 Minutes Intravenous Every 12 hours 10/15/11 1639 10/17/11 1254   10/15/11 1800   metroNIDAZOLE (FLAGYL) IVPB 500 mg  Status:  Discontinued        500 mg 100 mL/hr over 60 Minutes Intravenous  Every 8 hours 10/15/11 1639 10/17/11 1254          Assessment/Plan: s/p * No surgery found *  Diverticulitis with abscess C.diff colitis  Continue current care, drain, antibiotics Repeat CT soon Suspect blood hemorrhoidal in nature  LOS: 6 days    [name] A 10/21/2011

## 2011-10-21 NOTE — Progress Notes (Signed)
Edema noted to patient's lips, particularly the right bottom lip. This has worsened over the morning. MD made aware, order received. Will monitor. Lurline Idol [facility]

## 2011-10-22 ENCOUNTER — Inpatient Hospital Stay (HOSPITAL_COMMUNITY): Payer: Medicare Other

## 2011-10-22 DIAGNOSIS — R911 Solitary pulmonary nodule: Secondary | ICD-10-CM

## 2011-10-22 LAB — CBC
HCT: 30.7 % — ABNORMAL LOW (ref 36.0–46.0)
Hemoglobin: 9.2 g/dL — ABNORMAL LOW (ref 12.0–15.0)
MCH: 23.5 pg — ABNORMAL LOW (ref 26.0–34.0)
MCHC: 30 g/dL (ref 30.0–36.0)
MCV: 78.5 fL (ref 78.0–100.0)

## 2011-10-22 MED ORDER — IOHEXOL 300 MG/ML  SOLN
80.0000 mL | Freq: Once | INTRAMUSCULAR | Status: AC | PRN
  Administered 2011-10-22: 80 mL via INTRAVENOUS

## 2011-10-22 MED ORDER — IOHEXOL 300 MG/ML  SOLN
20.0000 mL | INTRAMUSCULAR | Status: AC
  Administered 2011-10-22 (×2): 20 mL via ORAL

## 2011-10-22 MED ORDER — WARFARIN SODIUM 5 MG PO TABS
5.0000 mg | ORAL_TABLET | Freq: Once | ORAL | Status: AC
  Administered 2011-10-22: 5 mg via ORAL
  Filled 2011-10-22: qty 1

## 2011-10-22 NOTE — Progress Notes (Signed)
[place] P[facility]   History of Present Illness:  [name] is a 78 y.o. female hospitalized from 09/15/11 to 09/21/11 with diverticulitis. She had persistent abdominal pain and constipation. CT abd/pelvis 10/15/11 showed diverticular abscess and she was admitted to hospital.  [name] is followed in the pulmonary office for COPD, chronic hypoxic respiratory failure, and lung nodule.   Abx: Cipro 7/22>>7/24 Flagyl 7/22>>7/24 Augmentin 7/24>>7/25 Invanz 7/25>> PO Vancomycin 7/25>>  Cx: Abdominal abscess 7/23>>Klebseialla pneumoniae, Microaerophilic streptococcus C diff PCR 7/25>>POSITIVE  Events: 7/23 Abdominal pig-tail catheter inserted by IR 7/24 Change to po Abx 7/25 Fever, increased WBC>>change back to IV Abx  Subjective:  Repeat CT scan abd today.  No acute change.    Vitals: Filed Vitals:   10/21/11 1500 10/21/11 2115 10/22/11 0550 10/22/11 0813  BP: 116/58 136/65 163/78   Pulse: 68 61 76   Temp: 98.3 F (36.8 C) 98.6 F (37 C) 98.5 F (36.9 C)   TempSrc: Oral Oral Oral   Resp: 18 18 18    Height:      Weight:      SpO2: 93% 96% 97% 95%    Intake/Output Summary (Last 24 hours) at 10/22/11 1034 Last data filed at 10/22/11 0900  Gross per 24 hour  Intake    360 ml  Output      0 ml  Net    360 ml    Physical Exam: General - Appears comfortable, lying left decubitus. ENT -  speech is clear Cardiac - s1s2 regular, 3/6 SM Aortic space into neck Chest - prolonged exhalation, no wheeze/rales, diminished breath sounds. Abd -  + bowel sounds  Ext - no edema  Neuro - normal strength  Skin - no rash  Psych - normal mood, and behavior.  No results found. Lab Results  Component Value Date   CREATININE 0.76 10/20/2011   BUN 10 10/20/2011   NA 141 10/20/2011   K 3.6 10/20/2011   CL 102 10/20/2011   CO2 30 10/20/2011    Lab Results  Component Value Date   WBC 10.5 10/22/2011   HGB 9.2* 10/22/2011   HCT 30.7* 10/22/2011   MCV 78.5 10/22/2011   PLT  437* 10/22/2011   Assessment/Plan:  A: Diverticular abscess s/p drain insertion 7/23 by IR.  Klebsiella and Microaerophilic streptococcus in abscess culture. P:  Low fiber diet Appreciate help from IR and general surgery For f/u abd CT today  abx as above  Cont drain  PRN low dose morphine for pain control  Tylenol prn for fever  A: C diff colitis P: oral vancomycin as above  Add florastor Cont pepcid v protonix  A: Hx of COPD  P:  Continue spiriva  PRN nebulizer therapy   A: Chronic hypoxic respiratory failure  P:  Supplemental oxygen to keep SpO2 > 92%   A: Hx of A fib, HTN, CHF, CAD, Aortic stenosis  P:  Continue coumadin 5 mg daily and adjust per pharmacy Continue lipitor 10 mg daily BP creeping up - may need additional rx.  Hold for now   A: Chronic kidney disease  P:  F/u renal fx intermittently, monitor urine outpt   A: Hypothyroidism  P:  Continue synthroid  A: Anemia 2nd to iron deficiency  Notes blood in stool P:  Continue FeSO4 with Vitamin C F/u CBC  Transfuse for Hb < 7   Will ask [facility] to assume care 7/30.      Danford Bad, NP 10/22/2011  10:35 AM Pager: [phone]  or [phone]  *Care during the described time interval was provided by me and/or other providers on the critical care team. I have reviewed this patient's available data, including medical history, events of note, physical examination and test results as part of my evaluation.  Patient seen and examined, agree with above note.  I dictated the care and orders written for this patient under my direction.  Koren Bound, M.D. [phone]

## 2011-10-22 NOTE — Progress Notes (Signed)
ANTICOAGULATION CONSULT NOTE - Follow Up Consult  Pharmacy Consult for Coumadin Indication: atrial fibrillation  Allergies  Allergen Reactions  . Cortisone Other (See Comments)    Unknown   . Minocin (Minocycline Hcl) Other (See Comments)    Unknown  . Minocycline Hcl   . Neomycin-Bacitracin Zn-Polymyx   . Neosporin (Neomycin-Bacitracin Zn-Polymyx) Other (See Comments)    Unknown  . Other Other (See Comments)    Steroidal Neuromuscular Blockers  . Tetanus Toxoids Other (See Comments)    Unknown    Patient Measurements: Height: 5\' 4"  (162.6 cm) Weight: 160 lb 11.2 oz (72.893 kg) IBW/kg (Calculated) : 54.7   Vital Signs: Temp: 98.5 F (36.9 C) (07/29 0550) Temp src: Oral (07/29 0550) BP: 163/78 mmHg (07/29 0550) Pulse Rate: 76  (07/29 0550)  Labs:  Basename 10/22/11 0800 10/22/11 0515 10/21/11 0550 10/20/11 0530  HGB -- 9.2* -- 9.1*  HCT -- 30.7* -- 30.3*  PLT -- 437* -- 378  APTT -- -- -- --  LABPROT 28.1* -- 23.2* 18.4*  INR 2.58* -- 2.02* 1.50*  HEPARINUNFRC -- -- -- --  CREATININE -- -- -- 0.76  CKTOTAL -- -- -- --  CKMB -- -- -- --  TROPONINI -- -- -- --    Estimated Creatinine Clearance: 45.7 ml/min (by C-G formula based on Cr of 0.76).    Assessment: Patient is on Coumadin for history of AFib and INR is now therapeutic at 2.58.  Hemorrhoidal bleeding seems resolved per MD.  Refusing oral iron supplement the last few days. H&H low, stable.    Goal of Therapy:  INR 2-3 Monitor platelets by anticoagulation protocol: Yes    Plan:   Coumadin 5 mg today.  Continue daily PT/INR.  Continue to watch hemorrhoidal bleeding.     Doris Cheadle, PharmD Clinical Pharmacist Pager: [phone] Phone: [phone] 10/22/2011 9:56 AM    Chelsea Aus D. Laney Potash, PharmD, BCPS Pager:  [phone] 10/22/2011, 10:29 AM

## 2011-10-22 NOTE — Progress Notes (Signed)
Patient interviewed and examined, agree with PA note above.  Mariella Saa MD, FACS  10/22/2011 9:22 AM

## 2011-10-22 NOTE — Progress Notes (Signed)
Patient ID: [name], female   DOB: 05-26-1919, 78 y.o.   MRN: [id number]    Subjective: Sitting up at bedside eating breakfast, query as to what plan is? No further blood from rectum. No abdominal pain reported by patient. Objective: Vital signs in last 24 hours: Temp:  [98.3 F (36.8 C)-98.6 F (37 C)] 98.5 F (36.9 C) (07/29 0550) Pulse Rate:  [61-76] 76  (07/29 0550) Resp:  [18] 18  (07/29 0550) BP: (116-163)/(58-78) 163/78 mmHg (07/29 0550) SpO2:  [93 %-97 %] 97 % (07/29 0550) Last BM Date: 10/19/11  Intake/Output from previous day: 07/28 0701 - 07/29 0700 In: 480 [P.O.:480] Out: -  Intake/Output this shift:   General appearance: A/A/O Comfortable in appearance Abdomen soft, with no tenderness currently Drain serosang; less than 5 cc in JP presently (10 ml in past 24 hrs) VSS, afebrile.  Lab Results:   Basename 10/22/11 0515 10/20/11 0530  WBC 10.5 10.2  HGB 9.2* 9.1*  HCT 30.7* 30.3*  PLT 437* 378   BMET  Basename 10/20/11 0530  NA 141  K 3.6  CL 102  CO2 30  GLUCOSE 99  BUN 10  CREATININE 0.76  CALCIUM 8.8   PT/INR  Basename 10/21/11 0550 10/20/11 0530  LABPROT 23.2* 18.4*  INR 2.02* 1.50*   ABG No results found for this basename: PHART:2,PCO2:2,PO2:2,HCO3:2 in the last 72 hours  Studies/Results: No results found.  Anti-infectives: Anti-infectives     Start     Dose/Rate Route Frequency Ordered Stop   10/18/11 1800   vancomycin (VANCOCIN) 50 mg/mL oral solution 125 mg        125 mg Oral 4 times daily 10/18/11 1732 11/01/11 1759   10/18/11 0800   ertapenem (INVANZ) 1 g in sodium chloride 0.9 % 50 mL IVPB        1 g 100 mL/hr over 30 Minutes Intravenous Every 24 hours 10/18/11 0753     10/17/11 1400   amoxicillin-clavulanate (AUGMENTIN) 875-125 MG per tablet 1 tablet  Status:  Discontinued        1 tablet Oral Every 12 hours 10/17/11 1255 10/18/11 0842   10/15/11 1800   ciprofloxacin (CIPRO) IVPB 400 mg  Status:  Discontinued       400 mg 200 mL/hr over 60 Minutes Intravenous Every 12 hours 10/15/11 1639 10/17/11 1254   10/15/11 1800   metroNIDAZOLE (FLAGYL) IVPB 500 mg  Status:  Discontinued        500 mg 100 mL/hr over 60 Minutes Intravenous Every 8 hours 10/15/11 1639 10/17/11 1254          Assessment/Plan: s/p * No surgery found *  Diverticulitis with abscess C.diff colitis  Continue current care, drain, antibiotics. Repeat CT today Blood was likley hemorrhoidal in nature (appears resolved)   LOS: 7 days    Faye Strohman 10/22/2011

## 2011-10-22 NOTE — Progress Notes (Signed)
Patient has had blood from rectum after voiding and bm's, patient has external hemrhoids.  Will continue to monitor.  Macarthur Critchley, RN

## 2011-10-22 NOTE — Progress Notes (Signed)
Clinical Social Worker continuing to follow for discharge planning. Per chart, pt not yet medically ready for discharge. Clinical Social Worker notified [facility]. Clinical Social Worker to facilitate pt discharge needs when pt medically ready for discharge.  Jacklynn Lewis, MSW, LCSWA  Clinical Social Work [phone]

## 2011-10-22 NOTE — Progress Notes (Signed)
Subjective: Pt ok. C/o mild soreness at drain site and itching. Is tolerating diet and stools more formed.   Objective: Physical Exam: BP 163/78  Pulse 76  Temp 98.5 F (36.9 C) (Oral)  Resp 18  Ht 5\' 4"  (1.626 m)  Wt 160 lb 11.2 oz (72.893 kg)  BMI 27.58 kg/m2  SpO2 95% Drain intact, site clean, new dressing in place. No swelling or erythema at drain site. Output more serosanguinous today, not feculent. Only 10cc out yesterday.  Labs: CBC  Basename 10/22/11 0515 10/20/11 0530  WBC 10.5 10.2  HGB 9.2* 9.1*  HCT 30.7* 30.3*  PLT 437* 378   BMET  Basename 10/20/11 0530  NA 141  K 3.6  CL 102  CO2 30  GLUCOSE 99  BUN 10  CREATININE 0.76  CALCIUM 8.8   LFT No results found for this basename: PROT,ALBUMIN,AST,ALT,ALKPHOS,BILITOT,BILIDIR,IBILI,LIPASE in the last 72 hours PT/INR  Basename 10/22/11 0800 10/21/11 0550  LABPROT 28.1* 23.2*  INR 2.58* 2.02*     Studies/Results: No results found.  Assessment/Plan: Diverticulitis with abscess. S/p perc drain 7/23 Cx results noted. WBC normal X 48hrs. Repeat CT ordered for today, rec drain injection study if abscess resolved on CT. Cont to follow along.    LOS: 7 days    Brayton El PA-C 10/22/2011 10:11 AM

## 2011-10-22 NOTE — Progress Notes (Signed)
PT Cancellation Note  Treatment cancelled today due to pt out of room for test/procedure.  Delaney Meigs, PT [phone]

## 2011-10-23 LAB — PROTIME-INR: Prothrombin Time: 31.1 seconds — ABNORMAL HIGH (ref 11.6–15.2)

## 2011-10-23 MED ORDER — CIPROFLOXACIN HCL 500 MG PO TABS
500.0000 mg | ORAL_TABLET | Freq: Two times a day (BID) | ORAL | Status: DC
  Administered 2011-10-23 – 2011-10-24 (×3): 500 mg via ORAL
  Filled 2011-10-23 (×5): qty 1

## 2011-10-23 MED ORDER — WARFARIN SODIUM 5 MG PO TABS
5.0000 mg | ORAL_TABLET | Freq: Once | ORAL | Status: AC
  Administered 2011-10-23: 5 mg via ORAL
  Filled 2011-10-23: qty 1

## 2011-10-23 NOTE — Progress Notes (Signed)
Patient interviewed and examined, agree with PA note above.  Mariella Saa MD, FACS  10/23/2011 1:47 PM

## 2011-10-23 NOTE — Progress Notes (Signed)
Patient ID: [name], female   DOB: 05/29/1919, 78 y.o.   MRN: [id number] Patient ID: [name], female   DOB: Oct 14, 1919, 78 y.o.   MRN: [id number]    Subjective: Nocomplaints offered by patient this morning. Appears comfortable. Objective: Vital signs in last 24 hours: Temp:  [98.4 F (36.9 C)-98.7 F (37.1 C)] 98.7 F (37.1 C) (07/30 0501) Pulse Rate:  [66-76] 76  (07/30 0501) Resp:  [18-20] 18  (07/30 0501) BP: (118-178)/(60-82) 178/82 mmHg (07/30 0501) SpO2:  [94 %-99 %] 96 % (07/30 0501) Last BM Date: 10/22/11  Intake/Output from previous day: 07/29 0701 - 07/30 0700 In: 695 [P.O.:440; IV Piggyback:250] Out: 20 [Drains:20] Intake/Output this shift: Total I/O In: 180 [P.O.:180] Out: -  General appearance: A/A/O Comfortable in appearance Abdomen soft, with no tenderness currently Drain serosang; less than 5 cc in JP presently (20 ml in past 24 hrs) VSS, afebrile.  Lab Results:   [facility] 10/22/11 0515  WBC 10.5  HGB 9.2*  HCT 30.7*  PLT 437*   BMET No results found for this basename: NA:2,K:2,CL:2,CO2:2,GLUCOSE:2,BUN:2,CREATININE:2,CALCIUM:2 in the last 72 hours PT/INR  Basename 10/23/11 0545 10/22/11 0800  LABPROT 31.1* 28.1*  INR 2.94* 2.58*   ABG No results found for this basename: PHART:2,PCO2:2,PO2:2,HCO3:2 in the last 72 hours  Studies/Results: Ct Abdomen Pelvis W Contrast  10/22/2011  *RADIOLOGY REPORT*  Clinical Data: History of diverticular abscess with drain in place.  CT ABDOMEN AND PELVIS WITH CONTRAST  Technique:  Multidetector CT imaging of the abdomen and pelvis was performed following the standard protocol during bolus administration of intravenous contrast.  Contrast: 80mL OMNIPAQUE IOHEXOL 300 MG/ML  SOLN  Comparison: 10/15/2011.  Findings: Lung bases show emphysema with dependent atelectasis and/or scarring, left greater than right.  Moderate hiatal hernia. Heart size normal.  No pericardial or pleural effusion.  Liver measures  20 cm, its margin is irregular and there is hypertrophy of the left hepatic lobe.   Gallbladder and adrenal glands unremarkable.  Probable small stone in the lower pole right kidney.  Sub centimeter low attenuation lesions in the kidneys are too small to characterize.  Spleen, pancreas, stomach and small bowel are unremarkable.  There is presacral edema, increased from 10/15/2011.  A percutaneous drain is seen from a posterior approach, terminating in the right anatomic pelvis, adjacent to the sigmoid colon.  Mild residual inflammatory changes around the sigmoid colon with marked improvement from 10/15/2011.  No residual organized fluid collection.  Colon is otherwise unremarkable.  No free fluid.  No pathologically enlarged lymph nodes.  Extensive atherosclerotic calcification of the arterial vasculature without abdominal aortic aneurysm.  No worrisome lytic or sclerotic lesions.  IMPRESSION:  1.  Resolving inflammatory changes of sigmoid diverticulitis. Specifically, an abscess is no longer identified.  Percutaneous drain is in place. 2.  Interval increase in presacral edema, possibly reactive to indwelling drain. 3.  Enlarged cirrhotic liver. 4.  Probable right renal stone.  Original Report Authenticated By: Reyes Ivan, M.D.    Anti-infectives: Anti-infectives     Start     Dose/Rate Route Frequency Ordered Stop   10/18/11 1800   vancomycin (VANCOCIN) 50 mg/mL oral solution 125 mg        125 mg Oral 4 times daily 10/18/11 1732 11/01/11 1759   10/18/11 0800   ertapenem (INVANZ) 1 g in sodium chloride 0.9 % 50 mL IVPB        1 g 100 mL/hr over 30 Minutes Intravenous Every 24 hours 10/18/11 0753  10/17/11 1400   amoxicillin-clavulanate (AUGMENTIN) 875-125 MG per tablet 1 tablet  Status:  Discontinued        1 tablet Oral Every 12 hours 10/17/11 1255 10/18/11 0842   10/15/11 1800   ciprofloxacin (CIPRO) IVPB 400 mg  Status:  Discontinued        400 mg 200 mL/hr over 60 Minutes Intravenous  Every 12 hours 10/15/11 1639 10/17/11 1254   10/15/11 1800   metroNIDAZOLE (FLAGYL) IVPB 500 mg  Status:  Discontinued        500 mg 100 mL/hr over 60 Minutes Intravenous Every 8 hours 10/15/11 1639 10/17/11 1254          Assessment/Plan: s/p * No surgery found *  Diverticulitis with abscess C.diff colitis  Continue current care. Will d/c jp drain. Likley discharge to rehab tomorrow.   LOS: 8 days    Phung Kotas 10/23/2011

## 2011-10-23 NOTE — Progress Notes (Signed)
Physical Therapy Treatment Patient Details Name: Purvi Ruehl MRN: [id number] DOB: July 08, 1919 Today's Date: 10/23/2011 Time: 4098-1191 PT Time Calculation (min): 21 min  PT Assessment / Plan / Recommendation Comments on Treatment Session  Pt with diverticular abscess, pelvic drain and Cdiff who continues progressing with mobility but states she still does not feel she is back to baseline for safe return to ALF and desires [facility]. Pt encouraged to continue ambulation and mobility throughout the day.     Follow Up Recommendations       Barriers to Discharge        Equipment Recommendations       Recommendations for Other Services    Frequency     Plan Discharge plan remains appropriate;Frequency remains appropriate    Precautions / Restrictions Precautions Precautions: Fall Precaution Comments: pelvic drain Restrictions Weight Bearing Restrictions: No   Pertinent Vitals/Pain No pain    Mobility  Bed Mobility Bed Mobility: Not assessed Transfers Transfers: Sit to Stand;Stand to Sit Sit to Stand: 6: Modified independent (Device/Increase time);From chair/3-in-1;From toilet Stand to Sit: 5: Supervision;To toilet;To chair/3-in-1 Details for Transfer Assistance: cueing for safety at commode but mod I at chair with armrests Ambulation/Gait Ambulation/Gait Assistance: 5: Supervision Ambulation Distance (Feet): 300 Feet Assistive device: 4-wheeled walker Ambulation/Gait Assistance Details: directional cues to return to room only, good use and positioning at RW today Gait Pattern: Step-through pattern;Decreased stride length Gait velocity: decreased Stairs: No    Exercises General Exercises - Lower Extremity Long Arc Quad: AROM;Both;20 reps;Seated Hip Flexion/Marching: AROM;Both;20 reps;Seated   PT Diagnosis:    PT Problem List:   PT Treatment Interventions:     PT Goals Acute Rehab PT Goals PT Goal: Sit to Stand - Progress: Progressing toward goal PT Goal: Stand  to Sit - Progress: Progressing toward goal PT Goal: Ambulate - Progress: Progressing toward goal  Visit Information  Last PT Received On: 10/23/11 Assistance Needed: +1    Subjective Data  Subjective: I just got up a little while ago   Cognition  Overall Cognitive Status: Appears within functional limits for tasks assessed/performed Arousal/Alertness: Awake/alert Orientation Level: Appears intact for tasks assessed Behavior During Session: [facility] for tasks performed    Balance     End of Session PT - End of Session Activity Tolerance: Patient tolerated treatment well Patient left: in chair;with call bell/phone within reach Nurse Communication: Mobility status   GP     Toney Sang Beth 10/23/2011, 9:09 AM Delaney Meigs, PT [phone]

## 2011-10-23 NOTE — Progress Notes (Signed)
Drain removed by surgery. IR signing off.  Barbee [place] [facility] 10/23/2011 11:24 AM

## 2011-10-23 NOTE — Progress Notes (Signed)
Nutrition Follow-up  Intervention:   Pt denies any needs at this time  Assessment:   Pt reports good appetite until yesterday when she drank a milk shake that made her stomach upset.   Diet Order:  Low Fiber PO intake: 25% x 2 days, previously 100%  Meds: Scheduled Meds:   . antiseptic oral rinse  15 mL Mouth Rinse BID  . atorvastatin  10 mg Oral q1800  . ciprofloxacin  500 mg Oral BID  . famotidine  20 mg Oral QHS  . ferrous sulfate  325 mg Oral BID WC  . levothyroxine  88 mcg Oral QAC breakfast  . mupirocin ointment   Nasal BID  . olopatadine  1 drop Both Eyes BID  . saccharomyces boulardii  250 mg Oral BID  . tiotropium  18 mcg Inhalation Daily  . vancomycin  125 mg Oral QID  . vitamin C  250 mg Oral BID  . warfarin  5 mg Oral ONCE-1800  . Warfarin - Pharmacist Dosing Inpatient   Does not apply q1800  . DISCONTD: ertapenem  1 g Intravenous Q24H   Continuous Infusions:  PRN Meds:.acetaminophen, albuterol, diphenhydrAMINE, fluticasone, iohexol, ipratropium, morphine injection, zolpidem  Labs:  CMP     Component Value Date/Time   NA 141 10/20/2011 0530   K 3.6 10/20/2011 0530   CL 102 10/20/2011 0530   CO2 30 10/20/2011 0530   GLUCOSE 99 10/20/2011 0530   GLUCOSE 92 02/05/2006 1419   BUN 10 10/20/2011 0530   CREATININE 0.76 10/20/2011 0530   CALCIUM 8.8 10/20/2011 0530   PROT 8.3 10/15/2011 1648   ALBUMIN 3.2* 10/15/2011 1648   AST 24 10/15/2011 1648   ALT 9 10/15/2011 1648   ALKPHOS 76 10/15/2011 1648   BILITOT 0.4 10/15/2011 1648   GFRNONAA 72* 10/20/2011 0530   GFRAA 84* 10/20/2011 0530     Intake/Output Summary (Last 24 hours) at 10/23/11 1026 Last data filed at 10/23/11 1610  Gross per 24 hour  Intake    755 ml  Output     20 ml  Net    735 ml    Weight Status:  No new weights   Re-estimated needs:  1400-1600 kcal, 70-80 gm protein   Nutrition Dx:  Inadequate oral intake now r/t poor appetite AEB 25% intake   Goal:  Diet advance, met New goal: PO intake to  return to >75% completion of meals   Monitor:  PO intake, weights    Clarene Duke RD, LDN Pager [phone] After Hours pager [phone]

## 2011-10-23 NOTE — Progress Notes (Signed)
ANTICOAGULATION CONSULT NOTE - Follow Up Consult  Pharmacy Consult for Warfarin Indication: atrial fibrillation  Allergies  Allergen Reactions  . Cortisone Other (See Comments)    Unknown   . Minocin (Minocycline Hcl) Other (See Comments)    Unknown  . Minocycline Hcl   . Neomycin-Bacitracin Zn-Polymyx   . Neosporin (Neomycin-Bacitracin Zn-Polymyx) Other (See Comments)    Unknown  . Other Other (See Comments)    Steroidal Neuromuscular Blockers  . Tetanus Toxoids Other (See Comments)    Unknown    Patient Measurements: Height: 5\' 4"  (162.6 cm) Weight: 160 lb 11.2 oz (72.893 kg) IBW/kg (Calculated) : 54.7   Vital Signs: Temp: 98.7 F (37.1 C) (07/30 0501) Temp src: Oral (07/30 0501) BP: 178/82 mmHg (07/30 0501) Pulse Rate: 76  (07/30 0501)  Labs:  Basename 10/23/11 0545 10/22/11 0800 10/22/11 0515 10/21/11 0550  HGB -- -- 9.2* --  HCT -- -- 30.7* --  PLT -- -- 437* --  APTT -- -- -- --  LABPROT 31.1* 28.1* -- 23.2*  INR 2.94* 2.58* -- 2.02*  HEPARINUNFRC -- -- -- --  CREATININE -- -- -- --  CKTOTAL -- -- -- --  CKMB -- -- -- --  TROPONINI -- -- -- --    Estimated Creatinine Clearance: 45.7 ml/min (by C-G formula based on Cr of 0.76).   Medications:  Scheduled:    . antiseptic oral rinse  15 mL Mouth Rinse BID  . atorvastatin  10 mg Oral q1800  . ciprofloxacin  500 mg Oral BID  . famotidine  20 mg Oral QHS  . ferrous sulfate  325 mg Oral BID WC  . levothyroxine  88 mcg Oral QAC breakfast  . mupirocin ointment   Nasal BID  . olopatadine  1 drop Both Eyes BID  . saccharomyces boulardii  250 mg Oral BID  . tiotropium  18 mcg Inhalation Daily  . vancomycin  125 mg Oral QID  . vitamin C  250 mg Oral BID  . warfarin  5 mg Oral ONCE-1800  . Warfarin - Pharmacist Dosing Inpatient   Does not apply q1800  . DISCONTD: ertapenem  1 g Intravenous Q24H    Assessment: Pt on warfarin for history for AFib. INR therapeutic at 2.94. H/H stable, but low. Platelets  elevated. No signs of bleeding. Hemorrhoidal bleeding resolved per MD. Patient has refused all but 1 dose of iron in past 7 days.  Goal of Therapy:  INR 2-3 Monitor platelets by anticoagulation protocol: Yes   Plan:  1. Coumadin 5mg  x1 tonight 2. Continue daily Pt/INR  3. Continue to watch for signs of bleeding  Eloisa Chokshi D. Mark Benecke, PharmD Clinical Pharmacist Pager: [phone] Phone: [phone] 10/23/2011 10:05 AM

## 2011-10-23 NOTE — Progress Notes (Addendum)
[facility] PROGRESS NOTE  [name] AVW:[id number] DOB: 1920/02/13 DOA: 10/15/2011   Assessment/Plan: Intestinal diverticular abscess (10/15/2011) -surgery following appreciate assistance, jp drain was pulled out by surgery. The patient tolerated this well. -CulturesKlebsiella sensitive  change in  to Cipro. Anaoerobic cultures negative. -Appreciate help from IR and general surgery  C. difficile colitis (10/19/2011) -currently on vancomycin, we'll have to continue vancomycin for 7 additional days after she is done with her diverticular abscess treatment. -She currently has more formed stools. Relates her abdominal pain is much improved.  COPD (04/09/2007) -stable ,Continue spiriva .  Anemia (10/16/2011) -Continue FeSO4 with Vitamin C  -Hemoglobin stable  History of atrial fibrillation aortic stenosis: -Continue Coumadin. -INR therapeutic    LOS: 8 days   Procedures: Abdominal abscess 7/23>>Klebseialla pneumoniae, Microaerophilic streptococcus 7/23 Abdominal pig-tail catheter inserted by IR    Antibiotics: Cipro 7/22>>7/24>>7.30 Flagyl 7/22>>7/24  Augmentin 7/24>>7/25  Invanz 7/25>> 7.30 PO Vancomycin 7/25>>   Interim History: [name] is a 78 y.o. female hospitalized from 09/15/11 to 09/21/11 with diverticulitis. She had persistent abdominal pain and constipation. CT abd/pelvis 10/15/11 showed diverticular abscess and she was admitted to hospital. The patient is also complaining of pain and constipation admitted by CCM treated originally with Cipro but she continues to spike fevers she was changed to [employer]. Drain was placed in the diverticular abscess, which a repeat CT scan showed resolution of the abscess with a drain was pulled on 10/23/2011. She was tolerating her diet so she was changed back to Cipro, as her original cultures showed special assistance to Cipro with no anaerobic growth. On 725 she started having diarrhea and leukocytosis a C.  difficile was checked and was positive she was changed to by mouth vancomycin. She defervesced her diarrhea resolved her leukocytosis resolved the   Subjective: No complaints tolerating diet.   Objective: Filed Vitals:   10/22/11 0813 10/22/11 1459 10/22/11 2138 10/23/11 0501  BP:  118/60 133/70 178/82  Pulse:  67 66 76  Temp:  98.4 F (36.9 C) 98.6 F (37 C) 98.7 F (37.1 C)  TempSrc:  Oral Oral Oral  Resp:  20 20 18   Height:      Weight:      SpO2: 95% 99% 94% 96%    Intake/Output Summary (Last 24 hours) at 10/23/11 0953 Last data filed at 10/23/11 8295  Gross per 24 hour  Intake    755 ml  Output     20 ml  Net    735 ml   Weight change:   Exam:  General: Alert, awake, oriented x3, in no acute distress.  Heart: Regular rate and rhythm, without murmurs, rubs, gallops.  Lungs: Good air movement, bilateral air movement.  Abdomen: Soft, mild tenderness left and lower quadrants but no rebound or guarding, nondistended, positive bowel sounds.    Data Reviewed: Basic Metabolic Panel:  Lab 10/20/11 6213 10/18/11 0514 10/17/11 0645  NA 141 138 133*  K 3.6 3.8 --  CL 102 99 96  CO2 30 27 25   GLUCOSE 99 110* 106*  BUN 10 8 7   CREATININE 0.76 0.97 0.91  CALCIUM 8.8 8.8 8.9  MG -- -- --  PHOS -- -- --   Liver Function Tests: No results found for this basename: AST:5,ALT:5,ALKPHOS:5,BILITOT:5,PROT:5,ALBUMIN:5 in the last 168 hours No results found for this basename: LIPASE:5,AMYLASE:5 in the last 168 hours No results found for this basename: AMMONIA:5 in the last 168 hours CBC:  Lab 10/22/11 0515 10/20/11 0530 10/19/11 0520 10/18/11  6962 10/17/11 0645  WBC 10.5 10.2 14.1* 20.3* 17.2*  NEUTROABS -- -- -- -- --  HGB 9.2* 9.1* 8.5* 9.1* 9.0*  HCT 30.7* 30.3* 28.4* 30.0* 30.0*  MCV 78.5 79.3 79.1 78.9 78.9  PLT 437* 378 340 345 320   Cardiac Enzymes: No results found for this basename: CKTOTAL:5,CKMB:5,CKMBINDEX:5,TROPONINI:5 in the last 168 hours BNP: No  components found with this basename: POCBNP:5 CBG: No results found for this basename: GLUCAP:5 in the last 168 hours  Recent Results (from the past 240 hour(s))  ANAEROBIC CULTURE     Status: Normal   Collection Time   10/16/11  3:28 PM      Component Value Range Status Comment   Specimen Description ABSCESS PERITONEAL   Final    Special Requests NONE   Final    Gram Stain     Final    Value: ABUNDANT WBC PRESENT,BOTH PMN AND MONONUCLEAR     NO SQUAMOUS EPITHELIAL CELLS SEEN     ABUNDANT GRAM POSITIVE COCCI IN PAIRS AND CHAINS   Culture NO ANAEROBES ISOLATED   Final    Report Status 10/21/2011 FINAL   Final   CULTURE, ROUTINE-ABSCESS     Status: Normal   Collection Time   10/16/11  3:28 PM      Component Value Range Status Comment   Specimen Description ABSCESS PERITONEAL   Final    Special Requests NONE   Final    Gram Stain     Final    Value: ABUNDANT WBC PRESENT,BOTH PMN AND MONONUCLEAR     NO SQUAMOUS EPITHELIAL CELLS SEEN     ABUNDANT GRAM POSITIVE COCCI IN PAIRS AND CHAINS   Culture     Final    Value: FEW KLEBSIELLA PNEUMONIAE     MODERATE MICROAEROPHILIC STREPTOCOCCI     Note: Standardized susceptibility testing for this organism is not available.   Report Status 10/19/2011 FINAL   Final    Organism ID, Bacteria KLEBSIELLA PNEUMONIAE   Final   CLOSTRIDIUM DIFFICILE BY PCR     Status: Abnormal   Collection Time   10/18/11  3:24 PM      Component Value Range Status Comment   C difficile by pcr POSITIVE (*) NEGATIVE Final      Studies: Ct Chest Wo Contrast  10/08/2011  *RADIOLOGY REPORT*  Clinical Data: Follow-up evaluation of pulmonary nodules.  CT CHEST WITHOUT CONTRAST  Technique:  Multidetector CT imaging of the chest was performed following the standard protocol without IV contrast.  Comparison: Chest CT 06/22/2011.  Findings:  Mediastinum: Heart size is mildly enlarged. There is no significant pericardial fluid, thickening or pericardial calcification. There is  atherosclerosis of the thoracic aorta, the great vessels of the mediastinum and the coronary arteries, including calcified atherosclerotic plaque in the left main, left anterior descending, left circumflex and right coronary arteries. Extensive calcifications of the aortic valve and mitral annulus. No pathologically enlarged mediastinal or hilar lymph nodes. Please note that accurate exclusion of hilar adenopathy is limited on noncontrast CT scans. There is a moderate - large hiatal hernia.  Lungs/Pleura: 1 cm ground-glass attenuation nodule in the lateral segment of the right middle lobe (image 39 of series 3) is similar to the prior examination (previously reported as 5 mm, but differences are likely attributable to slice selection and patient position).  Previously noted 1 cm right lower lobe nodule is no longer as nodular in appearance, and is adjacent to an area of architectural distortion and bullous change,  favored to represent an evolving area of scarring (best demonstrated on images 33 - 36 of series 3).  No other definite suspicious-appearing pulmonary nodules or masses are identified on today's examination.  There is a background of moderate centrilobular and paraseptal emphysema with areas of architectural distortion throughout the periphery of the upper lobes of the lungs bilaterally (which is likely to represent areas of post infectious inflammatory scarring).  No acute consolidative airspace disease.  Small right-sided pleural effusion is noted layering dependently.  Upper Abdomen: Extensive atherosclerosis.  Musculoskeletal: There are no aggressive appearing lytic or blastic lesions noted in the visualized portions of the skeleton.  IMPRESSION: 1.  Previously noted 1 cm pulmonary nodule in the right lower lobe is no longer as clearly defined, and is favored to represent an area of evolving post infectious/post inflammatory scarring. 2.  There is, however, a 1 cm subsolid nodule in the lateral segment  of the right middle lobe which is unchanged in retrospect compared to the prior examination.  An indolent neoplasm such as a slow-growing adenocarcinoma cannot be excluded.  Followup by CT is recommended in 12 months, with continued annual surveillance for a minimum of 3 years.  These recommendations are taken from "Recommendations for the Management of Subsolid Pulmonary Nodules Detected at CT:  A Statement from the Fleischner Society" Radiology 2013; 266:1, 304- 317.  3.  Centrilobular and paraseptal emphysema, as above. 4. Atherosclerosis, including left main and three-vessel coronary artery disease. 5. There are calcifications of the aortic valve and mitral annulus. Echocardiographic correlation for evaluation of potential valvular dysfunction may be warranted if clinically indicated. 6.  Large hiatal hernia.  Original Report Authenticated By: Florencia Reasons, M.D.   Ct Guided Abscess Drain  10/16/2011  *RADIOLOGY REPORT*  CT GUIDED ABSCESS DRAIN  Date: 10/16/2011  Clinical History: 78 year old female with perforated diverticulitis and now diverticular abscess.  She presents for percutaneous drain placement.  Procedures Performed: 1. CT guided placement of a 12-French drain  Interventional Radiologist:  Sterling Big, MD  Sedation: Moderate (conscious) sedation was used.  1.5 mg Versed, 50 mcg Fentanyl were administered intravenously.  The patient's vital signs were monitored continuously by radiology nursing throughout the procedure.  Sedation Time: 30 minutes  Fluoroscopy time:  8 seconds  PROCEDURE/FINDINGS:   Informed consent was obtained from the patient following explanation of the procedure, risks, benefits and alternatives. The patient understands, agrees and consents for the procedure. All questions were addressed. A time out was performed.  Maximal barrier sterile technique utilized including caps, mask, sterile gowns, sterile gloves, large sterile drape, hand hygiene, and betadine skin  prep.  A planning axial CT scan was performed.  The right lower quadrant diverticular abscess was identified.  A suitable skin entry site was selected and marked.  Local anesthesia of the skin underlying soft tissues was obtained by infiltration of 1% lidocaine.  Under direct CT guidance, an 18 gauge trocar needle was advanced to the skin, soft tissues, gluteal muscle and peritoneal lining into the diverticular abscess.  A small amount of frankly purulent material was aspirated.  A short stiff Amplatz wire was then advanced through the needle and coiled within the abscess cavity.  Serial dilatation of the tract was then performed and ultimately a 12 [place] multipurpose drainage catheter was advanced over the wire and coiled within the abscess cavity.  Approximately 20 ml of foul-smelling material was then aspirated.  A final axial CT image was performed confirming successful placement of the  pigtail catheter within the previously identified abscess collection. The drainage catheter was then connected to bulb suction and secured to the skin with Prolene suture and an Accustick adhesive device.  Aspirated material was sent for culture.  The patient tolerated the procedure well, there is no immediate complication.  The patient was returned to the floor in stable condition.  IMPRESSION:  Successful CT guided placement of a 12-French drain into the pelvic diverticular abscess.  Aspirated material sent for Gram stain and culture.  Signed,  Sterling Big, MD Vascular & Interventional Radiologist [facility]  Original Report Authenticated By: Threasa Beards Abdomen Pelvis W Contrast  10/22/2011  *RADIOLOGY REPORT*  Clinical Data: History of diverticular abscess with drain in place.  CT ABDOMEN AND PELVIS WITH CONTRAST  Technique:  Multidetector CT imaging of the abdomen and pelvis was performed following the standard protocol during bolus administration of intravenous contrast.  Contrast: 80mL OMNIPAQUE  IOHEXOL 300 MG/ML  SOLN  Comparison: 10/15/2011.  Findings: Lung bases show emphysema with dependent atelectasis and/or scarring, left greater than right.  Moderate hiatal hernia. Heart size normal.  No pericardial or pleural effusion.  Liver measures 20 cm, its margin is irregular and there is hypertrophy of the left hepatic lobe.   Gallbladder and adrenal glands unremarkable.  Probable small stone in the lower pole right kidney.  Sub centimeter low attenuation lesions in the kidneys are too small to characterize.  Spleen, pancreas, stomach and small bowel are unremarkable.  There is presacral edema, increased from 10/15/2011.  A percutaneous drain is seen from a posterior approach, terminating in the right anatomic pelvis, adjacent to the sigmoid colon.  Mild residual inflammatory changes around the sigmoid colon with marked improvement from 10/15/2011.  No residual organized fluid collection.  Colon is otherwise unremarkable.  No free fluid.  No pathologically enlarged lymph nodes.  Extensive atherosclerotic calcification of the arterial vasculature without abdominal aortic aneurysm.  No worrisome lytic or sclerotic lesions.  IMPRESSION:  1.  Resolving inflammatory changes of sigmoid diverticulitis. Specifically, an abscess is no longer identified.  Percutaneous drain is in place. 2.  Interval increase in presacral edema, possibly reactive to indwelling drain. 3.  Enlarged cirrhotic liver. 4.  Probable right renal stone.  Original Report Authenticated By: Reyes Ivan, M.D.   Ct Abdomen Pelvis W Contrast  10/15/2011  **ADDENDUM** CREATED: 10/15/2011 15:27:49  These results were called by telephone on 10/15/2011 at 1528 hours to Dr. Murray Hodgkins, who verbally acknowledged these results.  **END ADDENDUM** SIGNED BY: Charline Bills, M.D.   10/15/2011  *RADIOLOGY REPORT*  Clinical Data: Follow-up diverticulitis with small contained perforation, lower abdominal cramping x3 weeks  CT ABDOMEN AND PELVIS WITH  CONTRAST  Technique:  Multidetector CT imaging of the abdomen and pelvis was performed following the standard protocol during bolus administration of intravenous contrast.  Contrast: 100 ml Omnipaque-300 IV  Comparison: 09/16/2010  Findings: Paraseptal emphysematous changes in the right lower lobe.  Moderate hiatal hernia.  Mildly nodular hepatic contour (series 3/image 21), raising the possibility of cirrhosis.  No focal hepatic lesion.  Spleen, pancreas, and adrenal glands are within normal limits.  Gallbladder is underdistended.  No intrahepatic or extrahepatic ductal dilatation.  Kidneys are within normal limits.  No hydronephrosis.  No evidence of bowel obstruction.  Normal appendix.  Sigmoid diverticulitis with associated 3.9 x 4.6 x 4.7 cm diverticular abscess (series 3/image 57).  Extensive atherosclerotic calcifications of the abdominal aorta and branch vessels.  No abdominopelvic  ascites.  No suspicious abdominopelvic lymphadenopathy.  Status post hysterectomy.  No adnexal masses.  Bladder is within normal limits.  Degenerative changes of the visualized thoracolumbar spine.  IMPRESSION: Sigmoid diverticulitis with associated 4.7 cm diverticular abscess.  Additional stable ancillary findings as above. Original Report Authenticated By: Charline Bills, M.D.    Scheduled Meds:   . antiseptic oral rinse  15 mL Mouth Rinse BID  . atorvastatin  10 mg Oral q1800  . ertapenem  1 g Intravenous Q24H  . famotidine  20 mg Oral QHS  . ferrous sulfate  325 mg Oral BID WC  . levothyroxine  88 mcg Oral QAC breakfast  . mupirocin ointment   Nasal BID  . olopatadine  1 drop Both Eyes BID  . saccharomyces boulardii  250 mg Oral BID  . tiotropium  18 mcg Inhalation Daily  . vancomycin  125 mg Oral QID  . vitamin C  250 mg Oral BID  . warfarin  5 mg Oral ONCE-1800  . Warfarin - Pharmacist Dosing Inpatient   Does not apply q1800   Continuous Infusions:   Lambert Keto, MD  [facility] Pager [phone]  If 7PM-7AM, please contact night-coverage [email or web address] Password [facility] 10/23/2011, 9:53 AM

## 2011-10-23 NOTE — Progress Notes (Signed)
Clinical Social Worker continuing to follow. Received notification from MD that drain was removed today and will monitor pt today and anticipate discharge tomorrow. Plan for discharge is rehab at [facility] before pt returns to [facility]. Clinical Social Worker contacted [facility] admission coordinator and left message to notify. Clinical Social Worker to continue to follow and facilitate pt discharge needs when pt medically ready for discharge.  Jacklynn Lewis, MSW, LCSWA  Clinical Social Work [phone]

## 2011-10-24 MED ORDER — HYDROCODONE-ACETAMINOPHEN 5-325 MG PO TABS: 1.0000 | ORAL_TABLET | Freq: Four times a day (QID) | ORAL | Status: DC | PRN

## 2011-10-24 MED ORDER — VANCOMYCIN 50 MG/ML ORAL SOLUTION: 125.0000 mg | Freq: Four times a day (QID) | ORAL | Status: DC

## 2011-10-24 MED ORDER — WARFARIN SODIUM 5 MG PO TABS
5.0000 mg | ORAL_TABLET | Freq: Every day | ORAL | Status: DC
  Filled 2011-10-24: qty 1

## 2011-10-24 NOTE — Progress Notes (Signed)
Patient [name], 43 year white female resident of [facility] feels annoyed that hospital has not yet discharged her.  She is happy with the nurses, doctors, and social workers; but frustrated that "the system won't let her go home."  Patient enjoys the emotional support of her children, grandchildren and great-grandchildren.  She speaks highly of her deceased husband [name].  Patient is also animated when recalling her years in [place] and [place] before moving to [place].  Patient thanked Orthoptist for providing pastoral presence and conversation.  I will follow-up as needed.

## 2011-10-24 NOTE — Progress Notes (Signed)
Report Called to [employer] RN received the report.

## 2011-10-24 NOTE — Progress Notes (Signed)
Patient ID: [name], female   DOB: March 13, 1920, 78 y.o.   MRN: [id number] Patient ID: [name], female   DOB: 14-Oct-1919, 78 y.o.   MRN: [id number] Patient ID: [name], female   DOB: 11-04-1919, 78 y.o.   MRN: [id number]    Subjective: Nocomplaints offered by patient this morning. Appears comfortable. Objective: Vital signs in last 24 hours: Temp:  [98.1 F (36.7 C)-98.5 F (36.9 C)] 98.1 F (36.7 C) (07/31 0621) Pulse Rate:  [65-83] 70  (07/31 0621) Resp:  [16-20] 16  (07/31 0621) BP: (138-149)/(64-73) 138/66 mmHg (07/31 0621) SpO2:  [91 %-98 %] 95 % (07/31 0621) Last BM Date: 10/23/11  Intake/Output from previous day: 07/30 0701 - 07/31 0700 In: 350 [P.O.:350] Out: -  Intake/Output this shift: Total I/O In: 240 [P.O.:240] Out: -  General appearance: A/A/O Comfortable in appearance Abdomen soft, with no tenderness currently VSS, afebrile.  Lab Results:   [facility] 10/22/11 0515  WBC 10.5  HGB 9.2*  HCT 30.7*  PLT 437*   BMET No results found for this basename: NA:2,K:2,CL:2,CO2:2,GLUCOSE:2,BUN:2,CREATININE:2,CALCIUM:2 in the last 72 hours PT/INR  Basename 10/24/11 0500 10/23/11 0545  LABPROT 30.6* 31.1*  INR 2.88* 2.94*   ABG No results found for this basename: PHART:2,PCO2:2,PO2:2,HCO3:2 in the last 72 hours  Studies/Results: Ct Abdomen Pelvis W Contrast  10/22/2011  *RADIOLOGY REPORT*  Clinical Data: History of diverticular abscess with drain in place.  CT ABDOMEN AND PELVIS WITH CONTRAST  Technique:  Multidetector CT imaging of the abdomen and pelvis was performed following the standard protocol during bolus administration of intravenous contrast.  Contrast: 80mL OMNIPAQUE IOHEXOL 300 MG/ML  SOLN  Comparison: 10/15/2011.  Findings: Lung bases show emphysema with dependent atelectasis and/or scarring, left greater than right.  Moderate hiatal hernia. Heart size normal.  No pericardial or pleural effusion.  Liver measures 20 cm, its margin  is irregular and there is hypertrophy of the left hepatic lobe.   Gallbladder and adrenal glands unremarkable.  Probable small stone in the lower pole right kidney.  Sub centimeter low attenuation lesions in the kidneys are too small to characterize.  Spleen, pancreas, stomach and small bowel are unremarkable.  There is presacral edema, increased from 10/15/2011.  A percutaneous drain is seen from a posterior approach, terminating in the right anatomic pelvis, adjacent to the sigmoid colon.  Mild residual inflammatory changes around the sigmoid colon with marked improvement from 10/15/2011.  No residual organized fluid collection.  Colon is otherwise unremarkable.  No free fluid.  No pathologically enlarged lymph nodes.  Extensive atherosclerotic calcification of the arterial vasculature without abdominal aortic aneurysm.  No worrisome lytic or sclerotic lesions.  IMPRESSION:  1.  Resolving inflammatory changes of sigmoid diverticulitis. Specifically, an abscess is no longer identified.  Percutaneous drain is in place. 2.  Interval increase in presacral edema, possibly reactive to indwelling drain. 3.  Enlarged cirrhotic liver. 4.  Probable right renal stone.  Original Report Authenticated By: Reyes Ivan, M.D.    Anti-infectives: Anti-infectives     Start     Dose/Rate Route Frequency Ordered Stop   10/23/11 1100   ciprofloxacin (CIPRO) tablet 500 mg        500 mg Oral 2 times daily 10/23/11 0956     10/18/11 1800   vancomycin (VANCOCIN) 50 mg/mL oral solution 125 mg        125 mg Oral 4 times daily 10/18/11 1732 11/01/11 1759   10/18/11 0800   ertapenem (INVANZ) 1 g in sodium  chloride 0.9 % 50 mL IVPB  Status:  Discontinued        1 g 100 mL/hr over 30 Minutes Intravenous Every 24 hours 10/18/11 0753 10/23/11 0956   10/17/11 1400   amoxicillin-clavulanate (AUGMENTIN) 875-125 MG per tablet 1 tablet  Status:  Discontinued        1 tablet Oral Every 12 hours 10/17/11 1255 10/18/11 0842    10/15/11 1800   ciprofloxacin (CIPRO) IVPB 400 mg  Status:  Discontinued        400 mg 200 mL/hr over 60 Minutes Intravenous Every 12 hours 10/15/11 1639 10/17/11 1254   10/15/11 1800   metroNIDAZOLE (FLAGYL) IVPB 500 mg  Status:  Discontinued        500 mg 100 mL/hr over 60 Minutes Intravenous Every 8 hours 10/15/11 1639 10/17/11 1254          Assessment/Plan: s/p * No surgery found *  Diverticulitis with abscess C.diff colitis Patient to be discharged today Follow up with her PCP as needed.    LOS: 9 days    Megann Easterwood 10/24/2011

## 2011-10-24 NOTE — Progress Notes (Signed)
Clinical Social Worker facilitated pt discharge needs to [facility] including contact facility, faxing pt discharge paperwork, discussing with pt at bedside who reports pt friends will provide transportation to [facility] and pt has portable oxygen, providing contact number to RN to call report to [facility], and providing discharge packet to pt and pt friends to provide to facility when pt arrives. No further social work needs identified at this time. Clinical Social Worker signing off.   Jacklynn Lewis, MSW, LCSWA  Clinical Social Work [phone]

## 2011-10-24 NOTE — Progress Notes (Signed)
Clinical Social Work Department CLINICAL SOCIAL WORK PLACEMENT NOTE 10/24/2011  Patient:  NYAH, SHEPHERD  Account Number:  [id number] Admit date:  10/15/2011  Clinical Social Worker:  Jacelyn Grip  Date/time:  10/17/2011 01:15 PM  Clinical Social Work is seeking post-discharge placement for this patient at the following level of care:   SKILLED NURSING   (*CSW will update this form in Epic as items are completed)   10/17/2011  Patient/family provided with [facility] Department of Clinical Social Work's list of facilities offering this level of care within the geographic area requested by the patient (or if unable, by the patient's family).  10/17/2011  Patient/family informed of their freedom to choose among providers that offer the needed level of care, that participate in Medicare, Medicaid or managed care program needed by the patient, have an available bed and are willing to accept the patient.  10/17/2011  Patient/family informed of [facility]' ownership interest in [facility], as well as of the fact that they are under no obligation to receive care at this facility.  PASARR submitted to EDS on 10/17/2011 PASARR number received from EDS on 10/17/2011  FL2 transmitted to all facilities in geographic area requested by pt/family on  10/17/2011 FL2 transmitted to all facilities within larger geographic area on   Patient informed that his/her managed care company has contracts with or will negotiate with  certain facilities, including the following:     Patient/family informed of bed offers received:  10/17/2011 Patient chooses bed at [facility] Physician recommends and patient chooses bed at    Patient to be transferred to [facility] on  10/24/2011 Patient to be transferred to facility by pt friend by car  The following physician request were entered in Epic:   Additional Comments: Pt from [facility] and discharged to [facility].   Jacklynn Lewis, MSW, LCSWA  Clinical Social Work [phone]

## 2011-10-24 NOTE — Progress Notes (Signed)
Agree with NP note  Mariella Saa MD, FACS  10/24/2011, 12:43 PM

## 2011-10-24 NOTE — Progress Notes (Signed)
ANTICOAGULATION CONSULT NOTE - Follow Up Consult  Pharmacy Consult for warfarin Indication: atrial fibrillation  Allergies  Allergen Reactions  . Cortisone Other (See Comments)    Unknown   . Minocin (Minocycline Hcl) Other (See Comments)    Unknown  . Minocycline Hcl   . Neomycin-Bacitracin Zn-Polymyx   . Neosporin (Neomycin-Bacitracin Zn-Polymyx) Other (See Comments)    Unknown  . Other Other (See Comments)    Steroidal Neuromuscular Blockers  . Tetanus Toxoids Other (See Comments)    Unknown    Patient Measurements: Height: 5\' 4"  (162.6 cm) Weight: 160 lb 11.2 oz (72.893 kg) IBW/kg (Calculated) : 54.7   Vital Signs: Temp: 98.1 F (36.7 C) (07/31 0621) Temp src: Oral (07/31 0621) BP: 138/66 mmHg (07/31 0621) Pulse Rate: 70  (07/31 0621)  Labs:  Basename 10/24/11 0500 10/23/11 0545 10/22/11 0800 10/22/11 0515  HGB -- -- -- 9.2*  HCT -- -- -- 30.7*  PLT -- -- -- 437*  APTT -- -- -- --  LABPROT 30.6* 31.1* 28.1* --  INR 2.88* 2.94* 2.58* --  HEPARINUNFRC -- -- -- --  CREATININE -- -- -- --  CKTOTAL -- -- -- --  CKMB -- -- -- --  TROPONINI -- -- -- --    Estimated Creatinine Clearance: 45.7 ml/min (by C-G formula based on Cr of 0.76).   Medications:  Scheduled:    . antiseptic oral rinse  15 mL Mouth Rinse BID  . atorvastatin  10 mg Oral q1800  . ciprofloxacin  500 mg Oral BID  . famotidine  20 mg Oral QHS  . ferrous sulfate  325 mg Oral BID WC  . levothyroxine  88 mcg Oral QAC breakfast  . mupirocin ointment   Nasal BID  . olopatadine  1 drop Both Eyes BID  . saccharomyces boulardii  250 mg Oral BID  . tiotropium  18 mcg Inhalation Daily  . vancomycin  125 mg Oral QID  . vitamin C  250 mg Oral BID  . warfarin  5 mg Oral ONCE-1800  . Warfarin - Pharmacist Dosing Inpatient   Does not apply q1800    Assessment: 89 YOF on warfarin for history of Atrial Fibrillation. INR therapeutic at 2.88. She has been therapeutic over the last 4 days. No signs of  bleeding. H&H stable, but low with elevated platelets. Is likely being discharged to SNF today.  Goal of Therapy:  INR 2-3 Monitor platelets by anticoagulation protocol: Yes   Plan:  1. Restart home warfarin dose of 5mg  PO QDay 2. Daily PT/INR if still in hospital tomorrow  Aubra Pappalardo D. Keon Benscoter, PharmD Clinical Pharmacist Pager: [phone] Phone: [phone] 10/24/2011 10:16 AM

## 2011-10-24 NOTE — Discharge Summary (Signed)
Physician Discharge Summary  [name] NWG:[id number] DOB: [birth year] DOA: 10/15/2011  PCP: Karen Relic, MD  Admit date: 10/15/2011 Discharge date: 10/24/2011  Recommendations for Outpatient Follow-up:  1. Assure resolution of diarrhea - and then resume laxatives   Discharge Diagnoses:  Diverticular abscess -s/p drainage - drain pulled  COPD  Chronic respiratory failure on home oxygen   Anemia of chronic ds  C. difficile colitis   Discharge Condition: good, able to tolerate regular diet   Diet recommendation: regular  Wt Readings from Last 3 Encounters:  10/15/11 72.893 kg (160 lb 11.2 oz)  10/15/11 71.487 kg (157 lb 9.6 oz)  09/21/11 75.2 kg (165 lb 12.6 oz)    History of present illness:  [name] is a 78 y.o. female hospitalized from 09/15/11 to 09/21/11 with diverticulitis. She had persistent abdominal pain and constipation. CT abd/pelvis 10/15/11 showed diverticular abscess.  Ms. [name] is followed in the pulmonary office for COPD, chronic hypoxic respiratory failure, and lung nodule. She was schedule for follow up on 07/22 in pulmonary office for review of CT chest. During this evaluation she complained of constipation and persistent lower abdominal pain. She has also notice low grade intermittent temperatures. She denies nausea or vomiting.  She had outpatient CT abd/pelvis which showed 4.7 cm diverticular abscess. As a result she was advised she would need hospital admission.   Hospital Course:  1. Diverticular abscess -  The patient ha CT scan of abdomen and pelvis on the day of admission and a diverticular abscess was found. Patient was seen in consultation by general surgery and interventional radiology and on July 23 she had a percutaneous drain placed . The patient was started on empiric ciprofloxacin and Flagyl intravenously . The culture from the drain grew Klebsiella pneumonia and microaerophilic streptococci.  On July 29 the patient had a  repeat CT scan of abdomen and pelvis and no further abscess was identified . The drain was pulled on October 23, 2011 and the patient was observed overnight without any fever being recorded .  2. C. difficile colitis - on July 25 he was noted that the patient's fever and white count were going up . She was also having increased diarrhea . C. difficile toxin by PCR confirmed presence of Clostridium difficile colitis , and the patient was started on oral vancomycin . The patient is to have 7 more days of oral vancomycin at the time of discharge . 3. COPD with chronic respiratory failure-patient will continue her inhalers and home oxygen without changes   Procedures:  Interventional radiology placement of a drainage into the diverticular abscess   Consultations:  [facility]   Discharge Exam: Filed Vitals:   10/24/11 0621  BP: 138/66  Pulse: 70  Temp: 98.1 F (36.7 C)  Resp: 16   Filed Vitals:   10/23/11 0501 10/23/11 1406 10/23/11 2155 10/24/11 0621  BP: 178/82 149/73 146/64 138/66  Pulse: 76 83 65 70  Temp: 98.7 F (37.1 C) 98.5 F (36.9 C) 98.3 F (36.8 C) 98.1 F (36.7 C)  TempSrc: Oral Oral Oral Oral  Resp: 18 18 20 16   Height:      Weight:      SpO2: 96% 91% 98% 95%    General: Alert oriented x3  Cardiovascular: Regular rate and rhythm  Respiratory: Clear bilaterally   Discharge Instructions  Discharge Orders    Future Orders Please Complete By Expires   Diet general      Increase activity slowly  Medication List  As of 10/24/2011 11:33 AM   STOP taking these medications         omeprazole 40 MG capsule      polyethylene glycol packet      senna 8.6 MG Tabs         TAKE these medications         acetaminophen 325 MG tablet   Commonly known as: TYLENOL   Take 650 mg by mouth every 6 (six) hours as needed. For pain      acyclovir ointment 5 %   Commonly known as: ZOVIRAX   Apply 1 application topically every 3 (three) hours as  needed. For cold sores      amoxicillin 500 MG capsule   Commonly known as: AMOXIL   Take 2,000 mg by mouth once. Prior to dental procedure      atorvastatin 10 MG tablet   Commonly known as: LIPITOR   Take 10 mg by mouth daily.      donepezil 10 MG tablet   Commonly known as: ARICEPT   Take 10 mg by mouth daily.      escitalopram 20 MG tablet   Commonly known as: LEXAPRO   Take 20 mg by mouth daily.      estrogens (conjugated) 0.3 MG tablet   Commonly known as: PREMARIN   Take 0.3 mg by mouth daily. Take daily for 21 days then do not take for 7 days.      fluticasone 50 MCG/ACT nasal spray   Commonly known as: FLONASE   Place 2 sprays into the nose daily as needed. For dry nasal passages      folic acid 1 MG tablet   Commonly known as: FOLVITE   Take 1 mg by mouth daily.      HYDROcodone-acetaminophen 5-325 MG per tablet   Commonly known as: NORCO/VICODIN   Take 1 tablet by mouth every 6 (six) hours as needed for pain. For pain      hydrocortisone-pramoxine 2.5-1 % rectal cream   Commonly known as: ANALPRAM-HC   Place 1 application rectally 2 (two) times daily as needed. For hemorrhoids      ipratropium-albuterol 0.5-2.5 (3) MG/3ML Soln   Commonly known as: DUONEB   Take 3 mLs by nebulization every 6 (six) hours as needed. For wheezing and shortness of breath      meloxicam 7.5 MG tablet   Commonly known as: MOBIC   Take 7.5 mg by mouth daily.      olopatadine 0.1 % ophthalmic solution   Commonly known as: PATANOL   Place 1 drop into both eyes 2 (two) times daily as needed. For dry eyes      SYSTANE 0.4-0.3 % Soln   Generic drug: Polyethyl Glycol-Propyl Glycol   Place 1 drop into both eyes daily as needed. For dry eyes      tiotropium 18 MCG inhalation capsule   Commonly known as: SPIRIVA   Place 18 mcg into inhaler and inhale daily.      vancomycin 50 mg/mL oral solution   Commonly known as: VANCOCIN   Take 2.5 mLs (125 mg total) by mouth 4 (four) times  daily. 1 week supply      Vitamin D 2000 UNITS Caps   Take 1 capsule by mouth daily.      warfarin 5 MG tablet   Commonly known as: COUMADIN   Take 5 mg by mouth daily.  Follow-up Information    Schedule an appointment as soon as possible for a visit with GREEN, Lenon Curt, MD.   Contact information:   [address] [place] [address] [phone]           The results of significant diagnostics from this hospitalization (including imaging, microbiology, ancillary and laboratory) are listed below for reference.    Significant Diagnostic Studies: Ct Chest Wo Contrast  10/08/2011  *RADIOLOGY REPORT*  Clinical Data: Follow-up evaluation of pulmonary nodules.  CT CHEST WITHOUT CONTRAST  Technique:  Multidetector CT imaging of the chest was performed following the standard protocol without IV contrast.  Comparison: Chest CT 06/22/2011.  Findings:  Mediastinum: Heart size is mildly enlarged. There is no significant pericardial fluid, thickening or pericardial calcification. There is atherosclerosis of the thoracic aorta, the great vessels of the mediastinum and the coronary arteries, including calcified atherosclerotic plaque in the left main, left anterior descending, left circumflex and right coronary arteries. Extensive calcifications of the aortic valve and mitral annulus. No pathologically enlarged mediastinal or hilar lymph nodes. Please note that accurate exclusion of hilar adenopathy is limited on noncontrast CT scans. There is a moderate - large hiatal hernia.  Lungs/Pleura: 1 cm ground-glass attenuation nodule in the lateral segment of the right middle lobe (image 39 of series 3) is similar to the prior examination (previously reported as 5 mm, but differences are likely attributable to slice selection and patient position).  Previously noted 1 cm right lower lobe nodule is no longer as nodular in appearance, and is adjacent to an area of architectural  distortion and bullous change, favored to represent an evolving area of scarring (best demonstrated on images 33 - 36 of series 3).  No other definite suspicious-appearing pulmonary nodules or masses are identified on today's examination.  There is a background of moderate centrilobular and paraseptal emphysema with areas of architectural distortion throughout the periphery of the upper lobes of the lungs bilaterally (which is likely to represent areas of post infectious inflammatory scarring).  No acute consolidative airspace disease.  Small right-sided pleural effusion is noted layering dependently.  Upper Abdomen: Extensive atherosclerosis.  Musculoskeletal: There are no aggressive appearing lytic or blastic lesions noted in the visualized portions of the skeleton.  IMPRESSION: 1.  Previously noted 1 cm pulmonary nodule in the right lower lobe is no longer as clearly defined, and is favored to represent an area of evolving post infectious/post inflammatory scarring. 2.  There is, however, a 1 cm subsolid nodule in the lateral segment of the right middle lobe which is unchanged in retrospect compared to the prior examination.  An indolent neoplasm such as a slow-growing adenocarcinoma cannot be excluded.  Followup by CT is recommended in 12 months, with continued annual surveillance for a minimum of 3 years.  These recommendations are taken from "Recommendations for the Management of Subsolid Pulmonary Nodules Detected at CT:  A Statement from the Fleischner Society" Radiology 2013; 266:1, 304- 317.  3.  Centrilobular and paraseptal emphysema, as above. 4. Atherosclerosis, including left main and three-vessel coronary artery disease. 5. There are calcifications of the aortic valve and mitral annulus. Echocardiographic correlation for evaluation of potential valvular dysfunction may be warranted if clinically indicated. 6.  Large hiatal hernia.  Original Report Authenticated By: Florencia Reasons, M.D.   Ct  Guided Abscess Drain  10/16/2011  *RADIOLOGY REPORT*  CT GUIDED ABSCESS DRAIN  Date: 10/16/2011  Clinical History: 78 year old female with perforated diverticulitis and now diverticular  abscess.  She presents for percutaneous drain placement.  Procedures Performed: 1. CT guided placement of a 12-French drain  Interventional Radiologist:  Sterling Big, MD  Sedation: Moderate (conscious) sedation was used.  1.5 mg Versed, 50 mcg Fentanyl were administered intravenously.  The patient's vital signs were monitored continuously by radiology nursing throughout the procedure.  Sedation Time: 30 minutes  Fluoroscopy time:  8 seconds  PROCEDURE/FINDINGS:   Informed consent was obtained from the patient following explanation of the procedure, risks, benefits and alternatives. The patient understands, agrees and consents for the procedure. All questions were addressed. A time out was performed.  Maximal barrier sterile technique utilized including caps, mask, sterile gowns, sterile gloves, large sterile drape, hand hygiene, and betadine skin prep.  A planning axial CT scan was performed.  The right lower quadrant diverticular abscess was identified.  A suitable skin entry site was selected and marked.  Local anesthesia of the skin underlying soft tissues was obtained by infiltration of 1% lidocaine.  Under direct CT guidance, an 18 gauge trocar needle was advanced to the skin, soft tissues, gluteal muscle and peritoneal lining into the diverticular abscess.  A small amount of frankly purulent material was aspirated.  A short stiff Amplatz wire was then advanced through the needle and coiled within the abscess cavity.  Serial dilatation of the tract was then performed and ultimately a 12 [place] multipurpose drainage catheter was advanced over the wire and coiled within the abscess cavity.  Approximately 20 ml of foul-smelling material was then aspirated.  A final axial CT image was performed confirming successful  placement of the pigtail catheter within the previously identified abscess collection. The drainage catheter was then connected to bulb suction and secured to the skin with Prolene suture and an Accustick adhesive device.  Aspirated material was sent for culture.  The patient tolerated the procedure well, there is no immediate complication.  The patient was returned to the floor in stable condition.  IMPRESSION:  Successful CT guided placement of a 12-French drain into the pelvic diverticular abscess.  Aspirated material sent for Gram stain and culture.  Signed,  Sterling Big, MD Vascular & Interventional Radiologist [facility] Radiology  Original Report Authenticated By: Threasa Beards Abdomen Pelvis W Contrast  10/22/2011  *RADIOLOGY REPORT*  Clinical Data: History of diverticular abscess with drain in place.  CT ABDOMEN AND PELVIS WITH CONTRAST  Technique:  Multidetector CT imaging of the abdomen and pelvis was performed following the standard protocol during bolus administration of intravenous contrast.  Contrast: 80mL OMNIPAQUE IOHEXOL 300 MG/ML  SOLN  Comparison: 10/15/2011.  Findings: Lung bases show emphysema with dependent atelectasis and/or scarring, left greater than right.  Moderate hiatal hernia. Heart size normal.  No pericardial or pleural effusion.  Liver measures 20 cm, its margin is irregular and there is hypertrophy of the left hepatic lobe.   Gallbladder and adrenal glands unremarkable.  Probable small stone in the lower pole right kidney.  Sub centimeter low attenuation lesions in the kidneys are too small to characterize.  Spleen, pancreas, stomach and small bowel are unremarkable.  There is presacral edema, increased from 10/15/2011.  A percutaneous drain is seen from a posterior approach, terminating in the right anatomic pelvis, adjacent to the sigmoid colon.  Mild residual inflammatory changes around the sigmoid colon with marked improvement from 10/15/2011.  No residual organized  fluid collection.  Colon is otherwise unremarkable.  No free fluid.  No pathologically enlarged lymph nodes.  Extensive atherosclerotic calcification of the arterial vasculature without abdominal aortic aneurysm.  No worrisome lytic or sclerotic lesions.  IMPRESSION:  1.  Resolving inflammatory changes of sigmoid diverticulitis. Specifically, an abscess is no longer identified.  Percutaneous drain is in place. 2.  Interval increase in presacral edema, possibly reactive to indwelling drain. 3.  Enlarged cirrhotic liver. 4.  Probable right renal stone.  Original Report Authenticated By: Reyes Ivan, M.D.   Ct Abdomen Pelvis W Contrast  10/15/2011  **ADDENDUM** CREATED: 10/15/2011 15:27:49  These results were called by telephone on 10/15/2011 at 1528 hours to Dr. Murray Hodgkins, who verbally acknowledged these results.  **END ADDENDUM** SIGNED BY: Charline Bills, M.D.   10/15/2011  *RADIOLOGY REPORT*  Clinical Data: Follow-up diverticulitis with small contained perforation, lower abdominal cramping x3 weeks  CT ABDOMEN AND PELVIS WITH CONTRAST  Technique:  Multidetector CT imaging of the abdomen and pelvis was performed following the standard protocol during bolus administration of intravenous contrast.  Contrast: 100 ml Omnipaque-300 IV  Comparison: 09/16/2010  Findings: Paraseptal emphysematous changes in the right lower lobe.  Moderate hiatal hernia.  Mildly nodular hepatic contour (series 3/image 21), raising the possibility of cirrhosis.  No focal hepatic lesion.  Spleen, pancreas, and adrenal glands are within normal limits.  Gallbladder is underdistended.  No intrahepatic or extrahepatic ductal dilatation.  Kidneys are within normal limits.  No hydronephrosis.  No evidence of bowel obstruction.  Normal appendix.  Sigmoid diverticulitis with associated 3.9 x 4.6 x 4.7 cm diverticular abscess (series 3/image 57).  Extensive atherosclerotic calcifications of the abdominal aorta and branch vessels.  No  abdominopelvic ascites.  No suspicious abdominopelvic lymphadenopathy.  Status post hysterectomy.  No adnexal masses.  Bladder is within normal limits.  Degenerative changes of the visualized thoracolumbar spine.  IMPRESSION: Sigmoid diverticulitis with associated 4.7 cm diverticular abscess.  Additional stable ancillary findings as above. Original Report Authenticated By: Charline Bills, M.D.    Microbiology: Recent Results (from the past 240 hour(s))  ANAEROBIC CULTURE     Status: Normal   Collection Time   10/16/11  3:28 PM      Component Value Range Status Comment   Specimen Description ABSCESS PERITONEAL   Final    Special Requests NONE   Final    Gram Stain     Final    Value: ABUNDANT WBC PRESENT,BOTH PMN AND MONONUCLEAR     NO SQUAMOUS EPITHELIAL CELLS SEEN     ABUNDANT GRAM POSITIVE COCCI IN PAIRS AND CHAINS   Culture NO ANAEROBES ISOLATED   Final    Report Status 10/21/2011 FINAL   Final   CULTURE, ROUTINE-ABSCESS     Status: Normal   Collection Time   10/16/11  3:28 PM      Component Value Range Status Comment   Specimen Description ABSCESS PERITONEAL   Final    Special Requests NONE   Final    Gram Stain     Final    Value: ABUNDANT WBC PRESENT,BOTH PMN AND MONONUCLEAR     NO SQUAMOUS EPITHELIAL CELLS SEEN     ABUNDANT GRAM POSITIVE COCCI IN PAIRS AND CHAINS   Culture     Final    Value: FEW KLEBSIELLA PNEUMONIAE     MODERATE MICROAEROPHILIC STREPTOCOCCI     Note: Standardized susceptibility testing for this organism is not available.   Report Status 10/19/2011 FINAL   Final    Organism ID, Bacteria KLEBSIELLA PNEUMONIAE   Final   CLOSTRIDIUM DIFFICILE BY PCR  Status: Abnormal   Collection Time   10/18/11  3:24 PM      Component Value Range Status Comment   C difficile by pcr POSITIVE (*) NEGATIVE Final      Labs: Basic Metabolic Panel:  Lab 10/20/11 6295 10/18/11 0514  NA 141 138  K 3.6 3.8  CL 102 99  CO2 30 27  GLUCOSE 99 110*  BUN 10 8  CREATININE  0.76 0.97  CALCIUM 8.8 8.8  MG -- --  PHOS -- --   Liver Function Tests: No results found for this basename: AST:5,ALT:5,ALKPHOS:5,BILITOT:5,PROT:5,ALBUMIN:5 in the last 168 hours No results found for this basename: LIPASE:5,AMYLASE:5 in the last 168 hours No results found for this basename: AMMONIA:5 in the last 168 hours CBC:  Lab 10/22/11 0515 10/20/11 0530 10/19/11 0520 10/18/11 0514  WBC 10.5 10.2 14.1* 20.3*  NEUTROABS -- -- -- --  HGB 9.2* 9.1* 8.5* 9.1*  HCT 30.7* 30.3* 28.4* 30.0*  MCV 78.5 79.3 79.1 78.9  PLT 437* 378 340 345   Cardiac Enzymes: No results found for this basename: CKTOTAL:5,CKMB:5,CKMBINDEX:5,TROPONINI:5 in the last 168 hours BNP: BNP (last 3 results) No results found for this basename: PROBNP:3 in the last 8760 hours CBG: No results found for this basename: GLUCAP:5 in the last 168 hours  Time coordinating discharge: 45  minutes  Signed:  Arijana Narayan  [facility] 10/24/2011, 11:33 AM

## 2011-10-25 NOTE — Progress Notes (Signed)
Clinical Social Work Department CLINICAL SOCIAL WORK PLACEMENT NOTE 10/25/2011  Patient:  [name], [name]  Account Number:  [id number] Admit date:  10/15/2011  Clinical Social Worker:  Jacelyn Grip  Date/time:  10/17/2011 01:15 PM  Clinical Social Work is seeking post-discharge placement for this patient at the following level of care:   SKILLED NURSING   (*CSW will update this form in Epic as items are completed)   10/17/2011  Patient/family provided with [facility] Department of Clinical Social Work's list of facilities offering this level of care within the geographic area requested by the patient (or if unable, by the patient's family).  10/17/2011  Patient/family informed of their freedom to choose among providers that offer the needed level of care, that participate in Medicare, Medicaid or managed care program needed by the patient, have an available bed and are willing to accept the patient.  10/17/2011  Patient/family informed of [facility]' ownership interest in [facility], as well as of the fact that they are under no obligation to receive care at this facility.  PASARR submitted to EDS on 10/17/2011 PASARR number received from EDS on 10/17/2011  FL2 transmitted to all facilities in geographic area requested by pt/family on  10/17/2011 FL2 transmitted to all facilities within larger geographic area on   Patient informed that his/her managed care company has contracts with or will negotiate with  certain facilities, including the following:     Patient/family informed of bed offers received:  10/17/2011 Patient chooses bed at [facility] Physician recommends and patient chooses bed at    Patient to be transferred to [facility] on  10/24/2011 Patient to be transferred to facility by pt friend by car  The following physician request were entered in Epic:   Additional Comments: Pt from [facility] and discharged to [facility].

## 2012-03-28 ENCOUNTER — Other Ambulatory Visit: Payer: Self-pay | Admitting: Dermatology

## 2012-05-01 ENCOUNTER — Other Ambulatory Visit (HOSPITAL_COMMUNITY): Payer: Self-pay | Admitting: Internal Medicine

## 2012-05-01 DIAGNOSIS — I6529 Occlusion and stenosis of unspecified carotid artery: Secondary | ICD-10-CM

## 2012-05-06 ENCOUNTER — Ambulatory Visit (HOSPITAL_COMMUNITY)
Admission: RE | Admit: 2012-05-06 | Discharge: 2012-05-06 | Disposition: A | Discharge status: Home / Self Care | Payer: Medicare Other | Source: Ambulatory Visit | Attending: Cardiovascular Disease | Admitting: Cardiovascular Disease

## 2012-05-06 DIAGNOSIS — I6529 Occlusion and stenosis of unspecified carotid artery: Secondary | ICD-10-CM

## 2012-05-06 NOTE — Progress Notes (Signed)
Carotid artery duplex completed.  05/06/2012 3:55 PM Gertie Fey, RDMS, RDCS

## 2012-06-13 ENCOUNTER — Encounter: Payer: Self-pay | Admitting: Internal Medicine

## 2012-06-13 DIAGNOSIS — Z299 Encounter for prophylactic measures, unspecified: Secondary | ICD-10-CM

## 2012-06-25 ENCOUNTER — Non-Acute Institutional Stay: Payer: Medicare Other | Admitting: Geriatric Medicine

## 2012-06-25 ENCOUNTER — Encounter: Payer: Self-pay | Admitting: Geriatric Medicine

## 2012-06-25 VITALS — BP 120/74 | HR 72 | Wt 171.0 lb

## 2012-06-25 DIAGNOSIS — I6529 Occlusion and stenosis of unspecified carotid artery: Secondary | ICD-10-CM | POA: Insufficient documentation

## 2012-06-25 DIAGNOSIS — I6522 Occlusion and stenosis of left carotid artery: Secondary | ICD-10-CM

## 2012-06-25 DIAGNOSIS — Z7901 Long term (current) use of anticoagulants: Secondary | ICD-10-CM

## 2012-06-25 NOTE — Progress Notes (Signed)
Patient ID: [name], female   DOB: [birth year], 79 y.o.   MRN: [id number]    Chief Complaint  Patient presents with  . Medical Managment of Chronic Issues    coag check     HPI  This 79 year old female resident of [name], [place] section,  returns to clinic today for coagulation management. Patient reports no acute medical changes though she does say she is quite worn out helping her daughter move to her new store in [place]. Her breathing has been worse when she's active. Patient reports her hearing is much worse, she has scheduled an evaluation at the cardiologist.   Allergies Reviewed   Medications Reviewed   Data Reviewed    Radiology:   Lab [facility], external):  05/01/12 WBC 8.1, Hgb 10.7, Hct 34.6, Plt 300   Sed rate 36   CRP <0.5  05/29/2012: Glucose 98, BUN 21, creatinine 1.17, sodium 142, potassium 4.7. Protein/LFTs WNL.   Cholesterol 277, triglycerides 273, HDL 38, LDL 184   TSH 2.06   POCT  04/30/2012: INR 2.1  05/28/12: INR 2.3  06/25/12 INR 2.1   Other:    REVIEW OF SYSTEMS  DATA OBTAINED: from patient,  GENERAL: Feels tired out. No fevers, change in activity staus, appetite or weight EARS: Decreased hearing RESPIRATORY: No cough, wheezing. DOE present CARDIAC: No chest pain, palpitations. No edema. GI: No abdominal pain. No N/V/D or constipation. No heartburn or reflux.  MUSCULOSKELETAL: No joint pain, swelling or stiffness. No back pain. No muscle ache, pain, weakness. Gait is steady. No recent falls.  NEUROLOGIC: No dizziness, fainting, headache, imbalance, numbness. No change in mental status.  PSYCHIATRIC: No anxiety, depression, behavior issue  PHYSICAL EXAM  GENERAL APPEARANCE: No acute distress, appropriately groomed, normal body habitus. Alert, pleasant, conversant. HEAD: Normocephalic, atraumatic EYES: Conjunctiva/lids clear.  RESPIRATORY: Breathing is even, unlabored. Lung sounds are clear and full.  CARDIOVASCULAR: Heart RRR.  Grade 3/4 holosystolic murmur (not new)    EDEMA: No peripheral or periorbital edema.   MUSCULOSKELETAL.  Gait is steady with walker PSYCHIATRIC: Mood and affect appropriate to situation.   ASSESSMENT/PLAN  Long term (current) use of anticoagulants INR remains in therapeutic range, continue current Coumadin dose. Next INR 4 weeks.  Carotid artery occlusion Carotid duplex exam 05/06/2012 revealed no change since previous; left occluded carotid artery, right with 0-49% stenosis.  Headache has resolved.   Follow up: 4 weeks   Nashali Ditmer T.Marlyn Rabine, NP-C 06/25/2012

## 2012-06-25 NOTE — Assessment & Plan Note (Signed)
INR remains in therapeutic range, continue current Coumadin dose. Next INR 4 weeks.

## 2012-06-25 NOTE — Assessment & Plan Note (Signed)
Carotid duplex exam 05/06/2012 revealed no change since previous; left occluded carotid artery, right with 0-49% stenosis.  Headache has resolved.

## 2012-07-23 ENCOUNTER — Non-Acute Institutional Stay: Payer: Medicare Other | Admitting: Geriatric Medicine

## 2012-07-23 ENCOUNTER — Other Ambulatory Visit: Payer: Self-pay

## 2012-07-23 ENCOUNTER — Encounter: Payer: Self-pay | Admitting: Geriatric Medicine

## 2012-07-23 VITALS — BP 126/70 | HR 72 | Wt 172.0 lb

## 2012-07-23 DIAGNOSIS — J4489 Other specified chronic obstructive pulmonary disease: Secondary | ICD-10-CM

## 2012-07-23 DIAGNOSIS — I6529 Occlusion and stenosis of unspecified carotid artery: Secondary | ICD-10-CM

## 2012-07-23 DIAGNOSIS — I6522 Occlusion and stenosis of left carotid artery: Secondary | ICD-10-CM

## 2012-07-23 DIAGNOSIS — J449 Chronic obstructive pulmonary disease, unspecified: Secondary | ICD-10-CM

## 2012-07-23 DIAGNOSIS — Z7901 Long term (current) use of anticoagulants: Secondary | ICD-10-CM

## 2012-07-23 NOTE — Assessment & Plan Note (Signed)
Feels effect of pollen ih the air, but overall stable with current medications

## 2012-07-23 NOTE — Assessment & Plan Note (Signed)
INR remains subtherapeutic range, continue current Coumadin dose, recheck INR four-weeks

## 2012-07-23 NOTE — Progress Notes (Signed)
Patient ID: [name], female   DOB: Dec 19, 1919, 79 y.o.   MRN: [id number]    Chief Complaint  Patient presents with  . Medical Managment of Chronic Issues    coag check.  c/o increase frequence of urination, back pain, goes a little    HPI  This 79 year old female resident of [name], [place],  returns to clinic today for coagulation management.  C/o urinary frequency, small voids, back pain with BM- none of these problems are new. Pollen in the air is causing some mild worsening of her breathing, no significant respiratory symptoms.  Allergies Reviewed   Medications Reviewed   Data Reviewed    Radiology:   Lab [facility], external):  05/01/12 WBC 8.1, Hgb 10.7, Hct 34.6, Plt 300   Sed rate 36   CRP <0.5  05/29/2012: Glucose 98, BUN 21, creatinine 1.17, sodium 142, potassium 4.7. Protein/LFTs WNL.   Cholesterol 277, triglycerides 273, HDL 38, LDL 184   TSH 2.06   POCT  04/30/2012: INR 2.1  05/28/12: INR 2.3  06/25/12 INR 2.1  4/30 INR 2.4   Other:    REVIEW OF SYSTEMS  DATA OBTAINED: from patient,  GENERAL: Feels tired out. No fevers, change in activity staus, appetite or weight EYES: Very poor eyesight (macular degeneration), recent visit with Dr.Grevin, nothing new EARS: Decreased hearing RESPIRATORY: No cough, wheezing. DOE present CARDIAC: No chest pain, palpitations. No edema. GI: No abdominal pain. No N/V/D or constipation. No heartburn or reflux.  MUSCULOSKELETAL: No joint pain, swelling or stiffness. No back pain. No muscle ache, pain, weakness. Gait is steady. No recent falls.  NEUROLOGIC: No dizziness, fainting, headache, imbalance, numbness. No change in mental status.  PSYCHIATRIC: No anxiety, depression, behavior issue  PHYSICAL EXAM  Filed Vitals:   07/23/12 1534  BP: 126/70  Pulse: 72   Filed Weights   07/23/12 1534  Weight: 172 lb (78.019 kg)   GENERAL APPEARANCE: No acute distress, appropriately groomed, normal body  habitus. Alert, pleasant, conversant. HEAD: Normocephalic, atraumatic EYES: Conjunctiva/lids clear.  RESPIRATORY: Breathing is even, unlabored. Lung sounds are clear and full.  CARDIOVASCULAR: Heart RRR. Grade 3/4 holosystolic murmur (not new)    EDEMA: No peripheral or periorbital edema.   MUSCULOSKELETAL.  Gait is steady with walker PSYCHIATRIC: Mood and affect appropriate to situation.   ASSESSMENT/PLAN  Carotid artery occlusion Unchanged, aymptomatic  COPD Feels effect of pollen ih the air, but overall stable with current medications  Long term (current) use of anticoagulants INR remains subtherapeutic range, continue current Coumadin dose, recheck INR four-weeks   Follow up: 4 weeks   [name] Karen Shirk, NP-C 07/23/2012

## 2012-07-23 NOTE — Assessment & Plan Note (Signed)
Unchanged, aymptomatic

## 2012-08-04 ENCOUNTER — Non-Acute Institutional Stay: Payer: Medicare Other | Admitting: Geriatric Medicine

## 2012-08-04 ENCOUNTER — Encounter: Payer: Self-pay | Admitting: Geriatric Medicine

## 2012-08-04 DIAGNOSIS — R109 Unspecified abdominal pain: Secondary | ICD-10-CM

## 2012-08-04 NOTE — Assessment & Plan Note (Signed)
New onset abdominal pain in patient with history of diverticulitis, chronic constipation, recurrent UTIs. Appendicitis also is in the differential. No fever or nausea, is complaining of dysuria. Will obtain labs, abdominal x-ray as well as UA C&S. Monitor patient's vital signs next 48 hours

## 2012-08-04 NOTE — Progress Notes (Signed)
Patient ID: [name], female   DOB: 08-Apr-1919, 79 y.o.   MRN: [id number] Chief Complaint  Patient presents with  . Abdominal Pain    HPI: This is a 79 y.o. female resident of [name]  [facility].  Evaluation is requested today due to right-sided abdominal pain. The pain has been present for the last several days, patient has been afebrile, denies nausea vomiting. Bowel movements have been more sluggish than usual.   Allergies  Allergies  Allergen Reactions  . Cortisone Other (See Comments)    Unknown   . Minocin (Minocycline Hcl) Other (See Comments)    Unknown  . Minocycline Hcl   . Neomycin-Bacitracin Zn-Polymyx   . Neosporin (Neomycin-Bacitracin Zn-Polymyx) Other (See Comments)    Unknown  . Other Other (See Comments)    Steroidal Neuromuscular Blockers  . Tetanus Toxoids Other (See Comments)    Unknown   Medications Reviewed  Data Reviewed    Radiology:   Lab:([facility], external):  05/01/12 WBC 8.1, Hgb 10.7, Hct 34.6, Plt 300   Sed rate 36   CRP <0.5  05/29/2012: Glucose 98, BUN 21, creatinine 1.17, sodium 142, potassium 4.7. Protein/LFTs WNL.   Cholesterol 277, triglycerides 273, HDL 38, LDL 184   TSH 2.06     POCT  04/30/2012: INR 2.1  05/28/12: INR 2.3  06/25/12 INR 2.1 4/30 INR 2.4  Review of Systems  DATA OBTAINED: from patient, nurse, medical record GENERAL: l No fevers, fatigue, change in appetite or weight RESPIRATORY: No cough, wheezing, SOB CARDIAC: No chest pain, palpitations  No edema. GI: Abdominal pain - see HPI  No N/V/D. Chronic constipation  No heartburn or reflux  GU: No dysuria, frequency or urgency  No change in urine volume or character  NEUROLOGIC: No dizziness, fainting, headache,  No change in mental status.  PSYCHIATRIC: No feelings of anxiety, depression Sleeps well.     Physical Exam Filed Vitals:   08/04/12 2028  BP: 157/63  Pulse: 57  Temp: 97.7 F (36.5 C)  Resp: 18  SpO2: 97%    GENERAL APPEARANCE: No acute distress, appropriately groomed, normal body habitus. Alert, pleasant, conversant. HEAD: Normocephalic, atraumatic EYES: Conjunctiva/lids clear. Marland Kitchen  MOUTH/THROAT: Lips w/o lesions. Oral mucosa, tongue moist, w/o lesion. Oropharynx w/o redness or lesions.  NECK: Supple, full ROM. No thyroid tenderness, enlargement or nodule LYMPHATICS: No head, neck or supraclavicular adenopathy RESPIRATORY: Breathing is even, unlabored. Lung sounds are clear and full.  CARDIOVASCULAR: Heart RRR. No murmur or extra heart sounds  EDEMA: No peripheral or periorbital edema. No ascites GASTROINTESTINAL: Abdomen is soft, not distended w/ normal bowel sounds. Diffuse tenderness LLQ, RLQ.  GENITOURINARY: Bladder is tender, not distended. NEUROLOGIC: Oriented to time, place, person. Speech clear, no tremor.  PSYCHIATRIC: Mood and affect appropriate to situation  ASSESSMENT/PLAN  Abdominal  pain, other specified site New onset abdominal pain in patient with history of diverticulitis, chronic constipation, recurrent UTIs. Appendicitis also is in the differential. No fever or nausea, is complaining of dysuria. Will obtain labs, abdominal x-ray as well as UA C&S. Monitor patient's vital signs next 48 hours    Follow up: As needed  Akasia Ahmad T.Ruffin Lada, NP-C 08/04/2012

## 2012-08-20 ENCOUNTER — Encounter: Payer: Self-pay | Admitting: Geriatric Medicine

## 2012-08-20 ENCOUNTER — Non-Acute Institutional Stay: Payer: Medicare Other | Admitting: Geriatric Medicine

## 2012-08-20 VITALS — BP 104/64 | HR 68 | Ht 64.0 in | Wt 174.0 lb

## 2012-08-20 DIAGNOSIS — K5900 Constipation, unspecified: Secondary | ICD-10-CM

## 2012-08-20 DIAGNOSIS — Z7901 Long term (current) use of anticoagulants: Secondary | ICD-10-CM

## 2012-08-20 NOTE — Progress Notes (Signed)
Patient ID: [name], female   DOB: 07-14-1919, 79 y.o.   MRN: [id number] Patient ID: [name], female   DOB: 1919/07/30, 79 y.o.   MRN: [id number]    Chief Complaint  Patient presents with  . Medical Managment of Chronic Issues    coag check . c/o left wrist swollen for couple of weeks, no injury     HPI  This 79 year old female resident of [name], [place],  returns to clinic today for coagulation management.  Episode of Rt. Sided abdominal pain earlier this month. Xray/ lab negative for acute illness. Pain subsided. Continues to have difficulty with BM; small amounts "all day long with voiding". Once in  Awhile will have large BM, feels much better. Currently takes Miralax/ Senokot- S daily. .  Allergies Reviewed   Medications Reviewed   Data Reviewed    [facility] mobile x-ray 08/04/2012 KUB: No obstruction or ileus. Extensive atherosclerotic vascular calcifications of the abdominal aorta and branches.   Lab [facility], external):  05/01/12 WBC 8.1, Hgb 10.7, Hct 34.6, Plt 300   Sed rate 36   CRP <0.5  05/29/2012: Glucose 98, BUN 21, creatinine 1.17, sodium 142, potassium 4.7. Protein/LFTs WNL.   Cholesterol 277, triglycerides 273, HDL 38, LDL 184   TSH 2.06 08/04/2012 30 BC 7.8, hemoglobin 10.6, hematocrit 33.5, platelets 268  Glucose 102, BUN 20, creatinine 1.11, sodium 136, potassium 4.1  UA negative for signs of infection    POCT  05/28/12: INR 2.3  06/25/12 INR 2.1  4/30 INR 2.4  08/20/2012 INR 2.2     REVIEW OF SYSTEMS  DATA OBTAINED: from patient,  GENERAL: Feels tired out. No fevers, change in activity staus, appetite or weight EYES: Very poor eyesight (macular degeneration), recent visit with Dr.Grevin, nothing new EARS: Decreased hearing RESPIRATORY: No cough, wheezing. DOE present CARDIAC: No chest pain, palpitations. No edema. GI: No abdominal pain. No N/V/D or constipation. No heartburn or reflux.  MUSCULOSKELETAL: No joint  pain, swelling or stiffness. No back pain. No muscle ache, pain, weakness. Gait is steady. No recent falls.  NEUROLOGIC: No dizziness, fainting, headache, imbalance, numbness. No change in mental status.  PSYCHIATRIC: No anxiety, depression, behavior issue  PHYSICAL EXAM  Filed Vitals:   08/20/12 1547  BP: 104/64  Pulse: 68   Filed Weights   08/20/12 1547  Weight: 174 lb (78.926 kg)   GENERAL APPEARANCE: No acute distress, appropriately groomed, normal body habitus. Alert, pleasant, conversant. HEAD: Normocephalic, atraumatic EYES: Conjunctiva/lids clear.  RESPIRATORY: Breathing is even, unlabored. Lung sounds are clear and full.  CARDIOVASCULAR: Heart RRR. Grade 3/4 holosystolic murmur (not new)    EDEMA: No peripheral or periorbital edema.   MUSCULOSKELETAL.  Gait is steady with walker. Base of right thumb is mildly edematous/tender PSYCHIATRIC: Mood and affect appropriate to situation.   ASSESSMENT/PLAN  Long term (current) use of anticoagulants INR remains in therapeutic range, continue current warfarin dose, recheck INR 5 weeks  Unspecified constipation Patient continues to have unsatisfactory bowel movements despite laxatives. Will try adding Dulcolax suppository daily for better evacuation of the rectum.   Follow up: 5 weeks   Gabrella Stroh T.Morgaine Kimball, NP-C 08/20/2012

## 2012-08-27 ENCOUNTER — Encounter: Payer: Self-pay | Admitting: Geriatric Medicine

## 2012-08-27 ENCOUNTER — Non-Acute Institutional Stay: Payer: Medicare Other | Admitting: Geriatric Medicine

## 2012-08-27 VITALS — BP 110/68 | HR 60

## 2012-08-27 DIAGNOSIS — M461 Sacroiliitis, not elsewhere classified: Secondary | ICD-10-CM

## 2012-08-27 HISTORY — DX: Sacroiliitis, not elsewhere classified: M46.1

## 2012-08-27 MED ORDER — PREDNISONE 10 MG PO TABS: 10.0000 mg | ORAL_TABLET | ORAL | Status: DC

## 2012-08-27 NOTE — Progress Notes (Signed)
Patient ID: [name], female   DOB: 1919/09/26, 79 y.o.   MRN: [id number] [facility] [phone])  Chief Complaint  Patient presents with  . Acute Visit    "can't move legs" right leg , since Monday     HPI: This is a 79 y.o. female resident of [name].  Evaluation is requested today due to right leg weakness.  Patient reports she has been walking as usual with her walker short distances around her apartment, and then on Sunday noticed that her right leg was very weak, specifically she can't lift her leg. There is no pain with weightbearing. Continues to have chronic back pain.  Allergies  Allergies  Allergen Reactions  . Cortisone Other (See Comments)    Unknown   . Minocin (Minocycline Hcl) Other (See Comments)    Unknown  . Minocycline Hcl   . Neomycin-Bacitracin Zn-Polymyx   . Neosporin (Neomycin-Bacitracin Zn-Polymyx) Other (See Comments)    Unknown  . Other Other (See Comments)    Steroidal Neuromuscular Blockers  . Tetanus Toxoids Other (See Comments)    Unknown   Medications Reviewed  Review of Systems  DATA OBTAINED: from patient, GENERAL:  No fevers, fatigue, change in appetite or weight RESPIRATORY: No cough, wheezing, SOB CARDIAC: No chest pain, palpitations  No edema. GI: Mild occ. RLQ abdominal pain, mild nausea. Chronic constipation (suppository did not help)  No heartburn or reflux  GU: Mild dysuria (not new), Np frequency or urgency  No change in urine volume or character  MUSCULOSKELETAL: See HPI  Gait is unsteady  No recent falls.  NEUROLOGIC: No dizziness, fainting, headache No change in mental status.  PSYCHIATRIC: No feelings of anxiety, depression   Physical Exam Filed Vitals:   08/27/12 1458  BP: 110/68  Pulse: 60  SpO2: 97%    GENERAL APPEARANCE: No acute distress, appropriately groomed, normal body habitus. Alert, pleasant, conversant. HEAD: Normocephalic,  atraumatic RESPIRATORY: Breathing is even, unlabored.  GASTROINTESTINAL: Abdomen is soft, non-tender, not distended w/ normal bowel sounds. MUSCULOSKELETAL:  Moves LEFT leg with full ROM, strength and tone. Demonstrates a weak quadriceps on the right, unable to pick leg has full range of motion in her knee and ankle, full passive range of motion in her hip. Significant tenderness over bilateral SI joints and iliac crest. Gait is very unsteady, stifflegged NEUROLOGIC: Oriented to time, place, person.Speech clear, no tremor.  PSYCHIATRIC: Mood and affect appropriate to situation  ASSESSMENT/PLAN  Sacroiliitis Acute weakness in right quadricep associated with lower back pain and significant tenderness over SI joints and iliac crest. Leg weakness may be related to inflammation. Will try prednisone taper.   Follow up: As scheduled for INR  Chanette Demo T.Ruhan Borak, NP-C 08/27/2012

## 2012-08-27 NOTE — Assessment & Plan Note (Signed)
Acute weakness in right quadricep associated with lower back pain and significant tenderness over SI joints and iliac crest. Leg weakness may be related to inflammation. Will try prednisone taper.

## 2012-09-04 DIAGNOSIS — K5900 Constipation, unspecified: Secondary | ICD-10-CM | POA: Insufficient documentation

## 2012-09-04 NOTE — Assessment & Plan Note (Signed)
Patient continues to have unsatisfactory bowel movements despite laxatives. Will try adding Dulcolax suppository daily for better evacuation of the rectum.

## 2012-09-04 NOTE — Assessment & Plan Note (Signed)
INR remains in therapeutic range, continue current warfarin dose, recheck INR 5 weeks

## 2012-09-24 ENCOUNTER — Non-Acute Institutional Stay: Payer: Medicare Other | Admitting: Geriatric Medicine

## 2012-09-24 ENCOUNTER — Encounter: Payer: Self-pay | Admitting: Geriatric Medicine

## 2012-09-24 VITALS — BP 144/82 | HR 68 | Ht 64.0 in | Wt 170.0 lb

## 2012-09-24 DIAGNOSIS — M545 Low back pain: Secondary | ICD-10-CM

## 2012-09-24 DIAGNOSIS — M533 Sacrococcygeal disorders, not elsewhere classified: Secondary | ICD-10-CM

## 2012-09-24 DIAGNOSIS — M47817 Spondylosis without myelopathy or radiculopathy, lumbosacral region: Secondary | ICD-10-CM | POA: Insufficient documentation

## 2012-09-24 DIAGNOSIS — I6529 Occlusion and stenosis of unspecified carotid artery: Secondary | ICD-10-CM

## 2012-09-24 DIAGNOSIS — Z7901 Long term (current) use of anticoagulants: Secondary | ICD-10-CM

## 2012-09-24 DIAGNOSIS — I6522 Occlusion and stenosis of left carotid artery: Secondary | ICD-10-CM

## 2012-09-24 HISTORY — DX: Spondylosis without myelopathy or radiculopathy, lumbosacral region: M47.817

## 2012-09-24 NOTE — Assessment & Plan Note (Signed)
Unchanged, asymptomatic, continue anticoagulation for stroke risk reduction

## 2012-09-24 NOTE — Progress Notes (Signed)
Patient ID: [name], female   DOB: [birth year], 79 y.o.   MRN: [id number]    Chief Complaint  Patient presents with  . Medical Managment of Chronic Issues    coag check     HPI  This 79 year old female resident of [name], [place] section,  returns to clinic today for coagulation management. Patient has been taking warfarin as directed, no new medications or dietary changes. Last week she had increase in her chronic lower back pain. The on-call provider was notified, X-ray was ordered which did not show any acute changes. Patient was per prescribed Mobic, this was changed to by a freeze gel due to concern for increased bleeding risk. Patient reports the Biogel bio freeze gel provides minimal comfort for a few minutes afterwards applied. She is requesting b.i.d. dosing of Tylenol. .  Allergies Reviewed     Medications Reviewed   Data Reviewed     [facility] mobile x-ray 08/04/2012 KUB: No obstruction or ileus. Extensive atherosclerotic vascular calcifications of the abdominal aorta and branches.  09/18/2012 X-ray right hip:   Mild to moderate diffuse osteopenia. No acute fracture, malalignment or lytic destructive lesion  X-ray lumbar spine:  Moderate diffuse osteopenia. Mild to moderate osteoarthritis and degenerative spondylosis cyst seen diffusely. Anterior subluxation of L4 relative to L5. No dislocation, lytic destructive lesion or vertebral body compression deformity. Mild to moderate levoconvex curvature midlumbar spine    Lab ([facility], external):  05/01/12 WBC 8.1, Hgb 10.7, Hct 34.6, Plt 300   Sed rate 36   CRP <0.5  05/29/2012: Glucose 98, BUN 21, creatinine 1.17, sodium 142, potassium 4.7. Protein/LFTs WNL.   Cholesterol 277, triglycerides 273, HDL 38, LDL 184   TSH 2.06 08/04/2012 30 BC 7.8, hemoglobin 10.6, hematocrit 33.5, platelets 268  Glucose 102, BUN 20, creatinine 1.11, sodium 136, potassium 4.1  UA negative for signs of  infection    POCT  05/28/12: INR 2.3  06/25/12 INR 2.1  4/30 INR 2.4  08/20/12 INR 2.2  09/24/12 INR 3.5     REVIEW OF SYSTEMS  DATA OBTAINED: from patient,  GENERAL: Feels tired out. No fevers, change in activity staus, appetite or weight EYES: Very poor eyesight (macular degeneration), recent visit with Dr.Grevin, nothing new EARS: Decreased hearing RESPIRATORY: No cough, wheezing. DOE present CARDIAC: No chest pain, palpitations. No edema. GI: No abdominal pain. No N/V/D or constipation. No heartburn or reflux.  MUSCULOSKELETAL: No joint pain, swelling or stiffness. Back pain present with any activity. . Gait is steady w/ walker. No recent falls.  NEUROLOGIC: No dizziness, fainting, headache. No change in mental status.  PSYCHIATRIC: No anxiety, depression, behavior issue  PHYSICAL EXAM  Filed Vitals:   09/24/12 1546  BP: 144/82  Pulse: 68   Filed Weights   09/24/12 1546  Weight: 170 lb (77.111 kg)   GENERAL APPEARANCE: No acute distress, appropriately groomed, normal body habitus. Alert, pleasant, conversant. HEAD: Normocephalic, atraumatic EYES: Conjunctiva/lids clear.  RESPIRATORY: Breathing is even, unlabored. Lung sounds are clear and full.  CARDIOVASCULAR: Heart RRR. Grade 3/4 holosystolic murmur (not new)    EDEMA: No peripheral or periorbital edema.   MUSCULOSKELETAL.  Tenderness across the entire lumbosacral region, also tender at the posterior iliac crests bilaterally. Gait is steady with walker.  PSYCHIATRIC: Mood and affect appropriate to situation.   ASSESSMENT/PLAN  Back pain, lumbosacral Back pain with any activity, tenderness along the entire lumbosacral region and posterior iliac crest. Pain may be due to inflammation related to arthritis. Freeze gel  is not providing any significant relief. Will try topical NSAID as  this is a safer alternative than n.p.o. At b.i.d. dosing Tylenol as well.  Carotid artery occlusion Unchanged, asymptomatic, continue  anticoagulation for stroke risk reduction  Long term (current) use of anticoagulants INR is above goal of 1.8-2.5, patient does report some increase in her chronic rectal bleeding. Reduce warfarin dose, recheck INR in 2 weeks   Follow up: 2 weeks  Tatum Corl T.Khala Tarte, NP-C 09/24/2012

## 2012-09-24 NOTE — Assessment & Plan Note (Signed)
INR is above goal of 1.8-2.5, patient does report some increase in her chronic rectal bleeding. Reduce warfarin dose, recheck INR in 2 weeks

## 2012-09-24 NOTE — Assessment & Plan Note (Signed)
Back pain with any activity, tenderness along the entire lumbosacral region and posterior iliac crest. Pain may be due to inflammation related to arthritis. Freeze gel is not providing any significant relief. Will try topical NSAID as  this is a safer alternative than n.p.o. At b.i.d. dosing Tylenol as well.

## 2012-10-08 ENCOUNTER — Encounter: Payer: Self-pay | Admitting: Geriatric Medicine

## 2012-10-08 ENCOUNTER — Non-Acute Institutional Stay: Payer: Medicare Other | Admitting: Geriatric Medicine

## 2012-10-08 VITALS — BP 132/70 | HR 62 | Ht 64.0 in | Wt 172.0 lb

## 2012-10-08 DIAGNOSIS — L299 Pruritus, unspecified: Secondary | ICD-10-CM | POA: Insufficient documentation

## 2012-10-08 DIAGNOSIS — I6522 Occlusion and stenosis of left carotid artery: Secondary | ICD-10-CM

## 2012-10-08 DIAGNOSIS — M461 Sacroiliitis, not elsewhere classified: Secondary | ICD-10-CM

## 2012-10-08 DIAGNOSIS — I6529 Occlusion and stenosis of unspecified carotid artery: Secondary | ICD-10-CM

## 2012-10-08 DIAGNOSIS — Z7901 Long term (current) use of anticoagulants: Secondary | ICD-10-CM

## 2012-10-08 MED ORDER — HYDROXYZINE HCL 25 MG PO TABS: 50.0000 mg | ORAL_TABLET | Freq: Every day | ORAL | Status: DC

## 2012-10-08 NOTE — Assessment & Plan Note (Addendum)
Back pain is not improved with p.o. medications (including prednisone) or topical analgesics. Will ask physical therapy to assess for pain relieving strategies. Patient will also consider massage therapy

## 2012-10-08 NOTE — Progress Notes (Signed)
Patient ID: [name], female   DOB: 01-04-1920, 79 y.o.   MRN: [id number]    Chief Complaint  Patient presents with  . Medical Managment of Chronic Issues    coag check.  c/o back still hurts, still itching all over    HPI  This 79 year old female resident of [name], [place] section,  returns to clinic today for coagulation management. Patient has been taking warfarin as directed, no new medications or dietary changes. Warfarin dose was decreased at last visit due to mildly elevated INR. Back pain has not improved with addition of Voltaren gel. LS X-ray was  did not show any acute changes. Previous analgesic measures include  Biogel freeze gel, Voltaren gel, Tylenol. Ibuprofen was Rx but not given due to increased bleeding risk.  Patient reports back pain is not improved with any of these measures , in fact she tells me that the pain is excruciating, present all the time. In June 2014 prednisone taper was tried for similar pain, patient reports this was not useful and she did not like the way the prednisone mad her feel. Patient is requesting an alternate medication for itching. Skin and scalp are very itchy especially at night. Currently receiving Zyrtec daily, also has several topical agents to reduce itching.  Allergies  Allergies  Allergen Reactions  . Cortisone Other (See Comments)    HAS taken prednisone in past without problem  . Minocin (Minocycline Hcl) Other (See Comments)    Unknown  . Minocycline Hcl   . Neomycin-Bacitracin Zn-Polymyx   . Neosporin (Neomycin-Bacitracin Zn-Polymyx) Other (See Comments)    Unknown  . Other Other (See Comments)    Steroidal Neuromuscular Blockers  . Tetanus Toxoids Other (See Comments)    Unknown   Medications    Medication List       This list is accurate as of: 10/08/12  5:39 PM.  Always use your most recent med list.               acetaminophen 325 MG tablet  Commonly known as:  TYLENOL  Take 650 mg by  mouth every 6 (six) hours as needed. For pain     acyclovir ointment 5 %  Commonly known as:  ZOVIRAX  Apply 1 application topically every 3 (three) hours as needed. For cold sores     amoxicillin 500 MG capsule  Commonly known as:  AMOXIL  Take 2,000 mg by mouth once. Prior to dental procedure     betamethasone valerate ointment 0.1 %  Commonly known as:  VALISONE  Apply topically 2 (two) times daily. Apply to itching areas daily     chlorhexidine 0.12 % solution  Commonly known as:  PERIDEX  Use as directed 15 mLs in the mouth or throat. swish 15 mls for 30 sec then spit, twice daily     CLODERM 0.1 % cream  Generic drug:  Clocortolone Pivalate  Apply 1 application topically 2 (two) times daily. Apply to lesion on left cheek     desonide 0.05 % cream  Commonly known as:  DESOWEN     donepezil 10 MG tablet  Commonly known as:  ARICEPT  Take 10 mg by mouth daily.     escitalopram 20 MG tablet  Commonly known as:  LEXAPRO  Take 20 mg by mouth daily.     estrogens (conjugated) 0.3 MG tablet  Commonly known as:  PREMARIN  Take 0.3 mg by mouth daily. Take daily for 21 days then do  not take for 7 days.     fluticasone 50 MCG/ACT nasal spray  Commonly known as:  FLONASE  Place 2 sprays into the nose daily as needed. For dry nasal passages     folic acid 1 MG tablet  Commonly known as:  FOLVITE  Take 1 mg by mouth daily.     gabapentin 100 MG capsule  Commonly known as:  NEURONTIN  Take 100 mg by mouth. Take 2 caps at bedtime     hydrocortisone-pramoxine 2.5-1 % rectal cream  Commonly known as:  ANALPRAM-HC  Place 1 application rectally 2 (two) times daily as needed. For hemorrhoids     hydrOXYzine 25 MG tablet  Commonly known as:  ATARAX/VISTARIL  Take 2 tablets (50 mg total) by mouth at bedtime. May have additional 25 mg every 8 hours as needed for itching     ipratropium-albuterol 0.5-2.5 (3) MG/3ML Soln  Commonly known as:  DUONEB  Take 3 mLs by nebulization  every 6 (six) hours as needed. For wheezing and shortness of breath     levothyroxine 88 MCG tablet  Commonly known as:  SYNTHROID, LEVOTHROID  Take 88 mcg by mouth daily before breakfast. Take one tablet daily for thyroid     olopatadine 0.1 % ophthalmic solution  Commonly known as:  PATANOL  Place 1 drop into both eyes 2 (two) times daily as needed. For dry eyes     polyethylene glycol packet  Commonly known as:  MIRALAX / GLYCOLAX  Take 17 g by mouth daily.     sennosides-docusate sodium 8.6-50 MG tablet  Commonly known as:  SENOKOT-S  Take 1 tablet by mouth. Take 2 tabs at bedtime, take one as needed     SYSTANE 0.4-0.3 % Soln  Generic drug:  Polyethyl Glycol-Propyl Glycol  Place 1 drop into both eyes daily as needed. For dry eyes     tiotropium 18 MCG inhalation capsule  Commonly known as:  SPIRIVA  Place 18 mcg into inhaler and inhale daily.     Vitamin D 2000 UNITS Caps  Take 1 capsule by mouth daily.     warfarin 5 MG tablet  Commonly known as:  COUMADIN  Take 5 mg by mouth daily. Take one T, Th, Sat; take 6mg  S, M, W, F        Data Reviewed     Quality mobile x-ray 08/04/2012 KUB: No obstruction or ileus. Extensive atherosclerotic vascular calcifications of the abdominal aorta and branches.  09/18/2012 X-ray right hip:   Mild to moderate diffuse osteopenia. No acute fracture, malalignment or lytic destructive lesion  X-ray lumbar spine:  Moderate diffuse osteopenia. Mild to moderate osteoarthritis and degenerative spondylosis cyst seen diffusely. Anterior subluxation of L4 relative to L5. No dislocation, lytic destructive lesion or vertebral body compression deformity. Mild to moderate levoconvex curvature midlumbar spine    Lab ([facility], external):  05/01/12 WBC 8.1, Hgb 10.7, Hct 34.6, Plt 300   Sed rate 36   CRP <0.5  05/29/2012: Glucose 98, BUN 21, creatinine 1.17, sodium 142, potassium 4.7. Protein/LFTs WNL.   Cholesterol 277, triglycerides 273, HDL 38,  LDL 184   TSH 2.06 08/04/2012 30 BC 7.8, hemoglobin 10.6, hematocrit 33.5, platelets 268  Glucose 102, BUN 20, creatinine 1.11, sodium 136, potassium 4.1  UA negative for signs of infection    POCT  05/28/12: INR 2.3  06/25/12 INR 2.1  4/30 INR 2.4  08/20/12 INR 2.2  09/24/12 INR 3.5   7/16/INR 2.8  REVIEW OF SYSTEMS  DATA OBTAINED: from patient,  GENERAL: Feels tired out. No fevers, change in activity staus, appetite or weight SKIN: itchy, dry skin EYES: Very poor eyesight (macular degeneration), recent visit with Dr.Grevin, nothing new EARS: Decreased hearing RESPIRATORY: No cough, wheezing. DOE present CARDIAC: No chest pain, palpitations. No edema. GI: No abdominal pain. No N/V/D or constipation. No heartburn or reflux.  MUSCULOSKELETAL: No joint pain, swelling or stiffness. Back pain present with any activity. . Gait is steady w/ walker. No recent falls.  NEUROLOGIC: No dizziness, fainting, headache. No change in mental status.  PSYCHIATRIC: No anxiety, depression, behavior issue  PHYSICAL EXAM  Filed Vitals:   10/08/12 1442  BP: 132/70  Pulse: 62   Filed Weights   10/08/12 1442  Weight: 172 lb (78.019 kg)   GENERAL APPEARANCE: No acute distress, appropriately groomed, normal body habitus. Alert, pleasant, conversant. SKIN: No rash, unusual lesion, wounds HEAD: Normocephalic, atraumatic EYES: Conjunctiva/lids clear.  RESPIRATORY: Breathing is even, unlabored. Lung sounds are clear and full.  CARDIOVASCULAR: Heart RRR. Grade 3/4 holosystolic murmur (not new)    EDEMA: No peripheral or periorbital edema.   MUSCULOSKELETAL.  Tenderness across the entire lumbosacral region, also tender at the posterior iliac crests bilaterally. Gait is steady with walker.  PSYCHIATRIC: Mood and affect appropriate to situation.   ASSESSMENT/PLAN  Sacroiliitis Back pain is not improved with p.o. medications (including prednisone) or topical analgesics. Will ask physical therapy to assess  for pain relieving strategies. Patient will also consider massage therapy  Pruritus Change antihistamine from Zyrtec to hydroxyzine. Dose at bedtime with p.r.n. as well.  Long term (current) use of anticoagulants INR back in therapeutic range, continue current warfarin dose, next INR 3 weeks   Carotid artery occlusion Asymptomatic, continue anticoagulation   Follow up: 3 weeks  Avagail Whittlesey T.Warda Mcqueary, NP-C 10/08/2012

## 2012-10-08 NOTE — Assessment & Plan Note (Addendum)
INR back in therapeutic range, continue current warfarin dose, next INR 3 weeks

## 2012-10-08 NOTE — Assessment & Plan Note (Signed)
Asymptomatic, continue anticoagulation 

## 2012-10-08 NOTE — Assessment & Plan Note (Addendum)
Change antihistamine from Zyrtec to hydroxyzine. Dose at bedtime with p.r.n. as well.

## 2012-10-29 ENCOUNTER — Non-Acute Institutional Stay: Payer: Medicare Other | Admitting: Geriatric Medicine

## 2012-10-29 ENCOUNTER — Encounter: Payer: Self-pay | Admitting: Geriatric Medicine

## 2012-10-29 VITALS — BP 114/62 | HR 72 | Ht 64.0 in | Wt 173.4 lb

## 2012-10-29 DIAGNOSIS — M533 Sacrococcygeal disorders, not elsewhere classified: Secondary | ICD-10-CM

## 2012-10-29 DIAGNOSIS — Z7901 Long term (current) use of anticoagulants: Secondary | ICD-10-CM

## 2012-10-29 DIAGNOSIS — M545 Low back pain, unspecified: Secondary | ICD-10-CM

## 2012-10-29 DIAGNOSIS — Z8673 Personal history of transient ischemic attack (TIA), and cerebral infarction without residual deficits: Secondary | ICD-10-CM

## 2012-10-29 NOTE — Progress Notes (Signed)
Patient ID: [name], female   DOB: September 25, 1919, 79 y.o.   MRN: [id number] [name]    Chief Complaint  Patient presents with  . Medical Managment of Chronic Issues    coag check. On 7/27 was out with son, suddenly vision when crazy, pressure pain in chest, SOB, weak, had to be held up. Lasted 45-60 minutes.    HPI  This 79 year old female resident of [name], [place] section,  returns to clinic today for coagulation management. Patient has been taking warfarin as directed, no new medications or dietary changes.  This patient had an episode that was similar to previous TIAs. Last week she was out with her daughter and son-in-law, she notes that her vision went "crazy", had some pressure and pain in her head, was short of breath and weak. This episode lasted about 45-60 minutes, and resolved. She's not had any focal neurologic changes since that time. She does report that she was quite tired and weak and spent the next day in bed. Back pain did not improve with p.o. or topical analgesics. Have recommended physical therapy. Patient has had one visit from the therapist, treated her with some heat and some exercises. She did feel some relief at the time. Continues to have the severe back pain whenever she rises from a sitting position.  This patient is scheduled for some dental work on August 26. She has loose front teeth that will need to be extracted and new ones placed as part of the bridge. The periodontist is Dr. Manson Passey. He has recommended preoperative antibiotics, will also want to check a preoperative INR. He does not feel Coumadin needs to be stopped for this procedure.  Allergies  Allergies  Allergen Reactions  . Cortisone Other (See Comments)    HAS taken prednisone in past without problem  . Minocin (Minocycline Hcl) Other (See Comments)    Unknown  . Minocycline Hcl   . Neomycin-Bacitracin Zn-Polymyx   . Neosporin  (Neomycin-Bacitracin Zn-Polymyx) Other (See Comments)    Unknown  . Other Other (See Comments)    Steroidal Neuromuscular Blockers  . Tetanus Toxoids Other (See Comments)    Unknown   Medications    Medication List       This list is accurate as of: 10/29/12  2:46 PM.  Always use your most recent med list.               acetaminophen 325 MG tablet  Commonly known as:  TYLENOL  Take 650 mg by mouth every 6 (six) hours as needed. For pain. Take 2 twice daily for pain     acyclovir ointment 5 %  Commonly known as:  ZOVIRAX  Apply 1 application topically every 3 (three) hours as needed. For cold sores     amoxicillin 500 MG capsule  Commonly known as:  AMOXIL  Take 2,000 mg by mouth once. Prior to dental procedure     betamethasone valerate ointment 0.1 %  Commonly known as:  VALISONE  Apply topically 2 (two) times daily. Apply to itching areas daily     cephALEXin 500 MG capsule  Commonly known as:  KEFLEX  Take one three times daily, take 2 once a day     chlorhexidine 0.12 % solution  Commonly known as:  PERIDEX  Use as directed 15 mLs in the mouth or throat. swish 15 mls for 30 sec then spit, twice daily     CLODERM 0.1 % cream  Generic drug:  Clocortolone Pivalate  Apply 1 application topically 2 (two) times daily. Apply to lesion on left cheek     desonide 0.05 % cream  Commonly known as:  DESOWEN     donepezil 10 MG tablet  Commonly known as:  ARICEPT  Take 10 mg by mouth daily.     escitalopram 20 MG tablet  Commonly known as:  LEXAPRO  Take 20 mg by mouth daily.     estrogens (conjugated) 0.3 MG tablet  Commonly known as:  PREMARIN  Take 0.3 mg by mouth daily. Take daily for 21 days then do not take for 7 days.     fluticasone 50 MCG/ACT nasal spray  Commonly known as:  FLONASE  Place 2 sprays into the nose daily as needed. For dry nasal passages     folic acid 1 MG tablet  Commonly known as:  FOLVITE  Take 1 mg by mouth daily.     gabapentin  100 MG capsule  Commonly known as:  NEURONTIN  Take 100 mg by mouth. Take 2 caps at bedtime     hydrocortisone-pramoxine 2.5-1 % rectal cream  Commonly known as:  ANALPRAM-HC  Place 1 application rectally 2 (two) times daily as needed. For hemorrhoids     hydrOXYzine 25 MG tablet  Commonly known as:  ATARAX/VISTARIL  Take 2 tablets (50 mg total) by mouth at bedtime. May have additional 25 mg every 8 hours as needed for itching     ipratropium-albuterol 0.5-2.5 (3) MG/3ML Soln  Commonly known as:  DUONEB  Take 3 mLs by nebulization every 6 (six) hours as needed. For wheezing and shortness of breath     levothyroxine 88 MCG tablet  Commonly known as:  SYNTHROID, LEVOTHROID  Take 88 mcg by mouth daily before breakfast. Take one tablet daily for thyroid     olopatadine 0.1 % ophthalmic solution  Commonly known as:  PATANOL  Place 1 drop into both eyes 2 (two) times daily as needed. For dry eyes     OVER THE COUNTER MEDICATION  Cera Ve 1.3, 6-11 apply to dry skin daily     polyethylene glycol packet  Commonly known as:  MIRALAX / GLYCOLAX  Take 17 g by mouth daily.     sennosides-docusate sodium 8.6-50 MG tablet  Commonly known as:  SENOKOT-S  Take 1 tablet by mouth. Take 2 tabs at bedtime, take one as needed     SYSTANE 0.4-0.3 % Soln  Generic drug:  Polyethyl Glycol-Propyl Glycol  Place 1 drop into both eyes daily as needed. For dry eyes     tiotropium 18 MCG inhalation capsule  Commonly known as:  SPIRIVA  Place 18 mcg into inhaler and inhale daily.     Vitamin D 2000 UNITS Caps  Take 1 capsule by mouth daily.     warfarin 5 MG tablet  Commonly known as:  COUMADIN  Take 5 mg by mouth daily. Take one M, T, W,  Th, Sat; take 6mg  Sun, F        Data Reviewed     [facility] mobile x-ray 08/04/2012 KUB: No obstruction or ileus. Extensive atherosclerotic vascular calcifications of the abdominal aorta and branches.  09/18/2012 X-ray right hip:   Mild to moderate diffuse  osteopenia. No acute fracture, malalignment or lytic destructive lesion  X-ray lumbar spine:  Moderate diffuse osteopenia. Mild to moderate osteoarthritis and degenerative spondylosis cyst seen diffusely. Anterior subluxation of L4 relative to L5. No dislocation, lytic destructive lesion or vertebral body  compression deformity. Mild to moderate levoconvex curvature midlumbar spine    Lab ([facility], external):  05/01/12 WBC 8.1, Hgb 10.7, Hct 34.6, Plt 300   Sed rate 36   CRP <0.5  05/29/2012: Glucose 98, BUN 21, creatinine 1.17, sodium 142, potassium 4.7. Protein/LFTs WNL.   Cholesterol 277, triglycerides 273, HDL 38, LDL 184   TSH 2.06 08/04/2012 30 BC 7.8, hemoglobin 10.6, hematocrit 33.5, platelets 268  Glucose 102, BUN 20, creatinine 1.11, sodium 136, potassium 4.1  UA negative for signs of infection    POCT  05/28/12: INR 2.3  06/25/12 INR 2.1  4/30 INR 2.4  08/20/12 INR 2.2  09/24/12 INR 3.5   7/16/INR 2.8  10/29/2012 INR 3.0     REVIEW OF SYSTEMS  DATA OBTAINED: from patient,  GENERAL: Feels tired out. No fevers, change in activity staus, appetite or weight SKIN: itchy, dry skin EYES: Very poor eyesight (macular degeneration), recent visit with Dr.Grevin, nothing new EARS: Decreased hearing RESPIRATORY: No cough, wheezing. DOE present CARDIAC: No chest pain, palpitations. No edema. GI: No abdominal pain. No N/V/D or constipation. No heartburn or reflux.  MUSCULOSKELETAL: No joint pain, swelling or stiffness. Back pain present with any activity. . Gait is steady w/ walker. No recent falls.  NEUROLOGIC: No dizziness, fainting, headache. No change in mental status.  PSYCHIATRIC: No anxiety, depression, behavior issue  PHYSICAL EXAM  Filed Vitals:   10/29/12 1429  BP: 114/62  Pulse: 72   Filed Weights   10/29/12 1429  Weight: 173 lb 6.4 oz (78.654 kg)  Body mass index is 29.75 kg/(m^2).  GENERAL APPEARANCE: No acute distress, appropriately groomed, normal body habitus. Alert,  pleasant, conversant. SKIN: No rash, unusual lesion, wounds HEAD: Normocephalic, atraumatic EYES: Conjunctiva/lids clear.  RESPIRATORY: Breathing is even, unlabored. Lung sounds are clear and full.  CARDIOVASCULAR: Heart RRR. Grade 3/4 holosystolic murmur (not new)    EDEMA: No peripheral or periorbital edema.   MUSCULOSKELETAL.  Tenderness across the entire lumbosacral region, also tender at the posterior iliac crests bilaterally. Gait is steady with walker.  PSYCHIATRIC: Mood and affect appropriate to situation.   ASSESSMENT/PLAN  No problem-specific assessment & plan notes found for this encounter.  Follow up: 3 weeks  Judaea Burgoon T.Zakaria Sedor, NP-C 10/29/2012

## 2012-11-04 MED ORDER — WARFARIN SODIUM 5 MG PO TABS: 5.0000 mg | ORAL_TABLET | Freq: Every day | ORAL | Status: DC

## 2012-11-04 NOTE — Assessment & Plan Note (Signed)
Patient's symptoms of July 27 do sound consistent with TIA. Patient has had previous TIAs, has a known occluded carotid artery. She is anticoagulated. No further workup or treatment at this time.

## 2012-11-04 NOTE — Assessment & Plan Note (Signed)
Back pain continues to be a mobility limiting continue physical therapy interventions with hopes this will reduce discomfort and increase functional status.

## 2012-11-04 NOTE — Assessment & Plan Note (Addendum)
INR is a bit high for this patient, INR goal is 1.8-2.5. Reduce warfarin to 5 mg daily, next INR 2 weeks.

## 2012-11-12 ENCOUNTER — Non-Acute Institutional Stay: Payer: Medicare Other | Admitting: Geriatric Medicine

## 2012-11-12 ENCOUNTER — Encounter: Payer: Self-pay | Admitting: Geriatric Medicine

## 2012-11-12 VITALS — BP 122/62 | HR 68 | Ht 64.0 in | Wt 172.0 lb

## 2012-11-12 DIAGNOSIS — M545 Low back pain: Secondary | ICD-10-CM

## 2012-11-12 DIAGNOSIS — Z7901 Long term (current) use of anticoagulants: Secondary | ICD-10-CM

## 2012-11-12 DIAGNOSIS — Z8673 Personal history of transient ischemic attack (TIA), and cerebral infarction without residual deficits: Secondary | ICD-10-CM

## 2012-11-12 DIAGNOSIS — M533 Sacrococcygeal disorders, not elsewhere classified: Secondary | ICD-10-CM

## 2012-11-12 NOTE — Assessment & Plan Note (Signed)
INR remains in therapeutic range, continue current warfarin dose. Will ask nurse to fax recent Coumadin flow sheet to Dr. Manson Passey for his review prior to dental surgery.

## 2012-11-12 NOTE — Assessment & Plan Note (Signed)
No recurrence of TIA symptoms, continue anticoagulation

## 2012-11-12 NOTE — Progress Notes (Signed)
Patient ID: [name], female   DOB: 23-Oct-1919, 79 y.o.   MRN: [id number] [facility]   Code Status: DNR Contact Information   Name Relation Home Work Mobile   [name] Friend [phone]     [name] [phone]        Chief Complaint  Patient presents with  . Medical Managment of Chronic Issues    coag check     HPI  This 79 year old female resident of [name], [place] section,  returns to clinic today for coagulation management. Patient has been taking warfarin as directed, no new medications or dietary changes. This patient is scheduled for dental work on August 26. She has loose front teeth that will need to be extracted and new ones placed as part of the bridge. The periodontist is Dr. Manson Passey. He has recommended preoperative antibiotics, will also want to check a preoperative INR. He does not feel Coumadin needs to be stopped for this procedure.  Prior to last visit this patient had an episode that was similar to previous TIAs. No recurrence of the symptoms.  Back pain did not improve with p.o. or topical analgesics. Patient has been participating with physical therapy, reports she feels like it is helping somewhat.    Allergies  Allergies  Allergen Reactions  . Cortisone Other (See Comments)    HAS taken prednisone in past without problem  . Minocin [Minocycline Hcl] Other (See Comments)    Unknown  . Minocycline Hcl   . Neomycin-Bacitracin Zn-Polymyx   . Neosporin [Neomycin-Bacitracin Zn-Polymyx] Other (See Comments)    Unknown  . Other Other (See Comments)    Steroidal Neuromuscular Blockers  . Tetanus Toxoids Other (See Comments)    Unknown   Medications    Medication List       This list is accurate as of: 11/12/12  3:43 PM.  Always use your most recent med list.               acetaminophen 325 MG tablet  Commonly known as:  TYLENOL  Take 650 mg by mouth every 6 (six) hours as needed.  For pain. Take 2 twice daily for pain     acyclovir ointment 5 %  Commonly known as:  ZOVIRAX  Apply 1 application topically every 3 (three) hours as needed. For cold sores     amoxicillin 500 MG capsule  Commonly known as:  AMOXIL  Take 2,000 mg by mouth once. Prior to dental procedure     betamethasone valerate ointment 0.1 %  Commonly known as:  VALISONE  Apply topically 2 (two) times daily. Apply to itching areas daily     cephALEXin 500 MG capsule  Commonly known as:  KEFLEX  Take one three times daily, take 2 once a day     chlorhexidine 0.12 % solution  Commonly known as:  PERIDEX  Use as directed 15 mLs in the mouth or throat. swish 15 mls for 30 sec then spit, twice daily     CLODERM 0.1 % cream  Generic drug:  Clocortolone Pivalate  Apply 1 application topically 2 (two) times daily. Apply to lesion on left cheek     desonide 0.05 % cream  Commonly known as:  DESOWEN     donepezil 10 MG tablet  Commonly known as:  ARICEPT  Take 10 mg by mouth daily.     escitalopram 20 MG tablet  Commonly known as:  LEXAPRO  Take 20 mg by mouth daily.  estrogens (conjugated) 0.3 MG tablet  Commonly known as:  PREMARIN  Take 0.3 mg by mouth daily. Take daily for 21 days then do not take for 7 days.     fluticasone 50 MCG/ACT nasal spray  Commonly known as:  FLONASE  Place 2 sprays into the nose daily as needed. For dry nasal passages     folic acid 1 MG tablet  Commonly known as:  FOLVITE  Take 1 mg by mouth daily.     gabapentin 100 MG capsule  Commonly known as:  NEURONTIN  Take 100 mg by mouth. Take 2 caps at bedtime     hydrocortisone-pramoxine 2.5-1 % rectal cream  Commonly known as:  ANALPRAM-HC  Place 1 application rectally 2 (two) times daily as needed. For hemorrhoids     hydrOXYzine 25 MG tablet  Commonly known as:  ATARAX/VISTARIL  Take 2 tablets (50 mg total) by mouth at bedtime. May have additional 25 mg every 8 hours as needed for itching      ipratropium-albuterol 0.5-2.5 (3) MG/3ML Soln  Commonly known as:  DUONEB  Take 3 mLs by nebulization every 6 (six) hours as needed. For wheezing and shortness of breath     levothyroxine 88 MCG tablet  Commonly known as:  SYNTHROID, LEVOTHROID  Take 88 mcg by mouth daily before breakfast. Take one tablet daily for thyroid     olopatadine 0.1 % ophthalmic solution  Commonly known as:  PATANOL  Place 1 drop into both eyes 2 (two) times daily as needed. For dry eyes     OVER THE COUNTER MEDICATION  Cera Ve 1.3, 6-11 apply to dry skin daily     polyethylene glycol packet  Commonly known as:  MIRALAX / GLYCOLAX  Take 17 g by mouth daily.     sennosides-docusate sodium 8.6-50 MG tablet  Commonly known as:  SENOKOT-S  Take 1 tablet by mouth. Take 2 tabs at bedtime, take one as needed     SYSTANE 0.4-0.3 % Soln  Generic drug:  Polyethyl Glycol-Propyl Glycol  Place 1 drop into both eyes daily as needed. For dry eyes     tiotropium 18 MCG inhalation capsule  Commonly known as:  SPIRIVA  Place 18 mcg into inhaler and inhale daily.     Vitamin D 2000 UNITS Caps  Take 1 capsule by mouth daily.     warfarin 5 MG tablet  Commonly known as:  COUMADIN  Take 1 tablet (5 mg total) by mouth daily. Take one tablet daily or as directed for anticoagulation        Data Reviewed     [facility] x-ray 08/04/2012 KUB: No obstruction or ileus. Extensive atherosclerotic vascular calcifications of the abdominal aorta and branches.  09/18/2012 X-ray right hip:   Mild to moderate diffuse osteopenia. No acute fracture, malalignment or lytic destructive lesion  X-ray lumbar spine:  Moderate diffuse osteopenia. Mild to moderate osteoarthritis and degenerative spondylosis cyst seen diffusely. Anterior subluxation of L4 relative to L5. No dislocation, lytic destructive lesion or vertebral body compression deformity. Mild to moderate levoconvex curvature midlumbar spine    Lab ([facility],  external):  05/01/12 WBC 8.1, Hgb 10.7, Hct 34.6, Plt 300   Sed rate 36   CRP <0.5  05/29/2012: Glucose 98, BUN 21, creatinine 1.17, sodium 142, potassium 4.7. Protein/LFTs WNL.   Cholesterol 277, triglycerides 273, HDL 38, LDL 184   TSH 2.06 08/04/2012 30 BC 7.8, hemoglobin 10.6, hematocrit 33.5, platelets 268  Glucose 102, BUN 20,  creatinine 1.11, sodium 136, potassium 4.1  UA negative for signs of infection    POCT  05/28/12: INR 2.3  06/25/12 INR 2.1  4/30 INR 2.4  08/20/12 INR 2.2  09/24/12 INR 3.5   7/16/INR 2.8  10/29/2012 INR 3.0  8/20 INR 2.4     REVIEW OF SYSTEMS  DATA OBTAINED: from patient,  GENERAL: Feels tired out. No fevers, change in activity staus, appetite or weight SKIN: itchy, dry skin EYES: Very poor eyesight (macular degeneration), recent visit with Dr.Grevin, nothing new EARS: Decreased hearing RESPIRATORY: No cough, wheezing. DOE present CARDIAC: No chest pain, palpitations. No edema. GI: No abdominal pain. No N/V/D or constipation. No heartburn or reflux.  MUSCULOSKELETAL: No joint pain, swelling or stiffness. Back pain present with any activity. . Gait is steady w/ walker. No recent falls.  NEUROLOGIC: No dizziness, fainting, headache. No change in mental status.  PSYCHIATRIC: No anxiety, depression, behavior issue  PHYSICAL EXAM  Filed Vitals:   11/12/12 1541  BP: 122/62  Pulse: 68   Filed Weights   11/12/12 1541  Weight: 172 lb (78.019 kg)  Body mass index is 29.51 kg/(m^2).  GENERAL APPEARANCE: No acute distress, appropriately groomed, normal body habitus. Alert, pleasant, conversant. SKIN: No rash, unusual lesion, wounds HEAD: Normocephalic, atraumatic EYES: Conjunctiva/lids clear.  RESPIRATORY: Breathing is even, unlabored. Lung sounds are clear and full.  CARDIOVASCULAR: Heart RRR. Grade 3/4 holosystolic murmur (not new)    EDEMA: No peripheral or periorbital edema.   MUSCULOSKELETAL.  Tenderness across the entire lumbosacral region, also tender  at the posterior iliac crests bilaterally. Gait is steady with walker.  PSYCHIATRIC: Mood and affect appropriate to situation.   ASSESSMENT/PLAN  Back pain, lumbosacral Some improvement with physical therapy, continue these interventions  Long term (current) use of anticoagulants INR remains in therapeutic range, continue current warfarin dose. Will ask nurse to fax recent Coumadin flow sheet to Dr. Manson Passey for his review prior to dental surgery.  Transient ischemic attack (TIA), and cerebral infarction without residual deficits(V12.54) No recurrence of TIA symptoms, continue anticoagulation   Follow up: 3 weeks  Inaki Vantine T.Arieon Scalzo, NP-C 11/12/2012

## 2012-11-12 NOTE — Assessment & Plan Note (Signed)
Some improvement with physical therapy, continue these interventions

## 2012-12-03 ENCOUNTER — Non-Acute Institutional Stay: Payer: Medicare Other | Admitting: Geriatric Medicine

## 2012-12-03 VITALS — BP 118/72 | HR 62 | Ht 64.0 in | Wt 171.0 lb

## 2012-12-03 DIAGNOSIS — Z7901 Long term (current) use of anticoagulants: Secondary | ICD-10-CM

## 2012-12-03 DIAGNOSIS — I6523 Occlusion and stenosis of bilateral carotid arteries: Secondary | ICD-10-CM

## 2012-12-03 DIAGNOSIS — I6529 Occlusion and stenosis of unspecified carotid artery: Secondary | ICD-10-CM

## 2012-12-03 NOTE — Assessment & Plan Note (Signed)
INR remains in therapeutic range, continue current warfarin dose, next INR in 3 weeks

## 2012-12-03 NOTE — Assessment & Plan Note (Signed)
Asymptomatic, continue anticoagulation 

## 2012-12-03 NOTE — Progress Notes (Signed)
Patient ID: [name], female   DOB: [birth year], 79 y.o.   MRN: [id number] [facility]   Code Status: DNR     Contact Information   Name Relation Home Work Mobile   [name] Friend [phone]     [name] [phone]        Chief Complaint  Patient presents with  . Medical Managment of Chronic Issues    coag check     HPI  This 79 year old female resident of [name], [place] section,  returns to clinic today for coagulation management. Patient has been taking warfarin as directed, no new medications or dietary changes. This patient had dental work as scheduled, needs to return for further work.   Allergies  Allergies  Allergen Reactions  . Cortisone Other (See Comments)    HAS taken prednisone in past without problem  . Minocin [Minocycline Hcl] Other (See Comments)    Unknown  . Minocycline Hcl   . Neomycin-Bacitracin Zn-Polymyx   . Neosporin [Neomycin-Bacitracin Zn-Polymyx] Other (See Comments)    Unknown  . Other Other (See Comments)    Steroidal Neuromuscular Blockers  . Tetanus Toxoids Other (See Comments)    Unknown   Medications    Medication List       This list is accurate as of: 12/03/12  3:53 PM.  Always use your most recent med list.               acetaminophen 325 MG tablet  Commonly known as:  TYLENOL  Take 650 mg by mouth every 6 (six) hours as needed. For pain. Take 2 twice daily for pain     acyclovir ointment 5 %  Commonly known as:  ZOVIRAX  Apply 1 application topically every 3 (three) hours as needed. For cold sores     amoxicillin 500 MG capsule  Commonly known as:  AMOXIL  Take 2,000 mg by mouth once. Prior to dental procedure     betamethasone valerate ointment 0.1 %  Commonly known as:  VALISONE  Apply topically 2 (two) times daily. Apply to itching areas daily     cephALEXin 500 MG capsule  Commonly known as:  KEFLEX  Take one three times daily, take 2  once a day     chlorhexidine 0.12 % solution  Commonly known as:  PERIDEX  Use as directed 15 mLs in the mouth or throat. swish 15 mls for 30 sec then spit, twice daily     CLODERM 0.1 % cream  Generic drug:  Clocortolone Pivalate  Apply 1 application topically 2 (two) times daily. Apply to lesion on left cheek     desonide 0.05 % cream  Commonly known as:  DESOWEN     donepezil 10 MG tablet  Commonly known as:  ARICEPT  Take 10 mg by mouth daily.     escitalopram 20 MG tablet  Commonly known as:  LEXAPRO  Take 20 mg by mouth daily.     estrogens (conjugated) 0.3 MG tablet  Commonly known as:  PREMARIN  Take 0.3 mg by mouth daily. Take daily for 21 days then do not take for 7 days.     fluticasone 50 MCG/ACT nasal spray  Commonly known as:  FLONASE  Place 2 sprays into the nose daily as needed. For dry nasal passages     folic acid 1 MG tablet  Commonly known as:  FOLVITE  Take 1 mg by mouth daily.     gabapentin 100 MG capsule  Commonly known as:  NEURONTIN  Take 100 mg by mouth. Take 2 caps at bedtime     HYDROcodone-acetaminophen 5-325 MG per tablet  Commonly known as:  NORCO/VICODIN  Take 1 tablet by mouth. Take one every 4 hours as needed for pain     hydrocortisone-pramoxine 2.5-1 % rectal cream  Commonly known as:  ANALPRAM-HC  Place 1 application rectally 2 (two) times daily as needed. For hemorrhoids     hydrOXYzine 25 MG tablet  Commonly known as:  ATARAX/VISTARIL  Take 2 tablets (50 mg total) by mouth at bedtime. May have additional 25 mg every 8 hours as needed for itching     ipratropium-albuterol 0.5-2.5 (3) MG/3ML Soln  Commonly known as:  DUONEB  Take 3 mLs by nebulization every 6 (six) hours as needed. For wheezing and shortness of breath     levothyroxine 88 MCG tablet  Commonly known as:  SYNTHROID, LEVOTHROID  Take 88 mcg by mouth daily before breakfast. Take one tablet daily for thyroid     olopatadine 0.1 % ophthalmic solution  Commonly  known as:  PATANOL  Place 1 drop into both eyes 2 (two) times daily as needed. For dry eyes     OVER THE COUNTER MEDICATION  Cera Ve 1.3, 6-11 apply to dry skin daily     polyethylene glycol packet  Commonly known as:  MIRALAX / GLYCOLAX  Take 17 g by mouth daily.     PRESERVISION AREDS PO  Take by mouth. Take one twice daily     sennosides-docusate sodium 8.6-50 MG tablet  Commonly known as:  SENOKOT-S  Take 1 tablet by mouth. Take 2 tabs at bedtime, take one as needed     SYSTANE 0.4-0.3 % Soln  Generic drug:  Polyethyl Glycol-Propyl Glycol  Place 1 drop into both eyes daily as needed. For dry eyes     tiotropium 18 MCG inhalation capsule  Commonly known as:  SPIRIVA  Place 18 mcg into inhaler and inhale daily.     Vitamin D 2000 UNITS Caps  Take 1 capsule by mouth daily.     warfarin 5 MG tablet  Commonly known as:  COUMADIN  Take 1 tablet (5 mg total) by mouth daily. Take one tablet daily or as directed for anticoagulation        Data Reviewed     [facility] x-ray 08/04/2012 KUB: No obstruction or ileus. Extensive atherosclerotic vascular calcifications of the abdominal aorta and branches.  09/18/2012 X-ray right hip:   Mild to moderate diffuse osteopenia. No acute fracture, malalignment or lytic destructive lesion  X-ray lumbar spine:  Moderate diffuse osteopenia. Mild to moderate osteoarthritis and degenerative spondylosis cyst seen diffusely. Anterior subluxation of L4 relative to L5. No dislocation, lytic destructive lesion or vertebral body compression deformity. Mild to moderate levoconvex curvature midlumbar spine    Lab ([facility], external):  05/01/12 WBC 8.1, Hgb 10.7, Hct 34.6, Plt 300   Sed rate 36   CRP <0.5  05/29/2012: Glucose 98, BUN 21, creatinine 1.17, sodium 142, potassium 4.7. Protein/LFTs WNL.   Cholesterol 277, triglycerides 273, HDL 38, LDL 184   TSH 2.06 08/04/2012 WBC 7.8, hemoglobin 10.6, hematocrit 33.5, platelets 268  Glucose 102, BUN  20, creatinine 1.11, sodium 136, potassium 4.1  UA negative for signs of infection 11/25/12 INR 2.2    POCT  09/24/12 INR 3.5   7/16/INR 2.8  10/29/2012 INR 3.0  8/20 INR 2.4  9/10 2014 INR 2.5     REVIEW  OF SYSTEMS  DATA OBTAINED: from patient,  GENERAL: Feels tired out. No fevers, change in activity staus, appetite or weight SKIN: itchy, dry skin EYES: Very poor eyesight (macular degeneration), is looking  Into a new magnifying machine EARS: Decreased hearing RESPIRATORY: No cough, wheezing. DOE present CARDIAC: No chest pain, palpitations. No edema. GI: No abdominal pain. No N/V/D or constipation. No heartburn or reflux.  MUSCULOSKELETAL: No joint pain, swelling or stiffness. Back pain present with any activity. . Gait is steady w/ walker. No recent falls.  NEUROLOGIC: No dizziness, fainting, headache. No change in mental status.  PSYCHIATRIC: No anxiety, depression, behavior issue  PHYSICAL EXAM  Filed Vitals:   12/03/12 1550  BP: 118/72  Pulse: 62   Filed Weights   12/03/12 1550  Weight: 171 lb (77.565 kg)  Body mass index is 29.34 kg/(m^2).  GENERAL APPEARANCE: No acute distress, appropriately groomed, normal body habitus. Alert, pleasant, conversant. SKIN: No rash, unusual lesion, wounds HEAD: Normocephalic, atraumatic EYES: Conjunctiva/lids clear.  RESPIRATORY: Breathing is even, unlabored. Lung sounds are clear and full.  CARDIOVASCULAR: Heart RRR. Grade 3/4 holosystolic murmur (not new)    EDEMA: No peripheral or periorbital edema.   MUSCULOSKELETAL.  Tenderness across the entire lumbosacral region, also tender at the posterior iliac crests bilaterally. Gait is steady with walker.  PSYCHIATRIC: Mood and affect appropriate to situation.   ASSESSMENT/PLAN  Carotid artery occlusion Asymptomatic, continue anticoagulation  Long term (current) use of anticoagulants INR remains in therapeutic range, continue current warfarin dose, next INR in 3 weeks   Follow up: 3  weeks  Jhonny Calixto T.Sherria Riemann, NP-C 12/03/2012

## 2012-12-24 ENCOUNTER — Non-Acute Institutional Stay: Payer: Medicare Other | Admitting: Geriatric Medicine

## 2012-12-24 ENCOUNTER — Encounter: Payer: Self-pay | Admitting: Geriatric Medicine

## 2012-12-24 VITALS — BP 118/80 | HR 72 | Ht 64.0 in | Wt 171.0 lb

## 2012-12-24 DIAGNOSIS — Z7901 Long term (current) use of anticoagulants: Secondary | ICD-10-CM

## 2012-12-24 DIAGNOSIS — R5381 Other malaise: Secondary | ICD-10-CM | POA: Insufficient documentation

## 2012-12-24 NOTE — Progress Notes (Signed)
Patient ID: [name], female   DOB: [birth year], 79 y.o.   MRN: [id number] [facility]   Code Status: DNR     Contact Information   Name Relation Home Work Mobile   [name],[name] [phone]     [name] [phone]        Chief Complaint  Patient presents with  . Medical Managment of Chronic Issues    coag check    HPI  This 79 year old female resident of [name], [place] section,  returns to clinic today for coagulation management. Patient has been taking warfarin as directed, no new medications or dietary changes.  Patient's only complaint is that she feels tired all the time, reports her arms and her legs feel weak all the time. No shortness of breath, no new pain. Patient wonders if vitamins would be helpful. Back pain is unchanged, continues to limit patient's activity level. PT interventions have completed   Allergies  Allergen Reactions  . Cortisone Other (See Comments)    HAS taken prednisone in past without problem  . Minocin [Minocycline Hcl] Other (See Comments)    Unknown  . Minocycline Hcl   . Neomycin-Bacitracin Zn-Polymyx   . Neosporin [Neomycin-Bacitracin Zn-Polymyx] Other (See Comments)    Unknown  . Other Other (See Comments)    Steroidal Neuromuscular Blockers  . Tetanus Toxoids Other (See Comments)    Unknown   Medications reviewed   Data Reviewed     [facility] x-ray 08/04/2012 KUB: No obstruction or ileus. Extensive atherosclerotic vascular calcifications of the abdominal aorta and branches.  09/18/2012 X-ray right hip:   Mild to moderate diffuse osteopenia. No acute fracture, malalignment or lytic destructive lesion  X-ray lumbar spine:  Moderate diffuse osteopenia. Mild to moderate osteoarthritis and degenerative spondylosis cyst seen diffusely. Anterior subluxation of L4 relative to L5. No dislocation, lytic destructive lesion or vertebral body compression deformity.  Mild to moderate levoconvex curvature midlumbar spine  Laboratory Studies  [facility]  05/01/12 WBC 8.1, Hgb 10.7, Hct 34.6, Plt 300   Sed rate 36   CRP <0.5  05/29/2012: Glucose 98, BUN 21, creatinine 1.17, sodium 142, potassium 4.7. Protein/LFTs WNL.   Cholesterol 277, triglycerides 273, HDL 38, LDL 184   TSH 2.06 08/04/2012 WBC 7.8, hemoglobin 10.6, hematocrit 33.5, platelets 268  Glucose 102, BUN 20, creatinine 1.11, sodium 136, potassium 4.1  UA negative for signs of infection 11/25/12 INR 2.2    POCT ([name])  10/29/2012 INR 3.0  8/20 INR 2.4  9/10 2014 INR 2.5  12/24/2012 INR 2.4     REVIEW OF SYSTEMS  DATA OBTAINED: from patient,  GENERAL: Feels tired out. No fevers, change in activity staus, appetite or weight SKIN: itchy, dry skin EYES: Very poor eyesight (macular degeneration), is looking  Into a new magnifying machine EARS: Decreased hearing RESPIRATORY: No cough, wheezing, SOB Using supplemental O2 just at night CARDIAC: No chest pain, palpitations. No edema. GI: No abdominal pain. No N/V/D or constipation. No heartburn or reflux.  MUSCULOSKELETAL: No joint pain, swelling or stiffness. Back pain present with any activity. . Gait is steady w/ walker. No recent falls.  NEUROLOGIC: No dizziness, fainting, headache. No change in mental status.  PSYCHIATRIC: No anxiety, depression, behavior issue  PHYSICAL EXAM  Filed Vitals:   12/24/12 1601  BP: 118/80  Pulse: 72   Filed Weights   12/24/12 1601  Weight: 171 lb (77.565 kg)  Body mass index is 29.34 kg/(m^2).  GENERAL APPEARANCE: No acute  distress, appropriately groomed, normal body habitus. Alert, pleasant, conversant. SKIN: No rash, unusual lesion, wounds HEAD: Normocephalic, atraumatic EYES: Conjunctiva/lids clear.  RESPIRATORY: Breathing is even, unlabored. Lung sounds are clear and full.  CARDIOVASCULAR: Heart RRR. Grade 3/4 holosystolic murmur (not new)    EDEMA: No peripheral or periorbital  edema.   MUSCULOSKELETAL.  Tenderness across the entire lumbosacral region, also tender at the posterior iliac crests bilaterally. Gait is steady with walker.  PSYCHIATRIC: Mood and affect appropriate to situation.   ASSESSMENT/PLAN  Other malaise and fatigue Feels tired all the time, h/o anemia, rectal bleeding, hypothyroidism. Check lab  Long term (current) use of anticoagulants INR remains in therapeutic range, continue current warfarin dose. Next INR in 4 weeks   Follow up: 4 weeks INR  Lab 10/2 CBC, TSH, B12  Zuria Fosdick T.Barbette Mcglaun, NP-C 12/24/2012

## 2012-12-24 NOTE — Assessment & Plan Note (Signed)
INR remains in therapeutic range, continue current warfarin dose. Next INR in 4 weeks 

## 2012-12-24 NOTE — Assessment & Plan Note (Signed)
Feels tired all the time, h/o anemia, rectal bleeding, hypothyroidism. Check lab

## 2013-01-20 ENCOUNTER — Ambulatory Visit: Payer: Medicare Other | Admitting: Gastroenterology

## 2013-01-26 ENCOUNTER — Encounter: Payer: Self-pay | Admitting: Internal Medicine

## 2013-01-26 ENCOUNTER — Non-Acute Institutional Stay: Payer: Medicare Other | Admitting: Internal Medicine

## 2013-01-26 VITALS — BP 160/82 | HR 69 | Ht 64.0 in | Wt 173.0 lb

## 2013-01-26 DIAGNOSIS — J449 Chronic obstructive pulmonary disease, unspecified: Secondary | ICD-10-CM

## 2013-01-26 DIAGNOSIS — R0602 Shortness of breath: Secondary | ICD-10-CM

## 2013-01-26 DIAGNOSIS — I509 Heart failure, unspecified: Secondary | ICD-10-CM

## 2013-01-28 ENCOUNTER — Non-Acute Institutional Stay: Payer: Medicare Other | Admitting: Geriatric Medicine

## 2013-01-28 ENCOUNTER — Encounter: Payer: Self-pay | Admitting: Geriatric Medicine

## 2013-01-28 VITALS — BP 142/86 | HR 78 | Wt 173.0 lb

## 2013-01-28 DIAGNOSIS — Z7901 Long term (current) use of anticoagulants: Secondary | ICD-10-CM

## 2013-01-28 DIAGNOSIS — R0602 Shortness of breath: Secondary | ICD-10-CM

## 2013-01-28 DIAGNOSIS — R5381 Other malaise: Secondary | ICD-10-CM

## 2013-01-28 NOTE — Progress Notes (Signed)
Patient ID: [name], female   DOB: 1919/05/01, 79 y.o.   MRN: [id number] [facility]   Code Status: DNR Contact Information   Name Relation Home Work Mobile   [name],[name] [phone]     [name] [phone]        Chief Complaint  Patient presents with  . Medical Managment of Chronic Issues    coag checkl Breathing not any better    HPI  This 79 year old female resident of [name], [place] section,  returns to clinic today for coagulation management. Patient has been taking warfarin as directed.  Patient was evaluated urgently on Monday, November 3 by Dr. Chilton Si for complaints of shortness of breath and worsening edema. She was started on daily dose of diuretic. There's been no change in shortness of breath, pedal edema or weight. Patient continues to complain of feeling very fatigued, can hardly walk across a room without getting short of breath. Also report she breaks out in cold sweats at night and with any exertion. 12/25/2012 Lab showed persistent anemia, normal TSH and B12.  Patient has chronic anemia due to intermittent bleeding hemorrhoids, has been worked up by GI. She is intolerant of iron supplementation    Allergies  Allergen Reactions  . Cortisone Other (See Comments)    HAS taken prednisone in past without problem  . Minocin [Minocycline Hcl] Other (See Comments)    Unknown  . Minocycline Hcl   . Neomycin-Bacitracin Zn-Polymyx   . Neosporin [Neomycin-Bacitracin Zn-Polymyx] Other (See Comments)    Unknown  . Other Other (See Comments)    Steroidal Neuromuscular Blockers  . Tetanus Toxoids Other (See Comments)    Unknown   Medications reviewed   Data Reviewed     [facility] mobile x-ray 08/04/2012 KUB: No obstruction or ileus. Extensive atherosclerotic vascular calcifications of the abdominal aorta and branches.  09/18/2012 X-ray right hip:   Mild to moderate diffuse osteopenia. No acute  fracture, malalignment or lytic destructive lesion  X-ray lumbar spine:  Moderate diffuse osteopenia. Mild to moderate osteoarthritis and degenerative spondylosis cyst seen diffusely. Anterior subluxation of L4 relative to L5. No dislocation, lytic destructive lesion or vertebral body compression deformity. Mild to moderate levoconvex curvature midlumbar spine  Laboratory Studies  [facility]  05/01/12 WBC 8.1, Hgb 10.7, Hct 34.6, Plt 300   Sed rate 36   CRP <0.5  05/29/2012: Glucose 98, BUN 21, creatinine 1.17, sodium 142, potassium 4.7. Protein/LFTs WNL.   Cholesterol 277, triglycerides 273, HDL 38, LDL 184   TSH 2.06 08/04/2012 WBC 7.8, hemoglobin 10.6, hematocrit 33.5, platelets 268  Glucose 102, BUN 20, creatinine 1.11, sodium 136, potassium 4.1  UA negative for signs of infection 11/25/12 INR 2.2  12/25/2012 WBC 6.8, hemoglobin 9.2, hematocrit 29.4, platelets 287  TSH 1.22  B12 584     POCT ([name])  9/10 2014 INR 2.5  12/24/2012 INR 2.4  01/28/2013 INR 2.5     REVIEW OF SYSTEMS  DATA OBTAINED: from patient,  GENERAL: Feels tired out. No fevers, decreased activity staus, appetite. No weight change SKIN: itchy, dry skin EYES: Very poor eyesight (macular degeneration), is looking  Into a new magnifying machine EARS: Decreased hearing RESPIRATORY: No cough, wheezing, Very SOB with any activity, Using supplemental O2 while in apartment CARDIAC: No chest pain, palpitations. Bilateral LE edema GI: No abdominal pain. No N/V/D or constipation. No heartburn or reflux.  MUSCULOSKELETAL: No joint pain, swelling or stiffness. Back pain present with any activity. . Gait  is steady w/ walker. No recent falls.  NEUROLOGIC: No dizziness, fainting, headache. No change in mental status.  PSYCHIATRIC: Anxious No anxiety, depression, behavior issue  PHYSICAL EXAM   Filed Weights   01/28/13 1603  Weight: 173 lb (78.472 kg)  Body mass index is 29.68 kg/(m^2).  GENERAL  APPEARANCE: No acute distress, appropriately groomed, normal body habitus. Alert, pleasant, conversant. SKIN: No rash, unusual lesion, wounds HEAD: Normocephalic, atraumatic EYES: Conjunctiva/lids clear.  RESPIRATORY: Breathing is even, unlabored. Lung sounds are clear and full.  CARDIOVASCULAR: Heart RRR. Grade 3/4 holosystolic murmur (not new)    EDEMA: 1+ bilateral pedal /ankle edema.   MUSCULOSKELETAL.  Tenderness across the entire lumbosacral region, also tender at the posterior iliac crests bilaterally. Gait is unsteady    PSYCHIATRIC: Mood is Anxious   ASSESSMENT/PLAN  Shortness of breath Worsening shortness of breath over the last week, new bipedal edema. Differential includes worsening anemia, heart failure re: AoS, COPD. have recommended chest x-ray, patient prefers to have this performed in pulmonology office tomorrow. Will check labs in the a.m. to include  BNP, CBC and BMP.  Other malaise and fatigue Symptoms are worsening with onset of shortness of breath and edema.  Concern for worsening anemia, repeat CBC.  Long term (current) use of anticoagulants INR is in therapeutic range, continue current warfarin dose, next INR 4 weeks   Follow up: 4 weeks INR  Lab 11/6: BNP, CBC, BMP, Fe, IBC,ferritin  Neil Brickell T.Violett Hobbs, NP-C 01/28/2013

## 2013-01-29 ENCOUNTER — Encounter: Payer: Self-pay | Admitting: Pulmonary Disease

## 2013-01-29 ENCOUNTER — Ambulatory Visit (INDEPENDENT_AMBULATORY_CARE_PROVIDER_SITE_OTHER): Payer: Medicare Other | Admitting: Pulmonary Disease

## 2013-01-29 ENCOUNTER — Ambulatory Visit (INDEPENDENT_AMBULATORY_CARE_PROVIDER_SITE_OTHER)
Admission: RE | Admit: 2013-01-29 | Discharge: 2013-01-29 | Disposition: A | Discharge status: Home / Self Care | Payer: Medicare Other | Source: Ambulatory Visit | Attending: Pulmonary Disease | Admitting: Pulmonary Disease

## 2013-01-29 VITALS — BP 142/74 | HR 72 | Ht 64.0 in | Wt 168.0 lb

## 2013-01-29 DIAGNOSIS — R0602 Shortness of breath: Secondary | ICD-10-CM

## 2013-01-29 DIAGNOSIS — J9610 Chronic respiratory failure, unspecified whether with hypoxia or hypercapnia: Secondary | ICD-10-CM

## 2013-01-29 DIAGNOSIS — J449 Chronic obstructive pulmonary disease, unspecified: Secondary | ICD-10-CM

## 2013-01-29 DIAGNOSIS — I509 Heart failure, unspecified: Secondary | ICD-10-CM

## 2013-01-29 MED ORDER — FUROSEMIDE 40 MG PO TABS: 60.0000 mg | ORAL_TABLET | Freq: Two times a day (BID) | ORAL | Status: DC

## 2013-01-29 MED ORDER — POTASSIUM CHLORIDE ER 10 MEQ PO TBCR: 20.0000 meq | EXTENDED_RELEASE_TABLET | Freq: Every day | ORAL | Status: DC

## 2013-01-29 NOTE — Assessment & Plan Note (Signed)
Symptoms are worsening with onset of shortness of breath and edema.  Concern for worsening anemia, repeat CBC.

## 2013-01-29 NOTE — Progress Notes (Signed)
Subjective:    Patient ID: [name], female    DOB: 1920/03/01, 79 y.o.   MRN: [id number]  Synopsis: 79 y/o female with COPD who is followed by Dr. Craige Cotta.  PFTs in 2009 showed clear obstruction, FEV1 1.70 L (104% pred), TLC 5.21 L (111% pred), RV 2.41L (117% pred), DLCO 8.0, 41% pred  HPI  01/29/2013 acute visit >> There is a question of fluid around her heart or lungs.  She is short of breath.  She has been receiving treatment from Dr. Craige Cotta for COPD> spiriva daily and oxygen at night.  She has sweling in her legs and her face.  She has been told by her physicians at [facility] that she had swelling in and around hear heart.  She has been feeling more short of breath for several weeks.  Her back has been bothering her lately.  A diuretic (lasix) was started on Monday and some vicodin was also started at that time.  No phlegm production.    Past Medical History  Diagnosis Date  . Cholelithiases   . CHF (congestive heart failure)   . Hypothyroid   . CAD (coronary artery disease)   . Leg paresthesia   . Aortic valve stenosis   . Arthritis   . Hyperlipidemia   . Dementia   . Hypoxemia   . Carotid artery occlusion   . Renal disorder   . Hypertension   . COPD (chronic obstructive pulmonary disease)   . Hyperlipemia   . SEIZURE DISORDER 11/05/2006  . GERD 11/05/2006  . CVA 02/07/2007  . CONGESTIVE HEART FAILURE 09/13/2006  . HYPOTHYROIDISM 09/13/2006  . Macular degeneration of both eyes   . Blind in both eyes   . Peripheral neuropathy   . Heart murmur     "a bad one!!!!!!"  . Pneumonia     "many times"  . History of bronchitis     "get it q once in awhile"  . Exertional dyspnea   . Internal hemorrhoid, bleeding   . Malaria     "as a child"  . Skin cancer     "nose, face mostly"  . UTI (lower urinary tract infection) 10/15/11  . Chronic lower back pain     "when I go to the bathroom"  . Intestinal infection due to Clostridium difficile 09/2011  . Benign neoplasm of  breast   . Gout, unspecified   . Anemia   . Anxiety   . Other dysfunctions of sleep stages or arousal from sleep   . Depression   . Mitral valve disorders   . Unspecified transient cerebral ischemia   . Acute, but ill-defined, cerebrovascular disease   . Peripheral vascular disease, unspecified   . Chronic rhinitis   . Abscess of lung(513.0)     RUL  . Esophageal reflux   . Diaphragmatic hernia without mention of obstruction or gangrene   . Diverticulosis of colon (without mention of hemorrhage)   . Acute duodenal ulcer with perforation and obstruction 08/2011    perforation  . Unspecified constipation   . Hemorrhage of rectum and anus   . Abscess of intestine 09/2011    drainage  . Other psoriasis   . Unspecified pruritic disorder   . Other specified disease of sebaceous glands   . Lumbago   . Dizziness and giddiness     vertigo  . Disturbance of skin sensation   . Edema   . Shortness of breath   . Dysphagia, oropharyngeal phase   . Hepatomegaly   .  Nonspecific elevation of levels of transaminase or lactic acid dehydrogenase (LDH)   . Solitary pulmonary nodule     RLL nodule 1 cm  . Debility, unspecified 08/2011  . Transient ischemic attack (TIA), and cerebral infarction without residual deficits(V12.54)   . Long term (current) use of anticoagulants   . Personal history of urinary (tract) infection   . Sacroiliitis 08/27/2012  . Pruritus 10/08/2012     Review of Systems  Constitutional: Negative for fever and chills.  HENT: Negative for postnasal drip, rhinorrhea and sinus pressure.   Respiratory: Positive for cough, shortness of breath and wheezing.   Cardiovascular: Positive for leg swelling. Negative for chest pain and palpitations.       Objective:   Physical Exam  Filed Vitals:   01/29/13 1504  BP: 142/74  Pulse: 72  Height: 5\' 4"  (1.626 m)  Weight: 168 lb (76.204 kg)  SpO2: 89%    Gen: chronically ill, in wheelchair, no acute distress HEENT: NCAT,  EOMi PULM: Crackles in bases bilaterally, no wheezing CV: RRR, systolic murmur noted, JVD elevated AB: BS+, soft, nontender Ext: pitting pretibial edema noted, warm, acyanotic Neuro: A&Ox4, maew     Assessment & Plan:   CONGESTIVE HEART FAILURE A 2006 echocardiogram showed a normal LVEF.  Today on exam she is clearly volume overloaded which is likely due to CHF.    40mg  daily lasix has been ineffective  Plan: -See [facility] ASAP -We will get a Chest X-ray to evaluate this further -Increase lasix to 60mg  po bid until symptoms improve or until she sees [place] [facility] -Add KCl daily -check BMET in 4 days  Chronic respiratory failure Given the heart failure I will ask that she start using 2 L continuously for now until she follows up with [place] again  COPD Her dyspnea is not worse due to this.  Her exam is consistent with CHF.  Plan: -continue spiriva   Updated Medication List Outpatient Encounter Prescriptions as of 01/29/2013  Medication Sig  . acetaminophen (TYLENOL) 325 MG tablet Take 650 mg by mouth every 6 (six) hours as needed. For pain. Take 2 twice daily for pain  . acyclovir ointment (ZOVIRAX) 5 % Apply 1 application topically every 3 (three) hours as needed. For cold sores  . amoxicillin (AMOXIL) 500 MG capsule Take 2,000 mg by mouth once. Prior to dental procedure  . betamethasone valerate ointment (VALISONE) 0.1 % Apply topically 2 (two) times daily. Apply to itching areas daily  . chlorhexidine (PERIDEX) 0.12 % solution Use as directed 15 mLs in the mouth or throat. swish 15 mls for 30 sec then spit, twice daily  . Cholecalciferol (VITAMIN D) 2000 UNITS CAPS Take 1 capsule by mouth daily.  Marland Kitchen Clocortolone Pivalate (CLODERM) 0.1 % cream Apply 1 application topically 2 (two) times daily. Apply to lesion on left cheek  . desonide (DESOWEN) 0.05 % cream Apply to healing areas on face as needed  . donepezil (ARICEPT) 10 MG tablet Take 10 mg by  mouth daily.  Marland Kitchen escitalopram (LEXAPRO) 20 MG tablet Take 20 mg by mouth daily.  Marland Kitchen estrogens, conjugated, (PREMARIN) 0.3 MG tablet Take 0.3 mg by mouth daily. Take one daily  . fluticasone (FLONASE) 50 MCG/ACT nasal spray Place 2 sprays into the nose daily as needed. For dry nasal passages  . folic acid (FOLVITE) 1 MG tablet Take 1 mg by mouth daily.  . furosemide (LASIX) 40 MG tablet Take 1.5 tablets (60 mg total) by mouth 2 (  two) times daily at 10 am and 4 pm. Take one tablet in morning for edema  . gabapentin (NEURONTIN) 100 MG capsule Take 100 mg by mouth. Take 2 caps at bedtime  . HYDROcodone-acetaminophen (NORCO/VICODIN) 5-325 MG per tablet Take 1 tablet by mouth. Take one every 4 hours as needed for pain  . hydrocortisone-pramoxine (ANALPRAM-HC) 2.5-1 % rectal cream Place 1 application rectally 2 (two) times daily as needed. For hemorrhoids  . hydrOXYzine (ATARAX/VISTARIL) 25 MG tablet Take 2 tablets (50 mg total) by mouth at bedtime. May have additional 25 mg every 8 hours as needed for itching  . ipratropium-albuterol (DUONEB) 0.5-2.5 (3) MG/3ML SOLN Take 3 mLs by nebulization every 6 (six) hours as needed. For wheezing and shortness of breath  . levothyroxine (SYNTHROID, LEVOTHROID) 88 MCG tablet Take 88 mcg by mouth daily before breakfast. Take one tablet daily for thyroid  . Multiple Vitamins-Minerals (PRESERVISION AREDS PO) Take by mouth. Take one twice daily  . olopatadine (PATANOL) 0.1 % ophthalmic solution Place 1 drop into both eyes 2 (two) times daily as needed. For dry eyes  . OVER THE COUNTER MEDICATION Cera Ve 1.3, 6-11 apply to dry skin daily  . Polyethyl Glycol-Propyl Glycol (SYSTANE) 0.4-0.3 % SOLN Place 1 drop into both eyes daily as needed. For dry eyes  . polyethylene glycol (MIRALAX / GLYCOLAX) packet Take 17 g by mouth daily.  . sennosides-docusate sodium (SENOKOT-S) 8.6-50 MG tablet Take 1 tablet by mouth. Take 2 tabs at bedtime, take one as needed  . tiotropium  (SPIRIVA) 18 MCG inhalation capsule Place 18 mcg into inhaler and inhale daily.  Marland Kitchen warfarin (COUMADIN) 5 MG tablet Take 1 tablet (5 mg total) by mouth daily. Take one tablet daily or as directed for anticoagulation  . [DISCONTINUED] furosemide (LASIX) 40 MG tablet Take one tablet in morning for edema  . potassium chloride (K-DUR) 10 MEQ tablet Take 2 tablets (20 mEq total) by mouth daily.

## 2013-01-29 NOTE — Assessment & Plan Note (Addendum)
Worsening shortness of breath over the last week, new bipedal edema. Differential includes worsening anemia, heart failure re: AoS, COPD. have recommended chest x-ray, patient prefers to have this performed in pulmonology office tomorrow. Will check labs in the a.m. to include  BNP, CBC and BMP.

## 2013-01-29 NOTE — Addendum Note (Signed)
Addended by: Velvet Bathe on: 01/29/2013 04:38 PM   Modules accepted: Orders

## 2013-01-29 NOTE — Progress Notes (Signed)
Subjective:    Patient ID: [name], female    DOB: [birth year], 79 y.o.   MRN: [id number]  Chief Complaint  Patient presents with  . Shortness of Breath    and edema worse past couple of weeks    HPI Increasing dyspnea and ankle edema for at least 2 weeks. She is gaining weight. Abdomen feels tight.  She has a history of significant COPD. She also has known congestive heart failure. There has been no fever, chills, sputum, or hemoptysis.  She is not currently on a diuretic.  Current Outpatient Prescriptions on File Prior to Visit  Medication Sig Dispense Refill  . acetaminophen (TYLENOL) 325 MG tablet Take 650 mg by mouth every 6 (six) hours as needed. For pain. Take 2 twice daily for pain      . acyclovir ointment (ZOVIRAX) 5 % Apply 1 application topically every 3 (three) hours as needed. For cold sores      . amoxicillin (AMOXIL) 500 MG capsule Take 2,000 mg by mouth once. Prior to dental procedure      . betamethasone valerate ointment (VALISONE) 0.1 % Apply topically 2 (two) times daily. Apply to itching areas daily      . chlorhexidine (PERIDEX) 0.12 % solution Use as directed 15 mLs in the mouth or throat. swish 15 mls for 30 sec then spit, twice daily      . Cholecalciferol (VITAMIN D) 2000 UNITS CAPS Take 1 capsule by mouth daily.      Marland Kitchen Clocortolone Pivalate (CLODERM) 0.1 % cream Apply 1 application topically 2 (two) times daily. Apply to lesion on left cheek      . desonide (DESOWEN) 0.05 % cream Apply to healing areas on face as needed      . donepezil (ARICEPT) 10 MG tablet Take 10 mg by mouth daily.      Marland Kitchen escitalopram (LEXAPRO) 20 MG tablet Take 20 mg by mouth daily.      Marland Kitchen estrogens, conjugated, (PREMARIN) 0.3 MG tablet Take 0.3 mg by mouth daily. Take one daily      . fluticasone (FLONASE) 50 MCG/ACT nasal spray Place 2 sprays into the nose daily as needed. For dry nasal passages      . folic acid (FOLVITE) 1 MG tablet Take 1 mg by mouth daily.      Marland Kitchen  gabapentin (NEURONTIN) 100 MG capsule Take 100 mg by mouth. Take 2 caps at bedtime      . HYDROcodone-acetaminophen (NORCO/VICODIN) 5-325 MG per tablet Take 1 tablet by mouth. Take one every 4 hours as needed for pain      . hydrocortisone-pramoxine (ANALPRAM-HC) 2.5-1 % rectal cream Place 1 application rectally 2 (two) times daily as needed. For hemorrhoids      . hydrOXYzine (ATARAX/VISTARIL) 25 MG tablet Take 2 tablets (50 mg total) by mouth at bedtime. May have additional 25 mg every 8 hours as needed for itching      . ipratropium-albuterol (DUONEB) 0.5-2.5 (3) MG/3ML SOLN Take 3 mLs by nebulization every 6 (six) hours as needed. For wheezing and shortness of breath      . levothyroxine (SYNTHROID, LEVOTHROID) 88 MCG tablet Take 88 mcg by mouth daily before breakfast. Take one tablet daily for thyroid      . Multiple Vitamins-Minerals (PRESERVISION AREDS PO) Take by mouth. Take one twice daily      . olopatadine (PATANOL) 0.1 % ophthalmic solution Place 1 drop into both eyes 2 (two) times daily as needed. For dry eyes      .  OVER THE COUNTER MEDICATION Cera Ve 1.3, 6-11 apply to dry skin daily      . Polyethyl Glycol-Propyl Glycol (SYSTANE) 0.4-0.3 % SOLN Place 1 drop into both eyes daily as needed. For dry eyes      . polyethylene glycol (MIRALAX / GLYCOLAX) packet Take 17 g by mouth daily.      . sennosides-docusate sodium (SENOKOT-S) 8.6-50 MG tablet Take 1 tablet by mouth. Take 2 tabs at bedtime, take one as needed      . tiotropium (SPIRIVA) 18 MCG inhalation capsule Place 18 mcg into inhaler and inhale daily.      Marland Kitchen warfarin (COUMADIN) 5 MG tablet Take 1 tablet (5 mg total) by mouth daily. Take one tablet daily or as directed for anticoagulation       No current facility-administered medications on file prior to visit.    Review of Systems  Constitutional: Positive for activity change, fatigue and unexpected weight change (gaining weight). Negative for fever and diaphoresis.  HENT:  Positive for hearing loss. Negative for dental problem, drooling, ear pain, mouth sores, nosebleeds and sore throat.   Eyes: Positive for visual disturbance (corrective lenses).  Respiratory: Positive for cough and shortness of breath. Negative for apnea, choking, chest tightness, wheezing and stridor.        Using O2 constantly. DOE.  Cardiovascular: Positive for leg swelling. Negative for chest pain and palpitations.  Gastrointestinal: Negative for nausea, vomiting, abdominal pain, diarrhea and abdominal distention.  Genitourinary: Negative.   Musculoskeletal: Positive for arthralgias, back pain and gait problem.  Skin: Negative.   Neurological: Negative.   Hematological: Negative.   Psychiatric/Behavioral: The patient is nervous/anxious.        Objective:   Physical Exam  Constitutional: She is oriented to person, place, and time. She appears well-developed and well-nourished. She appears distressed.  HENT:  Head: Normocephalic and atraumatic.  Right Ear: External ear normal.  Nose: Nose normal.  Mouth/Throat: Oropharynx is clear and moist.  Hearing loss  Eyes: Conjunctivae and EOM are normal. Pupils are equal, round, and reactive to light.  Neck: No JVD present. No tracheal deviation present. No thyromegaly present.  Cardiovascular: Regular rhythm.  Exam reveals no gallop and no friction rub.   Murmur (3/6 holosystolic murmur at LSB) heard. Pulmonary/Chest: She is in respiratory distress. She has no wheezes. She has rales. She exhibits no tenderness.  Abdominal: She exhibits no distension and no mass. There is no tenderness.  Musculoskeletal: She exhibits edema (2+ bipedal) and tenderness (Across LS area).  Lymphadenopathy:    She has no cervical adenopathy.  Neurological: She is alert and oriented to person, place, and time. She has normal reflexes. No cranial nerve deficit. Coordination normal.  Skin: No rash noted. No erythema. No pallor.  Psychiatric:  anxious           Assessment & Plan:  CONGESTIVE HEART FAILURE: Increased rales, pedal edema, and shortness of breath trauma suggestive of worsening of her congestive heart failure. There does not seem to be a precipitating event for this.  Plan: Start furosemide 20 mg twice daily. She has an appointment with my nurse practitioner in 2 days. Hopefully she will be breathing a little easier by that tim.  COPD: Breathing difficulties have chronic nature are related to her COPD the combination of this issue with worsening congestive heart failure is making it very difficult for her to breathe. The correctable factors seem to center around diuresing her.  Shortness of breath: Multifactorial issue with acute  worsening related to worsening of her congestive heart failure. COPD is also present.

## 2013-01-29 NOTE — Patient Instructions (Addendum)
Use lasix twice a day at 8AM and 2PM until you feel better or until you see Dr. Hazle Coca office Take the potassium daily while you are taking the lasix Use 2 liters of oxygen continuously  Follow up with Dr. Craige Cotta in 2-4 weeks

## 2013-01-29 NOTE — Assessment & Plan Note (Signed)
A 2006 echocardiogram showed a normal LVEF.  Today on exam she is clearly volume overloaded which is likely due to CHF.    40mg  daily lasix has been ineffective  Plan: -See [facility] ASAP -We will get a Chest X-ray to evaluate this further -Increase lasix to 60mg  po bid until symptoms improve or until she sees [place] [facility] -Add KCl daily -check BMET in 4 days

## 2013-01-29 NOTE — Assessment & Plan Note (Signed)
Her dyspnea is not worse due to this.  Her exam is consistent with CHF.  Plan: -continue spiriva

## 2013-01-29 NOTE — Assessment & Plan Note (Signed)
INR is in therapeutic range, continue current warfarin dose, next INR 4 weeks

## 2013-01-29 NOTE — Assessment & Plan Note (Signed)
Given the heart failure I will ask that she start using 2 L continuously for now until she follows up with [place] again

## 2013-01-30 ENCOUNTER — Non-Acute Institutional Stay: Payer: Medicare Other | Admitting: Geriatric Medicine

## 2013-01-30 ENCOUNTER — Encounter: Payer: Self-pay | Admitting: Geriatric Medicine

## 2013-01-30 ENCOUNTER — Telehealth: Payer: Self-pay | Admitting: Pulmonary Disease

## 2013-01-30 ENCOUNTER — Other Ambulatory Visit: Payer: Self-pay | Admitting: Geriatric Medicine

## 2013-01-30 DIAGNOSIS — I509 Heart failure, unspecified: Secondary | ICD-10-CM

## 2013-01-30 DIAGNOSIS — D509 Iron deficiency anemia, unspecified: Secondary | ICD-10-CM

## 2013-01-30 MED ORDER — FUROSEMIDE 40 MG PO TABS: 60.0000 mg | ORAL_TABLET | Freq: Two times a day (BID) | ORAL | Status: DC

## 2013-01-30 NOTE — Assessment & Plan Note (Signed)
Symptoms significantly improved today; less SOB, no LE edema. Continue BID Lasix today, start QD dosing tomorrow, BMP scheduled 02/02/13. Cardiology appointment is scheduled 11/10.

## 2013-01-30 NOTE — Telephone Encounter (Signed)
Referral faxed back to [facility] with o2 sats from [facility]. Spoke with Synetta Fail at [place] and she stated that was all that she needed to process this order. o2 sat (vitals) were scanned into Epic. Nothing else needed at this time. [name]

## 2013-01-30 NOTE — Telephone Encounter (Signed)
I spoke with Marchelle Folks from [place]. She reports they received an order for O2 24/7. Pt already has O2 through them for night. They have no qualifying sats for pt to qualify her for daytime O2. In order for pt to get this they needs sat qualifications. I spoke with Bjorn Loser and she is going to try and help and see what can be done. Pt has a RA sat documented of 89%. In pt OV note it does not look like she was walked and it stated she was in a wheelchair. Will await rhonda response.

## 2013-01-30 NOTE — Assessment & Plan Note (Signed)
CBC with significant decrease in H/H, Fe level very low as well. Recommend transfusion, patient does not want to go to hospital. Her SOB has improved with treatment of CHF, continues to be very fatigued, BP/P are stable. Have arranged out-pt transfusion for 11/13.2014 (first available) with patient understanding hospital admission will be necessary if her condition worsens.

## 2013-01-30 NOTE — Progress Notes (Signed)
Patient ID: [name], female   DOB: 1919/06/21, 79 y.o.   MRN: [id number] [facility] [phone])  Code Status: DNR Contact Information   Name Relation Home Work Mobile   [name],[name] [phone]     [name] [phone]        Chief Complaint  Patient presents with  . Anemia  . Congestive Heart Failure    HPI: This is a 79 y.o. female resident of [name], Assisted Living section evaluated today in follow up of SOB, anemia.  Last visit:  Shortness of breath Worsening shortness of breath over the last week, new bipedal edema. Differential includes worsening anemia, heart failure re: AoS, COPD. have recommended chest x-ray, patient prefers to have this performed in pulmonology office tomorrow. Will check labs in the a.m. to include  BNP, CBC and BMP.  Other malaise and fatigue Symptoms are worsening with onset of shortness of breath and edema.  Concern for worsening anemia, repeat CBC.  Long term (current) use of anticoagulants INR is in therapeutic range, continue current warfarin dose, next INR 4 weeks  Today; Repeat CBC shows persistent Fe deficient  Anemia with significant drop in H/H. Recommend transfusion. Patient does not want hospital admission if at all possible.   Lab returned late yesterday afternnoon with elevated BNP confirming suspicion of CHF. Patien was evaluated earlier in the day by Dr.McQuaid who increased diuretic dose, Added KCl, arranged cardiology appointment 02/02/13.     Allergies  Allergen Reactions  . Cortisone Other (See Comments)    HAS taken prednisone in past without problem  . Minocin [Minocycline Hcl] Other (See Comments)    Unknown  . Minocycline Hcl   . Neomycin-Bacitracin Zn-Polymyx   . Neosporin [Neomycin-Bacitracin Zn-Polymyx] Other (See Comments)    Unknown  . Other Other (See Comments)    Steroidal Neuromuscular Blockers  . Tetanus Toxoids Other (See Comments)     Unknown   Medications Reviewed  DATA REVIEWED  Radiologic Exams:   Cardiovascular Exams:   Laboratory Studies  [facility]             May 15, 2012 WBC 8.1, Hgb 10.7, Hct 34.6, Plt 300                         Sed rate 36                         CRP <0.5             05/29/2012: Glucose 98, BUN 21, creatinine 1.17, sodium 142, potassium 4.7. Protein/LFTs WNL.                         Cholesterol 277, triglycerides 273, HDL 38, LDL 184                         TSH 2.06 08/04/2012 WBC 7.8, hemoglobin 10.6, hematocrit 33.5, platelets 268             Glucose 102, BUN 20, creatinine 1.11, sodium 136, potassium 4.1             UA negative for signs of infection 11/25/12 INR 2.2  12/25/2012 WBC 6.8, hemoglobin 9.2, hematocrit 29.4, platelets 287             TSH 1.22             B12 584  01/29/2013 WBC 7.3, Hgb 7.7, Hct 25.3, MCV 67.6, PLt 314  Fe 13,  Ferritin 8  BNP 853.4    POCT ([name])             9/10 2014 INR 2.5             12/24/2012 INR 2.4             01/28/2013 INR 2.5      Review of Systems  DATA OBTAINED: from patient,  GENERAL: Feels tired out, was up to BR frequently overnight, had leg cramps overnight  No fevers, decreased activity status, appetite. No weight change SKIN: itchy, dry skin EYES: Very poor eyesight (macular degeneration), is looking  Into a new magnifying machine EARS: Decreased hearing RESPIRATORY: No cough, wheezing, Less SOB today, Using supplemental O2 AAT CARDIAC: No chest pain, palpitations. No Bilateral LE edema  today GI: No abdominal pain. No N/V/D or constipation. No heartburn or reflux.   MUSCULOSKELETAL: No joint pain, swelling or stiffness. Back pain present with any activity. . Gait is steady w/ walker. No recent falls.   NEUROLOGIC: No dizziness, fainting, headache. No change in mental status.   PSYCHIATRIC: No anxiety, depression  Physical Exam Filed Vitals:   01/30/13 1023  BP: 128/53  Pulse: 75  Temp: 98.7 F (37.1 C)   Resp: 20  SpO2: 98%   There is no weight on file to calculate BMI. GENERAL APPEARANCE: No acute distress, appropriately groomed, normal body habitus. Alert, pleasant, conversant. SKIN: No rash, Face with several scabbed superficial lesion c/w scratching HEAD: Normocephalic, atraumatic EYES: Conjunctiva/lids clear.   RESPIRATORY: Breathing is even, unlabored. Lungs with few crackles on left, clear on right.l.   CARDIOVASCULAR: Heart RRR. Grade 3/4 holosystolic murmur (not new)                          EDEMA: No bilateral pedal /ankle edema.   MUSCULOSKELETAL.  Tenderness across the entire lumbosacral region, also tender at the posterior iliac crests bilaterally.  PSYCHIATRIC: Mood and affect appropriate to situation   ASSESSMENT/PLAN  CONGESTIVE HEART FAILURE Symptoms significantly improved today; less SOB, no LE edema. Continue BID Lasix today, start QD dosing tomorrow, BMP scheduled 02/02/13. Cardiology appointment is scheduled 11/10.  Anemia, iron deficiency CBC with significant decrease in H/H, Fe level very low as well. Recommend transfusion, patient does not want to go to hospital. Her SOB has improved with treatment of CHF, continues to be very fatigued, BP/P are stable. Have arranged out-pt transfusion for 11/13.2014 (first available) with patient understanding hospital admission will be necessary if her condition worsens.    Follow up: As needed or as scheduled in [facility]   Lab 02/02/13 BMP  Marvin Maenza T.Temprence Rhines, NP-C 01/30/2013

## 2013-01-30 NOTE — Telephone Encounter (Signed)
Called [facility] and spoke with pt's nurse, Mrs. Cheek, RN and she checked patient's o2 sats this am and o2 sats on room air were 84%. Nurse faxed over vital sheet and I have left message for Synetta Fail with [employer] to return my call. Will fax order for o2 at 2 lpm continuous o2. Will scan this into Epic for our records. Waiting on Synetta Fail to return my call. [name]

## 2013-02-02 ENCOUNTER — Ambulatory Visit (INDEPENDENT_AMBULATORY_CARE_PROVIDER_SITE_OTHER): Payer: Medicare Other | Admitting: Cardiovascular Disease

## 2013-02-02 ENCOUNTER — Encounter: Payer: Self-pay | Admitting: Cardiovascular Disease

## 2013-02-02 ENCOUNTER — Telehealth: Payer: Self-pay | Admitting: Pulmonary Disease

## 2013-02-02 VITALS — BP 152/78 | HR 81 | Ht 64.5 in | Wt 165.7 lb

## 2013-02-02 DIAGNOSIS — E785 Hyperlipidemia, unspecified: Secondary | ICD-10-CM | POA: Insufficient documentation

## 2013-02-02 DIAGNOSIS — I509 Heart failure, unspecified: Secondary | ICD-10-CM

## 2013-02-02 DIAGNOSIS — I10 Essential (primary) hypertension: Secondary | ICD-10-CM | POA: Insufficient documentation

## 2013-02-02 DIAGNOSIS — I5032 Chronic diastolic (congestive) heart failure: Secondary | ICD-10-CM

## 2013-02-02 LAB — BASIC METABOLIC PANEL
BUN: 26 mg/dL — AB (ref 4–21)
Sodium: 143 mmol/L (ref 137–147)

## 2013-02-02 NOTE — Progress Notes (Signed)
02/02/2013 [name]   08-29-1919  [id number]  Primary Physician GREEN, Lenon Curt, MD Primary Cardiologist: Runell Gess MD [name]   HPI:  The patient is a 79 year old, mildly overweight, widowed Caucasian female, mother of 2 living children (1 deceased,) grandmother to 6 grandchildren who I last saw in the office December 23, 2010. She has a history of hypertension, hyperlipidemia and COPD with macular degeneration. She is legally blind. She has had remote TIAs in the past with a known occluded left internal carotid artery, a widely patent right internal carotid artery which we have followed by duplex ultrasound most recently on May 06, 2012. She has had an echo in the past which showed normal LV function with mild aortic sclerosis. She denies chest pain or shortness of breath. She did apparently have a perforated colon since I last saw her with abscess formation requiring prolonged hospitalization.  Since I saw her 8 months ago she developed progressive shortness of breath and some lower chin edema. She sought her pulmonologist recently he began her on increasing doses of diuretics for several days we resulting in marked improvement in her edema. She does have a history of mild aortic stenosis by 2-D echo 4 years ago with normal LV function.    Current Outpatient Prescriptions  Medication Sig Dispense Refill  . acetaminophen (TYLENOL) 325 MG tablet Take 650 mg by mouth every 6 (six) hours as needed. For pain. Take 2 twice daily for pain      . acyclovir ointment (ZOVIRAX) 5 % Apply 1 application topically every 3 (three) hours as needed. For cold sores      . amoxicillin (AMOXIL) 500 MG capsule Take 2,000 mg by mouth once. Prior to dental procedure      . chlorhexidine (PERIDEX) 0.12 % solution Use as directed 15 mLs in the mouth or throat. swish 15 mls for 30 sec then spit, twice daily      . Cholecalciferol (VITAMIN D) 2000 UNITS CAPS Take 1 capsule by  mouth daily.      Marland Kitchen Clocortolone Pivalate (CLODERM) 0.1 % cream Apply 1 application topically 2 (two) times daily. Apply to lesion on left cheek      . desonide (DESOWEN) 0.05 % cream Apply to healing areas on face as needed      . donepezil (ARICEPT) 10 MG tablet Take 10 mg by mouth daily.      Marland Kitchen estrogens, conjugated, (PREMARIN) 0.3 MG tablet Take 0.3 mg by mouth daily. Take one daily      . fluticasone (FLONASE) 50 MCG/ACT nasal spray Place 2 sprays into the nose daily as needed. For dry nasal passages      . folic acid (FOLVITE) 1 MG tablet Take 1 mg by mouth daily.      . furosemide (LASIX) 40 MG tablet Take 1.5 tablets (60 mg total) by mouth 2 (two) times daily at 10 am and 4 pm. Take 1.5 tablets BID today, start 1.5 tablets daily 01/31/2013.    0  . gabapentin (NEURONTIN) 100 MG capsule Take 100 mg by mouth. Take 2 caps at bedtime      . HYDROcodone-acetaminophen (NORCO/VICODIN) 5-325 MG per tablet Take 1 tablet by mouth. Take one every 4 hours as needed for pain      . hydrocortisone-pramoxine (ANALPRAM-HC) 2.5-1 % rectal cream Place 1 application rectally 2 (two) times daily as needed. For hemorrhoids      . hydrOXYzine (ATARAX/VISTARIL) 25 MG tablet Take 2 tablets (  50 mg total) by mouth at bedtime. May have additional 25 mg every 8 hours as needed for itching      . ipratropium-albuterol (DUONEB) 0.5-2.5 (3) MG/3ML SOLN Take 3 mLs by nebulization every 6 (six) hours as needed. For wheezing and shortness of breath      . levothyroxine (SYNTHROID, LEVOTHROID) 88 MCG tablet Take 88 mcg by mouth daily before breakfast. Take one tablet daily for thyroid      . Multiple Vitamins-Minerals (PRESERVISION AREDS PO) Take by mouth. Take one twice daily      . olopatadine (PATANOL) 0.1 % ophthalmic solution Place 1 drop into both eyes 2 (two) times daily as needed. For dry eyes      . OVER THE COUNTER MEDICATION Cera Ve 1.3, 6-11 apply to dry skin daily      . polyethylene glycol (MIRALAX / GLYCOLAX)  packet Take 17 g by mouth daily.      . potassium chloride (K-DUR) 10 MEQ tablet Take 2 tablets (20 mEq total) by mouth daily.  30 tablet  0  . sennosides-docusate sodium (SENOKOT-S) 8.6-50 MG tablet Take 1 tablet by mouth. Take 2 tabs at bedtime, take one as needed      . tiotropium (SPIRIVA) 18 MCG inhalation capsule Place 18 mcg into inhaler and inhale daily.      Marland Kitchen warfarin (COUMADIN) 5 MG tablet Take 1 tablet (5 mg total) by mouth daily. Take one tablet daily or as directed for anticoagulation      . escitalopram (LEXAPRO) 20 MG tablet Take 20 mg by mouth daily.       No current facility-administered medications for this visit.    Allergies  Allergen Reactions  . Cortisone Other (See Comments)  . Minocin [Minocycline Hcl] Other (See Comments)    Unknown  . Minocycline Hcl   . Neomycin-Bacitracin Zn-Polymyx   . Neosporin [Neomycin-Bacitracin Zn-Polymyx] Other (See Comments)    Unknown  . Other Other (See Comments)    Steroidal Neuromuscular Blockers  . Tetanus Toxoids Other (See Comments)    Unknown    History   Social History  . Marital Status: Widowed    Spouse Name: N/A    Number of Children: N/A  . Years of Education: N/A   Occupational History  . Not on file.   Social History Main Topics  . Smoking status: Former Smoker -- 1.00 packs/day for 50 years    Types: Cigarettes    Quit date: 08/08/1993  . Smokeless tobacco: Never Used  . Alcohol Use: Yes     Comment: 10/15/11 "have a drink when I go out to dinner; don't go out often"  . Drug Use: No  . Sexual Activity: No   Other Topics Concern  . Not on file   Social History Narrative   ** Merged History Encounter **         Review of Systems: General: negative for chills, fever, night sweats or weight changes.  Cardiovascular: negative for chest pain, dyspnea on exertion, edema, orthopnea, palpitations, paroxysmal nocturnal dyspnea or shortness of breath Dermatological: negative for rash Respiratory:  negative for cough or wheezing Urologic: negative for hematuria Abdominal: negative for nausea, vomiting, diarrhea, bright red blood per rectum, melena, or hematemesis Neurologic: negative for visual changes, syncope, or dizziness All other systems reviewed and are otherwise negative except as noted above.    Blood pressure 152/78, pulse 81, height 5' 4.5" (1.638 m), weight 165 lb 11.2 oz (75.161 kg).  General appearance: alert and  no distress Neck: no adenopathy, no JVD, supple, symmetrical, trachea midline, thyroid not enlarged, symmetric, no tenderness/mass/nodules and bilateral carotid bruits right louder than left Lungs: clear to auscultation bilaterally Heart: soft outflow tract murmur Extremities: extremities normal, atraumatic, no cyanosis or edema  EKG normal sinus rhythm at 81 without ST or T wave changes  ASSESSMENT AND PLAN:   CONGESTIVE HEART FAILURE Patient apparently has had lower extremity edema and increasing shortness of breath recently. Her diuretics were increased for several days and then her maintenance dose was resumed. Her edema has resolved.2-D echo from 3 years ago revealed normal LV systolic function with mild aortic stenosis. She does have an outflow tract murmur. The repeat her 2-D echo and have written guidelines for transient increase in diuretics if her weight goes up and/or she has recurrent edema. I will see her back in 6-8 weeks.      Runell Gess MD FACP,FACC,FAHA, [facility] 02/02/2013 4:15 PM

## 2013-02-02 NOTE — Assessment & Plan Note (Signed)
Patient apparently has had lower extremity edema and increasing shortness of breath recently. Her diuretics were increased for several days and then her maintenance dose was resumed. Her edema has resolved.2-D echo from 3 years ago revealed normal LV systolic function with mild aortic stenosis. She does have an outflow tract murmur. The repeat her 2-D echo and have written guidelines for transient increase in diuretics if her weight goes up and/or she has recurrent edema. I will see her back in 6-8 weeks.

## 2013-02-02 NOTE — Telephone Encounter (Signed)
Spoke with Cerise. Pt has been scheduled on 02/23/13 at 1:30pm. Nothing further was needed.

## 2013-02-02 NOTE — Patient Instructions (Addendum)
  We will see you back in follow up in 6-8 weeks with Dr Allyson Sabal  Dr Allyson Sabal has ordered an echocardiogram.  Dr Allyson Sabal is authorizing Ms Wine-Shelton to increase her lasix to 60mg  twice a day if her weight increases 3 pounds or more from one day to the next or if she increases in her weight more than 5 pounds in one week.  Otherwise her lasix dose can remain at 60 mg daily.

## 2013-02-03 ENCOUNTER — Telehealth: Payer: Self-pay

## 2013-02-03 ENCOUNTER — Encounter: Payer: Self-pay | Admitting: Cardiovascular Disease

## 2013-02-03 NOTE — Telephone Encounter (Signed)
Called pt's POA and was put on a 3-way call with the pt's POA and the pt.  Advised that the CT showed CHF which was expected, and verified Dr. Hazle Coca recommendations with her follow-up and lasix.  Confirmed that pt has an appt on 02/23/13 with Dr. Craige Cotta.  Pt and pt's POA aware. Chrstopher Malenfant L.

## 2013-02-04 ENCOUNTER — Other Ambulatory Visit (HOSPITAL_COMMUNITY): Payer: Self-pay | Admitting: *Deleted

## 2013-02-05 ENCOUNTER — Telehealth: Payer: Self-pay | Admitting: Pulmonary Disease

## 2013-02-05 ENCOUNTER — Encounter (HOSPITAL_COMMUNITY)
Admission: RE | Admit: 2013-02-05 | Discharge: 2013-02-05 | Disposition: A | Discharge status: Home / Self Care | Payer: Medicare Other | Source: Ambulatory Visit | Attending: Internal Medicine | Admitting: Internal Medicine

## 2013-02-05 DIAGNOSIS — D649 Anemia, unspecified: Secondary | ICD-10-CM | POA: Insufficient documentation

## 2013-02-05 DIAGNOSIS — I509 Heart failure, unspecified: Secondary | ICD-10-CM

## 2013-02-05 LAB — ABO/RH: ABO/RH(D): A POS

## 2013-02-05 LAB — PREPARE RBC (CROSSMATCH)

## 2013-02-05 MED ORDER — FUROSEMIDE 10 MG/ML IJ SOLN
60.0000 mg | Freq: Once | INTRAMUSCULAR | Status: AC
  Administered 2013-02-05: 13:00:00 40 mg via INTRAVENOUS
  Filled 2013-02-05: qty 6

## 2013-02-05 MED ORDER — FUROSEMIDE 10 MG/ML IJ SOLN
40.0000 mg | Freq: Once | INTRAMUSCULAR | Status: DC
  Filled 2013-02-05: qty 4

## 2013-02-05 NOTE — Telephone Encounter (Signed)
Error.  Wrong doc °

## 2013-02-05 NOTE — Telephone Encounter (Signed)
Spoke with the pt's daughter  She is requesting that we refer the pt to a different cardiologist  She was seen by Dr. Allyson Sabal and was unhappy with the visit  She states that she had to wait over an hour to be seen and her appt lasted less than 5 minutes She states that he "did'nt seem to care about her" Dr Kendrick Fries, please advise thanks!

## 2013-02-05 NOTE — Telephone Encounter (Signed)
She should see [place] cardiology.  [facility], [facility], or Bensimhon; any of the above are fine

## 2013-02-05 NOTE — Telephone Encounter (Signed)
Called 1st # listed.  Was advised [name] is not in right now and would be in later this evening. Called [name]'s cell # - [email or web address]

## 2013-02-05 NOTE — Progress Notes (Signed)
1610 Spoke with Gweneth Dimitri and advised her that patient did not have her po lasix this am.  Claudette changed order between units to 60 mgm iv. She advised patient should be ok to miss her am dose.

## 2013-02-06 LAB — TYPE AND SCREEN
Antibody Screen: NEGATIVE
Unit division: 0

## 2013-02-06 NOTE — Telephone Encounter (Signed)
LM for pt's daughter to call back.

## 2013-02-09 ENCOUNTER — Telehealth: Payer: Self-pay | Admitting: Pulmonary Disease

## 2013-02-09 NOTE — Telephone Encounter (Signed)
I spoke with pt daughter. They have decided to cancel everything done by Dr. Allyson Sabal until she see's dr. Jens Som. Nothing further needed

## 2013-02-09 NOTE — Telephone Encounter (Signed)
Order placed. ATC home number, NA. LMTCB on alternates number given .Carron Curie, CMA

## 2013-02-09 NOTE — Telephone Encounter (Signed)
Pt calling back again pt is schedule with [facility] 12/08 at 2:00 pt aware of appt but pt is wanting to know if she should keep echo schedule by Dr Allyson Sabal 12/2 at 1:00 should she keep this or just establish by [facility]

## 2013-02-09 NOTE — Telephone Encounter (Signed)
Darrell Jewel, CMA at 02/09/2013 8:42 AM   Status: Signed       Order placed. ATC home number, NA. LMTCB on alternates number given .Carron Curie, CMA        Caryl Ada, CMA at 02/06/2013 8:49 AM    Status: Signed        LM for pt's daughter to call back.        Lupita Leash, MD at 02/05/2013 7:03 PM    Status: Signed        She should see [place] cardiology. [facility], [facility], or B[facility]imhon; any of the above are fine  --  lmtcb x1 to make daughter aware of referral

## 2013-02-12 LAB — CBC AND DIFFERENTIAL
HCT: 36 % (ref 36–46)
Platelets: 277 10*3/uL (ref 150–399)

## 2013-02-23 ENCOUNTER — Ambulatory Visit: Payer: Medicare Other | Admitting: Pulmonary Disease

## 2013-02-24 ENCOUNTER — Ambulatory Visit (HOSPITAL_COMMUNITY): Payer: Medicare Other

## 2013-02-25 ENCOUNTER — Non-Acute Institutional Stay: Payer: Medicare Other | Admitting: Geriatric Medicine

## 2013-02-25 ENCOUNTER — Encounter: Payer: Self-pay | Admitting: Geriatric Medicine

## 2013-02-25 VITALS — BP 126/60 | HR 72 | Wt 163.0 lb

## 2013-02-25 DIAGNOSIS — D509 Iron deficiency anemia, unspecified: Secondary | ICD-10-CM

## 2013-02-25 DIAGNOSIS — Z7901 Long term (current) use of anticoagulants: Secondary | ICD-10-CM

## 2013-02-25 DIAGNOSIS — I509 Heart failure, unspecified: Secondary | ICD-10-CM

## 2013-02-25 LAB — POCT INR: INR: 1.9 — AB (ref <=1.1)

## 2013-02-25 NOTE — Assessment & Plan Note (Signed)
INR remains in therapeutic range, continue her warfarin dose, repeat INR 4 weeks

## 2013-02-25 NOTE — Assessment & Plan Note (Signed)
Patient tolerated transfusion without incident, hemoglobin and hematocrit are improved. MCV, MCH remain quite low. Patient continues to complain of fatigue and shortness of breath. Likely his combination of anemia and heart failure. Repeat lab tomorrow.

## 2013-02-25 NOTE — Progress Notes (Signed)
Patient ID: [name], female   DOB: 1919/03/28, 79 y.o.   MRN: [id number] [facility] [phone])  Code Status: DNR Contact Information   Name Relation Home Work Mobile   [name],[name] [phone]     [name] [phone]        Chief Complaint  Patient presents with  . Medical Managment of Chronic Issues    coag check     HPI: This is a 79 y.o. female resident of [name], Assisted [facility] section evaluated today for management of ongoing medical issues.   Last visit:  CONGESTIVE HEART FAILURE Symptoms significantly improved today; less SOB, no LE edema. Continue BID Lasix today, start QD dosing tomorrow, BMP scheduled 02/02/13. Cardiology appointment is scheduled 11/10.  Anemia, iron deficiency CBC with significant decrease in H/H, Fe level very low as well. Recommend transfusion, patient does not want to go to hospital. Her SOB has improved with treatment of CHF, continues to be very fatigued, BP/P are stable. Have arranged out-pt transfusion for 11/13.2014 (first available) with patient understanding hospital admission will be necessary if her condition worsens.  Long term (current) use of anticoagulants INR is in therapeutic range, continue current warfarin dose, next INR 4 weeks   Since last visit, patient was transfused with 2 units packed red blood cells. Hemoglobin and hematocrit improved, MCV MCH still with evidence of iron deficiency. Patient reports she felt better for just a few days now is again complaining of severe fatigue and generalized weakness. Reports that she feels short of breath; is using oxygen nearly all the time.   She was evaluated by Dr. Nanetta Batty on November 10, he recommended 2-D echocardiogram. The patient has decided to seek another cardiology opinion and has an appointment with Dr. Jens Som on December 8. Echocardiogram has not been completed as yet.   Allergies  Allergen  Reactions  . Cortisone Other (See Comments)  . Minocin [Minocycline Hcl] Other (See Comments)    Unknown  . Minocycline Hcl   . Neomycin-Bacitracin Zn-Polymyx   . Neosporin [Neomycin-Bacitracin Zn-Polymyx] Other (See Comments)    Unknown  . Other Other (See Comments)    Steroidal Neuromuscular Blockers  . Tetanus Toxoids Other (See Comments)    Unknown   Medications Reviewed  DATA REVIEWED  Radiologic Exams:   Cardiovascular Exams:   Laboratory Studies  [facility] 08/04/2012 WBC 7.8, hemoglobin 10.6, hematocrit 33.5, platelets 268             Glucose 102, BUN 20, creatinine 1.11, sodium 136, potassium 4.1             UA negative for signs of infection 11/25/12 INR 2.2  12/25/2012 WBC 6.8, hemoglobin 9.2, hematocrit 29.4, platelets 287             TSH 1.22             B12 584  01/29/2013 WBC 7.3, Hgb 7.7, Hct 25.3, MCV 67.6, PLt 31  Fe 13,  Ferritin 8  BNP 853.4 Lab Results  Component Value Date   WBC 7.3 02/12/2013   HGB 11.3* 02/12/2013   HCT 36 02/12/2013   MCV  70.3  02/12/2013     MCH   22.1   02/12/2013    PLT 277 02/12/2013        GLUCOSE 122  02/02/2013    NA 143 02/02/2013   K 3.9 02/02/2013   CL 107  02/02/2013    CREATININE 1.4* 02/02/2013   BUN  26* 02/02/2013    POCT ([name])             12/24/2012 INR 2.4             01/28/2013 INR 2.5  02/25/2013 INR 1.9     Review of Systems  DATA OBTAINED: from patient,  GENERAL: Feels tired out, very weak has difficulty lifting her arms and legs "I'm just tired out"  No fevers, decreased activity status, appetite. No weight change SKIN: itchy, dry skin EYES: Very poor eyesight (macular degeneration), is looking  Into a new magnifying machine EARS: Decreased hearing RESPIRATORY: No cough, wheezing, SOB today, Using supplemental O2 AAT CARDIAC: No chest pain, palpitations. No Bilateral LE edema  today GI: No abdominal pain. No N/V/D or constipation. No heartburn or reflux.   MUSCULOSKELETAL: No  joint pain, swelling or stiffness. Back pain present with any activity. . Gait is steady w/ walker. No recent falls.   NEUROLOGIC: No dizziness, fainting, headache. No change in mental status.   PSYCHIATRIC: No anxiety, depression  Physical Exam Filed Vitals:   02/25/13 1618  BP: 126/60  Pulse: 72  Weight: 163 lb (73.936 kg)  SpO2: 94%   Body mass index is 27.97 kg/(m^2).  GENERAL APPEARANCE: No acute distress, appropriately groomed, normal body habitus. Alert, pleasant, conversant. SKIN: No rash, Face with several scabbed superficial lesion c/w scratching HEAD: Normocephalic, atraumatic EYES: Conjunctiva/lids clear.   RESPIRATORY: Breathing is even, unlabored. Lungs with few crackles on left, clear on right.l.   CARDIOVASCULAR: Heart RRR. Grade 3/4 holosystolic murmur (not new)                          EDEMA: No bilateral pedal /ankle edema.   MUSCULOSKELETAL.  Tenderness across the entire lumbosacral region, also tender at the posterior iliac crests bilaterally.  PSYCHIATRIC: Mood and affect appropriate to situation   ASSESSMENT/PLAN   CONGESTIVE HEART FAILURE Patient without signs of volume overload today, which has been stable last several weeks. She continues to feel very tired and short of breath. Will repeat BNP tomorrow. Patient is following up with Dr. Jens Som on December 8.  Anemia, iron deficiency Patient tolerated transfusion without incident, hemoglobin and hematocrit are improved. MCV, MCH remain quite low. Patient continues to complain of fatigue and shortness of breath. Likely his combination of anemia and heart failure. Repeat lab tomorrow.  Long term (current) use of anticoagulants INR remains in therapeutic range, continue her warfarin dose, repeat INR 4 weeks    Follow up: 4 weeks INR    Lab 02/26/2013 CBC, CMP, BNP  Knolan Simien T.Steffan Caniglia, NP-C 02/25/2013

## 2013-02-25 NOTE — Assessment & Plan Note (Signed)
Patient without signs of volume overload today, which has been stable last several weeks. She continues to feel very tired and short of breath. Will repeat BNP tomorrow. Patient is following up with Dr. Jens Som on December 8.

## 2013-02-26 LAB — CBC AND DIFFERENTIAL
HCT: 34 % — AB (ref 36–46)
Hemoglobin: 10.9 g/dL — AB (ref 12.0–16.0)
Platelets: 297 10*3/uL (ref 150–399)

## 2013-03-02 ENCOUNTER — Encounter: Payer: Self-pay | Admitting: Cardiology

## 2013-03-02 ENCOUNTER — Ambulatory Visit (INDEPENDENT_AMBULATORY_CARE_PROVIDER_SITE_OTHER): Payer: Medicare Other | Admitting: Cardiology

## 2013-03-02 VITALS — BP 164/88 | HR 77 | Ht 64.5 in | Wt 162.0 lb

## 2013-03-02 DIAGNOSIS — I350 Nonrheumatic aortic (valve) stenosis: Secondary | ICD-10-CM

## 2013-03-02 DIAGNOSIS — I6529 Occlusion and stenosis of unspecified carotid artery: Secondary | ICD-10-CM

## 2013-03-02 DIAGNOSIS — I10 Essential (primary) hypertension: Secondary | ICD-10-CM

## 2013-03-02 DIAGNOSIS — I359 Nonrheumatic aortic valve disorder, unspecified: Secondary | ICD-10-CM

## 2013-03-02 DIAGNOSIS — I509 Heart failure, unspecified: Secondary | ICD-10-CM

## 2013-03-02 DIAGNOSIS — I6522 Occlusion and stenosis of left carotid artery: Secondary | ICD-10-CM

## 2013-03-02 DIAGNOSIS — E785 Hyperlipidemia, unspecified: Secondary | ICD-10-CM

## 2013-03-02 HISTORY — DX: Nonrheumatic aortic (valve) stenosis: I35.0

## 2013-03-02 MED ORDER — PRAVASTATIN SODIUM 40 MG PO TABS: 40.0000 mg | ORAL_TABLET | Freq: Every evening | ORAL | Status: AC

## 2013-03-02 NOTE — Patient Instructions (Addendum)
Your physician recommends that you schedule a follow-up appointment in: 3 MONTHS WITH DR Jens Som  Your physician has requested that you have an echocardiogram. Echocardiography is a painless test that uses sound waves to create images of your heart. It provides your doctor with information about the size and shape of your heart and how well your heart's chambers and valves are working. This procedure takes approximately one hour. There are no restrictions for this procedure.   START PRAVASTATIN 40 MG ONCE DAILY  Your physician recommends that you return for lab work in: 6 WEEKS= DO NOT PRIOR TO LAB WORK

## 2013-03-02 NOTE — Assessment & Plan Note (Signed)
Repeat echocardiogram. 

## 2013-03-02 NOTE — Assessment & Plan Note (Signed)
Add Pravachol 40 mg daily.check lipids and liver in 6 weeks.

## 2013-03-02 NOTE — Assessment & Plan Note (Addendum)
Patient is euvolemic on examination.continue present dose of Lasix. Take an additional 60 mg daily as needed for edema or weight gain of 2 pounds. Patient instructed on low sodium diet and fluid restriction. Repeat echocardiogram.

## 2013-03-02 NOTE — Assessment & Plan Note (Signed)
Add Pravachol 40 mg daily given cerebrovascular disease. Check lipids and liver in 6 weeks. We discussed followup Dopplers. She would never be agreeable carotid endarterectomy. I therefore would not pursue further studies.

## 2013-03-02 NOTE — Progress Notes (Signed)
HPI: 79 year old female for evaluation of congestive heart failure. Previously followed by Dr. Allyson Sabal. Echocardiogram from 2011 showed normal LV function and mild aortic stenosis based on previous clinic notes. Carotid Dopplers in February of 2014 showed an occluded left carotid artery and no significant obstruction on the right. Patient has dyspnea on exertion but no orthopnea, PND, pedal edema, exertional chest pain or syncope.  Current Outpatient Prescriptions  Medication Sig Dispense Refill  . acetaminophen (TYLENOL) 325 MG tablet Take 650 mg by mouth every 6 (six) hours as needed. For pain. Take 2 twice daily for pain      . amoxicillin (AMOXIL) 500 MG capsule Take 2,000 mg by mouth once. Prior to dental procedure      . betamethasone dipropionate (DIPROLENE) 0.05 % cream Apply topically 2 (two) times daily.      . chlorhexidine (PERIDEX) 0.12 % solution Use as directed 15 mLs in the mouth or throat. swish 15 mls for 30 sec then spit, twice daily      . Cholecalciferol (VITAMIN D) 2000 UNITS CAPS Take 1 capsule by mouth daily.      Marland Kitchen Clocortolone Pivalate (CLODERM) 0.1 % cream Apply 1 application topically 2 (two) times daily. Apply to lesion on left cheek      . desonide (DESOWEN) 0.05 % cream Apply to healing areas on face as needed      . donepezil (ARICEPT) 10 MG tablet Take 10 mg by mouth daily.      . Emollient (CERAVE EX) Apply topically. AS DIRECTED      . escitalopram (LEXAPRO) 20 MG tablet Take 20 mg by mouth daily.      Marland Kitchen estrogens, conjugated, (PREMARIN) 0.3 MG tablet Take 0.3 mg by mouth daily. Take one daily      . fluticasone (FLONASE) 50 MCG/ACT nasal spray Place 2 sprays into the nose daily as needed. For dry nasal passages      . folic acid (FOLVITE) 1 MG tablet Take 1 mg by mouth daily.      . furosemide (LASIX) 40 MG tablet Take 60 mg by mouth 2 (two) times daily at 10 am and 4 pm. Take 60mg  once in morning      . gabapentin (NEURONTIN) 100 MG capsule Take 100 mg by  mouth. Take 2 caps at bedtime      . HYDROcodone-acetaminophen (NORCO/VICODIN) 5-325 MG per tablet Take 1 tablet by mouth. Take one every 4 hours as needed for pain      . hydrocortisone-pramoxine (ANALPRAM-HC) 2.5-1 % rectal cream Place 1 application rectally 2 (two) times daily as needed. For hemorrhoids      . hydrOXYzine (ATARAX/VISTARIL) 25 MG tablet Take 2 tablets (50 mg total) by mouth at bedtime. May have additional 25 mg every 8 hours as needed for itching      . ipratropium-albuterol (DUONEB) 0.5-2.5 (3) MG/3ML SOLN Take 3 mLs by nebulization every 6 (six) hours as needed. For wheezing and shortness of breath      . levothyroxine (SYNTHROID, LEVOTHROID) 88 MCG tablet Take 88 mcg by mouth daily before breakfast. Take one tablet daily for thyroid      . Multiple Vitamins-Minerals (PRESERVISION AREDS PO) Take by mouth. Take one twice daily      . olopatadine (PATANOL) 0.1 % ophthalmic solution Place 1 drop into both eyes 2 (two) times daily as needed. For dry eyes      . OVER THE COUNTER MEDICATION Cera Ve 1.3, 6-11 apply to dry  skin daily      . polyethylene glycol (MIRALAX / GLYCOLAX) packet Take 17 g by mouth daily.      . potassium chloride (K-DUR) 10 MEQ tablet Take 2 tablets (20 mEq total) by mouth daily.  30 tablet  0  . sennosides-docusate sodium (SENOKOT-S) 8.6-50 MG tablet Take 1 tablet by mouth. Take 2 tabs at bedtime, take one as needed      . tiotropium (SPIRIVA) 18 MCG inhalation capsule Place 18 mcg into inhaler and inhale daily.      Marland Kitchen warfarin (COUMADIN) 5 MG tablet Take 1 tablet (5 mg total) by mouth daily. Take one tablet daily or as directed for anticoagulation      . acyclovir ointment (ZOVIRAX) 5 % Apply 1 application topically every 3 (three) hours as needed. For cold sores       No current facility-administered medications for this visit.    Allergies  Allergen Reactions  . Cortisone Other (See Comments)  . Minocin [Minocycline Hcl] Other (See Comments)     Unknown  . Minocycline Hcl   . Neomycin-Bacitracin Zn-Polymyx   . Neosporin [Neomycin-Bacitracin Zn-Polymyx] Other (See Comments)    Unknown  . Other Other (See Comments)    Steroidal Neuromuscular Blockers  . Tetanus Toxoids Other (See Comments)    Unknown    Past Medical History  Diagnosis Date  . Cholelithiases   . Hypothyroid   . Leg paresthesia   . Aortic valve stenosis   . Arthritis   . Hyperlipidemia   . Dementia   . Carotid artery occlusion   . Renal disorder   . Hypertension   . COPD (chronic obstructive pulmonary disease)   . SEIZURE DISORDER 11/05/2006  . GERD 11/05/2006  . CVA 02/07/2007  . Macular degeneration of both eyes   . Blind in both eyes   . Peripheral neuropathy   . Pneumonia     "many times"  . Internal hemorrhoid, bleeding   . Malaria     "as a child"  . Skin cancer     "nose, face mostly"  . Chronic lower back pain     "when I go to the bathroom"  . Intestinal infection due to Clostridium difficile 09/2011  . Benign neoplasm of breast   . Gout, unspecified   . Anemia   . Anxiety   . Other dysfunctions of sleep stages or arousal from sleep   . Depression   . Chronic rhinitis   . Abscess of lung(513.0)     RUL  . Diaphragmatic hernia without mention of obstruction or gangrene   . Diverticulosis of colon (without mention of hemorrhage)   . Acute duodenal ulcer with perforation and obstruction 08/2011    perforation  . Hemorrhage of rectum and anus   . Abscess of intestine 09/2011    drainage  . Other psoriasis   . Lumbago   . Nonspecific elevation of levels of transaminase or lactic acid dehydrogenase (LDH)   . Solitary pulmonary nodule     RLL nodule 1 cm  . Transient ischemic attack (TIA), and cerebral infarction without residual deficits(V12.54)   . Long term (current) use of anticoagulants   . Sacroiliitis 08/27/2012  . Anemia, iron deficiency 10/16/2011  . Congestive heart failure     Past Surgical History  Procedure Laterality  Date  . Vesicovaginal fistula closure w/ tah    . Tonsillectomy    . Breast lumpectomy      bilaterally  . Cataract extraction w/  intraocular lens  implant, bilateral    . Skin cancer excision      "nose and face"  . Abdominal hysterectomy    . Hemorrhoid surgery  11/2008    banding    History   Social History  . Marital Status: Widowed    Spouse Name: N/A    Number of Children: 3  . Years of Education: N/A   Occupational History  .     Social History Main Topics  . Smoking status: Former Smoker -- 1.00 packs/day for 50 years    Types: Cigarettes    Quit date: 08/08/1993  . Smokeless tobacco: Never Used  . Alcohol Use: Yes     Comment: 10/15/11 "have a drink when I go out to dinner; don't go out often"  . Drug Use: No  . Sexual Activity: No   Other Topics Concern  . Not on file   Social History Narrative   ** Merged History Encounter **        Family History  Problem Relation Age of Onset  . Hyperlipidemia    . Hypertension    . Stroke    . Colon cancer Daughter   . Heart attack Father   . Kidney failure Father   . Heart disease Son     ROS: occasional hematochezia but no fevers or chills, productive cough, hemoptysis, dysphasia, odynophagia, melena, hematochezia, dysuria, hematuria, rash, seizure activity, orthopnea, PND, claudication. Remaining systems are negative.  Physical Exam:   Blood pressure 164/88, pulse 77, height 5' 4.5" (1.638 m), weight 162 lb (73.483 kg), SpO2 97.00%.  General:  Well developed/well nourished elderly in NAD Skin warm/dry Patient not depressed No peripheral clubbing Back-normal HEENT-normal/normal eyelids Neck supple/normal carotid upstroke bilaterally; no bruits; no JVD; no thyromegaly chest - CTA/ normal expansion CV - RRR/normal S1 and S2; no rubs or gallops;  PMI nondisplaced, 2/6 systolic murmur left sternal border.  Abdomen -NT/ND, no HSM, no mass, + bowel sounds, no bruit Ext-no edema, chords; diminished distal  pulses Neuro-grossly nonfocal  ECG 02/02/2013-sinus rhythm, cannot rule out prior anterior infarct, nonspecific ST changes.

## 2013-03-02 NOTE — Assessment & Plan Note (Signed)
Continue present medications. 

## 2013-03-03 ENCOUNTER — Telehealth: Payer: Self-pay | Admitting: Cardiology

## 2013-03-03 NOTE — Telephone Encounter (Signed)
Follow up        Cerise Rn w/[facility] would like 12/8 notes fax to them # [phone].   The 6 wk lab would you like it to be done at [facility] if so fax and order please. Please call RN Cerise back she has some questions about the pt. [phone]

## 2013-03-03 NOTE — Telephone Encounter (Signed)
Spoke with cerise, office note and order for labs faxed to the number provided.

## 2013-03-23 ENCOUNTER — Encounter: Payer: Self-pay | Admitting: Cardiology

## 2013-03-23 ENCOUNTER — Ambulatory Visit (HOSPITAL_COMMUNITY): Payer: Medicare Other | Attending: Cardiology | Admitting: Radiology

## 2013-03-23 DIAGNOSIS — Z87891 Personal history of nicotine dependence: Secondary | ICD-10-CM | POA: Insufficient documentation

## 2013-03-23 DIAGNOSIS — J449 Chronic obstructive pulmonary disease, unspecified: Secondary | ICD-10-CM | POA: Insufficient documentation

## 2013-03-23 DIAGNOSIS — I6529 Occlusion and stenosis of unspecified carotid artery: Secondary | ICD-10-CM | POA: Insufficient documentation

## 2013-03-23 DIAGNOSIS — I059 Rheumatic mitral valve disease, unspecified: Secondary | ICD-10-CM | POA: Insufficient documentation

## 2013-03-23 DIAGNOSIS — I10 Essential (primary) hypertension: Secondary | ICD-10-CM | POA: Insufficient documentation

## 2013-03-23 DIAGNOSIS — I079 Rheumatic tricuspid valve disease, unspecified: Secondary | ICD-10-CM | POA: Insufficient documentation

## 2013-03-23 DIAGNOSIS — I379 Nonrheumatic pulmonary valve disorder, unspecified: Secondary | ICD-10-CM | POA: Insufficient documentation

## 2013-03-23 DIAGNOSIS — J4489 Other specified chronic obstructive pulmonary disease: Secondary | ICD-10-CM | POA: Insufficient documentation

## 2013-03-23 DIAGNOSIS — I509 Heart failure, unspecified: Secondary | ICD-10-CM | POA: Insufficient documentation

## 2013-03-23 DIAGNOSIS — Z8673 Personal history of transient ischemic attack (TIA), and cerebral infarction without residual deficits: Secondary | ICD-10-CM | POA: Insufficient documentation

## 2013-03-23 DIAGNOSIS — I359 Nonrheumatic aortic valve disorder, unspecified: Secondary | ICD-10-CM

## 2013-03-23 DIAGNOSIS — E785 Hyperlipidemia, unspecified: Secondary | ICD-10-CM | POA: Insufficient documentation

## 2013-03-23 DIAGNOSIS — J9610 Chronic respiratory failure, unspecified whether with hypoxia or hypercapnia: Secondary | ICD-10-CM | POA: Insufficient documentation

## 2013-03-23 NOTE — Progress Notes (Signed)
Echocardiogram performed.  

## 2013-03-25 ENCOUNTER — Encounter: Payer: Self-pay | Admitting: Geriatric Medicine

## 2013-03-25 ENCOUNTER — Non-Acute Institutional Stay: Payer: Medicare Other | Admitting: Geriatric Medicine

## 2013-03-25 VITALS — BP 140/80 | HR 68 | Ht 64.5 in | Wt 160.0 lb

## 2013-03-25 DIAGNOSIS — I359 Nonrheumatic aortic valve disorder, unspecified: Secondary | ICD-10-CM

## 2013-03-25 DIAGNOSIS — I350 Nonrheumatic aortic (valve) stenosis: Secondary | ICD-10-CM

## 2013-03-25 DIAGNOSIS — D509 Iron deficiency anemia, unspecified: Secondary | ICD-10-CM

## 2013-03-25 DIAGNOSIS — Z7901 Long term (current) use of anticoagulants: Secondary | ICD-10-CM

## 2013-03-25 DIAGNOSIS — I509 Heart failure, unspecified: Secondary | ICD-10-CM

## 2013-03-25 LAB — POCT INR: INR: 4.4 — AB (ref 0.9–1.1)

## 2013-03-25 NOTE — Assessment & Plan Note (Signed)
Minimal drop in hemoglobin hematocrit, other indices indicative of iron deficiency. Patient does not tolerate iron supplements. Continue to monitor intervals.

## 2013-03-25 NOTE — Progress Notes (Signed)
Patient ID: [name], female   DOB: 1919/04/30, 79 y.o.   MRN: [id number]  [facility] [id number])  Code Status: DNR Contact Information   Name Relation Home Work Mobile   [name],[name] [phone]     [name] [phone]        Chief Complaint  Patient presents with  . Medical Managment of Chronic Issues    coag check     HPI: This is a 79 y.o. female resident of [name] evaluated today for anticoagulation management.  Last visit:  CONGESTIVE HEART FAILURE Patient without signs of volume overload today, which has been stable last several weeks. She continues to feel very tired and short of breath. Will repeat BNP tomorrow. Patient is following up with Dr. Jens Som on December 8.  Anemia, iron deficiency Patient tolerated transfusion without incident, hemoglobin and hematocrit are improved. MCV, MCH remain quite low. Patient continues to complain of fatigue and shortness of breath. Likely his combination of anemia and heart failure. Repeat lab tomorrow.  Long term (current) use of anticoagulants INR remains in therapeutic range, continue her warfarin dose, repeat INR 4 weeks  Since last visit, patient has continued to take warfarin as instructed. She returned to Dr. Jens Som on December 8 as planned. He added pravastatin to her medication regimen. Also recommended a echocardiogram, this was completed December 29. The patient has continued Lasix at 60 mg daily. She's had significant weight loss; 13 pounds in the last few weeks. Continues to complain of shortness of breath though oxygen saturation is adequate on room air. Repeat BNP remained elevated but improved from prior reading. Repeat CBC was completed December 4. This showed mild reduction in her hemoglobin and hematocrit with continued low MCV and MCH. No reports of rectal bleeding   Allergies  Allergen Reactions  . Cortisone  Other (See Comments)  . Minocin [Minocycline Hcl] Other (See Comments)    Unknown  . Minocycline Hcl   . Neomycin-Bacitracin Zn-Polymyx   . Neosporin [Neomycin-Bacitracin Zn-Polymyx] Other (See Comments)    Unknown  . Other Other (See Comments)    Steroidal Neuromuscular Blockers  . Tetanus Toxoids Other (See Comments)    Unknown   Medications Reviewed  DATA REVIEWED  Radiologic Exams:   Cardiovascular Exams  [facility] List: 2006: 2D echo: LV EF 65-75%. Mild AoV stenosis 06/25/2011 2-D echo there is aortic root and valvular sclerosis and/or calcification with moderately restricted leaflet separation consistent with moderate aortic stenosis. There is mild mitral annular calcification Ejection fraction 65-70%   03/23/2013 Transthoracic Echocardiography,  PERFORMING   [facility], [facility]  Study Conclusions - Left ventricle: The cavity size was mildly dilated. Wall   thickness was increased in a pattern of moderate LVH.   Systolic function was normal. The estimated ejection   fraction was in the range of 55% to 60%. - Aortic valve: There was moderate stenosis. Mild regurgitation. - Mitral valve: Calcified annulus. Moderate regurgitation. - Left atrium: The atrium was severely dilated. - Right atrium: The atrium was mildly dilated. - Atrial septum: No defect or patent foramen ovale was identified. - Tricuspid valve: Moderate-severe regurgitation. - Pulmonic valve: Moderate regurgitation. - Pulmonary arteries: PA peak pressure: 71mm Hg (S).   Laboratory Studies  [facility] 08/04/2012 WBC 7.8, hemoglobin 10.6, hematocrit 33.5, platelets 268             Glucose 102, BUN 20, creatinine 1.11, sodium 136, potassium 4.1  UA negative for signs of infection 12/25/2012 WBC 6.8, hemoglobin 9.2, hematocrit 29.4, platelets 287             TSH 1.22             B12 584  01/29/2013 WBC 7.3, Hgb 7.7, Hct 25.3, MCV 67.6, PLt 31  Fe 13,  Ferritin 8  BNP 853.4  Lab Results- [facility]  02/12/2013  Component Value   WBC 7.3   HGB 11.3*   HCT 36   MCV  70.3   MCH   22.1    PLT 277       GLUCOSE 122   NA 143   K 3.9   CL 107   CREATININE 1.4*   BUN 26*    POCT ([name])             12/24/2012 INR 2.4             01/28/2013 INR 2.5  02/25/2013 INR 1.9     Lab Results- [facility] 02/26/2013  Component Value   WBC 6.9   HGB 10.9*   HCT 34*   MCV  68   MCH 21.8   PLT 297       GLUCOSE 98   NA 139   K 3.8   CREATININE 1.4*   BUN 22*       BNP 471   Lab Results- [facility]  Component Value   INR 4.4*    Review of Systems  DATA OBTAINED: from patient,  GENERAL: Tires easily, No fevers,  Appetite fair, significant weight loss.  SKIN: itchy, dry skin EYES: Very poor eyesight (macular degeneration), is looking  Into a new magnifying machine EARS: Decreased hearing RESPIRATORY: No cough, wheezing, C/O SOB, not using supplemental O2  Today CARDIAC: No chest pain, palpitations. No Bilateral LE edema  today GI: No abdominal pain. No N/V/D or constipation. No heartburn or reflux.   MUSCULOSKELETAL: No joint pain, swelling or stiffness. Back pain present with any activity. . Gait is steady w/ walker. No recent falls.   NEUROLOGIC: No dizziness, fainting, headache. No change in mental status.   PSYCHIATRIC: No anxiety, depression  Physical Exam Filed Vitals:   03/25/13 1205  BP: 140/80  Pulse: 68  Height: 5' 4.5" (1.638 m)  Weight: 160 lb (72.576 kg)  SpO2: 96%   Body mass index is 27.05 kg/(m^2).  GENERAL APPEARANCE: No acute distress, appropriately groomed, normal body habitus. Alert, pleasant, conversant. SKIN: No rash,  HEAD: Normocephalic, atraumatic EYES: Conjunctiva/lids clear.   RESPIRATORY: Breathing is even, unlabored. Lung sounds clear and full  CARDIOVASCULAR: Heart RRR. Grade 3/4 holosystolic murmur (not new)                          EDEMA: No bilateral pedal /ankle edema.   PSYCHIATRIC: Mood and affect appropriate to  situation   ASSESSMENT/PLAN   Anemia, iron deficiency Minimal drop in hemoglobin hematocrit, other indices indicative of iron deficiency. Patient does not tolerate iron supplements. Continue to monitor intervals.  CONGESTIVE HEART FAILURE Significant weight loss 13 pounds since starting diuretic. Patient has no sign of pulmonary or peripheral edema. Will decrease diuretic to 40 mg daily, continue daily weight monitoring  Long term (current) use of anticoagulants INR supratherapeutic today, likely due to addition of pravastatin. Hold warfarin for 2 days recheck INR on January 2.    Follow up: 2 weeks INR    Lab 03/27/2013 INR  Bevin Das T.Billy Turvey, NP-C 03/25/2013

## 2013-03-25 NOTE — Assessment & Plan Note (Signed)
INR supratherapeutic today, likely due to addition of pravastatin. Hold warfarin for 2 days recheck INR on January 2.

## 2013-03-25 NOTE — Assessment & Plan Note (Signed)
Significant weight loss 13 pounds since starting diuretic. Patient has no sign of pulmonary or peripheral edema. Will decrease diuretic to 40 mg daily, continue daily weight monitoring

## 2013-03-27 LAB — BASIC METABOLIC PANEL
BUN: 23 mg/dL — AB (ref 4–21)
GLUCOSE: 104 mg/dL
POTASSIUM: 3.5 mmol/L (ref 3.4–5.3)
Sodium: 141 mmol/L (ref 137–147)

## 2013-03-27 LAB — POCT INR: INR: 2.5 — AB (ref 0.9–1.1)

## 2013-04-01 ENCOUNTER — Encounter: Payer: Self-pay | Admitting: Geriatric Medicine

## 2013-04-06 ENCOUNTER — Ambulatory Visit (INDEPENDENT_AMBULATORY_CARE_PROVIDER_SITE_OTHER): Payer: Medicare Other | Admitting: Pulmonary Disease

## 2013-04-06 ENCOUNTER — Encounter: Payer: Self-pay | Admitting: Pulmonary Disease

## 2013-04-06 VITALS — BP 130/80 | HR 70 | Ht 64.0 in | Wt 163.0 lb

## 2013-04-06 DIAGNOSIS — J449 Chronic obstructive pulmonary disease, unspecified: Secondary | ICD-10-CM

## 2013-04-06 DIAGNOSIS — J9610 Chronic respiratory failure, unspecified whether with hypoxia or hypercapnia: Secondary | ICD-10-CM

## 2013-04-06 NOTE — Patient Instructions (Signed)
Follow up in 6 months 

## 2013-04-06 NOTE — Progress Notes (Signed)
Chief Complaint  Patient presents with  . Shortness of Breath    Breathing is unchanged. Reports DOE, chest tightness and wheezing. Denies coughing at this time.    History of Present Illness: [name] is a 80 y.o. female COPD, nocturnal hypoxia, and recurrent pneumonia.  She is DNR/DNI.  I last saw her in July 2013.  She was recently told she has heart failure.  She has trouble breathing with minimal activity.  She gets occasional cough and sputum.  She uses her oxygen at night, and when she exerts herself during the day.  She has not been using spiriva.  She does not use her nebulizer much >> this helps some when she uses it.  TESTS: PFT 04/09/07 >> FEV1 1.75(107%), FEV1% 58, TLC 5.21(111%), DLCO 41%  Echo 03/23/13 >> mod LVH, EF 55 to 60%, mod AS, mild AR, mod MR, severe LA dilation, mild RA dilation, mod/severe TR, mod PR, PAS 71 mmHg  .She  has a past medical history of Cholelithiases; Hypothyroid; Leg paresthesia; Aortic valve stenosis; Arthritis; Hyperlipidemia; Dementia; Carotid artery occlusion; Renal disorder; Hypertension; COPD (chronic obstructive pulmonary disease); SEIZURE DISORDER (11/05/2006); GERD (11/05/2006); CVA (02/07/2007); Macular degeneration of both eyes; Blind in both eyes; Peripheral neuropathy; Pneumonia; Internal hemorrhoid, bleeding; Malaria; Skin cancer; Chronic lower back pain; Intestinal infection due to Clostridium difficile (09/2011); Benign neoplasm of breast; Gout, unspecified; Anemia; Anxiety; Other dysfunctions of sleep stages or arousal from sleep; Depression; Chronic rhinitis; Abscess of lung(513.0); Diaphragmatic hernia without mention of obstruction or gangrene; Diverticulosis of colon (without mention of hemorrhage); Acute duodenal ulcer with perforation and obstruction (08/2011); Hemorrhage of rectum and anus; Abscess of intestine (09/2011); Other psoriasis; Lumbago; Nonspecific elevation of levels of transaminase or lactic acid dehydrogenase  (LDH); Solitary pulmonary nodule; Transient ischemic attack (TIA), and cerebral infarction without residual deficits(V12.54); Long term (current) use of anticoagulants; Sacroiliitis (08/27/2012); Anemia, iron deficiency (10/16/2011); and Congestive heart failure.  She  has past surgical history that includes Vesicovaginal fistula closure w/ TAH; Tonsillectomy; Breast lumpectomy; Cataract extraction w/ intraocular lens  implant, bilateral; Skin cancer excision; Abdominal hysterectomy; and Hemorrhoid surgery (11/2008).  Current Outpatient Prescriptions on File Prior to Visit  Medication Sig Dispense Refill  . acetaminophen (TYLENOL) 325 MG tablet Take 650 mg by mouth every 6 (six) hours as needed. For pain. Take 2 twice daily for pain      . acyclovir ointment (ZOVIRAX) 5 % Apply 1 application topically every 3 (three) hours as needed. For cold sores      . amoxicillin (AMOXIL) 500 MG capsule Take 2,000 mg by mouth once. Prior to dental procedure      . betamethasone dipropionate (DIPROLENE) 0.05 % cream Apply topically 2 (two) times daily.      . chlorhexidine (PERIDEX) 0.12 % solution Use as directed 15 mLs in the mouth or throat. swish 15 mls for 30 sec then spit, twice daily      . Cholecalciferol (VITAMIN D) 2000 UNITS CAPS Take 1 capsule by mouth daily.      Marland Kitchen Clocortolone Pivalate (CLODERM) 0.1 % cream Apply 1 application topically 2 (two) times daily. Apply to lesion on left cheek      . desonide (DESOWEN) 0.05 % cream Apply to healing areas on face as needed      . donepezil (ARICEPT) 10 MG tablet Take 10 mg by mouth daily.      . Emollient (CERAVE EX) Apply topically. AS DIRECTED      . escitalopram (LEXAPRO) 20 MG  tablet Take 20 mg by mouth daily.      Marland Kitchen estrogens, conjugated, (PREMARIN) 0.3 MG tablet Take 0.3 mg by mouth daily. Take one daily      . fluticasone (FLONASE) 50 MCG/ACT nasal spray Place 2 sprays into the nose daily as needed. For dry nasal passages      . folic acid (FOLVITE) 1  MG tablet Take 1 mg by mouth daily.      . furosemide (LASIX) 40 MG tablet Take 40 mg by mouth daily. Take additional 40mg  for weight gain of 2 lbs in 1 day      . gabapentin (NEURONTIN) 100 MG capsule Take 100 mg by mouth. Take 2 caps at bedtime      . HYDROcodone-acetaminophen (NORCO/VICODIN) 5-325 MG per tablet Take 1 tablet by mouth. Take one every 4 hours as needed for pain      . hydrocortisone-pramoxine (ANALPRAM-HC) 2.5-1 % rectal cream Place 1 application rectally 2 (two) times daily as needed. For hemorrhoids      . hydrOXYzine (ATARAX/VISTARIL) 25 MG tablet Take 2 tablets (50 mg total) by mouth at bedtime. May have additional 25 mg every 8 hours as needed for itching      . ipratropium-albuterol (DUONEB) 0.5-2.5 (3) MG/3ML SOLN Take 3 mLs by nebulization every 6 (six) hours as needed. For wheezing and shortness of breath      . levothyroxine (SYNTHROID, LEVOTHROID) 88 MCG tablet Take 88 mcg by mouth daily before breakfast. Take one tablet daily for thyroid      . Multiple Vitamins-Minerals (PRESERVISION AREDS PO) Take by mouth. Take one twice daily      . olopatadine (PATANOL) 0.1 % ophthalmic solution Place 1 drop into both eyes 2 (two) times daily as needed. For dry eyes      . OVER THE COUNTER MEDICATION Cera Ve 1.3, 6-11 apply to dry skin daily      . polyethylene glycol (MIRALAX / GLYCOLAX) packet Take 17 g by mouth daily.      . potassium chloride (K-DUR) 10 MEQ tablet Take 2 tablets (20 mEq total) by mouth daily.  30 tablet  0  . pravastatin (PRAVACHOL) 40 MG tablet Take 1 tablet (40 mg total) by mouth every evening.  90 tablet  3  . sennosides-docusate sodium (SENOKOT-S) 8.6-50 MG tablet Take 1 tablet by mouth. Take 2 tabs at bedtime, take one as needed      . tiotropium (SPIRIVA) 18 MCG inhalation capsule Place 18 mcg into inhaler and inhale daily.      Marland Kitchen warfarin (COUMADIN) 5 MG tablet Take 1 tablet (5 mg total) by mouth daily. Take one tablet daily or as directed for  anticoagulation       No current facility-administered medications on file prior to visit.    Allergies  Allergen Reactions  . Cortisone Other (See Comments)  . Minocin [Minocycline Hcl] Other (See Comments)    Unknown  . Minocycline Hcl   . Neomycin-Bacitracin Zn-Polymyx   . Neosporin [Neomycin-Bacitracin Zn-Polymyx] Other (See Comments)    Unknown  . Other Other (See Comments)    Steroidal Neuromuscular Blockers  . Tetanus Toxoids Other (See Comments)    Unknown    Physical Exam:  General - using supplemental oxygen ENT - no sinus tenderness Cardiac - s1s2 regular, 3/6 SM, pulses symmetric  Chest - prolonged exhalation, no wheeze/rales  Back - no focal tenderness  Abd - soft, non tender Ext - no edema Neuro - normal strength Skin -  no rashes Psych - normal mood, and behavior.   Assessment/Plan:   Makinzy Cleere Pager:  [phone] 04/06/2013, 11:29 AM

## 2013-04-14 ENCOUNTER — Encounter: Payer: Self-pay | Admitting: Cardiology

## 2013-04-14 LAB — LIPID PANEL
Cholesterol: 256 mg/dL — AB (ref 0–200)
HDL: 48 mg/dL (ref 35–70)
LDL Cholesterol: 152 mg/dL
Triglycerides: 278 mg/dL — AB (ref 40–160)

## 2013-04-14 LAB — HEPATIC FUNCTION PANEL
ALT: 24 U/L (ref 7–35)
AST: 24 U/L (ref 13–35)

## 2013-04-14 NOTE — Assessment & Plan Note (Signed)
She is to continue 2 liters oxygen 24/7. 

## 2013-04-14 NOTE — Assessment & Plan Note (Signed)
She is to continue prn duoneb.

## 2013-04-15 ENCOUNTER — Encounter: Payer: Self-pay | Admitting: Geriatric Medicine

## 2013-04-15 ENCOUNTER — Non-Acute Institutional Stay: Payer: Medicare Other | Admitting: Geriatric Medicine

## 2013-04-15 VITALS — BP 114/68 | HR 72 | Ht 64.0 in | Wt 160.0 lb

## 2013-04-15 DIAGNOSIS — Z7901 Long term (current) use of anticoagulants: Secondary | ICD-10-CM

## 2013-04-15 DIAGNOSIS — M545 Low back pain, unspecified: Secondary | ICD-10-CM

## 2013-04-15 DIAGNOSIS — M533 Sacrococcygeal disorders, not elsewhere classified: Secondary | ICD-10-CM

## 2013-04-15 DIAGNOSIS — I509 Heart failure, unspecified: Secondary | ICD-10-CM

## 2013-04-15 LAB — POCT INR: INR: 1.8 — AB (ref 0.9–1.1)

## 2013-04-15 NOTE — Progress Notes (Signed)
Patient ID: [name], female   DOB: 1919/07/19, 80 y.o.   MRN: [id number]  [facility] [phone])  Code Status: DNR Contact Information   Name Relation Home Work Mobile   [name],[name] [phone]     [place] [phone]        Chief Complaint  Patient presents with  . Medical Managment of Chronic Issues    coag check    HPI: This is a 80 y.o. female resident of [place], Assisted Living section evaluated today for anticoagulation management.  Last visit:   Anemia, iron deficiency Minimal drop in hemoglobin hematocrit, other indices indicative of iron deficiency. Patient does not tolerate iron supplements. Continue to monitor intervals.  CONGESTIVE HEART FAILURE Significant weight loss 13 pounds since starting diuretic. Patient has no sign of pulmonary or peripheral edema. Will decrease diuretic to 40 mg daily, continue daily weight monitoring  Long term (current) use of anticoagulants INR supratherapeutic today, likely due to addition of pravastatin. Hold warfarin for 2 days recheck INR on January 2.  Since last visit INR returned to therapeutic range, warfarin dose was reduced to 4 mg daily. Further changes made in her medications or diet. Review of facility record shows patient's weight has been stable between 158-160 pounds, patient report she still short of breath with any activity. Patient's main complaint today is that she "feels like hell"... back pain is very bad. Tylenol is not effective. She has used p.r.n. hydrocodone on occasion and feels this is more helpful. Expresses concern about using this medication routinely   Allergies  Allergen Reactions  . Cortisone Other (See Comments)  . Minocin [Minocycline Hcl] Other (See Comments)    Unknown  . Minocycline Hcl   . Neomycin-Bacitracin Zn-Polymyx   . Neosporin [Neomycin-Bacitracin Zn-Polymyx] Other (See Comments)    Unknown  . Other Other  (See Comments)    Steroidal Neuromuscular Blockers  . Tetanus Toxoids Other (See Comments)    Unknown   MEDICATION -  Reviewed  DATA REVIEWED  Radiologic Exams:   Cardiovascular Exams  Mysis List: 2006: 2D echo: LV EF 65-75%. Mild AoV stenosis 06/25/2011 2-D echo there is aortic root and valvular sclerosis and/or calcification with moderately restricted leaflet separation consistent with moderate aortic stenosis. There is mild mitral annular calcification Ejection fraction 65-70%   03/23/2013 Transthoracic Echocardiography,  PERFORMING   [facility], [facility]  Study Conclusions - Left ventricle: The cavity size was mildly dilated. Wall   thickness was increased in a pattern of moderate LVH.   Systolic function was normal. The estimated ejection   fraction was in the range of 55% to 60%. - Aortic valve: There was moderate stenosis. Mild regurgitation. - Mitral valve: Calcified annulus. Moderate regurgitation. - Left atrium: The atrium was severely dilated. - Right atrium: The atrium was mildly dilated. - Atrial septum: No defect or patent foramen ovale was identified. - Tricuspid valve: Moderate-severe regurgitation. - Pulmonic valve: Moderate regurgitation. - Pulmonary arteries: PA peak pressure: 56mm Hg (S).   Laboratory Studies  [facility] 12/25/2012 WBC 6.8, hemoglobin 9.2, hematocrit 29.4, platelets 287             TSH 1.22             B12 584  01/29/2013 WBC 7.3, Hgb 7.7, Hct 25.3, MCV 67.6, PLt 31  Fe 13,  Ferritin 8  BNP 853.4  Lab Results- [facility] 02/12/2013  Component Value   WBC 7.3   HGB 11.3*  HCT 36   MCV  70.3   MCH   22.1    PLT 277       GLUCOSE 122   NA 143   K 3.9   CL 107   CREATININE 1.4*   BUN 26*       Lab Results- [facility] 02/26/2013  Component Value   WBC 6.9   HGB 10.9*   HCT 34*   MCV  68   MCH 21.8   PLT 297       GLUCOSE 98   NA 139   K 3.8   CREATININE 1.4*   BUN 22*       BNP 471    Lab Results [facility] 03/27/2013    Component Value   GLUCOSE 104   NA 141   K 3.5   CREATININE 1.29   BUN 23*   Lab Results  Component Value Date   INR 1.8* 04/15/2013   INR 2.5* 03/27/2013   INR 4.4* 03/25/2013    REVIEW OF SYSYTEMS DATA OBTAINED: from patient,  GENERAL: Tires easily, No fevers,  Appetite fair, weight stable.  EYES: Very poor eyesight (macular degeneration), is looking  Into a new magnifying machine EARS: Decreased hearing RESPIRATORY: No cough, wheezing, C/O SOB, not using supplemental O2  Today CARDIAC: No chest pain, palpitations. No Bilateral LE edema  GI: No abdominal pain. No N/V/D or constipation. No heartburn or reflux.   MUSCULOSKELETAL: No joint pain, swelling or stiffness. Back pain present nearly all the time, with without activity  . Gait is slow, steady,painful  w/ walker. No recent falls.   NEUROLOGIC: No dizziness, fainting, headache. No change in mental status.   PSYCHIATRIC: No anxiety, depression  PHYSICAL EXAM  Filed Vitals:   04/15/13 1605  BP: 114/68  Pulse: 72  Height: 5\' 4"  (1.626 m)  Weight: 160 lb (72.576 kg)  SpO2: 92%   Body mass index is 27.45 kg/(m^2).  GENERAL APPEARANCE: No acute distress, appropriately groomed, normal body habitus. Alert, pleasant, conversant. SKIN: No diaphoresis, rash HEAD: Normocephalic, atraumatic EYES: Conjunctiva/lids clear.   RESPIRATORY: Breathing is even, unlabored. Lung sounds clear and full  CARDIOVASCULAR: Heart RRR. Grade 3/4 holosystolic murmur (not new)                          EDEMA: No bilateral pedal /ankle edema.   PSYCHIATRIC: Mood and affect appropriate to situation   ASSESSMENT/PLAN   CONGESTIVE HEART FAILURE Volume status appears stable; no peripheral edema, lung sounds are clear, weight is stable. Continue current dose of diuretic. Most recent electrolytes satisfactory  Back pain, lumbosacral Back pain is interfering more with this patient's quality of life. She reports that physical therapy interventions are  no longer helpful. Tylenol is not helpful either. We discussed use of hydrocodone on a more regular basis. She agrees to take one dose each morning and continue with p.r.n. dosing if pain becomes severe. Will need to monitor for constipation symptoms with use of hydrocodone  Long term (current) use of anticoagulants INR is in therapeutic range for this patient, continue current warfarin dose. Repeat INR in 4 weeks    Follow up:   4 weeks INR      Miki Labuda T.Amber Guthridge, NP-C 04/15/2013

## 2013-04-16 MED ORDER — HYDROCODONE-ACETAMINOPHEN 5-325 MG PO TABS: 1.0000 | ORAL_TABLET | ORAL | Status: DC

## 2013-04-16 NOTE — Assessment & Plan Note (Signed)
INR is in therapeutic range for this patient, continue current warfarin dose. Repeat INR in 4 weeks

## 2013-04-16 NOTE — Assessment & Plan Note (Signed)
Volume status appears stable; no peripheral edema, lung sounds are clear, weight is stable. Continue current dose of diuretic. Most recent electrolytes satisfactory

## 2013-04-16 NOTE — Assessment & Plan Note (Signed)
Back pain is interfering more with this patient's quality of life. She reports that physical therapy interventions are no longer helpful. Tylenol is not helpful either. We discussed use of hydrocodone on a more regular basis. She agrees to take one dose each morning and continue with p.r.n. dosing if pain becomes severe. Will need to monitor for constipation symptoms with use of hydrocodone

## 2013-05-06 ENCOUNTER — Encounter: Payer: Self-pay | Admitting: Geriatric Medicine

## 2013-05-06 ENCOUNTER — Non-Acute Institutional Stay: Payer: Medicare Other | Admitting: Geriatric Medicine

## 2013-05-06 ENCOUNTER — Other Ambulatory Visit: Payer: Self-pay | Admitting: Geriatric Medicine

## 2013-05-06 VITALS — BP 116/70 | HR 68 | Wt 158.0 lb

## 2013-05-06 DIAGNOSIS — M545 Low back pain, unspecified: Secondary | ICD-10-CM

## 2013-05-06 DIAGNOSIS — I509 Heart failure, unspecified: Secondary | ICD-10-CM

## 2013-05-06 DIAGNOSIS — Z7901 Long term (current) use of anticoagulants: Secondary | ICD-10-CM

## 2013-05-06 DIAGNOSIS — M533 Sacrococcygeal disorders, not elsewhere classified: Secondary | ICD-10-CM

## 2013-05-06 LAB — POCT INR: INR: 1.9 — AB (ref 0.9–1.1)

## 2013-05-06 NOTE — Assessment & Plan Note (Signed)
Patient is utilizing hydrocodone nearly daily to help reduce discomfort from this chronic back pain. She's not interested in increasing the dose as the medication makes her "loopy. Continue to monitor effectiveness of this intervention

## 2013-05-06 NOTE — Assessment & Plan Note (Signed)
Symptoms of shortness of breath and edema remain well controlled on current dose of diuretic. The patient does note that she sometimes alters her dosing time due to activity.

## 2013-05-06 NOTE — Assessment & Plan Note (Signed)
INR remains within therapeutic range, continue current warfarin dose. Repeat INR in 4 weeks

## 2013-05-06 NOTE — Progress Notes (Signed)
Patient ID: [name], female   DOB: 1919/12/09, 80 y.o.   MRN: [id number]  [facility] [phone])  Code Status: DNR Contact Information   Name Relation Home Work Mobile   [name],[name] [phone]     [place] [employer] [phone]        Chief Complaint  Patient presents with  . Medical Managment of Chronic Issues    coag check     HPI: This is a 80 y.o. female resident of [place], Assisted Living section evaluated today for anticoagulation management.  Last visit:  CONGESTIVE HEART FAILURE Volume status appears stable; no peripheral edema, lung sounds are clear, weight is stable. Continue current dose of diuretic. Most recent electrolytes satisfactory  Back pain, lumbosacral Back pain is interfering more with this patient's quality of life. She reports that physical therapy interventions are no longer helpful. Tylenol is not helpful either. We discussed use of hydrocodone on a more regular basis. She agrees to take one dose each morning and continue with p.r.n. dosing if pain becomes severe. Will need to monitor for constipation symptoms with use of hydrocodone  Long term (current) use of anticoagulants INR is in therapeutic range for this patient, continue current warfarin dose. Repeat INR in 4 weeks  Since last visit, patient does not have any acute medical issues. She has continued to take warfarin as directed, no new medication or diet diet changes.  Patient has been utilizing hydrocodone about once a day, she's taking in the afternoon, medicine does make her "loopy". For this reason she doesn't take it everyday. Does feel that it helps reduce her chronic back pain Review of facility record shows patient's weight has been stable between 156 157 pounds. She continues to take diuretic daily.    Allergies  Allergen Reactions  . Cortisone Other (See Comments)  . Minocin [Minocycline Hcl] Other (See Comments)   Unknown  . Minocycline Hcl   . Neomycin-Bacitracin Zn-Polymyx   . Neosporin [Neomycin-Bacitracin Zn-Polymyx] Other (See Comments)    Unknown  . Other Other (See Comments)    Steroidal Neuromuscular Blockers  . Tetanus Toxoids Other (See Comments)    Unknown   MEDICATION -  Reviewed  DATA REVIEWED  Radiologic Exams:   Cardiovascular Exams  Mysis List: 2006: 2D echo: LV EF 65-75%. Mild AoV stenosis 06/25/2011 2-D echo there is aortic root and valvular sclerosis and/or calcification with moderately restricted leaflet separation consistent with moderate aortic stenosis. There is mild mitral annular calcification Ejection fraction 65-70%   03/23/2013 Transthoracic Echocardiography,  PERFORMING   [facility], [facility]  Study Conclusions - Left ventricle: The cavity size was mildly dilated. Wall   thickness was increased in a pattern of moderate LVH.   Systolic function was normal. The estimated ejection   fraction was in the range of 55% to 60%. - Aortic valve: There was moderate stenosis. Mild regurgitation. - Mitral valve: Calcified annulus. Moderate regurgitation. - Left atrium: The atrium was severely dilated. - Right atrium: The atrium was mildly dilated. - Atrial septum: No defect or patent foramen ovale was identified. - Tricuspid valve: Moderate-severe regurgitation. - Pulmonic valve: Moderate regurgitation. - Pulmonary arteries: PA peak pressure: 47mm Hg (S).   Laboratory Studies  [facility] 12/25/2012          TSH 1.22             B12 584  01/29/2013 WBC 7.3, Hgb 7.7, Hct 25.3, MCV 67.6, PLt 31  Fe 13,  Ferritin  8  BNP 853.4     Lab Results- [facility] 02/26/2013  Component Value   WBC 6.9   HGB 10.9*   HCT 34*   MCV  68   MCH 21.8   PLT 297       GLUCOSE 98   NA 139   K 3.8   CREATININE 1.4*   BUN 22*       BNP 471    Lab Results [facility] 03/27/2013  Component Value   GLUCOSE 104   NA 141   K 3.5   CREATININE 1.29   BUN 23*   Lab Results  Component  Value Date   INR 1.9* 05/06/2013   INR 1.8* 04/15/2013   INR 2.5* 03/27/2013    REVIEW OF SYSYTEMS DATA OBTAINED: from patient,  GENERAL: Tires easily, No fevers,  Appetite fair, weight stable.  EYES: Very poor eyesight (macular degeneration), is looking  Into a new magnifying machine EARS: Decreased hearing RESPIRATORY: No cough, wheezing, No C/O SOB today, not using supplemental O2  Today CARDIAC: No chest pain, palpitations. No Bilateral LE edema  GI: No abdominal pain. No N/V/D or constipation. No heartburn or reflux.   MUSCULOSKELETAL: No joint pain, swelling or stiffness. Back pain present nearly all the time, with/ without activity  . Gait is slow, steady,painful  w/ walker. No recent falls.   NEUROLOGIC: No dizziness, fainting, headache. No change in mental status.   PSYCHIATRIC: No anxiety, depression  PHYSICAL EXAM  Filed Vitals:   05/06/13 1610  BP: 116/70  Pulse: 68  Weight: 158 lb (71.668 kg)  SpO2: 98%   Body mass index is 27.11 kg/(m^2).  GENERAL APPEARANCE: No acute distress, appropriately groomed, normal body habitus. Alert, pleasant, conversant. SKIN: No diaphoresis, rash HEAD: Normocephalic, atraumatic EYES: Conjunctiva/lids clear.   RESPIRATORY: Breathing is even, unlabored. Lung sounds clear and full  CARDIOVASCULAR: Heart RRR. Grade 3/4 holosystolic murmur (not new)                          EDEMA: No bilateral pedal /ankle edema.   PSYCHIATRIC: Mood and affect appropriate to situation   ASSESSMENT/PLAN   CONGESTIVE HEART FAILURE Symptoms of shortness of breath and edema remain well controlled on current dose of diuretic. The patient does note that she sometimes alters her dosing time due to activity.   Back pain, lumbosacral Patient is utilizing hydrocodone nearly daily to help reduce discomfort from this chronic back pain. She's not interested in increasing the dose as the medication makes her "loopy. Continue to monitor effectiveness of this  intervention  Long term (current) use of anticoagulants INR remains within therapeutic range, continue current warfarin dose. Repeat INR in 4 weeks    Follow up:   Return in about 4 weeks (around 06/03/2013) for INR.     Mardene Celeste, NP-C [place] [phone]  05/06/2013

## 2013-05-27 ENCOUNTER — Ambulatory Visit: Payer: Medicare Other | Admitting: Cardiovascular Disease

## 2013-06-03 ENCOUNTER — Encounter: Payer: Self-pay | Admitting: Geriatric Medicine

## 2013-06-03 ENCOUNTER — Non-Acute Institutional Stay: Payer: Medicare Other | Admitting: Geriatric Medicine

## 2013-06-03 VITALS — BP 124/70 | HR 76 | Wt 157.0 lb

## 2013-06-03 DIAGNOSIS — M533 Sacrococcygeal disorders, not elsewhere classified: Secondary | ICD-10-CM

## 2013-06-03 DIAGNOSIS — I509 Heart failure, unspecified: Secondary | ICD-10-CM

## 2013-06-03 DIAGNOSIS — M545 Low back pain, unspecified: Secondary | ICD-10-CM

## 2013-06-03 DIAGNOSIS — D509 Iron deficiency anemia, unspecified: Secondary | ICD-10-CM

## 2013-06-03 DIAGNOSIS — Z7901 Long term (current) use of anticoagulants: Secondary | ICD-10-CM

## 2013-06-03 LAB — POCT INR: INR: 1.9 — AB (ref 0.9–1.1)

## 2013-06-03 NOTE — Progress Notes (Signed)
Patient ID: [name], female   DOB: 03-19-1920, 80 y.o.   MRN: [id number]  [facility] [phone])  Code Status: DNR Contact Information   Name Relation Home Work Mobile   [name],[name] [phone]     [place] [employer] [phone]        Chief Complaint  Patient presents with  . Medical Managment of Chronic Issues    coag check     HPI: This is a 80 y.o. female resident of [place], Assisted Living section evaluated today for anticoagulation management.  Last visit:  CONGESTIVE HEART FAILURE Symptoms of shortness of breath and edema remain well controlled on current dose of diuretic. The patient does note that she sometimes alters her dosing time due to activity.   Back pain, lumbosacral Patient is utilizing hydrocodone nearly daily to help reduce discomfort from this chronic back pain. She's not interested in increasing the dose as the medication makes her "loopy. Continue to monitor effectiveness of this intervention  Long term (current) use of anticoagulants INR remains within therapeutic range, continue current warfarin dose. Repeat INR in 4 weeks  Since last visit patient reports that her back is "terrible". Tells me she's not getting any relief from the Vicodin. Later in the conversation she tells me she's only been taking half a tablet once daily, doesn't notice any improvement in pain.  Patient patient has been taking warfarin as prescribed, no recent medication changes or new diet.    Allergies  Allergen Reactions  . Cortisone Other (See Comments)  . Minocin [Minocycline Hcl] Other (See Comments)    Unknown  . Minocycline Hcl   . Neomycin-Bacitracin Zn-Polymyx   . Neosporin [Neomycin-Bacitracin Zn-Polymyx] Other (See Comments)    Unknown  . Other Other (See Comments)    Steroidal Neuromuscular Blockers  . Tetanus Toxoids Other (See Comments)    Unknown   MEDICATION -  Reviewed  DATA  REVIEWED  Radiologic Exams:   Cardiovascular Exams  Mysis List: 2006: 2D echo: LV EF 65-75%. Mild AoV stenosis 06/25/2011 2-D echo there is aortic root and valvular sclerosis and/or calcification with moderately restricted leaflet separation consistent with moderate aortic stenosis. There is mild mitral annular calcification Ejection fraction 65-70%   03/23/2013 Transthoracic Echocardiography,  PERFORMING   [facility], [facility]  Study Conclusions - Left ventricle: The cavity size was mildly dilated. Wall   thickness was increased in a pattern of moderate LVH.   Systolic function was normal. The estimated ejection   fraction was in the range of 55% to 60%. - Aortic valve: There was moderate stenosis. Mild regurgitation. - Mitral valve: Calcified annulus. Moderate regurgitation. - Left atrium: The atrium was severely dilated. - Right atrium: The atrium was mildly dilated. - Atrial septum: No defect or patent foramen ovale was identified. - Tricuspid valve: Moderate-severe regurgitation. - Pulmonic valve: Moderate regurgitation. - Pulmonary arteries: PA peak pressure: 32mm Hg (S).   Laboratory Studies  [facility] 12/25/2012          TSH 1.22             B12 584  01/29/2013 WBC 7.3, Hgb 7.7, Hct 25.3, MCV 67.6, PLt 31  Fe 13,  Ferritin 8  BNP 853.4     Lab Results- [facility] 02/26/2013  Component Value   WBC 6.9   HGB 10.9*   HCT 34*   MCV  68   MCH 21.8   PLT 297       GLUCOSE 98  NA 139   K 3.8   CREATININE 1.4*   BUN 22*       BNP 471    Lab Results [facility] 03/27/2013  Component Value   GLUCOSE 104   NA 141   K 3.5   CREATININE 1.29   BUN 23*   Lab Results  Component Value Date   INR 1.9* 06/03/2013   INR 1.9* 05/06/2013   INR 1.8* 04/15/2013    REVIEW OF SYSYTEMS DATA OBTAINED: from patient,  GENERAL: Tires easily, No fevers,  Appetite fair, weight stable.  EYES: Very poor eyesight (macular degeneration), is looking  Into a new magnifying machine EARS:  Decreased hearing RESPIRATORY: No cough, wheezing, No C/O SOB today, not using supplemental O2  Today CARDIAC: No chest pain, palpitations. No Bilateral LE edema  GI: No abdominal pain. No N/V/D or constipation. No heartburn or reflux.   MUSCULOSKELETAL: No joint pain, swelling or stiffness. Back pain present all the time, with/ without activity - see HPI . Gait is slow, steady,painful  w/ walker. No recent falls.   NEUROLOGIC: No dizziness, fainting, headache. No change in mental status.   PSYCHIATRIC: No anxiety, depression  PHYSICAL EXAM  Filed Vitals:   06/03/13 1643  BP: 124/70  Pulse: 76  Weight: 157 lb (71.215 kg)   Body mass index is 26.94 kg/(m^2).  GENERAL APPEARANCE: No acute distress, appropriately groomed, normal body habitus. Alert, pleasant, conversant. SKIN: No diaphoresis, rash HEAD: Normocephalic, atraumatic EYES: Conjunctiva/lids clear.   RESPIRATORY: Breathing is even, unlabored. Lung sounds clear and full  CARDIOVASCULAR: Heart RRR. Grade 3/4 holosystolic murmur (not new)                          EDEMA: No bilateral pedal /ankle edema.   PSYCHIATRIC: Mood and affect appropriate to situation   ASSESSMENT/PLAN   Anemia, iron deficiency Recommend repeat CBC  Long term (current) use of anticoagulants INR remains in therapeutic range, continue current warfarin dose, next INR 4 weeks  Spondylosis of lumbosacral region Patient with persistent lower back pain. No recent fall or other injury to account for increased pain. She's not been utilizing the hydrocodone as prescribed due to concern for sleepiness. Have tried other modalities to reduce patient's chronic back pain. Discussed with her that most likely the side effects of sleepiness with the Vicodin we'll diminish with time. Have again recommended she take one Vicodin each morning, may have an additional one during the day if needed.  CONGESTIVE HEART FAILURE No sign of volume overload with current diuretic  dose. Update lab    Follow up:   Return in about 4 weeks (around 07/01/2013) for INR.  Lab/tests: CBC, BMP     Mardene Celeste, NP-C [place] [phone]  06/03/2013

## 2013-06-03 NOTE — Assessment & Plan Note (Signed)
Recommend repeat CBC

## 2013-06-04 LAB — BASIC METABOLIC PANEL
BUN: 21 mg/dL (ref 4–21)
CREATININE: 1.3 mg/dL — AB (ref 0.5–1.1)
Glucose: 101 mg/dL
POTASSIUM: 3.5 mmol/L (ref 3.4–5.3)
Sodium: 139 mmol/L (ref 137–147)

## 2013-06-04 LAB — CBC AND DIFFERENTIAL
HCT: 36 % (ref 36–46)
Hemoglobin: 11.3 g/dL — AB (ref 12.0–16.0)
PLATELETS: 265 10*3/uL (ref 150–399)
WBC: 6.3 10*3/mL

## 2013-06-05 ENCOUNTER — Encounter: Payer: Self-pay | Admitting: Geriatric Medicine

## 2013-06-05 NOTE — Assessment & Plan Note (Signed)
No sign of volume overload with current diuretic dose. Update lab

## 2013-06-05 NOTE — Assessment & Plan Note (Signed)
Patient with persistent lower back pain. No recent fall or other injury to account for increased pain. She's not been utilizing the hydrocodone as prescribed due to concern for sleepiness. Have tried other modalities to reduce patient's chronic back pain. Discussed with her that most likely the side effects of sleepiness with the Vicodin we'll diminish with time. Have again recommended she take one Vicodin each morning, may have an additional one during the day if needed.

## 2013-06-05 NOTE — Assessment & Plan Note (Signed)
INR remains in therapeutic range, continue current warfarin dose, next INR 4 weeks

## 2013-06-08 ENCOUNTER — Encounter: Payer: Self-pay | Admitting: Cardiology

## 2013-06-08 ENCOUNTER — Ambulatory Visit (INDEPENDENT_AMBULATORY_CARE_PROVIDER_SITE_OTHER): Payer: Medicare Other | Admitting: Cardiology

## 2013-06-08 VITALS — BP 156/88 | HR 69 | Ht 64.0 in | Wt 158.0 lb

## 2013-06-08 DIAGNOSIS — I359 Nonrheumatic aortic valve disorder, unspecified: Secondary | ICD-10-CM

## 2013-06-08 DIAGNOSIS — Z8673 Personal history of transient ischemic attack (TIA), and cerebral infarction without residual deficits: Secondary | ICD-10-CM

## 2013-06-08 DIAGNOSIS — I350 Nonrheumatic aortic (valve) stenosis: Secondary | ICD-10-CM

## 2013-06-08 DIAGNOSIS — I509 Heart failure, unspecified: Secondary | ICD-10-CM

## 2013-06-08 DIAGNOSIS — I10 Essential (primary) hypertension: Secondary | ICD-10-CM

## 2013-06-08 DIAGNOSIS — I6529 Occlusion and stenosis of unspecified carotid artery: Secondary | ICD-10-CM

## 2013-06-08 DIAGNOSIS — E785 Hyperlipidemia, unspecified: Secondary | ICD-10-CM

## 2013-06-08 LAB — BASIC METABOLIC PANEL
BUN: 24 mg/dL — ABNORMAL HIGH (ref 6–23)
CO2: 28 mEq/L (ref 19–32)
Calcium: 9.2 mg/dL (ref 8.4–10.5)
Chloride: 103 mEq/L (ref 96–112)
Creatinine, Ser: 1.4 mg/dL — ABNORMAL HIGH (ref 0.4–1.2)
GFR: 36.2 mL/min — AB (ref >60.00)
GLUCOSE: 74 mg/dL (ref 70–99)
POTASSIUM: 3.3 meq/L — AB (ref 3.5–5.1)
SODIUM: 140 meq/L (ref 135–145)

## 2013-06-08 NOTE — Progress Notes (Signed)
HPI: FU congestive heart failure. Carotid Dopplers in February of 2014 showed an occluded left carotid artery and no significant obstruction on the right. Echocardiogram in December of 2014 showed normal LV function, moderate aortic stenosis, mild aortic insufficiency, moderate mitral regurgitation and biatrial enlargement. There was moderate to severe tricuspid regurgitation and moderate pulmonic insufficiency. I last saw her in December of 2014. Since then, she has some dyspnea on exertion. No orthopnea, PND, pedal edema, chest pain or syncope.  Current Outpatient Prescriptions  Medication Sig Dispense Refill  . acetaminophen (TYLENOL) 325 MG tablet Take 650 mg by mouth every 6 (six) hours as needed. For pain. Take 2 twice daily for pain      . acyclovir ointment (ZOVIRAX) 5 % Apply 1 application topically every 3 (three) hours as needed. For cold sores      . amoxicillin (AMOXIL) 500 MG capsule Take 2,000 mg by mouth once. Prior to dental procedure      . chlorhexidine (PERIDEX) 0.12 % solution Use as directed 15 mLs in the mouth or throat. swish 15 mls for 30 sec then spit, twice daily      . donepezil (ARICEPT) 10 MG tablet Take 10 mg by mouth daily.      Marland Kitchen escitalopram (LEXAPRO) 20 MG tablet Take 20 mg by mouth daily.      . fluticasone (FLONASE) 50 MCG/ACT nasal spray Place 2 sprays into the nose daily as needed. For dry nasal passages      . furosemide (LASIX) 40 MG tablet Take 40 mg by mouth daily. Take additional 40mg  for weight gain of 2 lbs in 1 day      . gabapentin (NEURONTIN) 100 MG capsule Take 100 mg by mouth. Take 2 caps at bedtime      . HYDROcodone-acetaminophen (NORCO/VICODIN) 5-325 MG per tablet Take 1 tablet by mouth as directed. Take one tablet every morning, may have additional tablet every 4 hours as needed for pain  30 tablet    . hydrOXYzine (ATARAX/VISTARIL) 25 MG tablet Take 2 tablets (50 mg total) by mouth at bedtime. May have additional 25 mg every 8 hours as  needed for itching      . ipratropium-albuterol (DUONEB) 0.5-2.5 (3) MG/3ML SOLN Take 3 mLs by nebulization every 6 (six) hours as needed. For wheezing and shortness of breath      . levothyroxine (SYNTHROID, LEVOTHROID) 88 MCG tablet Take 88 mcg by mouth daily before breakfast. Take one tablet daily for thyroid      . Multiple Vitamins-Minerals (PRESERVISION AREDS PO) Take by mouth. Take one twice daily      . olopatadine (PATANOL) 0.1 % ophthalmic solution Place 1 drop into both eyes 2 (two) times daily as needed. For dry eyes      . polyethylene glycol (MIRALAX / GLYCOLAX) packet Take 17 g by mouth daily.      . potassium chloride (K-DUR) 10 MEQ tablet Take 2 tablets (20 mEq total) by mouth daily.  30 tablet  0  . pravastatin (PRAVACHOL) 40 MG tablet Take 1 tablet (40 mg total) by mouth every evening.  90 tablet  3  . Propylene Glycol (SYSTANE BALANCE) 0.6 % SOLN Apply to eye. One drop both eye as needed      . sennosides-docusate sodium (SENOKOT-S) 8.6-50 MG tablet Take 1 tablet by mouth. Take 2 tabs at bedtime, take one as needed      . tiotropium (SPIRIVA) 18 MCG inhalation capsule Place 18 mcg  into inhaler and inhale daily. One daily for SOB      . warfarin (COUMADIN) 4 MG tablet Take 4 mg by mouth daily.      Marland Kitchen warfarin (COUMADIN) 5 MG tablet Take 1 tablet (5 mg total) by mouth daily. Take one tablet daily or as directed for anticoagulation       No current facility-administered medications for this visit.     Past Medical History  Diagnosis Date  . Cholelithiases   . Hypothyroid   . Leg paresthesia   . Aortic valve stenosis   . Arthritis   . Hyperlipidemia   . Dementia   . Carotid artery occlusion   . Renal disorder   . Hypertension   . COPD (chronic obstructive pulmonary disease)   . SEIZURE DISORDER 11/05/2006  . GERD 11/05/2006  . CVA 02/07/2007  . Macular degeneration of both eyes   . Blind in both eyes   . Peripheral neuropathy   . Pneumonia     "many times"  .  Internal hemorrhoid, bleeding   . Malaria     "as a child"  . Skin cancer     "nose, face mostly"  . Chronic lower back pain     "when I go to the bathroom"  . Intestinal infection due to Clostridium difficile 09/2011  . Benign neoplasm of breast   . Gout, unspecified   . Anemia   . Anxiety   . Other dysfunctions of sleep stages or arousal from sleep   . Depression   . Chronic rhinitis   . Abscess of lung(513.0)     RUL  . Diaphragmatic hernia without mention of obstruction or gangrene   . Diverticulosis of colon (without mention of hemorrhage)   . Acute duodenal ulcer with perforation and obstruction 08/2011    perforation  . Hemorrhage of rectum and anus   . Abscess of intestine 09/2011    drainage  . Other psoriasis   . Lumbago   . Nonspecific elevation of levels of transaminase or lactic acid dehydrogenase (LDH)   . Solitary pulmonary nodule     RLL nodule 1 cm  . Transient ischemic attack (TIA), and cerebral infarction without residual deficits(V12.54)   . Long term (current) use of anticoagulants   . Sacroiliitis 08/27/2012  . Anemia, iron deficiency 10/16/2011  . Congestive heart failure   . Spondylosis of lumbosacral region 09/24/2012    L/S xray 08/2012: Mild to moderate osteoarthritis and degenerative spondylosis he diffusely. Anterior subluxation of L4 relative to L5 at 5.7 cm. Mild to moderate levoconvex curvature the mid lumbar spine      Past Surgical History  Procedure Laterality Date  . Vesicovaginal fistula closure w/ tah    . Tonsillectomy    . Breast lumpectomy      bilaterally  . Cataract extraction w/ intraocular lens  implant, bilateral    . Skin cancer excision      "nose and face"  . Abdominal hysterectomy    . Hemorrhoid surgery  11/2008    banding    History   Social History  . Marital Status: Widowed    Spouse Name: N/A    Number of Children: 3  . Years of Education: N/A   Occupational History  .     Social History Main Topics  .  Smoking status: Former Smoker -- 1.00 packs/day for 50 years    Types: Cigarettes    Quit date: 08/08/1993  . Smokeless tobacco: Never Used  .  Alcohol Use: Yes     Comment: 10/15/11 "have a drink when I go out to dinner; don't go out often"  . Drug Use: No  . Sexual Activity: No   Other Topics Concern  . Not on file   Social History Narrative   ** Merged History Encounter **        ROS: no fevers or chills, productive cough, hemoptysis, dysphasia, odynophagia, melena, hematochezia, dysuria, hematuria, rash, seizure activity, orthopnea, PND, pedal edema, claudication. Remaining systems are negative.  Physical Exam: Well-developed well-nourished in no acute distress.  Skin is warm and dry.  HEENT is normal.  Neck is supple.  Chest is clear to auscultation with normal expansion.  Cardiovascular exam is regular rate and rhythm. 2/6 systolic murmur left sternal border Abdominal exam nontender or distended. No masses palpated. Extremities show no edema. neuro grossly intact  ECG sinus rhythm at a rate of 69. Nonspecific ST changes.

## 2013-06-08 NOTE — Assessment & Plan Note (Signed)
Patient apparently has been on Coumadin for years for cerebrovascular disease.

## 2013-06-08 NOTE — Assessment & Plan Note (Signed)
Continue statin. 

## 2013-06-08 NOTE — Assessment & Plan Note (Addendum)
Moderate on most recent echocardiogram. Conservative measures if possible given age.

## 2013-06-08 NOTE — Assessment & Plan Note (Signed)
Continue pravachol. We discussed followup Dopplers. She would never be agreeable carotid endarterectomy. I therefore would not pursue further studies.

## 2013-06-08 NOTE — Assessment & Plan Note (Signed)
Continue present dose of Lasix. Check potassium and renal function. 

## 2013-06-08 NOTE — Assessment & Plan Note (Signed)
Continue present blood pressure medications. 

## 2013-06-08 NOTE — Patient Instructions (Signed)
Your physician wants you to follow-up in: 6 MONTHS WITH DR CRENSHAW You will receive a reminder letter in the mail two months in advance. If you don't receive a letter, please call our office to schedule the follow-up appointment.   Your physician recommends that you HAVE LAB WORK TODAY 

## 2013-06-16 ENCOUNTER — Encounter: Payer: Self-pay | Admitting: Cardiology

## 2013-06-16 LAB — BASIC METABOLIC PANEL
BUN: 24 mg/dL — AB (ref 4–21)
Creatinine: 1.4 mg/dL — AB (ref 0.5–1.1)
GLUCOSE: 106 mg/dL
Potassium: 3.7 mmol/L (ref 3.4–5.3)
SODIUM: 140 mmol/L (ref 137–147)

## 2013-07-01 ENCOUNTER — Non-Acute Institutional Stay: Payer: Medicare Other | Admitting: Geriatric Medicine

## 2013-07-01 ENCOUNTER — Encounter: Payer: Self-pay | Admitting: Geriatric Medicine

## 2013-07-01 VITALS — BP 134/78 | HR 68 | Wt 155.0 lb

## 2013-07-01 DIAGNOSIS — Z7901 Long term (current) use of anticoagulants: Secondary | ICD-10-CM

## 2013-07-01 DIAGNOSIS — Z0000 Encounter for general adult medical examination without abnormal findings: Secondary | ICD-10-CM

## 2013-07-01 DIAGNOSIS — D509 Iron deficiency anemia, unspecified: Secondary | ICD-10-CM

## 2013-07-01 DIAGNOSIS — I6529 Occlusion and stenosis of unspecified carotid artery: Secondary | ICD-10-CM

## 2013-07-01 DIAGNOSIS — I509 Heart failure, unspecified: Secondary | ICD-10-CM

## 2013-07-01 LAB — POCT INR: INR: 1.8 — AB (ref 0.9–1.1)

## 2013-07-01 NOTE — Assessment & Plan Note (Signed)
Patient agreed to undergo pharmacogenetic testing today. Once results are received, will contact patient if medication adjustments are necessary   

## 2013-07-01 NOTE — Progress Notes (Signed)
Patient ID: [name], female   DOB: January 28, 1920, 80 y.o.   MRN: [id number]  [facility] [phone])  Code Status: DNR Contact Information   Name Relation Home Work Mobile   [name],[name] [phone]     [place] [phone]        Chief Complaint  Patient presents with  . Medical Managment of Chronic Issues    coag check     HPI: This is a 80 y.o. female resident of [facility], [facility]isted Living section evaluated today for anticoagulation management.  Last visit:  Anemia, iron deficiency Recommend repeat CBC  Long term (current) use of anticoagulants INR remains in therapeutic range, continue current warfarin dose, next INR 4 weeks  Spondylosis of lumbosacral region Patient with persistent lower back pain. No recent fall or other injury to account for increased pain. She's not been utilizing the hydrocodone as prescribed due to concern for sleepiness. Have tried other modalities to reduce patient's chronic back pain. Discussed with her that most likely the side effects of sleepiness with the Vicodin we'll diminish with time. Have again recommended she take one Vicodin each morning, may have an additional one during the day if needed.  CONGESTIVE HEART FAILURE No sign of volume overload with current diuretic dose. Update lab  Since last visit patient has continued warfarin as directed; no diet changes. Repeat CBC improved.  Patient was evaluated by [facility] (cardiology), potassium dose was adjusted, repeat BMP satisfactory. Back pain continues to be a daily problem, patient cannot tell if hydrocodone is helping. Has tried 2 topical treatments with possible benefit.     Allergies  Allergen Reactions  . Cortisone Other (See Comments)  . Minocin [Minocycline Hcl] Other (See Comments)    Unknown  . Minocycline Hcl   . Neomycin-Bacitracin Zn-Polymyx   . Neosporin [Neomycin-Bacitracin Zn-Polymyx] Other (See  Comments)    Unknown  . Other Other (See Comments)    Steroidal Neuromuscular Blockers  . Tetanus Toxoids Other (See Comments)    Unknown   MEDICATION -  Reviewed  DATA REVIEWED  Radiologic Exams:   Cardiovascular Exams  [facility] List: 2006: 2D echo: LV EF 65-75%. Mild AoV stenosis 06/25/2011 2-D echo there is aortic root and valvular sclerosis and/or calcification with moderately restricted leaflet separation consistent with moderate aortic stenosis. There is mild mitral annular calcification Ejection fraction 65-70%   03/23/2013 Transthoracic Echocardiography,  PERFORMING   [facility]  Study Conclusions - Left ventricle: The cavity size was mildly dilated. Wall   thickness was increased in a pattern of moderate LVH.   Systolic function was normal. The estimated ejection   fraction was in the range of 55% to 60%. - Aortic valve: There was moderate stenosis. Mild regurgitation. - Mitral valve: Calcified annulus. Moderate regurgitation. - Left atrium: The atrium was severely dilated. - Right atrium: The atrium was mildly dilated. - Atrial septum: No defect or patent foramen ovale was identified. - Tricuspid valve: Moderate-severe regurgitation. - Pulmonic valve: Moderate regurgitation. - Pulmonary arteries: PA peak pressure: 27mm Hg (S).   Laboratory Studies  [facility] 12/25/2012          TSH 1.22             B12 584  01/29/2013 WBC 7.3, Hgb 7.7, Hct 25.3, MCV 67.6, PLt 31  Fe 13,  Ferritin 8  BNP 853.4     Lab Results- [facility] 02/26/2013  Component Value   WBC 6.9   HGB 10.9*  HCT 34*   MCV  68   MCH 21.8   PLT 297       GLUCOSE 98   NA 139   K 3.8   CREATININE 1.4*   BUN 22*       BNP 471    Lab Results [facility] 03/27/2013  Component Value   GLUCOSE 104   NA 141   K 3.5   CREATININE 1.29   BUN 23*   Lab Results  Component Value Date   WBC 6.3 06/04/2013   HGB 11.3* 06/04/2013   HCT 36 06/04/2013   MCV 78.5 10/22/2011   PLT 265 06/04/2013   Lab  Results  Component Value Date   NA 140 06/08/2013   K 3.3* 06/08/2013   GLU 101 06/04/2013   BUN 24* 06/08/2013   CREATININE 1.4* 06/08/2013    Lab Results  Component Value Date   NA 140 06/16/2013   K 3.7 06/16/2013   GLU 106 06/16/2013   BUN 24* 06/16/2013   CREATININE 1.4* 06/16/2013      Lab Results  Component Value Date   INR 1.8* 07/01/2013   INR 1.9* 06/03/2013   INR 1.9* 05/06/2013    REVIEW OF SYSYTEMS DATA OBTAINED: from patient,  GENERAL: Tires easily, No fevers,  Appetite fair, weight stable.  EYES: Very poor eyesight (macular degeneration), is looking  Into a new magnifying machine EARS: Decreased hearing RESPIRATORY: No cough, wheezing, No C/O SOB today, not using supplemental O2  Today CARDIAC: No chest pain, palpitations. No Bilateral LE edema  GI: No abdominal pain. No N/V/D or constipation. No heartburn or reflux.   MUSCULOSKELETAL: No joint pain, swelling or stiffness. Back pain present all the time, with/ without activity - see HPI . Gait is slow, steady,painful  w/ walker. No recent falls.   NEUROLOGIC: No dizziness, fainting, headache. No change in mental status.   PSYCHIATRIC: No anxiety, depression  PHYSICAL EXAM  Filed Vitals:   07/01/13 1431  BP: 134/78  Pulse: 68  Weight: 155 lb (70.308 kg)   Body mass index is 26.59 kg/(m^2).  GENERAL APPEARANCE: No acute distress, appropriately groomed, normal body habitus. Alert, pleasant, conversant. SKIN: No diaphoresis, rash HEAD: Normocephalic, atraumatic EYES: Conjunctiva/lids clear.   RESPIRATORY: Breathing is even, unlabored. Lung sounds clear and full  CARDIOVASCULAR: Heart RRR. Grade 3/4 holosystolic murmur (not new)                          EDEMA: No bilateral pedal /ankle edema.   PSYCHIATRIC: Mood and affect appropriate to situation   ASSESSMENT/PLAN   Healthcare maintenance Patient agreed to undergo pharmacogenetic testing today. Once results are received, will contact patient if medication  adjustments are necessary    Long term (current) use of anticoagulants INR remains in therapeutic range, continue current warfarin dose. Next INR 4 weeks  Anemia, iron deficiency Most recent CBC satisfactory, continue to monitor at intervals  CONGESTIVE HEART FAILURE Potassium level but low at last check by Dr. Stanford Breed. He increased potassium supplement, most recent BMP satisfactory    Follow up:   Return in about 4 weeks (around 07/29/2013) for INR.  Lab/tests:     Mardene Celeste, NP-C [place] [phone]  07/01/2013

## 2013-07-01 NOTE — Assessment & Plan Note (Signed)
INR remains in therapeutic range, continue current warfarin dose. Next INR 4 weeks

## 2013-07-01 NOTE — Assessment & Plan Note (Signed)
Most recent CBC satisfactory, continue to monitor at intervals

## 2013-07-01 NOTE — Assessment & Plan Note (Signed)
Potassium level but low at last check by Dr. Stanford Breed. He increased potassium supplement, most recent BMP satisfactory

## 2013-07-04 ENCOUNTER — Emergency Department (HOSPITAL_COMMUNITY)
Admission: EM | Admit: 2013-07-04 | Discharge: 2013-07-04 | Disposition: A | Discharge status: Home / Self Care | Payer: Medicare Other | Attending: Emergency Medicine | Admitting: Emergency Medicine

## 2013-07-04 ENCOUNTER — Encounter (HOSPITAL_COMMUNITY): Payer: Self-pay | Admitting: Emergency Medicine

## 2013-07-04 DIAGNOSIS — Z87448 Personal history of other diseases of urinary system: Secondary | ICD-10-CM | POA: Insufficient documentation

## 2013-07-04 DIAGNOSIS — J3489 Other specified disorders of nose and nasal sinuses: Secondary | ICD-10-CM | POA: Insufficient documentation

## 2013-07-04 DIAGNOSIS — J449 Chronic obstructive pulmonary disease, unspecified: Secondary | ICD-10-CM | POA: Insufficient documentation

## 2013-07-04 DIAGNOSIS — M129 Arthropathy, unspecified: Secondary | ICD-10-CM | POA: Insufficient documentation

## 2013-07-04 DIAGNOSIS — F0390 Unspecified dementia without behavioral disturbance: Secondary | ICD-10-CM | POA: Insufficient documentation

## 2013-07-04 DIAGNOSIS — I509 Heart failure, unspecified: Secondary | ICD-10-CM | POA: Insufficient documentation

## 2013-07-04 DIAGNOSIS — J4489 Other specified chronic obstructive pulmonary disease: Secondary | ICD-10-CM | POA: Insufficient documentation

## 2013-07-04 DIAGNOSIS — E039 Hypothyroidism, unspecified: Secondary | ICD-10-CM | POA: Insufficient documentation

## 2013-07-04 DIAGNOSIS — K5900 Constipation, unspecified: Secondary | ICD-10-CM | POA: Insufficient documentation

## 2013-07-04 DIAGNOSIS — Z8619 Personal history of other infectious and parasitic diseases: Secondary | ICD-10-CM | POA: Insufficient documentation

## 2013-07-04 DIAGNOSIS — E785 Hyperlipidemia, unspecified: Secondary | ICD-10-CM | POA: Insufficient documentation

## 2013-07-04 DIAGNOSIS — Z7901 Long term (current) use of anticoagulants: Secondary | ICD-10-CM | POA: Insufficient documentation

## 2013-07-04 DIAGNOSIS — I10 Essential (primary) hypertension: Secondary | ICD-10-CM | POA: Insufficient documentation

## 2013-07-04 DIAGNOSIS — F329 Major depressive disorder, single episode, unspecified: Secondary | ICD-10-CM | POA: Insufficient documentation

## 2013-07-04 DIAGNOSIS — Z87891 Personal history of nicotine dependence: Secondary | ICD-10-CM | POA: Insufficient documentation

## 2013-07-04 DIAGNOSIS — F411 Generalized anxiety disorder: Secondary | ICD-10-CM | POA: Insufficient documentation

## 2013-07-04 DIAGNOSIS — Z8701 Personal history of pneumonia (recurrent): Secondary | ICD-10-CM | POA: Insufficient documentation

## 2013-07-04 DIAGNOSIS — Z872 Personal history of diseases of the skin and subcutaneous tissue: Secondary | ICD-10-CM | POA: Insufficient documentation

## 2013-07-04 DIAGNOSIS — G40909 Epilepsy, unspecified, not intractable, without status epilepticus: Secondary | ICD-10-CM | POA: Insufficient documentation

## 2013-07-04 DIAGNOSIS — Z79899 Other long term (current) drug therapy: Secondary | ICD-10-CM | POA: Insufficient documentation

## 2013-07-04 DIAGNOSIS — F3289 Other specified depressive episodes: Secondary | ICD-10-CM | POA: Insufficient documentation

## 2013-07-04 DIAGNOSIS — Z8673 Personal history of transient ischemic attack (TIA), and cerebral infarction without residual deficits: Secondary | ICD-10-CM | POA: Insufficient documentation

## 2013-07-04 DIAGNOSIS — Z8742 Personal history of other diseases of the female genital tract: Secondary | ICD-10-CM | POA: Insufficient documentation

## 2013-07-04 DIAGNOSIS — G8929 Other chronic pain: Secondary | ICD-10-CM | POA: Insufficient documentation

## 2013-07-04 DIAGNOSIS — R599 Enlarged lymph nodes, unspecified: Secondary | ICD-10-CM | POA: Insufficient documentation

## 2013-07-04 DIAGNOSIS — Z792 Long term (current) use of antibiotics: Secondary | ICD-10-CM | POA: Insufficient documentation

## 2013-07-04 DIAGNOSIS — Z862 Personal history of diseases of the blood and blood-forming organs and certain disorders involving the immune mechanism: Secondary | ICD-10-CM | POA: Insufficient documentation

## 2013-07-04 DIAGNOSIS — Z8719 Personal history of other diseases of the digestive system: Secondary | ICD-10-CM | POA: Insufficient documentation

## 2013-07-04 DIAGNOSIS — H579 Unspecified disorder of eye and adnexa: Secondary | ICD-10-CM | POA: Insufficient documentation

## 2013-07-04 DIAGNOSIS — R590 Localized enlarged lymph nodes: Secondary | ICD-10-CM

## 2013-07-04 DIAGNOSIS — H543 Unqualified visual loss, both eyes: Secondary | ICD-10-CM | POA: Insufficient documentation

## 2013-07-04 DIAGNOSIS — R197 Diarrhea, unspecified: Secondary | ICD-10-CM | POA: Insufficient documentation

## 2013-07-04 DIAGNOSIS — Z85828 Personal history of other malignant neoplasm of skin: Secondary | ICD-10-CM | POA: Insufficient documentation

## 2013-07-04 DIAGNOSIS — R011 Cardiac murmur, unspecified: Secondary | ICD-10-CM | POA: Insufficient documentation

## 2013-07-04 NOTE — ED Provider Notes (Signed)
CSN: [id number]     Arrival date & time 07/04/13  1938 History   First MD Initiated Contact with Patient 07/04/13 2107     Chief Complaint  Patient presents with  . Lymphadenopathy     (Consider location/radiation/quality/duration/timing/severity/associated sxs/prior Treatment) HPI 80 yo female presents with swollen/tender "gland" on the right side of her neck". Patient states she first noticed it approx 4 days ago. Admits to congestion with itchy watery eyes. States she has bad allergies. Patient pain level is minimal rated about a 4/10 with palpation. States it has improved from earlier. Denies any difficulty swallowing or breathing. Denies any chest pain, SOB, sore throat, ear pain, cough, or fever. Admits to hx of skin cancer "20 years ago".  Past Medical History  Diagnosis Date  . Cholelithiases   . Hypothyroid   . Leg paresthesia   . Aortic valve stenosis   . Arthritis   . Hyperlipidemia   . Dementia   . Carotid artery occlusion   . Renal disorder   . Hypertension   . COPD (chronic obstructive pulmonary disease)   . SEIZURE DISORDER 11/05/2006  . GERD 11/05/2006  . CVA 02/07/2007  . Macular degeneration of both eyes   . Blind in both eyes   . Peripheral neuropathy   . Pneumonia     "many times"  . Internal hemorrhoid, bleeding   . Malaria     "as a child"  . Skin cancer     "nose, face mostly"  . Chronic lower back pain     "when I go to the bathroom"  . Intestinal infection due to Clostridium difficile 09/2011  . Benign neoplasm of breast   . Gout, unspecified   . Anemia   . Anxiety   . Other dysfunctions of sleep stages or arousal from sleep   . Depression   . Chronic rhinitis   . Abscess of lung(513.0)     RUL  . Diaphragmatic hernia without mention of obstruction or gangrene   . Diverticulosis of colon (without mention of hemorrhage)   . Acute duodenal ulcer with perforation and obstruction 08/2011    perforation  . Hemorrhage of rectum and anus   .  Abscess of intestine 09/2011    drainage  . Other psoriasis   . Lumbago   . Nonspecific elevation of levels of transaminase or lactic acid dehydrogenase (LDH)   . Solitary pulmonary nodule     RLL nodule 1 cm  . Transient ischemic attack (TIA), and cerebral infarction without residual deficits(V12.54)   . Long term (current) use of anticoagulants   . Sacroiliitis 08/27/2012  . Anemia, iron deficiency 10/16/2011  . Congestive heart failure   . Spondylosis of lumbosacral region 09/24/2012    L/S xray 08/2012: Mild to moderate osteoarthritis and degenerative spondylosis he diffusely. Anterior subluxation of L4 relative to L5 at 5.7 cm. Mild to moderate levoconvex curvature the mid lumbar spine     Past Surgical History  Procedure Laterality Date  . Vesicovaginal fistula closure w/ tah    . Tonsillectomy    . Breast lumpectomy      bilaterally  . Cataract extraction w/ intraocular lens  implant, bilateral    . Skin cancer excision      "nose and face"  . Abdominal hysterectomy    . Hemorrhoid surgery  11/2008    banding   Family History  Problem Relation Age of Onset  . Hyperlipidemia    . Hypertension    . Stroke    .  Colon cancer Daughter   . Heart attack Father   . Kidney failure Father   . Heart disease Son    History  Substance Use Topics  . Smoking status: Former Smoker -- 1.00 packs/day for 50 years    Types: Cigarettes    Quit date: 08/08/1993  . Smokeless tobacco: Never Used  . Alcohol Use: Yes     Comment: 10/15/11 "have a drink when I go out to dinner; don't go out often"   OB History   Grav Para Term Preterm Abortions TAB SAB Ect Mult Living                 Review of Systems  Gastrointestinal: Positive for diarrhea and constipation. Negative for abdominal pain and rectal pain.  Genitourinary: Negative for dysuria and hematuria.  Neurological: Negative for dizziness.  All other systems reviewed and are negative.     Allergies  Cortisone; Minocin;  Minocycline hcl; Neomycin-bacitracin zn-polymyx; Neosporin; Other; and Tetanus toxoids  Home Medications   Current Outpatient Rx  Name  Route  Sig  Dispense  Refill  . acetaminophen (TYLENOL) 325 MG tablet   Oral   Take 650 mg by mouth every 6 (six) hours as needed. For pain. Take 2 twice daily for pain         . acyclovir ointment (ZOVIRAX) 5 %   Topical   Apply 1 application topically every 3 (three) hours as needed. For cold sores         . amoxicillin (AMOXIL) 500 MG capsule   Oral   Take 2,000 mg by mouth once. Prior to dental procedure         . chlorhexidine (PERIDEX) 0.12 % solution   Mouth/Throat   Use as directed 15 mLs in the mouth or throat. swish 15 mls for 30 sec then spit, twice daily         . desonide (DESOWEN) 0.05 % cream   Topical   Apply 1 application topically as needed (scaling areas on facewhen needed).         . donepezil (ARICEPT) 10 MG tablet   Oral   Take 10 mg by mouth daily.         Marland Kitchen escitalopram (LEXAPRO) 20 MG tablet   Oral   Take 20 mg by mouth daily.         . fluticasone (FLONASE) 50 MCG/ACT nasal spray   Nasal   Place 2 sprays into the nose daily as needed. For dry nasal passages         . furosemide (LASIX) 40 MG tablet   Oral   Take 40 mg by mouth daily. Take additional 40mg  for weight gain of 2 lbs in 1 day         . gabapentin (NEURONTIN) 100 MG capsule   Oral   Take 100 mg by mouth. Take 2 caps at bedtime         . HYDROcodone-acetaminophen (NORCO/VICODIN) 5-325 MG per tablet   Oral   Take 1 tablet by mouth as directed. Take one tablet every morning, may have additional tablet every 4 hours as needed for pain   30 tablet      . hydrOXYzine (ATARAX/VISTARIL) 50 MG tablet   Oral   Take 50 mg by mouth at bedtime.         Marland Kitchen ipratropium-albuterol (DUONEB) 0.5-2.5 (3) MG/3ML SOLN   Nebulization   Take 3 mLs by nebulization every 6 (six) hours as needed. For wheezing  and shortness of breath          . levothyroxine (SYNTHROID, LEVOTHROID) 88 MCG tablet   Oral   Take 88 mcg by mouth daily before breakfast. Take one tablet daily for thyroid         . Multiple Vitamins-Minerals (PRESERVISION AREDS PO)   Oral   Take by mouth. Take one twice daily         . olopatadine (PATANOL) 0.1 % ophthalmic solution   Both Eyes   Place 1 drop into both eyes 2 (two) times daily as needed. For dry eyes         . Polyethyl Glycol-Propyl Glycol (SYSTANE) 0.4-0.3 % SOLN   Ophthalmic   Apply 1 drop to eye as needed (dryness and itching    wait 3-5 min between eye medications).         . polyethylene glycol (MIRALAX / GLYCOLAX) packet   Oral   Take 17 g by mouth daily.         . potassium chloride SA (K-DUR,KLOR-CON) 20 MEQ tablet   Oral   Take 40 mEq by mouth daily.         . pramoxine-mineral oil-zinc (TUCKS) 1-12.5 % rectal ointment   Rectal   Place 1 application rectally every 2 (two) hours as needed for itching (itching).         . pravastatin (PRAVACHOL) 40 MG tablet   Oral   Take 1 tablet (40 mg total) by mouth every evening.   90 tablet   3   . Propylene Glycol (SYSTANE BALANCE) 0.6 % SOLN   Ophthalmic   Apply to eye. One drop both eye as needed         . sennosides-docusate sodium (SENOKOT-S) 8.6-50 MG tablet   Oral   Take 1 tablet by mouth. Take 2 tabs at bedtime, take one as needed         . warfarin (COUMADIN) 4 MG tablet   Oral   Take 4 mg by mouth daily.          BP 180/79  Temp(Src) 97.7 F (36.5 C) (Oral)  Resp 18  Ht 5\' 4"  (1.626 m)  Wt 152 lb (68.947 kg)  BMI 26.08 kg/m2  SpO2 95% Physical Exam  Nursing note and vitals reviewed. Constitutional: She is oriented to person, place, and time. She appears well-developed and well-nourished. No distress.  HENT:  Head: Normocephalic and atraumatic.  Right Ear: Tympanic membrane and ear canal normal. Tympanic membrane is not injected and not erythematous. No middle ear effusion.  Left Ear:  Tympanic membrane and ear canal normal. Tympanic membrane is not injected and not erythematous.  No middle ear effusion.  Nose: Nose normal. Right sinus exhibits no maxillary sinus tenderness and no frontal sinus tenderness. Left sinus exhibits no maxillary sinus tenderness and no frontal sinus tenderness.  Mouth/Throat: Uvula is midline, oropharynx is clear and moist and mucous membranes are normal. No oral lesions. No trismus in the jaw. No oropharyngeal exudate, posterior oropharyngeal edema, posterior oropharyngeal erythema or tonsillar abscesses.  Eyes: Conjunctivae and EOM are normal. Pupils are equal, round, and reactive to light. Right eye exhibits no discharge. Left eye exhibits no discharge. No scleral icterus.  Neck: Normal range of motion and phonation normal. Neck supple. No JVD present. No rigidity. No tracheal deviation, no edema and no erythema present.  Cardiovascular: Normal rate and regular rhythm.  Exam reveals no gallop and no friction rub.   Murmur heard. Pulmonary/Chest: Effort  normal. No stridor. No respiratory distress. She has no wheezes. She has no rhonchi. She has rales in the right lower field.  Abdominal: Normal appearance. She exhibits no distension.  Musculoskeletal: Normal range of motion. She exhibits no edema.  Lymphadenopathy:       Head (right side): Submental (approximately 3cm in diameter. mild tenderness to palpation. ) adenopathy present.    She has no cervical adenopathy.  Neurological: She is alert and oriented to person, place, and time.  Skin: Skin is warm and dry. No rash noted. She is not diaphoretic.  Psychiatric: She has a normal mood and affect. Her behavior is normal.    ED Course  Procedures (including critical care time) Labs Review Labs Reviewed - No data to display Imaging Review No results found.   EKG Interpretation None      MDM   Final diagnoses:  Submandibular lymphadenopathy   Patient afebrile. In NAD Patient is well  appearing.  No evidence of acute infection.  No reports of known cancer.  Discussed possible etiologies with patient. Patient discussed with Dr. Reather Converse, who spoke in detail with patient about her condition.  Likely enlarged lymph node. Recommend follow up with PCP in a couple days if does not improve.      Sherrie George, PA-C 07/05/13 1350

## 2013-07-04 NOTE — ED Notes (Signed)
EDP at the bedside doing [place] and exam

## 2013-07-04 NOTE — ED Notes (Signed)
Patient is alert and oriented x3.  She is complaining of left neck glandular swelling problem that started  4 days ago.  Current she denies pain in the area except for when the area is touched.

## 2013-07-04 NOTE — Discharge Instructions (Signed)
If you were given medicines take as directed.  If you are on coumadin or contraceptives realize their levels and effectiveness is altered by many different medicines.  If you have any reaction (rash, tongues swelling, other) to the medicines stop taking and see a physician.   Please follow up as directed and return to the ER or see a physician for new or worsening symptoms.  Thank you. Filed Vitals:   07/04/13 2033  BP: 180/79  Temp: 97.7 F (36.5 C)  TempSrc: Oral  Resp: 18  Height: 5\' 4"  (1.626 m)  Weight: 152 lb (68.947 kg)  SpO2: 95%    Lymphadenopathy Lymphadenopathy means "disease of the lymph glands." But the term is usually used to describe swollen or enlarged lymph glands, also called lymph nodes. These are the bean-shaped organs found in many locations including the neck, underarm, and groin. Lymph glands are part of the immune system, which fights infections in your body. Lymphadenopathy can occur in just one area of the body, such as the neck, or it can be generalized, with lymph node enlargement in several areas. The nodes found in the neck are the most common sites of lymphadenopathy. CAUSES  When your immune system responds to germs (such as viruses or bacteria ), infection-fighting cells and fluid build up. This causes the glands to grow in size. This is usually not something to worry about. Sometimes, the glands themselves can become infected and inflamed. This is called lymphadenitis. Enlarged lymph nodes can be caused by many diseases:  Bacterial disease, such as strep throat or a skin infection.  Viral disease, such as a common cold.  Other germs, such as lyme disease, tuberculosis, or sexually transmitted diseases.  Cancers, such as lymphoma (cancer of the lymphatic system) or leukemia (cancer of the white blood cells).  Inflammatory diseases such as lupus or rheumatoid arthritis.  Reactions to medications. Many of the diseases above are rare, but important. This  is why you should see your caregiver if you have lymphadenopathy. SYMPTOMS   Swollen, enlarged lumps in the neck, back of the head or other locations.  Tenderness.  Warmth or redness of the skin over the lymph nodes.  Fever. DIAGNOSIS  Enlarged lymph nodes are often near the source of infection. They can help healthcare providers diagnose your illness. For instance:   Swollen lymph nodes around the jaw might be caused by an infection in the mouth.  Enlarged glands in the neck often signal a throat infection.  Lymph nodes that are swollen in more than one area often indicate an illness caused by a virus. Your caregiver most likely will know what is causing your lymphadenopathy after listening to your history and examining you. Blood tests, x-rays or other tests may be needed. If the cause of the enlarged lymph node cannot be found, and it does not go away by itself, then a biopsy may be needed. Your caregiver will discuss this with you. TREATMENT  Treatment for your enlarged lymph nodes will depend on the cause. Many times the nodes will shrink to normal size by themselves, with no treatment. Antibiotics or other medicines may be needed for infection. Only take over-the-counter or prescription medicines for pain, discomfort or fever as directed by your caregiver. HOME CARE INSTRUCTIONS  Swollen lymph glands usually return to normal when the underlying medical condition goes away. If they persist, contact your health-care provider. He/she might prescribe antibiotics or other treatments, depending on the diagnosis. Take any medications exactly as prescribed. Keep  any follow-up appointments made to check on the condition of your enlarged nodes.  SEEK MEDICAL CARE IF:   Swelling lasts for more than two weeks.  You have symptoms such as weight loss, night sweats, fatigue or fever that does not go away.  The lymph nodes are hard, seem fixed to the skin or are growing rapidly.  Skin over the  lymph nodes is red and inflamed. This could mean there is an infection. SEEK IMMEDIATE MEDICAL CARE IF:   Fluid starts leaking from the area of the enlarged lymph node.  You develop a fever of 102 F (38.9 C) or greater.  Severe pain develops (not necessarily at the site of a large lymph node).  You develop chest pain or shortness of breath.  You develop worsening abdominal pain. MAKE SURE YOU:   Understand these instructions.  Will watch your condition.  Will get help right away if you are not doing well or get worse. Document Released: 12/20/2007 Document Revised: 06/04/2011 Document Reviewed: 12/20/2007 [facility] Patient Information 2014 [place].

## 2013-07-05 NOTE — ED Provider Notes (Signed)
Medical screening examination/treatment/procedure(s) were conducted as a shared visit with non-physician practitioner(s) or resident and myself. I personally evaluated the patient during the encounter and agree with the findings and plan unless otherwise indicated.   I have personally reviewed any xrays and/ or EKG's with the provider and I agree with interpretation.  Right submandibular lymphadenopathy for 4 days, no hx of similar. Mild weight loss 15 lbs however pt has been avoiding salt and decreased appetite. No current infection or CA known. Mild tender to palpation, 2 cm submandibular lymphadenopathy, moveable, no rash, neck supple, well appearing, no fever.  Bedside US, no abscess or cellulitis, likely lymph node enlargement.  Discussed fup with pcp.  Submandibular lymphadenopathy EMERGENCY DEPARTMENT US SOFT TISSUE INTERPRETATION "Study: Limited Soft Tissue Ultrasound"  INDICATIONS: neck swelling Multiple views of the body part were obtained in real-time with a multi-frequency linear probe PERFORMED BY:  myself IMAGES ARCHIVED?: yes SIDE:right submandibular BODY PART:above FINDINGS: enlarged node, no abscess INTERPRETATION:  above     Mariea Clonts, MD 07/05/13 1706

## 2013-07-06 ENCOUNTER — Encounter: Payer: Self-pay | Admitting: Geriatric Medicine

## 2013-07-06 ENCOUNTER — Non-Acute Institutional Stay: Payer: Medicare Other | Admitting: Geriatric Medicine

## 2013-07-06 DIAGNOSIS — R599 Enlarged lymph nodes, unspecified: Secondary | ICD-10-CM

## 2013-07-06 DIAGNOSIS — R590 Localized enlarged lymph nodes: Secondary | ICD-10-CM

## 2013-07-06 NOTE — Progress Notes (Signed)
Patient ID: [name], female   DOB: [birth year], 80 y.o.   MRN: [id number]   [place] [phone])   Contact Information   Name Relation Home Work Mobile   [name],[name] [phone]  [phone]   [place] [phone]         Chief Complaint  Patient presents with  . enlarged lymph node    HPI: This is a 80 y.o. female resident of [place]  section.  Evaluated  today in followup of enlarged submental lymph node.   On 07/04/2013 patient c/o to AL nurse pain to right side of neck; swollen and painful for the past four days. Nurse noted swelling noted under jawline. [place] provide recommended evaluation at [facility] ED. Evaluation included physical exam an ultrasound right submandibular area. Impression was enlarged nose, no abscess. Recommended f/u with PCP. Today patient reports no change. Review of facility record shows patient remains afebrile, BP/P stable. No difficulty chewing or swallowing.      Allergies  Allergen Reactions  . Cortisone Other (See Comments)  . Minocin [Minocycline Hcl] Other (See Comments)    Unknown  . Minocycline Hcl   . Neomycin-Bacitracin Zn-Polymyx   . Neosporin [Neomycin-Bacitracin Zn-Polymyx] Other (See Comments)    Unknown  . Other Other (See Comments)    Steroidal Neuromuscular Blockers  . Tetanus Toxoids Other (See Comments)    Unknown    MEDICATIONS -  Reviewed  DATA REVIEWED  Radiologic Exams:  EMERGENCY DEPARTMENT US SOFT TISSUE INTERPRETATION  "Study: Limited Soft Tissue Ultrasound"  INDICATIONS: neck swelling  Multiple views of the body part were obtained in real-time with a multi-frequency linear probe  SIDE:right submandibular  BODY PART:above  FINDINGS: enlarged node, no abscess    Laboratory Studies: Lab Results  Component Value Date   WBC 6.3 06/04/2013   HGB 11.3* 06/04/2013   HCT 36 06/04/2013   MCV 78.5 10/22/2011   PLT 265  06/04/2013   Lab Results  Component Value Date   NA 140 06/16/2013   K 3.7 06/16/2013   GLU 106 06/16/2013   BUN 24* 06/16/2013   CREATININE 1.4* 06/16/2013    Lab Results  Component Value Date   INR 1.8* 07/01/2013   INR 1.9* 06/03/2013   INR 1.9* 05/06/2013     REVIEW OF SYSTEMS  DATA OBTAINED: from patient, medical record GENERAL: Feels "OK"  No recent fever, fatigue, change in activity status, appetite,   RESPIRATORY: No cough, wheezing, SOB CARDIAC: No chest pain, palpitations. No edema GI: No abdominal pain  No Nausea,vomiting,diarrhea or constipation  No heartburn or reflux  MUSCULOSKELETAL: Back pain  Present (chronic)   NEUROLOGIC: No dizziness, fainting, headache,  PSYCHIATRIC: Admits to some anxiety regarding lymph node "is it cancer?"    Sleeps well     PHYSICAL EXAM Filed Vitals:   07/06/13 1104  BP: 166/82  Pulse: 78  Temp: 97.3 F (36.3 C)  Resp: 20  SpO2: 95%   There is no weight on file to calculate BMI.  GENERAL APPEARANCE: No acute distress, appropriately groomed, normal body habitus Alert, pleasant, conversant. SKIN: No diaphoresis, rash, wound HEAD: Normocephalic, atraumatic EYES: Conjunctiva/lids clear  MOUTH/THROAT: Lips w/o lesions. Oral mucosa, tongue moist, w/o lesion. Oropharynx w/o redness or lesions.  NECK: Supple, full ROM. No thyroid tenderness, enlargement or nodule LYMPHATICS: Right submandibular lymph node is palpable, very mild tenderness, moveable. No surrounding edema or erythema RESPIRATORY: Breathing is even, unlabored  Lung sounds are clear and  full  CARDIOVASCULAR: Heart RRR   No murmur or extra heart sounds   EDEMA: No peripheral edema   PSYCHIATRIC: Mood and affect appropriate to situation    ASSESSMENT/PLAN  Submandibular lymphadenopathy Mildly enlarged/tender lymph node. No sign of infection, ED U/S negative for abscess. patient expresses some concern re: possibility this represents cancer, particularly sine she has  experienced weight loss lately. (since starting diuretic therapy) This localized lymphadenopathy is unlikely to be malignant, will observe for any changes. If still present in 3-4 weeks will consider needle biopsy.     Family/ staff Communication:     Labs/tests ordered:    Follow up: Return for As scheduled, INR, As needed.  Mardene Celeste, NP-C [place] [phone]  07/06/2013

## 2013-07-07 DIAGNOSIS — R590 Localized enlarged lymph nodes: Secondary | ICD-10-CM | POA: Insufficient documentation

## 2013-07-07 NOTE — Assessment & Plan Note (Signed)
Mildly enlarged/tender lymph node. No sign of infection, ED U/S negative for abscess. patient expresses some concern re: possibility this represents cancer, particularly sine she has experienced weight loss lately. (since starting diuretic therapy) This localized lymphadenopathy is unlikely to be malignant, will observe for any changes. If still present in 3-4 weeks will consider needle biopsy.

## 2013-07-29 ENCOUNTER — Non-Acute Institutional Stay: Payer: Medicare Other | Admitting: Geriatric Medicine

## 2013-07-29 ENCOUNTER — Encounter: Payer: Self-pay | Admitting: Geriatric Medicine

## 2013-07-29 VITALS — BP 172/90 | HR 72 | Wt 154.0 lb

## 2013-07-29 DIAGNOSIS — M47817 Spondylosis without myelopathy or radiculopathy, lumbosacral region: Secondary | ICD-10-CM

## 2013-07-29 DIAGNOSIS — I6529 Occlusion and stenosis of unspecified carotid artery: Secondary | ICD-10-CM

## 2013-07-29 DIAGNOSIS — Z7901 Long term (current) use of anticoagulants: Secondary | ICD-10-CM

## 2013-07-29 LAB — POCT INR: INR: 1.4 — AB (ref 0.9–1.1)

## 2013-07-29 MED ORDER — WARFARIN SODIUM 5 MG PO TABS: 5.0000 mg | ORAL_TABLET | Freq: Every day | ORAL | Status: DC

## 2013-07-29 MED ORDER — WARFARIN SODIUM 4 MG PO TABS: 4.0000 mg | ORAL_TABLET | Freq: Every day | ORAL | Status: DC

## 2013-07-29 MED ORDER — HYDROCODONE-ACETAMINOPHEN 10-325 MG PO TABS: 1.0000 | ORAL_TABLET | Freq: Every day | ORAL | Status: DC

## 2013-07-29 NOTE — Assessment & Plan Note (Signed)
Back pain does not improve with scheduled dose of hydrocodone. Pharmacogenetic testing indicates patient likely requires higher dose of this medication to be effective. Increase to 10/325mg .

## 2013-07-29 NOTE — Assessment & Plan Note (Signed)
INR 2 low today likely due to use of amoxicillin. Increase dose recheck in 2 weeks

## 2013-07-29 NOTE — Progress Notes (Signed)
Patient ID: [name], female   DOB: 23-Dec-1919, 80 y.o.   MRN: [id number]  [facility] [phone])  Code Status: DNR Contact Information   Name Relation Home Work Mobile   [name] Friend [phone]  [phone]   Rod[place] Comp [phone]        Chief Complaint  Patient presents with  . Medical Management of Chronic Issues    coag check     HPI: This is a 80 y.o. female resident of [facility], [facility]isted [facility] section evaluated today for anticoagulation management.  Last visit:  Healthcare maintenance Patient agreed to undergo pharmacogenetic testing today. Once results are received, will contact patient if medication adjustments are necessary Long term (current) use of anticoagulants INR remains in therapeutic range, continue current warfarin dose. Next INR 4 weeks Anemia, iron deficiency Most recent CBC satisfactory, continue to monitor at intervals CONGESTIVE HEART FAILURE Potassium level but low at last check by Dr. Stanford Breed. He increased potassium supplement, most recent BMP satisfactory  Since last visit no acute medical issues. Patient continues to have great difficulty with itchy skin. She has been to the dermatologist who has made some adjustments in topical and oral medications. The patient has also been to the dentist who prescribed seven days' worth of amoxicillin, 2 days last day. Patient continues to take warfarin as directed Patient's back pain is not improved with scheduled dosing of hydrocodone  Allergies  Allergen Reactions  . Cortisone Other (See Comments)  . Minocin [Minocycline Hcl] Other (See Comments)    Unknown  . Minocycline Hcl   . Neomycin-Bacitracin Zn-Polymyx   . Neosporin [Neomycin-Bacitracin Zn-Polymyx] Other (See Comments)    Unknown  . Other Other (See Comments)    Steroidal Neuromuscular Blockers  . Tetanus Toxoids Other (See Comments)    Unknown   MEDICATION -   Reviewed  DATA REVIEWED  Radiologic Exams:   Cardiovascular Exams  Mysis List: 2006: 2D echo: LV EF 65-75%. Mild AoV stenosis 06/25/2011 2-D echo there is aortic root and valvular sclerosis and/or calcification with moderately restricted leaflet separation consistent with moderate aortic stenosis. There is mild mitral annular calcification Ejection fraction 65-70%   03/23/2013 Transthoracic Echocardiography,  PERFORMING   [facility], [facility]  Study Conclusions - Left ventricle: The cavity size was mildly dilated. Wall   thickness was increased in a pattern of moderate LVH.   Systolic function was normal. The estimated ejection   fraction was in the range of 55% to 60%. - Aortic valve: There was moderate stenosis. Mild regurgitation. - Mitral valve: Calcified annulus. Moderate regurgitation. - Left atrium: The atrium was severely dilated. - Right atrium: The atrium was mildly dilated. - Atrial septum: No defect or patent foramen ovale was identified. - Tricuspid valve: Moderate-severe regurgitation. - Pulmonic valve: Moderate regurgitation. - Pulmonary arteries: PA peak pressure: 68mm Hg (S).   Laboratory Studies  [facility] 12/25/2012          TSH 1.22             B12 584   Lab Results  Component Value Date   WBC 6.3 06/04/2013   HGB 11.3* 06/04/2013   HCT 36 06/04/2013   MCV 78.5 10/22/2011   PLT 265 06/04/2013   Lab Results  Component Value Date   NA 140 06/16/2013   K 3.7 06/16/2013   GLU 106 06/16/2013   BUN 24* 06/16/2013   CREATININE 1.4* 06/16/2013     Lab Results  Component Value Date  INR 1.4* 07/29/2013   INR 1.8* 07/01/2013   INR 1.9* 06/03/2013    REVIEW OF SYSYTEMS DATA OBTAINED: from patient,  GENERAL: Tires easily, No fevers,  Appetite fair, weight stable.  SKIN: Itchy skin "all over" EYES: Very poor eyesight (macular degeneration), is looking  Into a new magnifying machine EARS: Decreased hearing RESPIRATORY: No cough, wheezing, No C/O SOB today, not  using supplemental O2  Today CARDIAC: No chest pain, palpitations. No Bilateral LE edema  GI: No abdominal pain. No N/V/D or constipation. No heartburn or reflux.   MUSCULOSKELETAL: No joint pain, swelling or stiffness. Back pain present all the time, with/ without activity - see HPI . Gait is slow, steady,painful  w/ walker. No recent falls.   NEUROLOGIC: No dizziness, fainting, headache. No change in mental status.   PSYCHIATRIC: No anxiety, depression  PHYSICAL EXAM  Filed Vitals:   07/29/13 1652  BP: 172/90  Pulse: 72  Weight: 154 lb (69.854 kg)  SpO2: 99%   Body mass index is 26.42 kg/(m^2).  GENERAL APPEARANCE: No acute distress, appropriately groomed, normal body habitus. Alert, pleasant, conversant. SKIN: No diaphoresis, rash.  Several scabbed areas at hairline c/w scratching HEAD: Normocephalic, atraumatic EYES: Conjunctiva/lids clear.   RESPIRATORY: Breathing is even, unlabored. Lung sounds clear and full  CARDIOVASCULAR: Heart RRR. Grade 3/4 holosystolic murmur (not new)                          EDEMA: No bilateral pedal /ankle edema.   PSYCHIATRIC: Mood and affect appropriate to situation   ASSESSMENT/PLAN   Long term (current) use of anticoagulants INR 2 low today likely due to use of amoxicillin. Increase dose recheck in 2 weeks  Spondylosis of lumbosacral region Back pain does not improve with scheduled dose of hydrocodone. Pharmacogenetic testing indicates patient likely requires higher dose of this medication to be effective. Increase to 10/325mg .    Follow up:   Return in about 2 weeks (around 08/12/2013) for INR.  Lab/tests:     Mardene Celeste, NP-C [facility] [phone]  07/29/2013

## 2013-08-12 ENCOUNTER — Non-Acute Institutional Stay: Payer: Medicare Other | Admitting: Geriatric Medicine

## 2013-08-12 ENCOUNTER — Encounter: Payer: Self-pay | Admitting: Geriatric Medicine

## 2013-08-12 VITALS — BP 120/82 | HR 60 | Wt 154.0 lb

## 2013-08-12 DIAGNOSIS — I6529 Occlusion and stenosis of unspecified carotid artery: Secondary | ICD-10-CM

## 2013-08-12 DIAGNOSIS — M47817 Spondylosis without myelopathy or radiculopathy, lumbosacral region: Secondary | ICD-10-CM

## 2013-08-12 DIAGNOSIS — Z7901 Long term (current) use of anticoagulants: Secondary | ICD-10-CM

## 2013-08-12 LAB — POCT INR: INR: 1.5 — AB (ref 0.9–1.1)

## 2013-08-12 NOTE — Assessment & Plan Note (Signed)
INR remains below therapeutic range, increase warfarin dose, re-check in 2 weeks

## 2013-08-12 NOTE — Progress Notes (Signed)
Patient ID: [name], female   DOB: [birth year], 80 y.o.   MRN: [id number]  [facility] [phone])  Code Status: DNR Contact Information   Name Relation Home Work Mobile   [name],[name] [phone]  [phone]   [place] Comp [phone]        Chief Complaint  Patient presents with  . Medical Management of Chronic Issues    coag check     HPI: This is a 80 y.o. female resident of [facility], [facility]isted [facility] section evaluated today for anticoagulation management.  Last visit:  Long term (current) use of anticoagulants INR 2 low today likely due to use of amoxicillin. Increase dose recheck in 2 weeks  Spondylosis of lumbosacral region Back pain does not improve with scheduled dose of hydrocodone. Pharmacogenetic testing indicates patient likely requires higher dose of this medication to be effective. Increase to 10/325mg .  Since last visit patient has been taking warfarin as prescribed. Has been to the dermatologist and received topical medications no new oral medications. No diet changes. Patient reports increased dose of hydrocodone has not improved her back pain. She does report feeling sleepy all the time  Allergies  Allergen Reactions  . Cortisone Other (See Comments)  . Minocin [Minocycline Hcl] Other (See Comments)    Unknown  . Minocycline Hcl   . Neomycin-Bacitracin Zn-Polymyx   . Neosporin [Neomycin-Bacitracin Zn-Polymyx] Other (See Comments)    Unknown  . Other Other (See Comments)    Steroidal Neuromuscular Blockers  . Tetanus Toxoids Other (See Comments)    Unknown   MEDICATION -  Reviewed  DATA REVIEWED  Radiologic Exams:   Cardiovascular Exams  Mysis List: 2006: 2D echo: LV EF 65-75%. Mild AoV stenosis 06/25/2011 2-D echo there is aortic root and valvular sclerosis and/or calcification with moderately restricted leaflet separation consistent with moderate aortic stenosis. There is  mild mitral annular calcification Ejection fraction 65-70%   03/23/2013 Transthoracic Echocardiography,  PERFORMING   [facility], [facility]  Study Conclusions - Left ventricle: The cavity size was mildly dilated. Wall   thickness was increased in a pattern of moderate LVH.   Systolic function was normal. The estimated ejection   fraction was in the range of 55% to 60%. - Aortic valve: There was moderate stenosis. Mild regurgitation. - Mitral valve: Calcified annulus. Moderate regurgitation. - Left atrium: The atrium was severely dilated. - Right atrium: The atrium was mildly dilated. - Atrial septum: No defect or patent foramen ovale was identified. - Tricuspid valve: Moderate-severe regurgitation. - Pulmonic valve: Moderate regurgitation. - Pulmonary arteries: PA peak pressure: 62mm Hg (S).   Laboratory Studies  [facility] 12/25/2012          TSH 1.22             B12 584   Lab Results  Component Value Date   WBC 6.3 06/04/2013   HGB 11.3* 06/04/2013   HCT 36 06/04/2013   MCV 78.5 10/22/2011   PLT 265 06/04/2013   Lab Results  Component Value Date   NA 140 06/16/2013   K 3.7 06/16/2013   GLU 106 06/16/2013   BUN 24* 06/16/2013   CREATININE 1.4* 06/16/2013     Lab Results  Component Value Date   INR 1.5* 08/12/2013   INR 1.4* 07/29/2013   INR 1.8* 07/01/2013    REVIEW OF SYSYTEMS DATA OBTAINED: from patient,  GENERAL: Tires easily, No fevers,  Appetite fair, weight stable.  SKIN: Itchy skin "all over" EYES:  Very poor eyesight (macular degeneration), is looking  Into a new magnifying machine EARS: Decreased hearing RESPIRATORY: No cough, wheezing, No C/O SOB today, not using supplemental O2  Today CARDIAC: No chest pain, palpitations. Left foot edema today GI: No abdominal pain. No N/V/D or constipation. No heartburn or reflux.   MUSCULOSKELETAL: No joint pain, swelling or stiffness. Back pain present all the time, with/ without activity - see HPI . Gait is slow, steady,painful   w/ walker. No recent falls.   NEUROLOGIC: No dizziness, fainting, headache. No change in mental status.   PSYCHIATRIC: No anxiety, depression  PHYSICAL EXAM  Filed Vitals:   08/12/13 1610  BP: 120/82  Pulse: 60  Weight: 154 lb (69.854 kg)   Body mass index is 26.42 kg/(m^2).  GENERAL APPEARANCE: No acute distress, appropriately groomed, normal body habitus. Alert, pleasant, conversant. SKIN: No diaphoresis, rash.  Several scabbed areas at hairline c/w scratching HEAD: Normocephalic, atraumatic EYES: Conjunctiva/lids clear.   RESPIRATORY: Breathing is even, unlabored. Lung sounds clear and full  CARDIOVASCULAR: Heart RRR. Grade 3/4 holosystolic murmur (not new)                          EDEMA: No bilateral pedal /ankle edema.   PSYCHIATRIC: Mood and affect appropriate to situation   ASSESSMENT/PLAN   Spondylosis of lumbosacral region Hydrocodone has not been effective in reducing this patient's back pain, d/c. Patient did get some relief initially with PT. Will try Massage Therapy  Long term (current) use of anticoagulants INR remains below therapeutic range, increase warfarin dose, re-check in 2 weeks    Follow up:   Return in about 2 weeks (around 08/26/2013) for INR, back pain.  Lab/tests:     Mardene Celeste, NP-C [place] [phone]  08/12/2013

## 2013-08-12 NOTE — Assessment & Plan Note (Signed)
Hydrocodone has not been effective in reducing this patient's back pain, d/c. Patient did get some relief initially with PT. Will try Massage Therapy

## 2013-08-20 ENCOUNTER — Encounter: Payer: Self-pay | Admitting: Geriatric Medicine

## 2013-08-26 ENCOUNTER — Non-Acute Institutional Stay: Payer: Medicare Other | Admitting: Geriatric Medicine

## 2013-08-26 ENCOUNTER — Encounter: Payer: Self-pay | Admitting: Geriatric Medicine

## 2013-08-26 VITALS — BP 132/62 | HR 76 | Wt 154.0 lb

## 2013-08-26 DIAGNOSIS — L299 Pruritus, unspecified: Secondary | ICD-10-CM

## 2013-08-26 DIAGNOSIS — Z7901 Long term (current) use of anticoagulants: Secondary | ICD-10-CM

## 2013-08-26 DIAGNOSIS — I6529 Occlusion and stenosis of unspecified carotid artery: Secondary | ICD-10-CM

## 2013-08-26 LAB — POCT INR: INR: 1.7 — AB (ref 0.9–1.1)

## 2013-08-26 NOTE — Progress Notes (Signed)
Patient ID: [name], female   DOB: [birth year], 80 y.o.   MRN: [id number]  [facility] [phone])  Code Status: DNR Contact Information   Name Relation Home Work Mobile   [name],[name] [phone]  [phone]   [place] Comp [phone]        Chief Complaint  Patient presents with  . Medical Management of Chronic Issues    coag check     HPI: This is a 80 y.o. female resident of [place], [facility]isted Living section evaluated today for anticoagulation management.  Last visit:  Spondylosis of lumbosacral region Hydrocodone has not been effective in reducing this patient's back pain, d/c. Patient did get some relief initially with PT. Will try Massage Therapy Long term (current) use of anticoagulants INR remains below therapeutic range, increase warfarin dose, re-check in 2 weeks  Since last visit no acute medical issues however patient continues to be troubled with this itchy rash. She saw Dr. Ubaldo Glassing last month has prescribed topical medications as well as hydroxyzine without significant improvement. Patient has been having regular massages which have helped her chronic back pain. Patient is continued take her warfarin as prescribed   Allergies  Allergen Reactions  . Cortisone Other (See Comments)  . Minocin [Minocycline Hcl] Other (See Comments)    Unknown  . Minocycline Hcl   . Neomycin-Bacitracin Zn-Polymyx   . Neosporin [Neomycin-Bacitracin Zn-Polymyx] Other (See Comments)    Unknown  . Other Other (See Comments)    Steroidal Neuromuscular Blockers  . Tetanus Toxoids Other (See Comments)    Unknown   MEDICATION -  Reviewed  DATA REVIEWED  Radiologic Exams:   Cardiovascular Exams  Mysis List: 2006: 2D echo: LV EF 65-75%. Mild AoV stenosis 06/25/2011 2-D echo there is aortic root and valvular sclerosis and/or calcification with moderately restricted leaflet separation consistent with moderate  aortic stenosis. There is mild mitral annular calcification Ejection fraction 65-70%   03/23/2013 Transthoracic Echocardiography,  PERFORMING   [facility]  Study Conclusions - Left ventricle: The cavity size was mildly dilated. Wall   thickness was increased in a pattern of moderate LVH.   Systolic function was normal. The estimated ejection   fraction was in the range of 55% to 60%. - Aortic valve: There was moderate stenosis. Mild regurgitation. - Mitral valve: Calcified annulus. Moderate regurgitation. - Left atrium: The atrium was severely dilated. - Right atrium: The atrium was mildly dilated. - Atrial septum: No defect or patent foramen ovale was identified. - Tricuspid valve: Moderate-severe regurgitation. - Pulmonic valve: Moderate regurgitation. - Pulmonary arteries: PA peak pressure: 17mm Hg (S).   Laboratory Studies  [facility] 12/25/2012          TSH 1.22             B12 584   Lab Results  Component Value Date   WBC 6.3 06/04/2013   HGB 11.3* 06/04/2013   HCT 36 06/04/2013   MCV 78.5 10/22/2011   PLT 265 06/04/2013   Lab Results  Component Value Date   NA 140 06/16/2013   K 3.7 06/16/2013   GLU 106 06/16/2013   BUN 24* 06/16/2013   CREATININE 1.4* 06/16/2013     Lab Results  Component Value Date   INR 1.7* 08/26/2013   INR 1.5* 08/12/2013   INR 1.4* 07/29/2013    REVIEW OF SYSYTEMS DATA OBTAINED: from patient,  GENERAL: Tires easily, No fevers,  Appetite fair, weight stable.  SKIN: Itchy skin "all  over" EYES: Very poor eyesight (macular degeneration), is looking  Into a new magnifying machine EARS: Decreased hearing RESPIRATORY: No cough, wheezing, No C/O SOB today, not using supplemental O2  Today CARDIAC: No chest pain, palpitations.  GI: No abdominal pain. No N/V/D or constipation. No heartburn or reflux.   MUSCULOSKELETAL: No joint pain, swelling or stiffness. Back pain present all the time, with/ without activity -less severe with massage therapy -see  HPI . No recent falls.   NEUROLOGIC: No dizziness, fainting, headache. No change in mental status.   PSYCHIATRIC: No anxiety, depression  PHYSICAL EXAM  Filed Vitals:   08/26/13 1426  BP: 132/62  Pulse: 76  Weight: 154 lb (69.854 kg)  SpO2: 93%   Body mass index is 26.42 kg/(m^2).  GENERAL APPEARANCE: No acute distress, appropriately groomed, normal body habitus. Alert, pleasant, conversant. SKIN: No diaphoresis, rash.  Multiple scabbed areas at hairline c/w scratching, both frearms and rt. Lower leg. Lower back with new lesion; eryhtematous pustule HEAD: Normocephalic, atraumatic EYES: Conjunctiva/lids clear.   RESPIRATORY: Breathing is even, unlabored. Lung sounds clear and full  CARDIOVASCULAR: Heart RRR. Grade 3/4 holosystolic murmur (not new)                          EDEMA: No bilateral pedal /ankle edema.   PSYCHIATRIC: Mood and affect appropriate to situation   ASSESSMENT/PLAN   Long term (current) use of anticoagulants INR just below patient's therapeutic range however we'll not change warfarin dose due to initiation of antibiotic. Next INR in 2 weeks  [place] The patient has been seen by Dr. Rolm Bookbinder for persistent itchy rash and resultant scabbed lesions. Topical interventions have not been very useful so far. Will add short course of antibiotic due to presence of pustular lesions. Patient has return visit with Dr. Ubaldo Glassing on June 18    Follow up:   Return in about 2 weeks (around 09/09/2013) for INR.  Lab/tests:     Mardene Celeste, NP-C [place] [phone]  08/26/2013

## 2013-08-26 NOTE — Assessment & Plan Note (Addendum)
The patient has been seen by Dr. Rolm Bookbinder for persistent itchy rash and resultant scabbed lesions. Topical interventions have not been very useful so far. Will add short course of antibiotic due to presence of pustular lesions. Patient has return visit with Dr. Ubaldo Glassing on June 18

## 2013-08-26 NOTE — Assessment & Plan Note (Signed)
INR just below patient's therapeutic range however we'll not change warfarin dose due to initiation of antibiotic. Next INR in 2 weeks

## 2013-09-04 ENCOUNTER — Telehealth: Payer: Self-pay | Admitting: Pulmonary Disease

## 2013-09-04 NOTE — Telephone Encounter (Signed)
Called and spoke with pt and she stated that she is needing help getting set up with a new primary care doctor.  She stated that the PA that has been covering for Dr. Nyoka Cowden will be leaving and she would rather set up with a new doctor.  Pt requested recs from [facility] on who she should see.  Pt stated that she will see if they will cont to check her coumadin levels at the facility where she lives.  VS please advise. Thanks  Allergies  Allergen Reactions  . Cortisone Other (See Comments)  . Minocin [Minocycline Hcl] Other (See Comments)    Unknown  . Minocycline Hcl   . Neomycin-Bacitracin Zn-Polymyx   . Neosporin [Neomycin-Bacitracin Zn-Polymyx] Other (See Comments)    Unknown  . Other Other (See Comments)    Steroidal Neuromuscular Blockers  . Tetanus Toxoids Other (See Comments)    Unknown    Current Outpatient Prescriptions on File Prior to Visit  Medication Sig Dispense Refill  . acetaminophen (TYLENOL) 325 MG tablet Take 650 mg by mouth every 6 (six) hours as needed. For pain. Take 2 twice daily for pain      . acyclovir ointment (ZOVIRAX) 5 % Apply 1 application topically every 3 (three) hours as needed. For cold sores      . amoxicillin (AMOXIL) 500 MG capsule Take 2,000 mg by mouth once. Prior to dental procedure      . chlorhexidine (PERIDEX) 0.12 % solution Use as directed 15 mLs in the mouth or throat. swish 15 mls for 30 sec then spit, twice daily      . desonide (DESOWEN) 0.05 % cream Apply 1 application topically as needed (scaling areas on facewhen needed).      . donepezil (ARICEPT) 10 MG tablet Take 10 mg by mouth daily.      Marland Kitchen escitalopram (LEXAPRO) 20 MG tablet Take 20 mg by mouth daily.      . fluticasone (FLONASE) 50 MCG/ACT nasal spray Place 2 sprays into the nose daily as needed. For dry nasal passages      . furosemide (LASIX) 40 MG tablet Take 40 mg by mouth daily. Take additional 40mg  for weight gain of 2 lbs in 1 day      . gabapentin (NEURONTIN) 100 MG capsule Take  100 mg by mouth. Take 2 caps at bedtime      . hydrOXYzine (ATARAX/VISTARIL) 50 MG tablet Take 50 mg by mouth at bedtime.      Marland Kitchen ipratropium-albuterol (DUONEB) 0.5-2.5 (3) MG/3ML SOLN Take 3 mLs by nebulization every 6 (six) hours as needed. For wheezing and shortness of breath      . levothyroxine (SYNTHROID, LEVOTHROID) 88 MCG tablet Take 88 mcg by mouth daily before breakfast. Take one tablet daily for thyroid      . Multiple Vitamins-Minerals (PRESERVISION AREDS PO) Take by mouth. Take one twice daily      . olopatadine (PATANOL) 0.1 % ophthalmic solution Place 1 drop into both eyes 2 (two) times daily as needed. For dry eyes      . Polyethyl Glycol-Propyl Glycol (SYSTANE) 0.4-0.3 % SOLN Apply 1 drop to eye as needed (dryness and itching    wait 3-5 min between eye medications).      . polyethylene glycol (MIRALAX / GLYCOLAX) packet Take 17 g by mouth daily.      . potassium chloride SA (K-DUR,KLOR-CON) 20 MEQ tablet Take 40 mEq by mouth daily.      . pramoxine-mineral oil-zinc (TUCKS)  1-12.5 % rectal ointment Place 1 application rectally every 2 (two) hours as needed for itching (itching).      . pravastatin (PRAVACHOL) 40 MG tablet Take 1 tablet (40 mg total) by mouth every evening.  90 tablet  3  . Propylene Glycol (SYSTANE BALANCE) 0.6 % SOLN Apply to eye. One drop both eye as needed      . tiotropium (SPIRIVA) 18 MCG inhalation capsule Place 18 mcg into inhaler and inhale daily.      Marland Kitchen warfarin (COUMADIN) 4 MG tablet Take 4 mg by mouth daily. Take one tablet by mouth, Thursday, Saturday. Or as directed for anticoagulation      . warfarin (COUMADIN) 5 MG tablet Take 5 mg by mouth daily. One tablet Sun, Mon, Tues Wed and Friday       No current facility-administered medications on file prior to visit.

## 2013-09-07 NOTE — Telephone Encounter (Signed)
Pt aware of recs per [facility] Given numbers to a few [facility] locations in [place] to call and request new appts.  Nothing further needed.

## 2013-09-07 NOTE — Telephone Encounter (Signed)
Please inform her that I would recommend any of the primary care providers with [facility].  Alternative would be to have primary care resumed by Dr. Dagmar Hait with [facility] >> she was followed by Dr. Dagmar Hait from 2009 to 2011.

## 2013-09-09 ENCOUNTER — Encounter: Payer: Self-pay | Admitting: Geriatric Medicine

## 2013-09-09 ENCOUNTER — Non-Acute Institutional Stay: Payer: Medicare Other | Admitting: Geriatric Medicine

## 2013-09-09 VITALS — BP 118/80 | HR 70

## 2013-09-09 DIAGNOSIS — L299 Pruritus, unspecified: Secondary | ICD-10-CM

## 2013-09-09 DIAGNOSIS — I6529 Occlusion and stenosis of unspecified carotid artery: Secondary | ICD-10-CM

## 2013-09-09 DIAGNOSIS — Z7901 Long term (current) use of anticoagulants: Secondary | ICD-10-CM

## 2013-09-09 LAB — POCT INR: INR: 2 — AB (ref 0.9–1.1)

## 2013-09-09 NOTE — Assessment & Plan Note (Signed)
INRs back in therapeutic range, continue current warfarin dose, repeat INR in 3 weeks

## 2013-09-09 NOTE — Progress Notes (Signed)
Patient ID: [name], female   DOB: 1919-06-01, 80 y.o.   MRN: [id number]  [facility] [phone])  Code Status: DNR Contact Information   Name Relation Home Work Mobile   [name],[name] [phone]  [phone]   [place] Comp [phone]        Chief Complaint  Patient presents with  . Medical Management of Chronic Issues    coag check     HPI: This is a 80 y.o. female resident of [place], Assisted Living section evaluated today for anticoagulation management.  Last visit:  Long term (current) use of anticoagulants INR just below patient's therapeutic range however we'll not change warfarin dose due to initiation of antibiotic. Next INR in 2 weeks Pruritus The patient has been seen by Dr. Rolm Bookbinder for persistent itchy rash and resultant scabbed lesions. Topical interventions have not been very useful so far. Will add short course of antibiotic due to presence of pustular lesions. Patient has return visit with Dr. Ubaldo Glassing on June 18   Since last visit patient completed a short course of doxycycline with some improvement in her skin lesions. She does report that once the antibiotic was stopped the lesions returned. Patient has been taking warfarin as prescribed, no diet changes or new medications  Allergies  Allergen Reactions  . Cortisone Other (See Comments)  . Minocin [Minocycline Hcl] Other (See Comments)    Unknown  . Minocycline Hcl   . Neomycin-Bacitracin Zn-Polymyx   . Neosporin [Neomycin-Bacitracin Zn-Polymyx] Other (See Comments)    Unknown  . Other Other (See Comments)    Steroidal Neuromuscular Blockers  . Tetanus Toxoids Other (See Comments)    Unknown   MEDICATION -  Reviewed  DATA REVIEWED  Radiologic Exams:   Cardiovascular Exams  Mysis List: 2006: 2D echo: LV EF 65-75%. Mild AoV stenosis 06/25/2011 2-D echo there is aortic root and valvular sclerosis and/or calcification with  moderately restricted leaflet separation consistent with moderate aortic stenosis. There is mild mitral annular calcification Ejection fraction 65-70%   03/23/2013 Transthoracic Echocardiography,  PERFORMING   [facility]  Study Conclusions - Left ventricle: The cavity size was mildly dilated. Wall   thickness was increased in a pattern of moderate LVH.   Systolic function was normal. The estimated ejection   fraction was in the range of 55% to 60%. - Aortic valve: There was moderate stenosis. Mild regurgitation. - Mitral valve: Calcified annulus. Moderate regurgitation. - Left atrium: The atrium was severely dilated. - Right atrium: The atrium was mildly dilated. - Atrial septum: No defect or patent foramen ovale was identified. - Tricuspid valve: Moderate-severe regurgitation. - Pulmonic valve: Moderate regurgitation. - Pulmonary arteries: PA peak pressure: 31mm Hg (S).   Laboratory Studies  [facility] 12/25/2012          TSH 1.22             B12 584   Lab Results  Component Value Date   WBC 6.3 06/04/2013   HGB 11.3* 06/04/2013   HCT 36 06/04/2013   MCV 78.5 10/22/2011   PLT 265 06/04/2013   Lab Results  Component Value Date   NA 140 06/16/2013   K 3.7 06/16/2013   GLU 106 06/16/2013   BUN 24* 06/16/2013   CREATININE 1.4* 06/16/2013    Lab Results  Component Value Date   INR 2.0* 09/09/2013   INR 1.7* 08/26/2013   INR 1.5* 08/12/2013    REVIEW OF SYSYTEMS DATA OBTAINED: from  patient,  GENERAL: Tires easily, No fevers,  Appetite fair, weight stable.  SKIN: Itchy skin "all over" EYES: Very poor eyesight (macular degeneration), is looking  Into a new magnifying machine EARS: Decreased hearing RESPIRATORY: No cough, wheezing, No C/O SOB today, not using supplemental O2  Today CARDIAC: No chest pain, palpitations.  GI: No abdominal pain. No N/V/D or constipation. No heartburn or reflux.   MUSCULOSKELETAL: No joint pain, swelling or stiffness. Back pain present all the  time, with/ without activity -less severe with massage therapy -see HPI . No recent falls.   NEUROLOGIC: No dizziness, fainting, headache. No change in mental status.   PSYCHIATRIC: No anxiety, depression  PHYSICAL EXAM  Filed Vitals:   09/09/13 1524  BP: 118/80  Pulse: 70  SpO2: 98%   There is no weight on file to calculate BMI.  GENERAL APPEARANCE: No acute distress, appropriately groomed, normal body habitus. Alert, pleasant, conversant. SKIN: No diaphoresis, rash.  Multiple scabbed areas at hairline are nearly healed. Rt.lower legs wounds healing as well. There are new , small pustular lesions with surrounding erythema in the same area.  HEAD: Normocephalic, atraumatic EYES: Conjunctiva/lids clear.   RESPIRATORY: Breathing is even, unlabored. Lung sounds clear and full  CARDIOVASCULAR: Heart RRR. Grade 3/4 holosystolic murmur (not new)                          EDEMA: No bilateral pedal /ankle edema.   PSYCHIATRIC: Mood and affect appropriate to situation   ASSESSMENT/PLAN   Long term (current) use of anticoagulants INRs back in therapeutic range, continue current warfarin dose, repeat INR in 3 weeks  Pruritus Doxycycline provided some relief and healing of pustular lesions. Lesions seem to be recurring. Will defer any new interventions as patient is returning to Dr. Ubaldo Glassing tomorrow.     Follow up:   Return in about 3 weeks (around 09/30/2013) for INR.  Lab/tests:     Mardene Celeste, NP-C [place] [phone]  09/09/2013

## 2013-09-09 NOTE — Assessment & Plan Note (Signed)
Doxycycline provided some relief and healing of pustular lesions. Lesions seem to be recurring. Will defer any new interventions as patient is returning to Dr. Ubaldo Glassing tomorrow.

## 2013-09-30 ENCOUNTER — Non-Acute Institutional Stay: Payer: Medicare Other | Admitting: Nurse Practitioner

## 2013-09-30 ENCOUNTER — Encounter: Payer: Self-pay | Admitting: Nurse Practitioner

## 2013-09-30 VITALS — BP 120/78 | HR 71 | Wt 155.0 lb

## 2013-09-30 DIAGNOSIS — Z7901 Long term (current) use of anticoagulants: Secondary | ICD-10-CM

## 2013-09-30 DIAGNOSIS — I6529 Occlusion and stenosis of unspecified carotid artery: Secondary | ICD-10-CM

## 2013-09-30 DIAGNOSIS — L299 Pruritus, unspecified: Secondary | ICD-10-CM

## 2013-09-30 LAB — POCT INR: INR: 1.7 — AB (ref 0.9–1.1)

## 2013-09-30 NOTE — Progress Notes (Signed)
Patient ID: [name], female   DOB: 1920/01/11, 80 y.o.   MRN: [id number]    Nursing Home Location:  [place] of Service: Clinic (12)  PCP: Estill Dooms, MD  Allergies  Allergen Reactions  . Cortisone Other (See Comments)  . Minocin [Minocycline Hcl] Other (See Comments)    Unknown  . Minocycline Hcl   . Neomycin-Bacitracin Zn-Polymyx   . Neosporin [Neomycin-Bacitracin Zn-Polymyx] Other (See Comments)    Unknown  . Other Other (See Comments)    Steroidal Neuromuscular Blockers  . Tetanus Toxoids Other (See Comments)    Unknown    Chief Complaint  Patient presents with  . Medical Management of Chronic Issues    coag check     HPI:  This is a 80 y.o. female resident of [place], Assisted Living section evaluated today for anticoagulation management. Patient has been taking warfarin as prescribed, no diet changes or new medications. No signs of bleeding or adverse effects. Has been doing well since visit.    Review of Systems:  DATA OBTAINED: from patient,  GENERAL: Tires easily, No fevers, Appetite fair, weight stable.  SKIN: has improved, itching much better EYES: Very poor eyesight (macular degeneration), is looking Into a new magnifying machine  EARS: Decreased hearing  RESPIRATORY: No cough, wheezing, No C/O SOB today, not using supplemental O2 Today  CARDIAC: No chest pain, palpitations. GI: No abdominal pain. No N/V/D or constipation. No heartburn or reflux.  MUSCULOSKELETAL: No joint pain, swelling or stiffness. Back pain present all the time, with/ without activity -less severe with massage therapy and this has been stable. No recent falls.  NEUROLOGIC: No dizziness, fainting, headache. No change in mental status.  PSYCHIATRIC: No anxiety, depression   Past Medical History  Diagnosis Date  . Cholelithiases   . Hypothyroid   . Leg paresthesia   . Aortic valve stenosis   . Arthritis   .  Hyperlipidemia   . Dementia   . Carotid artery occlusion   . Renal disorder   . Hypertension   . COPD (chronic obstructive pulmonary disease)   . SEIZURE DISORDER 11/05/2006  . GERD 11/05/2006  . CVA 02/07/2007  . Macular degeneration of both eyes   . Blind in both eyes   . Peripheral neuropathy   . Pneumonia     "many times"  . Internal hemorrhoid, bleeding   . Malaria     "as a child"  . Skin cancer     "nose, face mostly"  . Chronic lower back pain     "when I go to the bathroom"  . Intestinal infection due to Clostridium difficile 09/2011  . Benign neoplasm of breast   . Gout, unspecified   . Anemia   . Anxiety   . Other dysfunctions of sleep stages or arousal from sleep   . Depression   . Chronic rhinitis   . Abscess of lung(513.0)     RUL  . Diaphragmatic hernia without mention of obstruction or gangrene   . Diverticulosis of colon (without mention of hemorrhage)   . Acute duodenal ulcer with perforation and obstruction 08/2011    perforation  . Hemorrhage of rectum and anus   . Abscess of intestine 09/2011    drainage  . Other psoriasis   . Lumbago   . Nonspecific elevation of levels of transaminase or lactic acid dehydrogenase (LDH)   . Solitary pulmonary nodule     RLL nodule 1 cm  .  Transient ischemic attack (TIA), and cerebral infarction without residual deficits(V12.54)   . Long term (current) use of anticoagulants   . Sacroiliitis 08/27/2012  . Anemia, iron deficiency 10/16/2011  . Congestive heart failure   . Spondylosis of lumbosacral region 09/24/2012    L/S xray 08/2012: Mild to moderate osteoarthritis and degenerative spondylosis he diffusely. Anterior subluxation of L4 relative to L5 at 5.7 cm. Mild to moderate levoconvex curvature the mid lumbar spine     Past Surgical History  Procedure Laterality Date  . Vesicovaginal fistula closure w/ tah    . Tonsillectomy    . Breast lumpectomy      bilaterally  . Cataract extraction w/ intraocular lens   implant, bilateral    . Skin cancer excision      "nose and face"  . Abdominal hysterectomy    . Hemorrhoid surgery  11/2008    banding   Social History:   reports that she quit smoking about 20 years ago. Her smoking use included Cigarettes. She has a 50 pack-year smoking history. She has never used smokeless tobacco. She reports that she drinks alcohol. She reports that she does not use illicit drugs.  Family History  Problem Relation Age of Onset  . Hyperlipidemia    . Hypertension    . Stroke    . Colon cancer Daughter   . Heart attack Father   . Kidney failure Father   . Heart disease Son     Medications: Patient's Medications  New Prescriptions   No medications on file  Previous Medications   ACETAMINOPHEN (TYLENOL) 325 MG TABLET    Take 650 mg by mouth every 6 (six) hours as needed. For pain. Take 2 twice daily for pain   ACYCLOVIR OINTMENT (ZOVIRAX) 5 %    Apply 1 application topically every 3 (three) hours as needed. For cold sores   AMOXICILLIN (AMOXIL) 500 MG CAPSULE    Take 2,000 mg by mouth once. Prior to dental procedure   CETIRIZINE (ZYRTEC) 10 MG TABLET    Take 10 mg by mouth daily.   CHLORHEXIDINE (PERIDEX) 0.12 % SOLUTION    Use as directed 15 mLs in the mouth or throat. swish 15 mls for 30 sec then spit, twice daily   DONEPEZIL (ARICEPT) 10 MG TABLET    Take 10 mg by mouth daily.   ESCITALOPRAM (LEXAPRO) 20 MG TABLET    Take 20 mg by mouth daily.   FUROSEMIDE (LASIX) 40 MG TABLET    Take one daily for edema   GABAPENTIN (NEURONTIN) 100 MG CAPSULE    Take 100 mg by mouth. Take 2 caps at bedtime   HYDROXYZINE (ATARAX/VISTARIL) 50 MG TABLET    Take 50 mg by mouth at bedtime.   LEVOTHYROXINE (SYNTHROID, LEVOTHROID) 88 MCG TABLET    Take 88 mcg by mouth daily before breakfast. Take one tablet daily for thyroid   MULTIPLE VITAMINS-MINERALS (PRESERVISION AREDS PO)    Take by mouth. Take one twice daily   OLOPATADINE (PATANOL) 0.1 % OPHTHALMIC SOLUTION    Place 1 drop  into both eyes 2 (two) times daily as needed. For dry eyes   POLYETHYL GLYCOL-PROPYL GLYCOL (SYSTANE) 0.4-0.3 % SOLN    Apply 1 drop to eye as needed (dryness and itching    wait 3-5 min between eye medications).   POLYETHYLENE GLYCOL (MIRALAX / GLYCOLAX) PACKET    Take 17 g by mouth daily.   POTASSIUM CHLORIDE SA (K-DUR,KLOR-CON) 20 MEQ TABLET    Take  40 mEq by mouth daily.   PRAMOXINE-MINERAL OIL-ZINC (TUCKS) 1-12.5 % RECTAL OINTMENT    Place 1 application rectally every 2 (two) hours as needed for itching (itching).   PRAVASTATIN (PRAVACHOL) 40 MG TABLET    Take 1 tablet (40 mg total) by mouth every evening.   PROPYLENE GLYCOL (SYSTANE BALANCE) 0.6 % SOLN    Apply to eye. One drop both eye as needed   SENNA (SENOKOT) 8.6 MG TABLET    Take 1 tablet by mouth. Take one tablet in morning, two at bedtime   TIOTROPIUM (SPIRIVA) 18 MCG INHALATION CAPSULE    Place 18 mcg into inhaler and inhale daily.   WARFARIN (COUMADIN) 4 MG TABLET    Take 4 mg by mouth daily. Take one tablet by mouth, Thursday, Saturday. Or as directed for anticoagulation   WARFARIN (COUMADIN) 5 MG TABLET    Take 5 mg by mouth daily. One tablet Sun, Mon, Tues Wed and Friday  Modified Medications   No medications on file  Discontinued Medications   No medications on file     Physical Exam:  Filed Vitals:   09/30/13 1537  BP: 120/78  Pulse: 71  Weight: 155 lb (70.308 kg)  SpO2: 99%    GENERAL APPEARANCE: No acute distress, appropriately groomed, normal body habitus. Alert, pleasant, conversant.  SKIN: No diaphoresis, rash has improved  HEAD: Normocephalic, atraumatic  EYES: Conjunctiva/lids clear.  RESPIRATORY: Breathing is even, unlabored. Lung sounds clear and full  CARDIOVASCULAR: Heart RRR. Grade 3/4 holosystolic murmur (chronic)  EDEMA: No bilateral pedal /ankle edema.  PSYCHIATRIC: Mood and affect appropriate to situation   Labs reviewed: Basic Metabolic Panel:  Recent Labs  06/04/13 06/08/13 1524  06/16/13  NA 139 140 140  K 3.5 3.3* 3.7  CL  --  103  --   CO2  --  28  --   GLUCOSE  --  74  --   BUN 21 24* 24*  CREATININE 1.3* 1.4* 1.4*  CALCIUM  --  9.2  --    Liver Function Tests:  Recent Labs  04/14/13  AST 24  ALT 24   No results found for this basename: LIPASE, AMYLASE,  in the last 8760 hours No results found for this basename: AMMONIA,  in the last 8760 hours CBC:  Recent Labs  02/12/13 02/26/13 06/04/13  WBC 7.3 6.9 6.3  HGB 11.3* 10.9* 11.3*  HCT 36 34* 36  PLT 277 297 265    Lipid Panel:  Recent Labs  04/14/13  CHOL 256*  HDL 48  LDLCALC 152  TRIG 278*    Lab Results  Component Value Date   INR 1.7* 09/30/2013   INR 2.0* 09/09/2013   INR 1.7* 08/26/2013     Assessment/Plan  1. Long term (current) use of anticoagulants -INR of 1.7, will increase coumadin to 5 mg daily and 4 mg on thursdays  2. Pruritus Has improved  RTC in 2 weeks for INR check

## 2013-10-14 ENCOUNTER — Encounter: Payer: Self-pay | Admitting: Nurse Practitioner

## 2013-10-14 ENCOUNTER — Non-Acute Institutional Stay: Payer: Medicare Other | Admitting: Nurse Practitioner

## 2013-10-14 VITALS — BP 122/78 | HR 87 | Wt 159.0 lb

## 2013-10-14 DIAGNOSIS — I6529 Occlusion and stenosis of unspecified carotid artery: Secondary | ICD-10-CM

## 2013-10-14 DIAGNOSIS — Z7901 Long term (current) use of anticoagulants: Secondary | ICD-10-CM

## 2013-10-14 DIAGNOSIS — I509 Heart failure, unspecified: Secondary | ICD-10-CM

## 2013-10-14 DIAGNOSIS — D509 Iron deficiency anemia, unspecified: Secondary | ICD-10-CM

## 2013-10-14 NOTE — Progress Notes (Signed)
Patient ID: [name], female   DOB: 09/27/1919, 80 y.o.   MRN: [id number]    [facility] [phone])  Code Status: DNR Contact Information   Name Relation Home Work Mobile   [name],[name] [phone]  [phone]   [place] Comp [phone]        Chief Complaint  Patient presents with  . Medical Management of Chronic Issues    coag check .  Feet swelling. Appt for oral surgery 10/15/13 with Dr. Benson Norway foax [phone]    HPI: This is a 80 y.o. female resident of [place], Assisted Living section evaluated today for anticoagulation management. Pt also getting 5 teeth extracted tomorrow.  Staff noted 5 lbs weight gain, reports she is feeling somewhat more short of breath but she is also short of breath so it is hard to judge. Swelling noted in LE but not a lot. Having to eat more iron rich food due to being iron def.  No problems laying flat. No chest pain.  patient has been taking warfarin as prescribed, no diet changes or new medications  Allergies  Allergen Reactions  . Cortisone Other (See Comments)  . Minocin [Minocycline Hcl] Other (See Comments)    Unknown  . Minocycline Hcl   . Neomycin-Bacitracin Zn-Polymyx   . Neosporin [Neomycin-Bacitracin Zn-Polymyx] Other (See Comments)    Unknown  . Other Other (See Comments)    Steroidal Neuromuscular Blockers  . Tetanus Toxoids Other (See Comments)    Unknown   MEDICATION  Current Outpatient Prescriptions on File Prior to Visit  Medication Sig Dispense Refill  . acetaminophen (TYLENOL) 325 MG tablet Take 650 mg by mouth every 6 (six) hours as needed. For pain. Take 2 twice daily for pain      . acyclovir ointment (ZOVIRAX) 5 % Apply 1 application topically every 3 (three) hours as needed. For cold sores      . amoxicillin (AMOXIL) 500 MG capsule Take 2,000 mg by mouth once. Prior to dental procedure      . cetirizine (ZYRTEC) 10 MG tablet Take 10 mg by mouth  daily.      . chlorhexidine (PERIDEX) 0.12 % solution Use as directed 15 mLs in the mouth or throat. swish 15 mls for 30 sec then spit, twice daily      . donepezil (ARICEPT) 10 MG tablet Take 10 mg by mouth daily.      Marland Kitchen escitalopram (LEXAPRO) 20 MG tablet Take 20 mg by mouth daily.      . furosemide (LASIX) 40 MG tablet Take one daily for edema      . gabapentin (NEURONTIN) 100 MG capsule Take 100 mg by mouth. Take 2 caps at bedtime      . hydrOXYzine (ATARAX/VISTARIL) 50 MG tablet Take 50 mg by mouth at bedtime.      Marland Kitchen levothyroxine (SYNTHROID, LEVOTHROID) 88 MCG tablet Take 88 mcg by mouth daily before breakfast. Take one tablet daily for thyroid      . Multiple Vitamins-Minerals (PRESERVISION AREDS PO) Take by mouth. Take one twice daily      . olopatadine (PATANOL) 0.1 % ophthalmic solution Place 1 drop into both eyes 2 (two) times daily as needed. For dry eyes      . Polyethyl Glycol-Propyl Glycol (SYSTANE) 0.4-0.3 % SOLN Apply 1 drop to eye as needed (dryness and itching    wait 3-5 min between eye medications).      . polyethylene glycol (MIRALAX / GLYCOLAX) packet Take 17 g  by mouth daily.      . potassium chloride SA (K-DUR,KLOR-CON) 20 MEQ tablet Take 40 mEq by mouth daily.      . pramoxine-mineral oil-zinc (TUCKS) 1-12.5 % rectal ointment Place 1 application rectally every 2 (two) hours as needed for itching (itching).      . pravastatin (PRAVACHOL) 40 MG tablet Take 1 tablet (40 mg total) by mouth every evening.  90 tablet  3  . Propylene Glycol (SYSTANE BALANCE) 0.6 % SOLN Apply to eye. One drop both eye as needed      . senna (SENOKOT) 8.6 MG tablet Take 1 tablet by mouth. Take one tablet in morning, two at bedtime      . tiotropium (SPIRIVA) 18 MCG inhalation capsule Place 18 mcg into inhaler and inhale daily.      Marland Kitchen warfarin (COUMADIN) 4 MG tablet Take 4 mg by mouth daily. Take one tablet by mouth, Thursday Or as directed for anticoagulation      . warfarin (COUMADIN) 5 MG tablet  Take 5 mg by mouth daily. One tablet Sun, Mon, Tues Wed and Friday, Sat       No current facility-administered medications on file prior to visit.     DATA REVIEWED  Radiologic Exams:   Cardiovascular Exams  Mysis List: 2006: 2D echo: LV EF 65-75%. Mild AoV stenosis 06/25/2011 2-D echo there is aortic root and valvular sclerosis and/or calcification with moderately restricted leaflet separation consistent with moderate aortic stenosis. There is mild mitral annular calcification Ejection fraction 65-70%   03/23/2013 Transthoracic Echocardiography,  PERFORMING   [facility]  Study Conclusions - Left ventricle: The cavity size was mildly dilated. Wall   thickness was increased in a pattern of moderate LVH.   Systolic function was normal. The estimated ejection   fraction was in the range of 55% to 60%. - Aortic valve: There was moderate stenosis. Mild regurgitation. - Mitral valve: Calcified annulus. Moderate regurgitation. - Left atrium: The atrium was severely dilated. - Right atrium: The atrium was mildly dilated. - Atrial septum: No defect or patent foramen ovale was identified. - Tricuspid valve: Moderate-severe regurgitation. - Pulmonic valve: Moderate regurgitation. - Pulmonary arteries: PA peak pressure: 45mm Hg (S).   Laboratory Studies  [facility] 12/25/2012          TSH 1.22             B12 584   Lab Results  Component Value Date   WBC 6.3 06/04/2013   HGB 11.3* 06/04/2013   HCT 36 06/04/2013   MCV 78.5 10/22/2011   PLT 265 06/04/2013   Lab Results  Component Value Date   NA 140 06/16/2013   K 3.7 06/16/2013   GLU 106 06/16/2013   BUN 24* 06/16/2013   CREATININE 1.4* 06/16/2013    Lab Results  Component Value Date   INR 1.7* 09/30/2013   INR 2.0* 09/09/2013   INR 1.7* 08/26/2013    REVIEW OF SYSYTEMS DATA OBTAINED: from patient,  GENERAL: Tires easily, No fevers,  Appetite fair, weight stable.  SKIN: no new lesions, sores or wounds EYES: Very poor  eyesight (macular degeneration), is looking  Into a new magnifying machine EARS: Decreased hearing RESPIRATORY: No cough, wheezing, No C/O SOB today, not using supplemental O2  Today CARDIAC: No chest pain, palpitations. Worsening edema   GI: No abdominal pain. No N/V/D or constipation. No heartburn or reflux.   MUSCULOSKELETAL: No joint pain, swelling or stiffness. No recent falls.  NEUROLOGIC: No dizziness, fainting, headache. No change in mental status.   PSYCHIATRIC: No anxiety, depression  PHYSICAL EXAM  Filed Vitals:   10/14/13 1440  BP: 122/78  Pulse: 87  Weight: 159 lb (72.122 kg)  SpO2: 96%   Body mass index is 27.28 kg/(m^2).  GENERAL APPEARANCE: No acute distress, appropriately groomed, normal body habitus. Alert, pleasant, conversant. SKIN: No diaphoresis, rash.  HEAD: Normocephalic, atraumatic EYES: Conjunctiva/lids clear.   RESPIRATORY: Breathing is even, unlabored. Lung sounds clear and full  CARDIOVASCULAR: Heart RRR. Grade 3/4 holosystolic murmur                           EDEMA: +1  bilateral pedal /ankle edema.  GASTROINTESTINAL: Abdomen is soft, non-tender, not distended w/ normal bowel sounds. No mass, hernias or organomegally GENITOURINARY: Bladder non tender, not distended  MUSCULOSKELETAL: No abnormal joints or musculature NEUROLOGIC: Oriented X3. PSYCHIATRIC: Mood and affect appropriate to situation   ASSESSMENT/PLAN  1. CONGESTIVE HEART FAILURE -worsening edema and shortness of breath, will increase lasix to 40 mg twice daily for 3 days to help with diuresis  -follow up BMP in 1 week  -pt reports she has follow up with cardiologist coming up.   2. Anemia, iron deficiency -was added iron rich foods in diet, scheduled for follow up labs  3. Long term (current) use of anticoagulants -having multiple teeth extractions tomorrow will hold coumadin tonight and tomorrow and then resume previous dose -will have pt follow up in 2 weeks for recheck

## 2013-10-15 ENCOUNTER — Telehealth: Payer: Self-pay | Admitting: Cardiology

## 2013-10-15 NOTE — Telephone Encounter (Signed)
New message          Pt has been experiencing some sob lately / pt saw NP yesterday in the nursing home facility / They started her on lasix and would like her to be seen within a week

## 2013-10-15 NOTE — Telephone Encounter (Signed)
Sheree from [facility] called stating that Ms. [name] is having bilateral edema and increase weight gain and would like for her to come in to be seen. Please call...  thanks

## 2013-10-19 NOTE — Telephone Encounter (Signed)
Follow up scheduled

## 2013-10-21 ENCOUNTER — Ambulatory Visit: Payer: Medicare Other | Admitting: Cardiology

## 2013-10-22 LAB — CBC AND DIFFERENTIAL
HEMATOCRIT: 72 % — AB (ref 36–46)
HEMOGLOBIN: 8.9 g/dL — AB (ref 12.0–16.0)
Platelets: 310 10*3/uL (ref 150–399)
WBC: 6.6 10^3/mL

## 2013-10-26 ENCOUNTER — Encounter: Payer: Self-pay | Admitting: Internal Medicine

## 2013-10-28 ENCOUNTER — Encounter: Payer: Medicare Other | Admitting: Nurse Practitioner

## 2013-10-29 ENCOUNTER — Encounter: Payer: Self-pay | Admitting: Cardiology

## 2013-10-29 ENCOUNTER — Ambulatory Visit (INDEPENDENT_AMBULATORY_CARE_PROVIDER_SITE_OTHER): Payer: Medicare Other | Admitting: Cardiology

## 2013-10-29 VITALS — BP 120/64 | HR 62 | Ht 64.0 in | Wt 157.0 lb

## 2013-10-29 DIAGNOSIS — I6529 Occlusion and stenosis of unspecified carotid artery: Secondary | ICD-10-CM

## 2013-10-29 DIAGNOSIS — I509 Heart failure, unspecified: Secondary | ICD-10-CM

## 2013-10-29 DIAGNOSIS — I5032 Chronic diastolic (congestive) heart failure: Secondary | ICD-10-CM

## 2013-10-29 NOTE — Progress Notes (Signed)
Patient ID: [name], female   DOB: 1919-05-07, 80 y.o.   MRN: [id number]    10/29/2013 [name]   January 11, 1920  [id number]  Primary Physicia Kandice Hams, MD Primary Cardiologist: Dr. Stanford Breed  HPI:  The patient is a 80 year old female, followed by Dr. Stanford Breed. She has a history of diastolic heart failure, for which she takes Lasix. Echocardiogram in December of 2014 showed normal LV function, moderate aortic stenosis, mild aortic insufficiency, moderate mitral regurgitation and biatrial enlargement. There was moderate to severe tricuspid regurgitation and moderate pulmonic insufficiency. She also has known peripheral vascular disease. Carotid Dopplers in February of 2014 showed an occluded left carotid artery and no significant obstruction on the right. She has been fairly asymptomatic with this.  She presents to clinic today for evaluation of recent bilateral lower extremity edema. She was recently evaluated by her primary care physician, Dr. Delfina Redwood, for this. He check a BNP which was elevated at 1071. He instructed her to temporarily increase her Lasix to twice a day for a few days. She was instructed to followup with [place] for further evaluation.   In clinic today, she notes significant improvement in her edema. She is now back to her once daily dose of Lasix. She has COPD and notes dyspnea at baseline but denies any increase shortness of breath. She denies orthopnea and PND.Occasion, she still notes mild lower extremity edema at the end of the day, but usually worse if she has been up on her feet alot. She resides at a assisted-living facility and her meals are prepare for her. She states that the meals are typically low-sodium and she does not add salt to her food. She has been fully compliant with her medications.   Current Outpatient Prescriptions  Medication Sig Dispense Refill  . acetaminophen (TYLENOL) 325 MG tablet Take 650 mg by mouth every 6 (six) hours as needed. For  pain. Take 2 twice daily for pain      . acyclovir ointment (ZOVIRAX) 5 % Apply 1 application topically every 3 (three) hours as needed. For cold sores      . amoxicillin (AMOXIL) 500 MG capsule Take 2,000 mg by mouth once. Prior to dental procedure      . cetirizine (ZYRTEC) 10 MG tablet Take 10 mg by mouth daily.      . chlorhexidine (PERIDEX) 0.12 % solution Use as directed 15 mLs in the mouth or throat. swish 15 mls for 30 sec then spit, twice daily      . donepezil (ARICEPT) 10 MG tablet Take 10 mg by mouth daily.      Marland Kitchen escitalopram (LEXAPRO) 20 MG tablet Take 20 mg by mouth daily.      . furosemide (LASIX) 40 MG tablet Take one daily for edema      . gabapentin (NEURONTIN) 100 MG capsule Take 100 mg by mouth. Take 2 caps at bedtime      . hydrOXYzine (ATARAX/VISTARIL) 50 MG tablet Take 50 mg by mouth at bedtime.      Marland Kitchen levothyroxine (SYNTHROID, LEVOTHROID) 88 MCG tablet Take 88 mcg by mouth daily before breakfast. Take one tablet daily for thyroid      . Multiple Vitamins-Minerals (PRESERVISION AREDS PO) Take by mouth. Take one twice daily      . olopatadine (PATANOL) 0.1 % ophthalmic solution Place 1 drop into both eyes 2 (two) times daily as needed. For dry eyes      . Polyethyl Glycol-Propyl Glycol (SYSTANE) 0.4-0.3 % SOLN Apply 1  drop to eye as needed (dryness and itching    wait 3-5 min between eye medications).      . polyethylene glycol (MIRALAX / GLYCOLAX) packet Take 17 g by mouth daily.      . potassium chloride SA (K-DUR,KLOR-CON) 20 MEQ tablet Take 40 mEq by mouth daily.      . pramoxine-mineral oil-zinc (TUCKS) 1-12.5 % rectal ointment Place 1 application rectally every 2 (two) hours as needed for itching (itching).      . pravastatin (PRAVACHOL) 40 MG tablet Take 1 tablet (40 mg total) by mouth every evening.  90 tablet  3  . Propylene Glycol (SYSTANE BALANCE) 0.6 % SOLN Apply to eye. One drop both eye as needed      . senna (SENOKOT) 8.6 MG tablet Take 1 tablet by mouth. Take  one tablet in morning, two at bedtime      . tiotropium (SPIRIVA) 18 MCG inhalation capsule Place 18 mcg into inhaler and inhale daily.      Marland Kitchen warfarin (COUMADIN) 4 MG tablet Take 4 mg by mouth daily. Take one tablet by mouth, Thursday Or as directed for anticoagulation      . warfarin (COUMADIN) 5 MG tablet Take 5 mg by mouth daily. One tablet Sun, Mon, Tues Wed and Friday, Sat       No current facility-administered medications for this visit.    Allergies  Allergen Reactions  . Cortisone Other (See Comments)  . Minocin [Minocycline Hcl] Other (See Comments)    Unknown  . Minocycline Hcl   . Neomycin-Bacitracin Zn-Polymyx   . Neosporin [Neomycin-Bacitracin Zn-Polymyx] Other (See Comments)    Unknown  . Other Other (See Comments)    Steroidal Neuromuscular Blockers  . Tetanus Toxoids Other (See Comments)    Unknown    History   Social History  . Marital Status: Widowed    Spouse Name: N/A    Number of Children: 3  . Years of Education: N/A   Occupational History  .     Social History Main Topics  . Smoking status: Former Smoker -- 1.00 packs/day for 50 years    Types: Cigarettes    Quit date: 08/08/1993  . Smokeless tobacco: Never Used  . Alcohol Use: Yes     Comment: 10/15/11 "have a drink when I go out to dinner; don't go out often"  . Drug Use: No  . Sexual Activity: No   Other Topics Concern  . Not on file   Social History Narrative   ** Merged History Encounter **         Review of Systems: General: negative for chills, fever, night sweats or weight changes.  Cardiovascular: negative for chest pain, dyspnea on exertion, edema, orthopnea, palpitations, paroxysmal nocturnal dyspnea or shortness of breath Dermatological: negative for rash Respiratory: negative for cough or wheezing Urologic: negative for hematuria Abdominal: negative for nausea, vomiting, diarrhea, bright red blood per rectum, melena, or hematemesis Neurologic: negative for visual changes,  syncope, or dizziness All other systems reviewed and are otherwise negative except as noted above.    Height 5\' 4"  (1.626 m), weight 157 lb (71.215 kg).  General appearance: alert, cooperative and no distress Neck: no carotid bruit and no JVD Lungs: clear to auscultation bilaterally Heart: regular rate and rhythm, S1, S2 normal, no murmur, click, rub or gallop Extremities: no LEE Pulses: 2+ and symmetric Skin: warm and dry Neurologic: Grossly normal  EKG NSR 62 bpm  ASSESSMENT AND PLAN:   1. Diastolic  heart failure: Patient appears euvolemic on physical exam today. Her lower extremity edema resolved after temporary increase in her diuretic. He is now back on her once daily dose of Lasix. I recommended that she continue 40 mg once daily. I instructed her to perform daily weights and to notify our office if she gains more than 3 pounds in a 24-hour period or more than 5 pounds in one week. As recommended that she follow a low-sodium diet.   2. Lower extremity edema: Resolved after temporary increase in her diuretic. I recommended that she notify our office if she notes any weight gain as outlined above and also refrain from foods high in sodium. It was also recommended that she elevate her legs above the level of the heart as needed for swelling.  PLAN  Continue diuretic. Low sodium diet and daily weights. Elevate legs above level of heart for edema as needed. F/U with Dr. Stanford Breed in for 6 months or sooner if needed.   [place], BRITTAINYPA-C 10/29/2013 2:40 PM

## 2013-10-29 NOTE — Patient Instructions (Signed)
Continue current medications.   Elevate legs for 30 minute time increments if you have swelling.   Monitor weights daily.   - call our office if you gain more than 3 lbs in 1 day OR 5 lbs in 1 week.   Your physician recommends that you schedule a follow-up appointment in: March 2016 with Dr. Stanford Breed.   Low-Sodium Eating Plan Sodium raises blood pressure and causes water to be held in the body. Getting less sodium from food will help lower your blood pressure, reduce any swelling, and protect your heart, liver, and kidneys. We get sodium by adding salt (sodium chloride) to food. Most of our sodium comes from canned, boxed, and frozen foods. Restaurant foods, fast foods, and pizza are also very high in sodium. Even if you take medicine to lower your blood pressure or to reduce fluid in your body, getting less sodium from your food is important. WHAT IS MY PLAN? Most people should limit their sodium intake to 2,300 mg a day. Your health care provider recommends that you limit your sodium intake to __________ a day.  WHAT DO I NEED TO KNOW ABOUT THIS EATING PLAN? For the low-sodium eating plan, you will follow these general guidelines:  Choose foods with a % Daily Value for sodium of less than 5% (as listed on the food label).   Use salt-free seasonings or herbs instead of table salt or sea salt.   Check with your health care provider or pharmacist before using salt substitutes.   Eat fresh foods.  Eat more vegetables and fruits.  Limit canned vegetables. If you do use them, rinse them well to decrease the sodium.   Limit cheese to 1 oz (28 g) per day.   Eat lower-sodium products, often labeled as "lower sodium" or "no salt added."  Avoid foods that contain monosodium glutamate (MSG). MSG is sometimes added to [place] food and some canned foods.  Check food labels (Nutrition Facts labels) on foods to learn how much sodium is in one serving.  Eat more home-cooked food and less  restaurant, buffet, and fast food.  When eating at a restaurant, ask that your food be prepared with less salt or none, if possible.  HOW DO I READ FOOD LABELS FOR SODIUM INFORMATION? The Nutrition Facts label lists the amount of sodium in one serving of the food. If you eat more than one serving, you must multiply the listed amount of sodium by the number of servings. Food labels may also identify foods as:  Sodium free--Less than 5 mg in a serving.  Very low sodium--35 mg or less in a serving.  Low sodium--140 mg or less in a serving.  Light in sodium--50% less sodium in a serving. For example, if a food that usually has 300 mg of sodium is changed to become light in sodium, it will have 150 mg of sodium.  Reduced sodium--25% less sodium in a serving. For example, if a food that usually has 400 mg of sodium is changed to reduced sodium, it will have 300 mg of sodium. WHAT FOODS CAN I EAT? Grains Low-sodium cereals, including oats, puffed wheat and rice, and shredded wheat cereals. Low-sodium crackers. Unsalted rice and pasta. Lower-sodium bread.  Vegetables Frozen or fresh vegetables. Low-sodium or reduced-sodium canned vegetables. Low-sodium or reduced-sodium tomato sauce and paste. Low-sodium or reduced-sodium tomato and vegetable juices.  Fruits Fresh, frozen, and canned fruit. Fruit juice.  Meat and Other Protein Products Low-sodium canned tuna and salmon. Fresh or frozen  meat, poultry, seafood, and fish. Lamb. Unsalted nuts. Dried beans, peas, and lentils without added salt. Unsalted canned beans. Homemade soups without salt. Eggs.  Dairy Milk. Soy milk. Ricotta cheese. Low-sodium or reduced-sodium cheeses. Yogurt.  Condiments Fresh and dried herbs and spices. Salt-free seasonings. Onion and garlic powders. Low-sodium varieties of mustard and ketchup. Lemon juice.  Fats and Oils Reduced-sodium salad dressings. Unsalted butter.  Other Unsalted popcorn and pretzels.    The items listed above may not be a complete list of recommended foods or beverages. Contact your dietitian for more options. WHAT FOODS ARE NOT RECOMMENDED? Grains Instant hot cereals. Bread stuffing, pancake, and biscuit mixes. Croutons. Seasoned rice or pasta mixes. Noodle soup cups. Boxed or frozen macaroni and cheese. Self-rising flour. Regular salted crackers. Vegetables Regular canned vegetables. Regular canned tomato sauce and paste. Regular tomato and vegetable juices. Frozen vegetables in sauces. Salted french fries. Olives. [name]. Relishes. Sauerkraut. Salsa. Meat and Other Protein Products Salted, canned, smoked, spiced, or pickled meats, seafood, or fish. Bacon, ham, sausage, hot dogs, corned beef, chipped beef, and packaged luncheon meats. Salt pork. Jerky. Pickled herring. Anchovies, regular canned tuna, and sardines. Salted nuts. Dairy Processed cheese and cheese spreads. Cheese curds. Blue cheese and cottage cheese. Buttermilk.  Condiments Onion and garlic salt, seasoned salt, table salt, and sea salt. Canned and packaged gravies. Worcestershire sauce. Tartar sauce. Barbecue sauce. Teriyaki sauce. Soy sauce, including reduced sodium. Steak sauce. Fish sauce. Oyster sauce. Cocktail sauce. Horseradish. Regular ketchup and mustard. Meat flavorings and tenderizers. Bouillon cubes. Hot sauce. Tabasco sauce. Marinades. Taco seasonings. Relishes. Fats and Oils Regular salad dressings. Salted butter. Margarine. Ghee. Bacon fat.  Other Potato and tortilla chips. Corn chips and puffs. Salted popcorn and pretzels. Canned or dried soups. Pizza. Frozen entrees and pot pies.  The items listed above may not be a complete list of foods and beverages to avoid. Contact your dietitian for more information. Document Released: 09/01/2001 Document Revised: 03/17/2013 Document Reviewed: 01/14/2013 [facility] Patient Information 2015 [place], [place]. This information is not intended to replace  advice given to you by your health care provider. Make sure you discuss any questions you have with your health care provider.

## 2013-11-02 ENCOUNTER — Encounter: Payer: Self-pay | Admitting: Pulmonary Disease

## 2013-11-02 ENCOUNTER — Ambulatory Visit (INDEPENDENT_AMBULATORY_CARE_PROVIDER_SITE_OTHER): Payer: Medicare Other | Admitting: Pulmonary Disease

## 2013-11-02 VITALS — BP 116/70 | HR 55 | Temp 98.0°F | Ht 64.0 in | Wt 157.0 lb

## 2013-11-02 DIAGNOSIS — J438 Other emphysema: Secondary | ICD-10-CM

## 2013-11-02 DIAGNOSIS — J9611 Chronic respiratory failure with hypoxia: Secondary | ICD-10-CM

## 2013-11-02 DIAGNOSIS — J9610 Chronic respiratory failure, unspecified whether with hypoxia or hypercapnia: Secondary | ICD-10-CM

## 2013-11-02 DIAGNOSIS — J439 Emphysema, unspecified: Secondary | ICD-10-CM

## 2013-11-02 DIAGNOSIS — I6529 Occlusion and stenosis of unspecified carotid artery: Secondary | ICD-10-CM

## 2013-11-02 DIAGNOSIS — R0902 Hypoxemia: Secondary | ICD-10-CM

## 2013-11-02 DIAGNOSIS — J4489 Other specified chronic obstructive pulmonary disease: Secondary | ICD-10-CM

## 2013-11-02 DIAGNOSIS — J449 Chronic obstructive pulmonary disease, unspecified: Secondary | ICD-10-CM

## 2013-11-02 NOTE — Patient Instructions (Signed)
Will arrange for more portable oxygen set up Follow up in 6 months

## 2013-11-02 NOTE — Assessment & Plan Note (Signed)
Continue prn albuterol.  She did not feel like spiriva was helping.

## 2013-11-02 NOTE — Assessment & Plan Note (Signed)
Will try to arrange for more portable oxygen set up.

## 2013-11-02 NOTE — Progress Notes (Signed)
Chief Complaint  Patient presents with  . Follow-up    Pt states that breathing has worsened since last OV--with exertion. Pt reports being diagnosed with CHF. Pt c/o leg edema    History of Present Illness: Davon Folta is a 80 y.o. female COPD, nocturnal hypoxia, and recurrent pneumonia.  She is DNR/DNI.  She was told she has heart failure.  She has been getting leg swelling.  She gets cough with sputum.  She has trouble seeing, and not sure if she is coughing up blood.  She does not have wheeze.  She is not very active.  She gets winded with minimal activity.  She does not use her oxygen with activity >> tanks are too big.  TESTS: PFT 04/09/07 >> FEV1 1.75(107%), FEV1% 58, TLC 5.21(111%), DLCO 41%  Echo 03/23/13 >> mod LVH, EF 55 to 60%, mod AS, mild AR, mod MR, severe LA dilation, mild RA dilation, mod/severe TR, mod PR, PAS 71 mmHg  PMHx, PSHx, Medications, Allergies, Fhx, Shx reviewed.  Physical Exam:  General - in wheelchair ENT - no sinus tenderness Cardiac - s1s2 regular, 3/6 SM, pulses symmetric  Chest - prolonged exhalation, no wheeze/rales  Back - no focal tenderness  Abd - soft, non tender Ext - no edema Neuro - normal strength Skin - no rashes Psych - normal mood, and behavior.   Assessment/Plan:   Janalynn Eder Pager:  [phone] 11/02/2013, 3:19 PM

## 2013-11-04 ENCOUNTER — Encounter (HOSPITAL_COMMUNITY): Payer: Self-pay | Admitting: Emergency Medicine

## 2013-11-04 ENCOUNTER — Inpatient Hospital Stay (HOSPITAL_COMMUNITY)
Admission: EM | Admit: 2013-11-04 | Discharge: 2013-11-06 | DRG: 394 | Disposition: A | Discharge status: Nursing Home (Includes ICF) | Payer: Medicare Other | Attending: Internal Medicine | Admitting: Internal Medicine

## 2013-11-04 DIAGNOSIS — Z888 Allergy status to other drugs, medicaments and biological substances status: Secondary | ICD-10-CM | POA: Diagnosis not present

## 2013-11-04 DIAGNOSIS — M109 Gout, unspecified: Secondary | ICD-10-CM | POA: Diagnosis present

## 2013-11-04 DIAGNOSIS — I6529 Occlusion and stenosis of unspecified carotid artery: Secondary | ICD-10-CM | POA: Diagnosis present

## 2013-11-04 DIAGNOSIS — K648 Other hemorrhoids: Secondary | ICD-10-CM | POA: Diagnosis not present

## 2013-11-04 DIAGNOSIS — J310 Chronic rhinitis: Secondary | ICD-10-CM | POA: Diagnosis present

## 2013-11-04 DIAGNOSIS — K5900 Constipation, unspecified: Secondary | ICD-10-CM | POA: Diagnosis present

## 2013-11-04 DIAGNOSIS — H543 Unqualified visual loss, both eyes: Secondary | ICD-10-CM | POA: Diagnosis present

## 2013-11-04 DIAGNOSIS — Z823 Family history of stroke: Secondary | ICD-10-CM | POA: Diagnosis not present

## 2013-11-04 DIAGNOSIS — E039 Hypothyroidism, unspecified: Secondary | ICD-10-CM | POA: Diagnosis present

## 2013-11-04 DIAGNOSIS — Z800 Family history of malignant neoplasm of digestive organs: Secondary | ICD-10-CM

## 2013-11-04 DIAGNOSIS — Z66 Do not resuscitate: Secondary | ICD-10-CM | POA: Diagnosis present

## 2013-11-04 DIAGNOSIS — F411 Generalized anxiety disorder: Secondary | ICD-10-CM | POA: Diagnosis present

## 2013-11-04 DIAGNOSIS — R911 Solitary pulmonary nodule: Secondary | ICD-10-CM | POA: Diagnosis present

## 2013-11-04 DIAGNOSIS — F3289 Other specified depressive episodes: Secondary | ICD-10-CM | POA: Diagnosis present

## 2013-11-04 DIAGNOSIS — I509 Heart failure, unspecified: Secondary | ICD-10-CM | POA: Diagnosis present

## 2013-11-04 DIAGNOSIS — Z8673 Personal history of transient ischemic attack (TIA), and cerebral infarction without residual deficits: Secondary | ICD-10-CM | POA: Diagnosis not present

## 2013-11-04 DIAGNOSIS — M461 Sacroiliitis, not elsewhere classified: Secondary | ICD-10-CM | POA: Diagnosis present

## 2013-11-04 DIAGNOSIS — H3530 Unspecified macular degeneration: Secondary | ICD-10-CM | POA: Diagnosis present

## 2013-11-04 DIAGNOSIS — J449 Chronic obstructive pulmonary disease, unspecified: Secondary | ICD-10-CM | POA: Diagnosis present

## 2013-11-04 DIAGNOSIS — F329 Major depressive disorder, single episode, unspecified: Secondary | ICD-10-CM | POA: Diagnosis present

## 2013-11-04 DIAGNOSIS — Z8613 Personal history of malaria: Secondary | ICD-10-CM

## 2013-11-04 DIAGNOSIS — Z8249 Family history of ischemic heart disease and other diseases of the circulatory system: Secondary | ICD-10-CM | POA: Diagnosis not present

## 2013-11-04 DIAGNOSIS — Z887 Allergy status to serum and vaccine status: Secondary | ICD-10-CM | POA: Diagnosis not present

## 2013-11-04 DIAGNOSIS — K625 Hemorrhage of anus and rectum: Secondary | ICD-10-CM | POA: Diagnosis not present

## 2013-11-04 DIAGNOSIS — M47817 Spondylosis without myelopathy or radiculopathy, lumbosacral region: Secondary | ICD-10-CM | POA: Diagnosis present

## 2013-11-04 DIAGNOSIS — R3915 Urgency of urination: Secondary | ICD-10-CM | POA: Diagnosis present

## 2013-11-04 DIAGNOSIS — I10 Essential (primary) hypertension: Secondary | ICD-10-CM | POA: Diagnosis present

## 2013-11-04 DIAGNOSIS — K5730 Diverticulosis of large intestine without perforation or abscess without bleeding: Secondary | ICD-10-CM | POA: Diagnosis present

## 2013-11-04 DIAGNOSIS — D509 Iron deficiency anemia, unspecified: Secondary | ICD-10-CM | POA: Diagnosis present

## 2013-11-04 DIAGNOSIS — F0390 Unspecified dementia without behavioral disturbance: Secondary | ICD-10-CM | POA: Diagnosis present

## 2013-11-04 DIAGNOSIS — Z841 Family history of disorders of kidney and ureter: Secondary | ICD-10-CM | POA: Diagnosis not present

## 2013-11-04 DIAGNOSIS — J9610 Chronic respiratory failure, unspecified whether with hypoxia or hypercapnia: Secondary | ICD-10-CM | POA: Diagnosis present

## 2013-11-04 DIAGNOSIS — R197 Diarrhea, unspecified: Secondary | ICD-10-CM | POA: Diagnosis not present

## 2013-11-04 DIAGNOSIS — E785 Hyperlipidemia, unspecified: Secondary | ICD-10-CM | POA: Diagnosis present

## 2013-11-04 DIAGNOSIS — Z9981 Dependence on supplemental oxygen: Secondary | ICD-10-CM

## 2013-11-04 DIAGNOSIS — J4489 Other specified chronic obstructive pulmonary disease: Secondary | ICD-10-CM | POA: Diagnosis present

## 2013-11-04 DIAGNOSIS — N189 Chronic kidney disease, unspecified: Secondary | ICD-10-CM | POA: Diagnosis present

## 2013-11-04 DIAGNOSIS — G609 Hereditary and idiopathic neuropathy, unspecified: Secondary | ICD-10-CM | POA: Diagnosis present

## 2013-11-04 DIAGNOSIS — L408 Other psoriasis: Secondary | ICD-10-CM | POA: Diagnosis present

## 2013-11-04 DIAGNOSIS — J9612 Chronic respiratory failure with hypercapnia: Secondary | ICD-10-CM

## 2013-11-04 DIAGNOSIS — Z79899 Other long term (current) drug therapy: Secondary | ICD-10-CM

## 2013-11-04 DIAGNOSIS — D62 Acute posthemorrhagic anemia: Secondary | ICD-10-CM | POA: Diagnosis present

## 2013-11-04 DIAGNOSIS — I129 Hypertensive chronic kidney disease with stage 1 through stage 4 chronic kidney disease, or unspecified chronic kidney disease: Secondary | ICD-10-CM | POA: Diagnosis present

## 2013-11-04 DIAGNOSIS — Z7901 Long term (current) use of anticoagulants: Secondary | ICD-10-CM

## 2013-11-04 DIAGNOSIS — K922 Gastrointestinal hemorrhage, unspecified: Secondary | ICD-10-CM | POA: Diagnosis present

## 2013-11-04 DIAGNOSIS — I359 Nonrheumatic aortic valve disorder, unspecified: Secondary | ICD-10-CM | POA: Diagnosis present

## 2013-11-04 DIAGNOSIS — G8929 Other chronic pain: Secondary | ICD-10-CM | POA: Diagnosis present

## 2013-11-04 DIAGNOSIS — Z85828 Personal history of other malignant neoplasm of skin: Secondary | ICD-10-CM

## 2013-11-04 DIAGNOSIS — K219 Gastro-esophageal reflux disease without esophagitis: Secondary | ICD-10-CM | POA: Diagnosis present

## 2013-11-04 DIAGNOSIS — Z87891 Personal history of nicotine dependence: Secondary | ICD-10-CM | POA: Diagnosis not present

## 2013-11-04 DIAGNOSIS — I350 Nonrheumatic aortic (valve) stenosis: Secondary | ICD-10-CM | POA: Diagnosis present

## 2013-11-04 DIAGNOSIS — I4891 Unspecified atrial fibrillation: Secondary | ICD-10-CM | POA: Diagnosis present

## 2013-11-04 DIAGNOSIS — G40909 Epilepsy, unspecified, not intractable, without status epilepticus: Secondary | ICD-10-CM | POA: Diagnosis present

## 2013-11-04 HISTORY — DX: Cardiac arrhythmia, unspecified: I49.9

## 2013-11-04 HISTORY — DX: Nonrheumatic aortic (valve) stenosis: I35.0

## 2013-11-04 HISTORY — DX: Arteritis, unspecified: I77.6

## 2013-11-04 LAB — URINE MICROSCOPIC-ADD ON

## 2013-11-04 LAB — URINALYSIS, ROUTINE W REFLEX MICROSCOPIC
BILIRUBIN URINE: NEGATIVE
Glucose, UA: NEGATIVE mg/dL
KETONES UR: NEGATIVE mg/dL
Leukocytes, UA: NEGATIVE
Nitrite: NEGATIVE
PH: 5 (ref 5.0–8.0)
Protein, ur: NEGATIVE mg/dL
Specific Gravity, Urine: 1.009 (ref 1.005–1.030)
Urobilinogen, UA: 0.2 mg/dL (ref 0.0–1.0)

## 2013-11-04 LAB — CBC
HCT: 29 % — ABNORMAL LOW (ref 36.0–46.0)
Hemoglobin: 8.5 g/dL — ABNORMAL LOW (ref 12.0–15.0)
MCH: 21.5 pg — ABNORMAL LOW (ref 26.0–34.0)
MCHC: 29.3 g/dL — ABNORMAL LOW (ref 30.0–36.0)
MCV: 73.2 fL — ABNORMAL LOW (ref 78.0–100.0)
Platelets: 309 10*3/uL (ref 150–400)
RBC: 3.96 MIL/uL (ref 3.87–5.11)
RDW: 17.2 % — ABNORMAL HIGH (ref 11.5–15.5)
WBC: 7 10*3/uL (ref 4.0–10.5)

## 2013-11-04 LAB — TYPE AND SCREEN
ABO/RH(D): A POS
ANTIBODY SCREEN: NEGATIVE

## 2013-11-04 LAB — COMPREHENSIVE METABOLIC PANEL
ALT: 6 U/L (ref 0–35)
AST: 25 U/L (ref 0–37)
Albumin: 3.2 g/dL — ABNORMAL LOW (ref 3.5–5.2)
Alkaline Phosphatase: 89 U/L (ref 39–117)
Anion gap: 14 (ref 5–15)
BUN: 17 mg/dL (ref 6–23)
CALCIUM: 9.1 mg/dL (ref 8.4–10.5)
CO2: 21 meq/L (ref 19–32)
Chloride: 103 mEq/L (ref 96–112)
Creatinine, Ser: 1.24 mg/dL — ABNORMAL HIGH (ref 0.50–1.10)
GFR, EST AFRICAN AMERICAN: 42 mL/min — AB (ref >90)
GFR, EST NON AFRICAN AMERICAN: 37 mL/min — AB (ref >90)
GLUCOSE: 89 mg/dL (ref 70–99)
Potassium: 4.3 mEq/L (ref 3.7–5.3)
Sodium: 138 mEq/L (ref 137–147)
Total Bilirubin: 0.3 mg/dL (ref 0.3–1.2)
Total Protein: 7.5 g/dL (ref 6.0–8.3)

## 2013-11-04 LAB — CBC WITH DIFFERENTIAL/PLATELET
Basophils Absolute: 0.1 10*3/uL (ref 0.0–0.1)
Basophils Relative: 1 % (ref 0–1)
EOS ABS: 0.5 10*3/uL (ref 0.0–0.7)
Eosinophils Relative: 7 % — ABNORMAL HIGH (ref 0–5)
HCT: 29.6 % — ABNORMAL LOW (ref 36.0–46.0)
Hemoglobin: 8.6 g/dL — ABNORMAL LOW (ref 12.0–15.0)
Lymphocytes Relative: 28 % (ref 12–46)
Lymphs Abs: 1.9 10*3/uL (ref 0.7–4.0)
MCH: 21.6 pg — AB (ref 26.0–34.0)
MCHC: 29.1 g/dL — AB (ref 30.0–36.0)
MCV: 74.2 fL — ABNORMAL LOW (ref 78.0–100.0)
MONO ABS: 0.6 10*3/uL (ref 0.1–1.0)
Monocytes Relative: 9 % (ref 3–12)
NEUTROS PCT: 55 % (ref 43–77)
Neutro Abs: 3.6 10*3/uL (ref 1.7–7.7)
PLATELETS: 343 10*3/uL (ref 150–400)
RBC: 3.99 MIL/uL (ref 3.87–5.11)
RDW: 17.5 % — ABNORMAL HIGH (ref 11.5–15.5)
WBC: 6.7 10*3/uL (ref 4.0–10.5)

## 2013-11-04 LAB — POC OCCULT BLOOD, ED: FECAL OCCULT BLD: POSITIVE — AB

## 2013-11-04 LAB — PROTIME-INR
INR: 2.06 — AB (ref 0.00–1.49)
Prothrombin Time: 23.2 seconds — ABNORMAL HIGH (ref 11.6–15.2)

## 2013-11-04 LAB — MAGNESIUM: Magnesium: 1.8 mg/dL (ref 1.5–2.5)

## 2013-11-04 MED ORDER — ACETAMINOPHEN 325 MG PO TABS
650.0000 mg | ORAL_TABLET | Freq: Every morning | ORAL | Status: DC
  Administered 2013-11-05 – 2013-11-06 (×2): 650 mg via ORAL
  Filled 2013-11-04 (×2): qty 2

## 2013-11-04 MED ORDER — SODIUM CHLORIDE 0.9 % IJ SOLN
3.0000 mL | Freq: Two times a day (BID) | INTRAMUSCULAR | Status: DC
  Administered 2013-11-04: 3 mL via INTRAVENOUS

## 2013-11-04 MED ORDER — SODIUM CHLORIDE 0.9 % IV SOLN
INTRAVENOUS | Status: DC
  Administered 2013-11-04 – 2013-11-05 (×3): via INTRAVENOUS

## 2013-11-04 MED ORDER — GI COCKTAIL ~~LOC~~
30.0000 mL | Freq: Three times a day (TID) | ORAL | Status: DC | PRN
  Filled 2013-11-04: qty 30

## 2013-11-04 MED ORDER — ESCITALOPRAM OXALATE 20 MG PO TABS
20.0000 mg | ORAL_TABLET | Freq: Every morning | ORAL | Status: DC
  Administered 2013-11-05 – 2013-11-06 (×2): 20 mg via ORAL
  Filled 2013-11-04 (×2): qty 1

## 2013-11-04 MED ORDER — LORATADINE 10 MG PO TABS
10.0000 mg | ORAL_TABLET | Freq: Every day | ORAL | Status: DC
  Administered 2013-11-05 – 2013-11-06 (×2): 10 mg via ORAL
  Filled 2013-11-04 (×2): qty 1

## 2013-11-04 MED ORDER — DONEPEZIL HCL 10 MG PO TABS
10.0000 mg | ORAL_TABLET | Freq: Every day | ORAL | Status: DC
  Administered 2013-11-04 – 2013-11-05 (×2): 10 mg via ORAL
  Filled 2013-11-04 (×3): qty 1

## 2013-11-04 MED ORDER — SIMVASTATIN 20 MG PO TABS
20.0000 mg | ORAL_TABLET | Freq: Every day | ORAL | Status: DC
  Administered 2013-11-05: 20 mg via ORAL
  Filled 2013-11-04 (×2): qty 1

## 2013-11-04 MED ORDER — ACETAMINOPHEN 325 MG PO TABS: 650.0000 mg | ORAL_TABLET | Freq: Four times a day (QID) | ORAL | Status: DC | PRN

## 2013-11-04 MED ORDER — IPRATROPIUM BROMIDE 0.02 % IN SOLN: 0.5000 mg | RESPIRATORY_TRACT | Status: DC | PRN

## 2013-11-04 MED ORDER — SODIUM CHLORIDE 0.9 % IV SOLN
INTRAVENOUS | Status: DC
  Filled 2013-11-04: qty 1000

## 2013-11-04 MED ORDER — SODIUM CHLORIDE 0.9 % IV SOLN: INTRAVENOUS | Status: DC

## 2013-11-04 MED ORDER — HYDROCODONE-ACETAMINOPHEN 5-325 MG PO TABS: 1.0000 | ORAL_TABLET | Freq: Four times a day (QID) | ORAL | Status: DC | PRN

## 2013-11-04 MED ORDER — OXYCODONE HCL 5 MG PO TABS: 5.0000 mg | ORAL_TABLET | ORAL | Status: DC | PRN

## 2013-11-04 MED ORDER — MUPIROCIN 2 % EX OINT: 1.0000 "application " | TOPICAL_OINTMENT | Freq: Every day | CUTANEOUS | Status: DC

## 2013-11-04 MED ORDER — CHLORHEXIDINE GLUCONATE 0.12 % MT SOLN
15.0000 mL | Freq: Two times a day (BID) | OROMUCOSAL | Status: DC
  Administered 2013-11-04 – 2013-11-06 (×4): 15 mL via OROMUCOSAL
  Filled 2013-11-04 (×5): qty 15

## 2013-11-04 MED ORDER — PROPYLENE GLYCOL 0.6 % OP SOLN: 1.0000 [drp] | Freq: Every day | OPHTHALMIC | Status: DC | PRN

## 2013-11-04 MED ORDER — ENALAPRIL MALEATE 5 MG PO TABS
5.0000 mg | ORAL_TABLET | Freq: Every morning | ORAL | Status: DC
  Administered 2013-11-05 – 2013-11-06 (×2): 5 mg via ORAL
  Filled 2013-11-04 (×2): qty 1

## 2013-11-04 MED ORDER — ALBUTEROL SULFATE HFA 108 (90 BASE) MCG/ACT IN AERS: 2.0000 | INHALATION_SPRAY | Freq: Four times a day (QID) | RESPIRATORY_TRACT | Status: DC | PRN

## 2013-11-04 MED ORDER — ONDANSETRON HCL 4 MG PO TABS: 4.0000 mg | ORAL_TABLET | Freq: Four times a day (QID) | ORAL | Status: DC | PRN

## 2013-11-04 MED ORDER — POLYVINYL ALCOHOL 1.4 % OP SOLN
1.0000 [drp] | OPHTHALMIC | Status: DC | PRN
  Filled 2013-11-04: qty 15

## 2013-11-04 MED ORDER — PANTOPRAZOLE SODIUM 40 MG IV SOLR
40.0000 mg | INTRAVENOUS | Status: DC
  Administered 2013-11-04 – 2013-11-05 (×2): 40 mg via INTRAVENOUS
  Filled 2013-11-04 (×3): qty 40

## 2013-11-04 MED ORDER — OLOPATADINE HCL 0.1 % OP SOLN: 1.0000 [drp] | Freq: Two times a day (BID) | OPHTHALMIC | Status: DC | PRN

## 2013-11-04 MED ORDER — ONDANSETRON HCL 4 MG/2ML IJ SOLN: 4.0000 mg | Freq: Four times a day (QID) | INTRAMUSCULAR | Status: DC | PRN

## 2013-11-04 MED ORDER — LEVOTHYROXINE SODIUM 88 MCG PO TABS
88.0000 ug | ORAL_TABLET | Freq: Every day | ORAL | Status: DC
  Administered 2013-11-05 – 2013-11-06 (×2): 88 ug via ORAL
  Filled 2013-11-04 (×3): qty 1

## 2013-11-04 MED ORDER — ALBUTEROL SULFATE (2.5 MG/3ML) 0.083% IN NEBU: 2.5000 mg | INHALATION_SOLUTION | RESPIRATORY_TRACT | Status: DC | PRN

## 2013-11-04 MED ORDER — GABAPENTIN 100 MG PO CAPS
200.0000 mg | ORAL_CAPSULE | Freq: Every day | ORAL | Status: DC
  Administered 2013-11-04 – 2013-11-05 (×2): 200 mg via ORAL
  Filled 2013-11-04 (×3): qty 2

## 2013-11-04 MED ORDER — POLYETHYLENE GLYCOL 3350 17 G PO PACK
17.0000 g | PACK | Freq: Every morning | ORAL | Status: DC
  Administered 2013-11-05: 17 g via ORAL
  Filled 2013-11-04: qty 1

## 2013-11-04 NOTE — ED Notes (Signed)
Bed: WA04 Expected date:  Expected time:  Means of arrival:  Comments: ems- 80 yo rectal bleeding

## 2013-11-04 NOTE — ED Notes (Signed)
Initial Contact - pt resting on stretcher, A+Ox4, c/o 2/10 low back pain which is chronic "but feels different".  Pt reports two episodes BRBPR with BM earlier today.  Pt denies dizziness, weakness, syncope, CP/SOB.  Pt reports on coumadin for carotid stenosis, pt denies afib.  +bsx4 quads.  abd s/nt/nd.  Pt reports "fullness" in abd.   Speaking full/clear sentences, rr even/un-lab.  Skin PWD.  MAEI, self repositioning for comfort.  Pt assisted to void on bedpan at this time.  Brief with trace streak of red noted, no active rectal bleeding noted.  Changed to hospital gown, placed to cardiac/02 monitor.  NAD.

## 2013-11-04 NOTE — ED Provider Notes (Addendum)
Medical screening examination/treatment/procedure(s) were conducted as a shared visit with non-physician practitioner(s) and myself.  I personally evaluated the patient during the encounter.   EKG Interpretation   Date/Time:  Wednesday November 04 2013 15:20:01 EDT Ventricular Rate:  65 PR Interval:    QRS Duration: 89 QT Interval:  422 QTC Calculation: 439 R Axis:   7 Text Interpretation:  Atrial fibrillation Low voltage, precordial leads  Anteroseptal infarct, old Since previous tracing afib has replaced  bradycardia Confirmed by Canary Brim  MD, [place] [phone]) on 11/04/2013 11:38:51  PM     Pt presenting with rectal bleeding, she is anemic and is having continued bleeding. No abdominal pain.  Pt has no abdominal tenderness, she is awake and alert, NAD.  Vital signs are stable.  She is admitted to triad for further management and will have GI consult in the morning.    Threasa Beards, MD 11/04/13 1845  Threasa Beards, MD 11/04/13 [phone]

## 2013-11-04 NOTE — Progress Notes (Signed)
Clinical Social Work Department BRIEF PSYCHOSOCIAL ASSESSMENT 11/04/2013  Patient:  [name], [name]     Account Number:  [id number]     Admit date:  11/04/2013  Clinical Social Worker:  Luretha Rued  Date/Time:  11/04/2013 04:30 PM  Referred by:  CSW  Date Referred:  11/04/2013  Other Referral:   Interview type:  Patient Other interview type:   Caregiver at bedside named [name]    PSYCHOSOCIAL DATA Living Status:  FACILITY Admitted from facility:  [facility] Level of care:  Independent Living Primary support name:  [name] Primary support relationship to patient:  CHILD, ADULT Degree of support available:   High    CURRENT CONCERNS  Other Concerns:    SOCIAL WORK ASSESSMENT / PLAN CSW met with patient at bedside to complete this assessment. Patient is alert oriented x3, calm, cooperative.  Patient's caregiver [name] was at the bedside for any assistance.  Patient reports that she intends to returns back to [name] once she is medically cleared. CSW informed her of the process and needed paperwork if she is admitted inpatient to the hospital.   Assessment/plan status:   Other assessment/ plan:   Information/referral to community resources:   None at this time    PATIENT'S/FAMILY'S RESPONSE TO PLAN OF CARE: Patient was thankful     Chesley Noon, MSW, [place], 11/04/2013 Evening Clinical Social Worker [phone]

## 2013-11-04 NOTE — H&P (Signed)
[facility] History and Physical  Anabeth Chilcott VQQ:[id number] DOB: Jun 13, 1919 DOA: 11/04/2013  Referring physician: Dr. Canary Brim PCP: Kandice Hams, MD /gastroenterologist: Dr. Patterson/cardiologist: Dr. Stanford Breed  Chief Complaint: Rectal bleeding  HPI: [name] is a 80 y.o. female  Pleasant female resident at [facility], with history of macular degeneration and blindness, chronic respiratory failure on home O2, atrial fibrillation and carotid artery occlusion on chronic anticoagulation, history of TIA, aortic valve stenosis, dementia, hypertension, hypothyroidism who presents to the ED with a 2 day history of rectal bleeding. Patient stated not as bright red blood per rectum associated with bowel movements which have been intermittent. Patient stated we'll called this morning had another bowel movement which was bright red. Patient had 2 bloody bowel movements today per report from assisted living facility. Patient does endorse some abdominal fullness however denies abdominal pain. Patient complaining of some right-sided hip pain. Patient denies any nausea, no vomiting, no fever, no chills, no chest pain, no change in her chronic shortness of breath, no hematemesis, no melena. Patient does endorse some urinary urgency. Patient denies any dysuria. Patient denies any constipation, no diarrhea. Patient does endorse some generalized weakness which is more pronounced done her usual state. Patient states he uses a walker and scooter and I will check to get around. Patient stated had similar bleeding before in the past however was never told what it was from. Patient was seen in the emergency room for global blood test which was done was positive. CBC done had a hemoglobin of 8.6 otherwise was within normal limits. Comprehensive metabolic profile the creatinine of 1.24 and albumin of 3.2 otherwise was within normal limits. EKG showed atrial fibrillation with a  heart rate of 65. We were called to admit the patient for further evaluation and management.   Review of Systems: As per history of present illness otherwise negative. Constitutional:  No weight loss, night sweats, Fevers, chills, fatigue.  HEENT:  No headaches, Difficulty swallowing,Tooth/dental problems,Sore throat,  No sneezing, itching, ear ache, nasal congestion, post nasal drip,  Cardio-vascular:  No chest pain, Orthopnea, PND, swelling in lower extremities, anasarca, dizziness, palpitations  GI:  No heartburn, indigestion, abdominal pain, nausea, vomiting, diarrhea, change in bowel habits, loss of appetite  Resp:  No shortness of breath with exertion or at rest. No excess mucus, no productive cough, No non-productive cough, No coughing up of blood.No change in color of mucus.No wheezing.No chest wall deformity  Skin:  no rash or lesions.  GU:  no dysuria, change in color of urine, no urgency or frequency. No flank pain.  Musculoskeletal:  No joint pain or swelling. No decreased range of motion. No back pain.  Psych:  No change in mood or affect. No depression or anxiety. No memory loss.   Past Medical History  Diagnosis Date  . Cholelithiases   . Hypothyroid   . Leg paresthesia   . Aortic valve stenosis   . Arthritis   . Hyperlipidemia   . Dementia   . Carotid artery occlusion   . Renal disorder   . Hypertension   . COPD (chronic obstructive pulmonary disease)   . SEIZURE DISORDER 11/05/2006  . GERD 11/05/2006  . CVA 02/07/2007  . Macular degeneration of both eyes   . Blind in both eyes   . Peripheral neuropathy   . Pneumonia     "many times"  . Internal hemorrhoid, bleeding   . Malaria     "as a child"  . Skin cancer     "  nose, face mostly"  . Chronic lower back pain     "when I go to the bathroom"  . Intestinal infection due to Clostridium difficile 09/2011  . Benign neoplasm of breast   . Gout, unspecified   . Anemia   . Anxiety   . Other dysfunctions  of sleep stages or arousal from sleep   . Depression   . Chronic rhinitis   . Abscess of lung(513.0)     RUL  . Diaphragmatic hernia without mention of obstruction or gangrene   . Diverticulosis of colon (without mention of hemorrhage)   . Acute duodenal ulcer with perforation and obstruction 08/2011    perforation  . Hemorrhage of rectum and anus   . Abscess of intestine 09/2011    drainage  . Other psoriasis   . Lumbago   . Nonspecific elevation of levels of transaminase or lactic acid dehydrogenase (LDH)   . Solitary pulmonary nodule     RLL nodule 1 cm  . Transient ischemic attack (TIA), and cerebral infarction without residual deficits(V12.54)   . Long term (current) use of anticoagulants   . Sacroiliitis 08/27/2012  . Anemia, iron deficiency 10/16/2011  . Congestive heart failure   . Spondylosis of lumbosacral region 09/24/2012    L/S xray 08/2012: Mild to moderate osteoarthritis and degenerative spondylosis he diffusely. Anterior subluxation of L4 relative to L5 at 5.7 cm. Mild to moderate levoconvex curvature the mid lumbar spine    . Dysrhythmia     hx atrial fib  . Arteritis     hx giant cell arteritis   Past Surgical History  Procedure Laterality Date  . Vesicovaginal fistula closure w/ tah    . Tonsillectomy    . Breast lumpectomy      bilaterally  . Cataract extraction w/ intraocular lens  implant, bilateral    . Skin cancer excision      "nose and face"  . Abdominal hysterectomy    . Hemorrhoid surgery  11/2008    banding   Social History:  reports that she quit smoking about 20 years ago. Her smoking use included Cigarettes. She has a 50 pack-year smoking history. She has never used smokeless tobacco. She reports that she drinks alcohol. She reports that she does not use illicit drugs.  Allergies  Allergen Reactions  . Cortisone Other (See Comments)  . Minocin [Minocycline Hcl] Other (See Comments)    Unknown  . Minocycline Hcl   . Neomycin-Bacitracin  Zn-Polymyx   . Neosporin [Neomycin-Bacitracin Zn-Polymyx] Other (See Comments)    Unknown  . Other Other (See Comments)    Steroidal Neuromuscular Blockers  . Tetanus Toxoids Other (See Comments)    Unknown    Family History  Problem Relation Age of Onset  . Hyperlipidemia    . Hypertension    . Stroke    . Colon cancer Daughter   . Heart attack Father   . Kidney failure Father   . Heart disease Son      Prior to Admission medications   Medication Sig Start Date End Date Taking? Authorizing Provider  acetaminophen (TYLENOL) 325 MG tablet Take 650 mg by mouth every 6 (six) hours as needed (For pain.).    Yes Historical Provider, MD  acetaminophen (TYLENOL) 325 MG tablet Take 650 mg by mouth every morning.   Yes Historical Provider, MD  acyclovir ointment (ZOVIRAX) 5 % Apply 1 application topically every 3 (three) hours as needed. For cold sores   Yes Historical Provider, MD  albuterol (PROVENTIL HFA;VENTOLIN HFA) 108 (90 BASE) MCG/ACT inhaler Inhale 2 puffs into the lungs every 6 (six) hours as needed for wheezing or shortness of breath.   Yes Historical Provider, MD  amoxicillin (AMOXIL) 500 MG capsule Take 2,000 mg by mouth once. Prior to dental procedure   Yes Historical Provider, MD  augmented betamethasone dipropionate (DIPROLENE-AF) 0.05 % ointment Apply 1 application topically 2 (two) times daily.   Yes Historical Provider, MD  cetirizine (ZYRTEC) 10 MG tablet Take 10 mg by mouth daily.   Yes Historical Provider, MD  chlorhexidine (PERIDEX) 0.12 % solution Use as directed 15 mLs in the mouth or throat. swish 15 mls for 30 sec then spit, twice daily   Yes Historical Provider, MD  Clocortolone Pivalate (CLODERM) 0.1 % cream Apply 1 application topically 2 (two) times daily. Applies to lesion on left cheek.   Yes Historical Provider, MD  coal tar (NEUTROGENA T-GEL) 0.5 % shampoo Apply 1 application topically See admin instructions. She uses on Tuesday and Friday.   Yes Historical  Provider, MD  donepezil (ARICEPT) 10 MG tablet Take 10 mg by mouth at bedtime.    Yes Historical Provider, MD  Emollient (CERAVE) CREA Apply 1 application topically daily. Applies to dry skin.   Yes Historical Provider, MD  enalapril (VASOTEC) 5 MG tablet Take 5 mg by mouth every morning.    Yes Historical Provider, MD  escitalopram (LEXAPRO) 20 MG tablet Take 20 mg by mouth every morning.    Yes Historical Provider, MD  furosemide (LASIX) 40 MG tablet Take 40 mg by mouth every morning.  09/10/13  Yes Historical Provider, MD  gabapentin (NEURONTIN) 100 MG capsule Take 200 mg by mouth at bedtime.    Yes Historical Provider, MD  HYDROcodone-acetaminophen (NORCO/VICODIN) 5-325 MG per tablet Take 1-2 tablets by mouth every 6 (six) hours as needed for moderate pain.   Yes Historical Provider, MD  hydrOXYzine (ATARAX/VISTARIL) 25 MG tablet Take 25 mg by mouth at bedtime.   Yes Historical Provider, MD  levothyroxine (SYNTHROID, LEVOTHROID) 88 MCG tablet Take 88 mcg by mouth daily before breakfast.    Yes Historical Provider, MD  Multiple Vitamins-Minerals (PRESERVISION AREDS) CAPS Take 1 capsule by mouth 2 (two) times daily with a meal.    Yes Historical Provider, MD  mupirocin ointment (BACTROBAN) 2 % Place 1 application into the nose daily.   Yes Historical Provider, MD  olopatadine (PATANOL) 0.1 % ophthalmic solution Place 1 drop into both eyes 2 (two) times daily as needed. For dry eyes   Yes Historical Provider, MD  polyethylene glycol (MIRALAX / GLYCOLAX) packet Take 17 g by mouth every morning.    Yes Historical Provider, MD  potassium chloride SA (K-DUR,KLOR-CON) 20 MEQ tablet Take 40 mEq by mouth daily.   Yes Historical Provider, MD  pramoxine-mineral oil-zinc (TUCKS) 1-12.5 % rectal ointment Place 1 application rectally every 2 (two) hours as needed for itching (itching).   Yes Historical Provider, MD  pravastatin (PRAVACHOL) 40 MG tablet Take 1 tablet (40 mg total) by mouth every evening. 03/02/13   Yes Lelon Perla, MD  Propylene Glycol (SYSTANE BALANCE) 0.6 % SOLN Place 1 drop into both eyes daily as needed (For dry or itchy eyes.). One drop both eye as needed   Yes Historical Provider, MD  senna-docusate (SENOKOT S) 8.6-50 MG per tablet Take 1-2 tablets by mouth 2 (two) times daily. She takes one tablet in the morning and two tablets at bedtime.   Yes Historical  Provider, MD  tiotropium (SPIRIVA) 18 MCG inhalation capsule Place 18 mcg into inhaler and inhale daily.   Yes Historical Provider, MD  warfarin (COUMADIN) 4 MG tablet Take 4 mg by mouth every Thursday.  07/29/13  Yes Claudette Jeri Cos, NP  warfarin (COUMADIN) 5 MG tablet Take 5 mg by mouth See admin instructions. She takes on Monday, Tuesday, Wednesday, Friday, Saturday and Sunday. 07/29/13  Yes Mardene Celeste, NP   Physical Exam: Filed Vitals:   11/04/13 1501 11/04/13 1507 11/04/13 1830  BP:  133/52 162/62  Pulse:  61 61  Temp:  98.3 F (36.8 C)   TempSrc:  Oral   Resp:  18 15  SpO2: 97% 97% 97%    Wt Readings from Last 3 Encounters:  11/02/13 71.215 kg (157 lb)  10/29/13 71.215 kg (157 lb)  10/14/13 72.122 kg (159 lb)    General:  Well-developed well-nourished in no acute cardiopulmonary distress speaking in full sentences.  Eyes: PERRLA, EOMI, normal lids, irises & conjunctiva ENT: grossly normal hearing, lips & tongue Neck: no LAD, masses or thyromegaly Cardiovascular: Irregularly irregular with a 3/6 holosystolic murmur at the left lower sternal border. No LE edema. Telemetry: Atrial fibrillation Respiratory: CTA bilaterally, no w/r/r. Normal respiratory effort. Abdomen: soft, ntnd, positive bowel sounds, no rebound, no guarding Skin: no rash or induration seen on limited exam Musculoskeletal: grossly normal tone BUE/BLE Psychiatric: grossly normal mood and affect, speech fluent and appropriate Neurologic: Alert and oriented x3. Cranial nerves II through XII are grossly intact. Sensation is intact. Gait  not tested secondary to safety. No focal deficits.           Labs on Admission:  Basic Metabolic Panel:  Recent Labs Lab 11/04/13 1548  NA 138  K 4.3  CL 103  CO2 21  GLUCOSE 89  BUN 17  CREATININE 1.24*  CALCIUM 9.1   Liver Function Tests:  Recent Labs Lab 11/04/13 1548  AST 25  ALT 6  ALKPHOS 89  BILITOT 0.3  PROT 7.5  ALBUMIN 3.2*   No results found for this basename: LIPASE, AMYLASE,  in the last 168 hours No results found for this basename: AMMONIA,  in the last 168 hours CBC:  Recent Labs Lab 11/04/13 1548  WBC 6.7  NEUTROABS 3.6  HGB 8.6*  HCT 29.6*  MCV 74.2*  PLT 343   Cardiac Enzymes: No results found for this basename: CKTOTAL, CKMB, CKMBINDEX, TROPONINI,  in the last 168 hours  BNP (last 3 results) No results found for this basename: PROBNP,  in the last 8760 hours CBG: No results found for this basename: GLUCAP,  in the last 168 hours  Radiological Exams on Admission: No results found.  EKG: Independently reviewed. Atrial fibrillation with a heart rate of 65.  Assessment/Plan Principal Problem:   GI bleed Active Problems:   Chronic rhinitis   COPD   Chronic respiratory failure   CONGESTIVE HEART FAILURE   HYPOTHYROIDISM   Long term (current) use of anticoagulants   Carotid artery occlusion   Sacroiliitis   Spondylosis of lumbosacral region   Transient ischemic attack (TIA), and cerebral infarction without residual deficits(V12.54)   Essential hypertension   Hyperlipidemia   Aortic stenosis   Rectal bleeding   Acute blood loss anemia   GIB (gastrointestinal bleeding)   #1 GI bleed Patient presenting with rectal bleeding which is bright red x2 days. Hemoglobin currently at 8.6. Patient does have a history of diverticulosis and internal hemorrhoids. Likely lower GI  bleed. Will admit the patient to telemetry. Will get serial CBCs every 8 hours. Type and screen. Place on a PPI. Transfusion threshold hemoglobin less than 7.  Gentle hydration. Hold Coumadin for now and defer when to resume to gastroenterology. GI has been consulted per ED PA and patient will be assessed in the morning. Follow.  #2 hypothyroidism Check a TSH. Continue home dose Synthroid.  #3 atrial fibrillation Currently rate controlled. Patient was on Coumadin for anticoagulation. INR is 2.06. Will hold Coumadin for now secondary to problem #1 and defer resumption of Coumadin to gastroenterology.  #4 hypertension Continue enalapril.  #5 history of aortic stenosis Patient with complaints of worsening weakness which may be secondary to problem #1. Will get a 2-D echo. Follow.  #6 acute blood loss anemia/anemia Likely secondary to problem #1. Check an anemia panel. Follow H&H. Transfusion threshold hemoglobin less than 7. GI has been consulted and will be following along.  #7 chronic kidney disease Stable. Follow.  #8 carotid artery occlusion Patient was on Coumadin for this however secondary to problem #1 Coumadin will be held. Will defer to GI when to resume Coumadin.  #9 chronic respiratory failure/COPD Stable. Continue oxygen, Spiriva.  #10 history of CHF Stable. Will check a 2-D echo. Continue enalapril, statin. We'll hold Lasix for now. Monitor fluid status with gentle hydration.  #11 gastroesophageal reflux disease PPI.  #12. History of TIA Will hold Coumadin secondary to problem #1.  #13 prophylaxis PPI for GI prophylaxis. SCDs for DVT prophylaxis.  Code Status: DO NOT RESUSCITATE DVT Prophylaxis:SCD Family Communication: updated patient and best friend (POA) Disposition Plan: Admit to telemetry  Time spent: Hill MD [facility] Pager [phone]  **Disclaimer: This note may have been dictated with voice recognition software. Similar sounding words can inadvertently be transcribed and this note may contain transcription errors which may not have been corrected upon publication of note.**

## 2013-11-04 NOTE — ED Notes (Signed)
inpt md at bedside for eval.

## 2013-11-04 NOTE — ED Notes (Signed)
Pt ambulatory with steady gait to void in BR.  Sm. Amt blood noted in brief.  Pt denies further needs/complaints.  NAD.

## 2013-11-04 NOTE — ED Provider Notes (Signed)
CSN: [id number]     Arrival date & time 11/04/13  1500 History   First MD Initiated Contact with Patient 11/04/13 1511     Chief Complaint  Patient presents with  . Rectal Bleeding     (Consider location/radiation/quality/duration/timing/severity/associated sxs/prior Treatment) HPI  PCP: Dr. Everlean Cherry: Loistine Chance- Dr. Sharlett Iles  Patient is here with family member who is ([place]) and care giver. Sent here by EMS from [facility] with complaints of rectal bleeding. The bleeding started yesterday and has been coming and going off and on. She reports only having bleeding associated with bowel movements. She has mild discomfort to her back that is a 2 /10 without pain medication. She is on coumadin for coratid occlusion (she reports almost complete, MVP, and a. fib. Patient admits she did not want to come to the Emergency Department but was forced. She reports that she feels fine but her family members say that she has been complaining of not feeling well at home.  Past Medical History  Diagnosis Date  . Cholelithiases   . Hypothyroid   . Leg paresthesia   . Aortic valve stenosis   . Arthritis   . Hyperlipidemia   . Dementia   . Carotid artery occlusion   . Renal disorder   . Hypertension   . COPD (chronic obstructive pulmonary disease)   . SEIZURE DISORDER 11/05/2006  . GERD 11/05/2006  . CVA 02/07/2007  . Macular degeneration of both eyes   . Blind in both eyes   . Peripheral neuropathy   . Pneumonia     "many times"  . Internal hemorrhoid, bleeding   . Malaria     "as a child"  . Skin cancer     "nose, face mostly"  . Chronic lower back pain     "when I go to the bathroom"  . Intestinal infection due to Clostridium difficile 09/2011  . Benign neoplasm of breast   . Gout, unspecified   . Anemia   . Anxiety   . Other dysfunctions of sleep stages or arousal from sleep   . Depression   . Chronic rhinitis   . Abscess of lung(513.0)     RUL  .  Diaphragmatic hernia without mention of obstruction or gangrene   . Diverticulosis of colon (without mention of hemorrhage)   . Acute duodenal ulcer with perforation and obstruction 08/2011    perforation  . Hemorrhage of rectum and anus   . Abscess of intestine 09/2011    drainage  . Other psoriasis   . Lumbago   . Nonspecific elevation of levels of transaminase or lactic acid dehydrogenase (LDH)   . Solitary pulmonary nodule     RLL nodule 1 cm  . Transient ischemic attack (TIA), and cerebral infarction without residual deficits(V12.54)   . Long term (current) use of anticoagulants   . Sacroiliitis 08/27/2012  . Anemia, iron deficiency 10/16/2011  . Congestive heart failure   . Spondylosis of lumbosacral region 09/24/2012    L/S xray 08/2012: Mild to moderate osteoarthritis and degenerative spondylosis he diffusely. Anterior subluxation of L4 relative to L5 at 5.7 cm. Mild to moderate levoconvex curvature the mid lumbar spine     Past Surgical History  Procedure Laterality Date  . Vesicovaginal fistula closure w/ tah    . Tonsillectomy    . Breast lumpectomy      bilaterally  . Cataract extraction w/ intraocular lens  implant, bilateral    . Skin cancer excision      "  nose and face"  . Abdominal hysterectomy    . Hemorrhoid surgery  11/2008    banding   Family History  Problem Relation Age of Onset  . Hyperlipidemia    . Hypertension    . Stroke    . Colon cancer Daughter   . Heart attack Father   . Kidney failure Father   . Heart disease Son    History  Substance Use Topics  . Smoking status: Former Smoker -- 1.00 packs/day for 50 years    Types: Cigarettes    Quit date: 08/08/1993  . Smokeless tobacco: Never Used  . Alcohol Use: Yes     Comment: 10/15/11 "have a drink when I go out to dinner; don't go out often"   OB History   Grav Para Term Preterm Abortions TAB SAB Ect Mult Living                 Review of Systems   Review of Systems  Gen: no weight loss,  fevers, chills, night sweats  Eyes: no discharge or drainage, no occular pain or visual changes  Nose: no epistaxis or rhinorrhea  Mouth: no dental pain, no sore throat  Neck: no neck pain  Lungs:No wheezing or hemoptysis No coughing CV:  No palpitations, dependent edema or orthopnea. No chest pain Abd: no diarrhea. No nausea or vomiting, No abdominal pain + rectal bleeding (painless) GU: no dysuria or gross hematuria  MSK:  No muscle weakness, No  pain Neuro: no headache, no focal neurologic deficits  Skin: no rash , no wounds Psyche: no complaints    Allergies  Cortisone; Minocin; Minocycline hcl; Neomycin-bacitracin zn-polymyx; Neosporin; Other; and Tetanus toxoids  Home Medications   Prior to Admission medications   Medication Sig Start Date End Date Taking? Authorizing Provider  acetaminophen (TYLENOL) 325 MG tablet Take 650 mg by mouth every 6 (six) hours as needed (For pain.).    Yes Historical Provider, MD  acetaminophen (TYLENOL) 325 MG tablet Take 650 mg by mouth every morning.   Yes Historical Provider, MD  acyclovir ointment (ZOVIRAX) 5 % Apply 1 application topically every 3 (three) hours as needed. For cold sores   Yes Historical Provider, MD  albuterol (PROVENTIL HFA;VENTOLIN HFA) 108 (90 BASE) MCG/ACT inhaler Inhale 2 puffs into the lungs every 6 (six) hours as needed for wheezing or shortness of breath.   Yes Historical Provider, MD  amoxicillin (AMOXIL) 500 MG capsule Take 2,000 mg by mouth once. Prior to dental procedure   Yes Historical Provider, MD  augmented betamethasone dipropionate (DIPROLENE-AF) 0.05 % ointment Apply 1 application topically 2 (two) times daily.   Yes Historical Provider, MD  cetirizine (ZYRTEC) 10 MG tablet Take 10 mg by mouth daily.   Yes Historical Provider, MD  chlorhexidine (PERIDEX) 0.12 % solution Use as directed 15 mLs in the mouth or throat. swish 15 mls for 30 sec then spit, twice daily   Yes Historical Provider, MD  Clocortolone  Pivalate (CLODERM) 0.1 % cream Apply 1 application topically 2 (two) times daily. Applies to lesion on left cheek.   Yes Historical Provider, MD  coal tar (NEUTROGENA T-GEL) 0.5 % shampoo Apply 1 application topically See admin instructions. She uses on Tuesday and Friday.   Yes Historical Provider, MD  donepezil (ARICEPT) 10 MG tablet Take 10 mg by mouth at bedtime.    Yes Historical Provider, MD  Emollient (CERAVE) CREA Apply 1 application topically daily. Applies to dry skin.   Yes Historical Provider,  MD  enalapril (VASOTEC) 5 MG tablet Take 5 mg by mouth every morning.    Yes Historical Provider, MD  escitalopram (LEXAPRO) 20 MG tablet Take 20 mg by mouth every morning.    Yes Historical Provider, MD  furosemide (LASIX) 40 MG tablet Take 40 mg by mouth every morning.  09/10/13  Yes Historical Provider, MD  gabapentin (NEURONTIN) 100 MG capsule Take 200 mg by mouth at bedtime.    Yes Historical Provider, MD  HYDROcodone-acetaminophen (NORCO/VICODIN) 5-325 MG per tablet Take 1-2 tablets by mouth every 6 (six) hours as needed for moderate pain.   Yes Historical Provider, MD  hydrOXYzine (ATARAX/VISTARIL) 25 MG tablet Take 25 mg by mouth at bedtime.   Yes Historical Provider, MD  levothyroxine (SYNTHROID, LEVOTHROID) 88 MCG tablet Take 88 mcg by mouth daily before breakfast.    Yes Historical Provider, MD  Multiple Vitamins-Minerals (PRESERVISION AREDS) CAPS Take 1 capsule by mouth 2 (two) times daily with a meal.    Yes Historical Provider, MD  mupirocin ointment (BACTROBAN) 2 % Place 1 application into the nose daily.   Yes Historical Provider, MD  olopatadine (PATANOL) 0.1 % ophthalmic solution Place 1 drop into both eyes 2 (two) times daily as needed. For dry eyes   Yes Historical Provider, MD  polyethylene glycol (MIRALAX / GLYCOLAX) packet Take 17 g by mouth every morning.    Yes Historical Provider, MD  potassium chloride SA (K-DUR,KLOR-CON) 20 MEQ tablet Take 40 mEq by mouth daily.   Yes  Historical Provider, MD  pramoxine-mineral oil-zinc (TUCKS) 1-12.5 % rectal ointment Place 1 application rectally every 2 (two) hours as needed for itching (itching).   Yes Historical Provider, MD  pravastatin (PRAVACHOL) 40 MG tablet Take 1 tablet (40 mg total) by mouth every evening. 03/02/13  Yes Lelon Perla, MD  Propylene Glycol (SYSTANE BALANCE) 0.6 % SOLN Place 1 drop into both eyes daily as needed (For dry or itchy eyes.). One drop both eye as needed   Yes Historical Provider, MD  senna-docusate (SENOKOT S) 8.6-50 MG per tablet Take 1-2 tablets by mouth 2 (two) times daily. She takes one tablet in the morning and two tablets at bedtime.   Yes Historical Provider, MD  tiotropium (SPIRIVA) 18 MCG inhalation capsule Place 18 mcg into inhaler and inhale daily.   Yes Historical Provider, MD  warfarin (COUMADIN) 4 MG tablet Take 4 mg by mouth every Thursday.  07/29/13  Yes Claudette Jeri Cos, NP  warfarin (COUMADIN) 5 MG tablet Take 5 mg by mouth See admin instructions. She takes on Monday, Tuesday, Wednesday, Friday, Saturday and Sunday. 07/29/13  Yes Claudette T Levie Heritage, NP   BP 133/52  Pulse 61  Temp(Src) 98.3 F (36.8 C) (Oral)  Resp 18  SpO2 97% Physical Exam  Nursing note and vitals reviewed. Constitutional: She appears well-developed and well-nourished. No distress.  Pt looks well and younger than her stated age.  HENT:  Head: Normocephalic and atraumatic.  Eyes: Pupils are equal, round, and reactive to light.  Neck: Normal range of motion. Neck supple.  Cardiovascular: Normal rate and regular rhythm.   Pulmonary/Chest: Effort normal.  Abdominal: Soft. Bowel sounds are normal. She exhibits no distension, no fluid wave, no ascites and no mass. There is no tenderness. There is no rigidity, no rebound, no guarding and no CVA tenderness.  Genitourinary: Rectal exam shows no tenderness. Guaiac positive stool.  Small streak of blood in underwear. Bright red blood in rectal vault, small  amount. No  stool. No active bleeding per rectum. No internal or external hemorrhoids appreciated.  Musculoskeletal:       Back:  Neurological: She is alert.  Skin: Skin is warm and dry. No purpura noted. She is not diaphoretic. No pallor.    ED Course  Procedures (including critical care time) Labs Review Labs Reviewed  PROTIME-INR - Abnormal; Notable for the following:    Prothrombin Time 23.2 (*)    INR 2.06 (*)    All other components within normal limits  COMPREHENSIVE METABOLIC PANEL - Abnormal; Notable for the following:    Creatinine, Ser 1.24 (*)    Albumin 3.2 (*)    GFR calc non Af Amer 37 (*)    GFR calc Af Amer 42 (*)    All other components within normal limits  CBC WITH DIFFERENTIAL - Abnormal; Notable for the following:    Hemoglobin 8.6 (*)    HCT 29.6 (*)    MCV 74.2 (*)    MCH 21.6 (*)    MCHC 29.1 (*)    RDW 17.5 (*)    Eosinophils Relative 7 (*)    All other components within normal limits  POC OCCULT BLOOD, ED - Abnormal; Notable for the following:    Fecal Occult Bld POSITIVE (*)    All other components within normal limits  OCCULT BLOOD X 1 CARD TO LAB, STOOL  TYPE AND SCREEN    Imaging Review No results found.   EKG Interpretation None      MDM   Final diagnoses:  Rectal bleeding    Patient looks well and INR is 2.06. I spoke with [facility], who has agreed to see patient in the morning.  Will call the hospitalist for admission.  Inpatient, [facility], tele, Team 8, [facility]     Linus Mako, PA-C 11/04/13 1754

## 2013-11-04 NOTE — ED Notes (Addendum)
PER EMS - pt from [facility] with c/o GIB since this AM with BM.  Pt also reports 2/10 low back pain.  VSS.  A+Ox4.  Ambulatory.  Abd s/nt.  No n/v.  Pt on coumadin for Afib.

## 2013-11-05 DIAGNOSIS — I359 Nonrheumatic aortic valve disorder, unspecified: Secondary | ICD-10-CM

## 2013-11-05 DIAGNOSIS — D509 Iron deficiency anemia, unspecified: Secondary | ICD-10-CM

## 2013-11-05 DIAGNOSIS — K625 Hemorrhage of anus and rectum: Secondary | ICD-10-CM

## 2013-11-05 LAB — CBC
HCT: 28.5 % — ABNORMAL LOW (ref 36.0–46.0)
HCT: 28.8 % — ABNORMAL LOW (ref 36.0–46.0)
Hemoglobin: 8.3 g/dL — ABNORMAL LOW (ref 12.0–15.0)
Hemoglobin: 8.3 g/dL — ABNORMAL LOW (ref 12.0–15.0)
MCH: 21.6 pg — AB (ref 26.0–34.0)
MCH: 21.1 pg — ABNORMAL LOW (ref 26.0–34.0)
MCHC: 29.1 g/dL — ABNORMAL LOW (ref 30.0–36.0)
MCHC: 28.8 g/dL — ABNORMAL LOW (ref 30.0–36.0)
MCV: 74.2 fL — AB (ref 78.0–100.0)
MCV: 73.3 fL — ABNORMAL LOW (ref 78.0–100.0)
PLATELETS: 299 10*3/uL (ref 150–400)
Platelets: 331 10*3/uL (ref 150–400)
RBC: 3.84 MIL/uL — AB (ref 3.87–5.11)
RBC: 3.93 MIL/uL (ref 3.87–5.11)
RDW: 17.5 % — ABNORMAL HIGH (ref 11.5–15.5)
RDW: 17.5 % — AB (ref 11.5–15.5)
WBC: 5.9 10*3/uL (ref 4.0–10.5)
WBC: 6.1 10*3/uL (ref 4.0–10.5)

## 2013-11-05 LAB — FERRITIN: Ferritin: 17 ng/mL (ref 10–291)

## 2013-11-05 LAB — FOLATE: FOLATE: 12.1 ng/mL

## 2013-11-05 LAB — BASIC METABOLIC PANEL
Anion gap: 14 (ref 5–15)
BUN: 14 mg/dL (ref 6–23)
CHLORIDE: 107 meq/L (ref 96–112)
CO2: 22 mEq/L (ref 19–32)
Calcium: 8.6 mg/dL (ref 8.4–10.5)
Creatinine, Ser: 1.16 mg/dL — ABNORMAL HIGH (ref 0.50–1.10)
GFR calc Af Amer: 46 mL/min — ABNORMAL LOW (ref >90)
GFR calc non Af Amer: 40 mL/min — ABNORMAL LOW (ref >90)
Glucose, Bld: 87 mg/dL (ref 70–99)
Potassium: 3.7 mEq/L (ref 3.7–5.3)
Sodium: 143 mEq/L (ref 137–147)

## 2013-11-05 LAB — IRON AND TIBC
IRON: 11 ug/dL — AB (ref 42–135)
Saturation Ratios: 3 % — ABNORMAL LOW (ref 20–55)
TIBC: 422 ug/dL (ref 250–470)
UIBC: 411 ug/dL — AB (ref 125–400)

## 2013-11-05 LAB — VITAMIN B12: Vitamin B-12: 641 pg/mL (ref 211–911)

## 2013-11-05 LAB — URINE CULTURE

## 2013-11-05 LAB — MRSA PCR SCREENING: MRSA BY PCR: NEGATIVE

## 2013-11-05 LAB — PROTIME-INR
INR: 1.97 — ABNORMAL HIGH (ref 0.00–1.49)
Prothrombin Time: 22.4 seconds — ABNORMAL HIGH (ref 11.6–15.2)

## 2013-11-05 LAB — TSH: TSH: 1.52 u[IU]/mL (ref 0.350–4.500)

## 2013-11-05 MED ORDER — PSYLLIUM 95 % PO PACK: 1.0000 | PACK | Freq: Every day | ORAL | Status: DC

## 2013-11-05 MED ORDER — SENNOSIDES-DOCUSATE SODIUM 8.6-50 MG PO TABS
1.0000 | ORAL_TABLET | Freq: Every day | ORAL | Status: DC
  Administered 2013-11-05: 1 via ORAL
  Filled 2013-11-05 (×2): qty 1

## 2013-11-05 MED ORDER — OCUVITE-LUTEIN PO CAPS
1.0000 | ORAL_CAPSULE | Freq: Two times a day (BID) | ORAL | Status: DC
  Administered 2013-11-05: 1 via ORAL
  Filled 2013-11-05 (×6): qty 1

## 2013-11-05 MED ORDER — SODIUM CHLORIDE 0.9 % IV SOLN
25.0000 mg | Freq: Once | INTRAVENOUS | Status: AC
  Administered 2013-11-05: 25 mg via INTRAVENOUS
  Filled 2013-11-05: qty 0.5

## 2013-11-05 MED ORDER — HYDROXYZINE HCL 25 MG PO TABS
25.0000 mg | ORAL_TABLET | Freq: Every day | ORAL | Status: DC
  Administered 2013-11-05: 25 mg via ORAL
  Filled 2013-11-05 (×2): qty 1

## 2013-11-05 MED ORDER — HYDROXYZINE HCL 25 MG PO TABS
25.0000 mg | ORAL_TABLET | Freq: Three times a day (TID) | ORAL | Status: DC | PRN
  Filled 2013-11-05: qty 1

## 2013-11-05 MED ORDER — PRESERVISION AREDS PO CAPS: 1.0000 | ORAL_CAPSULE | Freq: Two times a day (BID) | ORAL | Status: DC

## 2013-11-05 MED ORDER — TIOTROPIUM BROMIDE MONOHYDRATE 18 MCG IN CAPS
18.0000 ug | ORAL_CAPSULE | Freq: Every day | RESPIRATORY_TRACT | Status: DC
Start: 2013-11-05 — End: 2013-11-06
  Filled 2013-11-05 (×2): qty 5

## 2013-11-05 MED ORDER — SENNOSIDES-DOCUSATE SODIUM 8.6-50 MG PO TABS
2.0000 | ORAL_TABLET | Freq: Every day | ORAL | Status: DC
  Administered 2013-11-05: 2 via ORAL
  Filled 2013-11-05 (×2): qty 2

## 2013-11-05 MED ORDER — IRON DEXTRAN 50 MG/ML IJ SOLN
1000.0000 mg | Freq: Once | INTRAMUSCULAR | Status: AC
  Administered 2013-11-05: 1000 mg via INTRAVENOUS
  Filled 2013-11-05: qty 20

## 2013-11-05 NOTE — Consult Note (Signed)
Consultation  Referring Provider: [facility]     Primary Care Physician:  Kandice Hams, MD Primary Gastroenterologist: Verl Blalock, MD        Reason for Consultation: rectal bleeding             HPI:   [name] is a 80 y.o. female with multiple medical problems , on multiple medications including coumadin. She is on 02 at home for COPD. Patient was brought by EMS to ED yesterday for rectal bleeding. She has chronic mild anemia. Baseline hgb around 11, down to 8.9 yesterday. No diarrhea, in fact no increase in number of BMs since bleeding started yesterday. Patient reports about 4 total BMs with blood yesterday. BM this am contained less blood per patient. No abdominal pain, just 'full".   Patient takes daily Miralax and frequently takes Senakot as well. Still has to occasionally strain.  Past Medical History  Diagnosis Date  . Cholelithiases   . Hypothyroid   . Leg paresthesia   . Aortic valve stenosis   . Arthritis   . Hyperlipidemia   . Dementia   . Carotid artery occlusion   . Renal disorder   . Hypertension   . COPD (chronic obstructive pulmonary disease)   . SEIZURE DISORDER 11/05/2006  . GERD 11/05/2006  . CVA 02/07/2007  . Macular degeneration of both eyes   . Blind in both eyes   . Peripheral neuropathy   . Pneumonia     "many times"  . Internal hemorrhoid, bleeding   . Malaria     "as a child"  . Skin cancer     "nose, face mostly"  . Chronic lower back pain     "when I go to the bathroom"  . Intestinal infection due to Clostridium difficile 09/2011  . Benign neoplasm of breast   . Gout, unspecified   . Anemia   . Anxiety   . Other dysfunctions of sleep stages or arousal from sleep   . Depression   . Chronic rhinitis   . Abscess of lung(513.0)     RUL  . Diaphragmatic hernia without mention of obstruction or gangrene   . Diverticulosis of colon (without mention of hemorrhage)   . Acute duodenal ulcer with perforation and  obstruction 08/2011    perforation  . Hemorrhage of rectum and anus   . Abscess of intestine 09/2011    drainage  . Other psoriasis   . Lumbago   . Nonspecific elevation of levels of transaminase or lactic acid dehydrogenase (LDH)   . Solitary pulmonary nodule     RLL nodule 1 cm  . Transient ischemic attack (TIA), and cerebral infarction without residual deficits(V12.54)   . Long term (current) use of anticoagulants   . Sacroiliitis 08/27/2012  . Anemia, iron deficiency 10/16/2011  . Congestive heart failure   . Spondylosis of lumbosacral region 09/24/2012    L/S xray 08/2012: Mild to moderate osteoarthritis and degenerative spondylosis he diffusely. Anterior subluxation of L4 relative to L5 at 5.7 cm. Mild to moderate levoconvex curvature the mid lumbar spine    . Dysrhythmia     hx atrial fib  . Arteritis     hx giant cell arteritis    Past Surgical History  Procedure Laterality Date  . Vesicovaginal fistula closure w/ tah    . Tonsillectomy    . Breast lumpectomy      bilaterally  . Cataract extraction w/ intraocular lens  implant, bilateral    .  Skin cancer excision      "nose and face"  . Abdominal hysterectomy    . Hemorrhoid surgery  11/2008    banding    Family History  Problem Relation Age of Onset  . Hyperlipidemia    . Hypertension    . Stroke    . Colon cancer Daughter   . Heart attack Father   . Kidney failure Father   . Heart disease Son      History  Substance Use Topics  . Smoking status: Former Smoker -- 1.00 packs/day for 50 years    Types: Cigarettes    Quit date: 08/08/1993  . Smokeless tobacco: Never Used  . Alcohol Use: Yes     Comment: 10/15/11 "have a drink when I go out to dinner; don't go out often"    Prior to Admission medications   Medication Sig Start Date End Date Taking? Authorizing Provider  acetaminophen (TYLENOL) 325 MG tablet Take 650 mg by mouth every 6 (six) hours as needed (For pain.).    Yes Historical Provider, MD    acetaminophen (TYLENOL) 325 MG tablet Take 650 mg by mouth every morning.   Yes Historical Provider, MD  acyclovir ointment (ZOVIRAX) 5 % Apply 1 application topically every 3 (three) hours as needed. For cold sores   Yes Historical Provider, MD  albuterol (PROVENTIL HFA;VENTOLIN HFA) 108 (90 BASE) MCG/ACT inhaler Inhale 2 puffs into the lungs every 6 (six) hours as needed for wheezing or shortness of breath.   Yes Historical Provider, MD  amoxicillin (AMOXIL) 500 MG capsule Take 2,000 mg by mouth once. Prior to dental procedure   Yes Historical Provider, MD  augmented betamethasone dipropionate (DIPROLENE-AF) 0.05 % ointment Apply 1 application topically 2 (two) times daily.   Yes Historical Provider, MD  cetirizine (ZYRTEC) 10 MG tablet Take 10 mg by mouth daily.   Yes Historical Provider, MD  chlorhexidine (PERIDEX) 0.12 % solution Use as directed 15 mLs in the mouth or throat. swish 15 mls for 30 sec then spit, twice daily   Yes Historical Provider, MD  Clocortolone Pivalate (CLODERM) 0.1 % cream Apply 1 application topically 2 (two) times daily. Applies to lesion on left cheek.   Yes Historical Provider, MD  coal tar (NEUTROGENA T-GEL) 0.5 % shampoo Apply 1 application topically See admin instructions. She uses on Tuesday and Friday.   Yes Historical Provider, MD  donepezil (ARICEPT) 10 MG tablet Take 10 mg by mouth at bedtime.    Yes Historical Provider, MD  Emollient (CERAVE) CREA Apply 1 application topically daily. Applies to dry skin.   Yes Historical Provider, MD  enalapril (VASOTEC) 5 MG tablet Take 5 mg by mouth every morning.    Yes Historical Provider, MD  escitalopram (LEXAPRO) 20 MG tablet Take 20 mg by mouth every morning.    Yes Historical Provider, MD  furosemide (LASIX) 40 MG tablet Take 40 mg by mouth every morning.  09/10/13  Yes Historical Provider, MD  gabapentin (NEURONTIN) 100 MG capsule Take 200 mg by mouth at bedtime.    Yes Historical Provider, MD   HYDROcodone-acetaminophen (NORCO/VICODIN) 5-325 MG per tablet Take 1-2 tablets by mouth every 6 (six) hours as needed for moderate pain.   Yes Historical Provider, MD  hydrOXYzine (ATARAX/VISTARIL) 25 MG tablet Take 25 mg by mouth at bedtime.   Yes Historical Provider, MD  levothyroxine (SYNTHROID, LEVOTHROID) 88 MCG tablet Take 88 mcg by mouth daily before breakfast.    Yes Historical Provider, MD  Multiple Vitamins-Minerals (PRESERVISION AREDS) CAPS Take 1 capsule by mouth 2 (two) times daily with a meal.    Yes Historical Provider, MD  mupirocin ointment (BACTROBAN) 2 % Place 1 application into the nose daily.   Yes Historical Provider, MD  olopatadine (PATANOL) 0.1 % ophthalmic solution Place 1 drop into both eyes 2 (two) times daily as needed. For dry eyes   Yes Historical Provider, MD  polyethylene glycol (MIRALAX / GLYCOLAX) packet Take 17 g by mouth every morning.    Yes Historical Provider, MD  potassium chloride SA (K-DUR,KLOR-CON) 20 MEQ tablet Take 40 mEq by mouth daily.   Yes Historical Provider, MD  pramoxine-mineral oil-zinc (TUCKS) 1-12.5 % rectal ointment Place 1 application rectally every 2 (two) hours as needed for itching (itching).   Yes Historical Provider, MD  pravastatin (PRAVACHOL) 40 MG tablet Take 1 tablet (40 mg total) by mouth every evening. 03/02/13  Yes Lelon Perla, MD  Propylene Glycol (SYSTANE BALANCE) 0.6 % SOLN Place 1 drop into both eyes daily as needed (For dry or itchy eyes.). One drop both eye as needed   Yes Historical Provider, MD  senna-docusate (SENOKOT S) 8.6-50 MG per tablet Take 1-2 tablets by mouth 2 (two) times daily. She takes one tablet in the morning and two tablets at bedtime.   Yes Historical Provider, MD  tiotropium (SPIRIVA) 18 MCG inhalation capsule Place 18 mcg into inhaler and inhale daily.   Yes Historical Provider, MD  warfarin (COUMADIN) 4 MG tablet Take 4 mg by mouth every Thursday.  07/29/13  Yes Claudette Jeri Cos, NP  warfarin  (COUMADIN) 5 MG tablet Take 5 mg by mouth See admin instructions. She takes on Monday, Tuesday, Wednesday, Friday, Saturday and Sunday. 07/29/13  Yes Claudette Jeri Cos, NP    Current Facility-Administered Medications  Medication Dose Route Frequency Provider Last Rate Last Dose  . 0.9 %  sodium chloride infusion   Intravenous Continuous Threasa Beards, MD 75 mL/hr at 11/05/13 [phone]    . acetaminophen (TYLENOL) tablet 650 mg  650 mg Oral q morning - 10a Eugenie Filler, MD   650 mg at 11/05/13 0916  . acetaminophen (TYLENOL) tablet 650 mg  650 mg Oral Q6H PRN Irine Seal V, MD      . albuterol (PROVENTIL) (2.5 MG/3ML) 0.083% nebulizer solution 2.5 mg  2.5 mg Nebulization Q2H PRN Eugenie Filler, MD      . chlorhexidine (PERIDEX) 0.12 % solution 15 mL  15 mL Mouth/Throat BID Eugenie Filler, MD   15 mL at 11/05/13 0916  . donepezil (ARICEPT) tablet 10 mg  10 mg Oral QHS Eugenie Filler, MD   10 mg at 11/04/13 2158  . enalapril (VASOTEC) tablet 5 mg  5 mg Oral q morning - 10a Eugenie Filler, MD   5 mg at 11/05/13 0916  . escitalopram (LEXAPRO) tablet 20 mg  20 mg Oral q morning - 10a Eugenie Filler, MD   20 mg at 11/05/13 0916  . gabapentin (NEURONTIN) capsule 200 mg  200 mg Oral QHS Eugenie Filler, MD   200 mg at 11/04/13 2158  . gi cocktail (Maalox,Lidocaine,Donnatal)  30 mL Oral TID PRN Eugenie Filler, MD      . HYDROcodone-acetaminophen (NORCO/VICODIN) 5-325 MG per tablet 1-2 tablet  1-2 tablet Oral Q6H PRN Irine Seal V, MD      . ipratropium (ATROVENT) nebulizer solution 0.5 mg  0.5 mg Nebulization Q2H PRN Eugenie Filler, MD      .  levothyroxine (SYNTHROID, LEVOTHROID) tablet 88 mcg  88 mcg Oral QAC breakfast Eugenie Filler, MD   88 mcg at 11/05/13 0845  . loratadine (CLARITIN) tablet 10 mg  10 mg Oral Daily Eugenie Filler, MD   10 mg at 11/05/13 0916  . mupirocin ointment (BACTROBAN) 2 % 1 application  1 application Nasal Daily Irine Seal V, MD      .  olopatadine (PATANOL) 0.1 % ophthalmic solution 1 drop  1 drop Both Eyes BID PRN Eugenie Filler, MD      . ondansetron [facility]) tablet 4 mg  4 mg Oral Q6H PRN Eugenie Filler, MD       Or  . ondansetron [facility]) injection 4 mg  4 mg Intravenous Q6H PRN Eugenie Filler, MD      . oxyCODONE (Oxy IR/ROXICODONE) immediate release tablet 5 mg  5 mg Oral Q4H PRN Eugenie Filler, MD      . pantoprazole (PROTONIX) injection 40 mg  40 mg Intravenous Q24H Eugenie Filler, MD   40 mg at 11/04/13 2200  . polyethylene glycol (MIRALAX / GLYCOLAX) packet 17 g  17 g Oral q morning - 10a Eugenie Filler, MD   17 g at 11/05/13 0916  . polyvinyl alcohol (LIQUIFILM TEARS) 1.4 % ophthalmic solution 1 drop  1 drop Both Eyes PRN Irine Seal V, MD      . simvastatin (ZOCOR) tablet 20 mg  20 mg Oral q1800 Irine Seal V, MD      . sodium chloride 0.9 % injection 3 mL  3 mL Intravenous Q12H Eugenie Filler, MD   3 mL at 11/04/13 2158    Allergies as of 11/04/2013 - Review Complete 11/04/2013  Allergen Reaction Noted  . Cortisone Other (See Comments) 09/15/2011  . Minocin [minocycline hcl] Other (See Comments) 09/15/2011  . Minocycline hcl  11/05/2006  . Neomycin-bacitracin zn-polymyx    . Neosporin [neomycin-bacitracin zn-polymyx] Other (See Comments) 09/15/2011  . Other Other (See Comments) 09/15/2011  . Tetanus toxoids Other (See Comments) 09/15/2011    Review of Systems:    All systems reviewed and negative except where noted in HPI.   Physical Exam:  Vital signs in last 24 hours: Temp:  [97.5 F (36.4 C)-98.3 F (36.8 C)] 97.5 F (36.4 C) (08/13 0601) Pulse Rate:  [61-62] 62 (08/13 0601) Resp:  [15-18] 17 (08/13 0601) BP: (119-162)/(47-65) 119/47 mmHg (08/13 0601) SpO2:  [95 %-100 %] 100 % (08/13 0601) Weight:  [151 lb 9.6 oz (68.765 kg)-156 lb 15.5 oz (71.2 kg)] 151 lb 9.6 oz (68.765 kg) (08/13 0601) Last BM Date: 11/04/13 General:   Pleasant white female in NAD Head:   Normocephalic and atraumatic. Eyes:   No icterus.   Conjunctiva pink. Ears:  Normal auditory acuity. Neck:  Supple; no masses felt Lungs:  O2 per [place]. Respirations even and unlabored. Lungs clear to auscultation bilaterally.   No wheezes, crackles, or rhonchi.  Heart:  Regular rate and rhythm, loud murmur present Abdomen:  Soft, nondistended, nontender. Normal bowel sounds. No appreciable masses or hepatomegaly.  Rectal:  Large external hemorrhoids, scant fresh perianal blood.   Msk:  Symmetrical without gross deformities.  Extremities:  Without edema. Neurologic:  Alert and  oriented x4;  grossly normal neurologically. Skin:  Intact without significant lesions or rashes. Cervical Nodes:  No significant cervical adenopathy. Psych:  Alert and cooperative. Normal affect.  LAB RESULTS:  Recent Labs  11/04/13 1548 11/04/13 2030 11/05/13 0312  WBC  6.7 7.0 5.9  HGB 8.6* 8.5* 8.3*  HCT 29.6* 29.0* 28.5*  PLT 343 309 299   BMET  Recent Labs  11/04/13 1548 11/05/13 0311  NA 138 143  K 4.3 3.7  CL 103 107  CO2 21 22  GLUCOSE 89 87  BUN 17 14  CREATININE 1.24* 1.16*  CALCIUM 9.1 8.6   LFT  Recent Labs  11/04/13 1548  PROT 7.5  ALBUMIN 3.2*  AST 25  ALT 6  ALKPHOS 89  BILITOT 0.3   PT/INR  Recent Labs  11/04/13 1548 11/05/13 0311  LABPROT 23.2* 22.4*  INR 2.06* 1.97*     PREVIOUS ENDOSCOPIES:             Sigmoidoscopy Oct 2010 for hematochezia. Findings: diverticulosis and healing hemorrhoid ligation site.   sigmoidoscopy in 2008 for hematochezia. Findings: diverticulosis and hemorrhoids  Colonoscopy Sept 2005 for hematochezia. Findings: diverticulosis and hemorrhoids.    Impression / Plan:   38. 80 year old female with chronic, intermittent rectal bleeding likely hemorrhoidal. In with recurrent hematochezia on coumadin. Hgb down about 2 grams, MCV is low, ferritin is low normal at 17 so suspect she is iron deficient.  Hgb has remained stable overnight at  8.3. Hopefully hgb will remain stable since bleeding seems to be resolving. Suspect this is hemorrhoidal bleeding though diverticular bleed, ischemia or neoplasm not excluded. She may have chronic GI blood loss given low MCV / ferritin and this would normally be evaluated by endoscopy. Given advanced age and multiple co-morbidities endoscopic workup may not be feasible  2. Multiple medical problems as listed in [place]  3. Chronic anticoagulation  4. External hemorrhoids. She may have internal ones as well. She is allergic to cortisone.   5. Chronic constipation. Takes daily Miralax and senokot. Stools occasionally hard but often normal as well. Would add prn glycerin suppositories at discharge. .   Thanks   LOS: 1 day   Tye Savoy  11/05/2013, 9:19 AM  Attending MD note:   I have taken a history, examined the patient, and reviewed the chart. I agree with the Advanced Practitioner's impression and recommendations. Pt has had multiple episodes of low volume rectal bleeding for past 10 years and has undergone several colonoscopies and flexible sigmoidoscopies  To confirm int and ext hemorrhoids and diverticula. Will not plan to repeat flexible sigmoidoscopy. Please give IV Iron for severe chronic Iron deficiency anemia.Use Metamucil and Anusol HC supp.  [facility] Pager # [phone]

## 2013-11-05 NOTE — Evaluation (Signed)
Physical Therapy Evaluation Patient Details Name: [name] MRN: [id number] DOB: 28-Mar-1919 Today's Date: 11/05/2013   History of Present Illness  80 y.o. female  resident at [facility] independent living facility, with history of macular degeneration and blindness, chronic respiratory failure on home O2, atrial fibrillation and carotid artery occlusion on chronic anticoagulation, history of TIA, aortic valve stenosis, dementia, hypertension, hypothyroidism admitted 11/04/13 with rectal bleeding  Clinical Impression  Pt admitted with GI bleed. Pt currently with functional limitations due to the deficits listed below (see PT Problem List).  Pt will benefit from skilled PT to increase their independence and safety with mobility to allow discharge to the venue listed below.  Pt reported weakness and fatigue today and only agreeable to up to recliner.  Pt reports she does have scooter and w/c to use at home.  Recommend HHPT at independent living facility.     Follow Up Recommendations Home health PT;Supervision - Intermittent    Equipment Recommendations  None recommended by PT    Recommendations for Other Services       Precautions / Restrictions Precautions Precautions: Fall Restrictions Weight Bearing Restrictions: No      Mobility  Bed Mobility Overal bed mobility: Needs Assistance Bed Mobility: Supine to Sit     Supine to sit: Min guard;HOB elevated Sit to supine: Min guard   General bed mobility comments: pt reports increased weakness however no physical assist required  Transfers Overall transfer level: Needs assistance Equipment used: Rolling walker (2 wheeled) Transfers: Sit to/from Stand Sit to Stand: Min guard         General transfer comment: verbal cues hand placement and to take RW back to recliner  Ambulation/Gait Ambulation/Gait assistance:  (pt felt too weak to ambulate)              Stairs            Wheelchair Mobility     Modified Rankin (Stroke Patients Only)       Balance                                             Pertinent Vitals/Pain Pain Assessment: No/denies pain Faces Pain Scale: Hurts a little bit Pain Location: R side of back Pain Descriptors / Indicators: Aching Pain Intervention(s): Repositioned    Home Living     Available Help at Discharge: Available PRN/intermittently Type of Home: Independent living facility Home Access: Level entry     Home Layout: One level Home Equipment: Wheelchair - Chiropodist - 2 wheels Additional Comments: pt reports on home oxygen    Prior Function Level of Independence: Needs assistance   Gait / Transfers Assistance Needed: uses walker in apt and scooter or w/c in community  ADL's / Homemaking Assistance Needed: pt has caregiver 4 hours per day to help with laundry, meals and as needed with ADL. Pt usually can do own bath, dress.   Comments: Pt has higher toilets and grab bars.      Hand Dominance        Extremity/Trunk Assessment   Upper Extremity Assessment: Overall WFL for tasks assessed           Lower Extremity Assessment: RLE deficits/detail;LLE deficits/detail;Generalized weakness RLE Deficits / Details: grossly 3/5 throughout except hip flexion 2+/5 limited by hip pain LLE Deficits / Details: grossly 3/5 throughout except hip flexion 2+/5 limited by  hip pain     Communication   Communication: No difficulties  Cognition Arousal/Alertness: Awake/alert Behavior During Therapy: WFL for tasks assessed/performed Overall Cognitive Status: History of cognitive impairments - at baseline                      General Comments      Exercises        Assessment/Plan    PT Assessment Patient needs continued PT services  PT Diagnosis Difficulty walking   PT Problem List Decreased strength;Decreased activity tolerance;Decreased mobility  PT Treatment Interventions DME  instruction;Gait training;Functional mobility training;Therapeutic activities;Therapeutic exercise;Patient/family education   PT Goals (Current goals can be found in the Care Plan section) Acute Rehab PT Goals Patient Stated Goal: return to independence. PT Goal Formulation: With patient Time For Goal Achievement: 11/12/13 Potential to Achieve Goals: Good    Frequency Min 3X/week   Barriers to discharge        Co-evaluation               End of Session Equipment Utilized During Treatment: Oxygen Activity Tolerance: Patient limited by fatigue Patient left: in chair;with call bell/phone within reach;with chair alarm set Nurse Communication: Mobility status         Time: 6503-5465 PT Time Calculation (min): 8 min   Charges:   PT Evaluation $Initial PT Evaluation Tier I: 1 Procedure PT Treatments $Therapeutic Activity: 8-22 mins   PT G Codes:          Bindi Klomp,KATHrine E 11/05/2013, 2:05 PM Carmelia Bake, PT, DPT 11/05/2013 Pager: [phone]

## 2013-11-05 NOTE — Evaluation (Signed)
Occupational Therapy Evaluation Patient Details Name: Cyera Balboni MRN: [id number] DOB: 18-Sep-1919 Today's Date: 11/05/2013    History of Present Illness Sergio Hobart is a 80 y.o. female  resident at [facility], with history of macular degeneration and blindness, chronic respiratory failure on home O2, atrial fibrillation and carotid artery occlusion on chronic anticoagulation, history of TIA, aortic valve stenosis, dementia, hypertension, hypothyroidism who presents to the ED with a 2 day history of rectal bleeding   Clinical Impression   Pt up to [facility] and back to bed with min assist overall. Caregiver present for session. Pt will benefit from continued OT to progress strength and independence with self care tasks for return to her apartment. Pt has had caregiver assist PTA.    Follow Up Recommendations  Home health OT;Other (comment) (would recommend initially more assist if return to apartment. )    Equipment Recommendations  None recommended by OT    Recommendations for Other Services       Precautions / Restrictions Precautions Precautions: Fall Restrictions Weight Bearing Restrictions: No      Mobility Bed Mobility Overal bed mobility: Needs Assistance Bed Mobility: Sit to Supine       Sit to supine: Min guard      Transfers Overall transfer level: Needs assistance Equipment used: None Transfers: Sit to/from Stand Sit to Stand: Min guard         General transfer comment: verbal cues hand placement.    Balance                                            ADL Overall ADL's : Needs assistance/impaired Eating/Feeding: Independent;Sitting   Grooming: Wash/dry face;Wash/dry hands;Minimal assistance;Standing   Upper Body Bathing: Set up;Supervision/ safety;Sitting   Lower Body Bathing: Minimal assistance;Sit to/from stand   Upper Body Dressing : Supervision/safety;Set up   Lower Body Dressing:  Minimal assistance;Sit to/from stand   Toilet Transfer: Minimal assistance;Stand-pivot;BSC   Toileting- Clothing Manipulation and Hygiene: Minimal assistance;Sit to/from stand         General ADL Comments: Pt's caregiver present for session. She usually picks out pt's clothes for her but then pt able to dress herself usually. She states she wears O2 at night. Usually uses the walker to transfer around the house and into the bathroom. Note small amount of blood on toilet tissues after Bm today and informed nursing.      Vision                     Perception     Praxis      Pertinent Vitals/Pain Pain Assessment: No/denies pain Faces Pain Scale: Hurts a little bit Pain Location: R side of back Pain Descriptors / Indicators: Aching Pain Intervention(s): Repositioned     Hand Dominance     Extremity/Trunk Assessment Upper Extremity Assessment Upper Extremity Assessment: Overall WFL for tasks assessed           Communication Communication Communication: No difficulties   Cognition Arousal/Alertness: Awake/alert Behavior During Therapy: WFL for tasks assessed/performed Overall Cognitive Status: History of cognitive impairments - at baseline                     General Comments       Exercises       Shoulder Instructions      Home Living  Available Help at Discharge: Available PRN/intermittently Type of Home: Independent living facility Home Access: Level entry     Home Layout: One level               Home Equipment: Wheelchair - Chiropodist - 2 wheels   Additional Comments: pt reports on home oxygen      Prior Functioning/Environment Level of Independence: Needs assistance  Gait / Transfers Assistance Needed: uses walker in apt and scooter or w/c in community ADL's / Homemaking Assistance Needed: pt has caregiver 4 hours per day to help with laundry, meals and as needed with ADL. Pt usually can do own bath,  dress.    Comments: Pt has higher toilets and grab bars.     OT Diagnosis: Generalized weakness   OT Problem List: Decreased strength;Decreased knowledge of use of DME or AE   OT Treatment/Interventions: Self-care/ADL training;Patient/family education;Therapeutic activities;DME and/or AE instruction    OT Goals(Current goals can be found in the care plan section) Acute Rehab OT Goals Patient Stated Goal: return to independence. OT Goal Formulation: With patient Time For Goal Achievement: 11/19/13 Potential to Achieve Goals: Good  OT Frequency: Min 2X/week   Barriers to D/C:            Co-evaluation              End of Session    Activity Tolerance: Patient tolerated treatment well Patient left: in bed;with call bell/phone within reach;with bed alarm set;with family/visitor present   Time: 1142-1205 OT Time Calculation (min): 23 min Charges:  OT General Charges $OT Visit: 1 Procedure OT Evaluation $Initial OT Evaluation Tier I: 1 Procedure OT Treatments $Therapeutic Activity: 8-22 mins G-Codes:    Jules Schick [phone] 11/05/2013, 12:37 PM

## 2013-11-05 NOTE — Progress Notes (Signed)
INITIAL NUTRITION ASSESSMENT  DOCUMENTATION CODES Per approved criteria  -Not Applicable   INTERVENTION: -Advance diet as tolerated to Low Sodium -Encourage adequate intake of meals, patient advised to request nutrition supplements [employer]) as needed.   NUTRITION DIAGNOSIS: Inadequate oral intake related to GI bleed as evidenced by full liquid diet.   Goal: Patient will meet >/=90% of estimated nutrition needs  Monitor:  PO intake, weight, labs  Reason for Assessment: Malnutrition screening tool  80 y.o. female  Admitting Dx: GI bleed  ASSESSMENT: 80 year old female resident at [facility] assisted living facility with history of macular degeneration and blindness, chronic respiratory failure, atrial fibrillation, TIA, dementia, hypertension, hypothyroidism, presents to the ED with 2 day history of rectal bleeding.   Patient reports that her appetite is fair, but she typically eats very well (states she eats "too much"). She does complain of rumbling feeling in stomach (gas/constipation), and feels full most of the time.   She has lost about 20 pounds in the last 9 months (12% weight loss), but reports that this was intentional and desired, as patient cut out most sweets from diet. She declines supplements at this time. Diet just advanced today to full liquids. No evidence of subcutaneous fat or muscle depletion.   Height: Ht Readings from Last 1 Encounters:  11/04/13 5\' 4"  (1.626 m)    Weight: Wt Readings from Last 1 Encounters:  11/05/13 151 lb 9.6 oz (68.765 kg)    Ideal Body Weight: 120 pounds  % Ideal Body Weight: 125%  Wt Readings from Last 10 Encounters:  11/05/13 151 lb 9.6 oz (68.765 kg)  11/02/13 157 lb (71.215 kg)  10/29/13 157 lb (71.215 kg)  10/14/13 159 lb (72.122 kg)  09/30/13 155 lb (70.308 kg)  08/26/13 154 lb (69.854 kg)  08/12/13 154 lb (69.854 kg)  07/29/13 154 lb (69.854 kg)  07/04/13 152 lb (68.947 kg)  07/01/13 155 lb (70.308 kg)      Usual Body Weight: 171 pounds  % Usual Body Weight: 88%  BMI:  Body mass index is 26.01 kg/(m^2). Patient is overweight.   Estimated Nutritional Needs: Kcal: 1400-1550 kcal Protein: 75-90 g Fluid: 2.0 L/day  Skin: intact  Diet Order: Full Liquid  EDUCATION NEEDS: -No education needs identified at this time   Intake/Output Summary (Last 24 hours) at 11/05/13 1110 Last data filed at 11/05/13 0943  Gross per 24 hour  Intake 483.75 ml  Output    575 ml  Net -91.25 ml    Last BM: 8/13   Labs:   Recent Labs Lab 11/04/13 1548 11/04/13 2030 11/05/13 0311  NA 138  --  143  K 4.3  --  3.7  CL 103  --  107  CO2 21  --  22  BUN 17  --  14  CREATININE 1.24*  --  1.16*  CALCIUM 9.1  --  8.6  MG  --  1.8  --   GLUCOSE 89  --  87    CBG (last 3)  No results found for this basename: GLUCAP,  in the last 72 hours  Scheduled Meds: . acetaminophen  650 mg Oral q morning - 10a  . chlorhexidine  15 mL Mouth/Throat BID  . donepezil  10 mg Oral QHS  . enalapril  5 mg Oral q morning - 10a  . escitalopram  20 mg Oral q morning - 10a  . gabapentin  200 mg Oral QHS  . hydrOXYzine  25 mg Oral QHS  .  levothyroxine  88 mcg Oral QAC breakfast  . loratadine  10 mg Oral Daily  . multivitamin-lutein  1 capsule Oral BID WC  . mupirocin ointment  1 application Nasal Daily  . pantoprazole (PROTONIX) IV  40 mg Intravenous Q24H  . polyethylene glycol  17 g Oral q morning - 10a  . senna-docusate  1 tablet Oral Daily  . senna-docusate  2 tablet Oral QHS  . simvastatin  20 mg Oral q1800  . sodium chloride  3 mL Intravenous Q12H  . tiotropium  18 mcg Inhalation Daily    Continuous Infusions: . sodium chloride 50 mL/hr (11/05/13 1046)    Past Medical History  Diagnosis Date  . Cholelithiases   . Hypothyroid   . Leg paresthesia   . Aortic valve stenosis   . Arthritis   . Hyperlipidemia   . Dementia   . Carotid artery occlusion   . Renal disorder   . Hypertension   .  COPD (chronic obstructive pulmonary disease)   . SEIZURE DISORDER 11/05/2006  . GERD 11/05/2006  . CVA 02/07/2007  . Macular degeneration of both eyes   . Blind in both eyes   . Peripheral neuropathy   . Pneumonia     "many times"  . Internal hemorrhoid, bleeding   . Malaria     "as a child"  . Skin cancer     "nose, face mostly"  . Chronic lower back pain     "when I go to the bathroom"  . Intestinal infection due to Clostridium difficile 09/2011  . Benign neoplasm of breast   . Gout, unspecified   . Anemia   . Anxiety   . Other dysfunctions of sleep stages or arousal from sleep   . Depression   . Chronic rhinitis   . Abscess of lung(513.0)     RUL  . Diaphragmatic hernia without mention of obstruction or gangrene   . Diverticulosis of colon (without mention of hemorrhage)   . Acute duodenal ulcer with perforation and obstruction 08/2011    perforation  . Hemorrhage of rectum and anus   . Abscess of intestine 09/2011    drainage  . Other psoriasis   . Lumbago   . Nonspecific elevation of levels of transaminase or lactic acid dehydrogenase (LDH)   . Solitary pulmonary nodule     RLL nodule 1 cm  . Transient ischemic attack (TIA), and cerebral infarction without residual deficits(V12.54)   . Long term (current) use of anticoagulants   . Sacroiliitis 08/27/2012  . Anemia, iron deficiency 10/16/2011  . Congestive heart failure   . Spondylosis of lumbosacral region 09/24/2012    L/S xray 08/2012: Mild to moderate osteoarthritis and degenerative spondylosis he diffusely. Anterior subluxation of L4 relative to L5 at 5.7 cm. Mild to moderate levoconvex curvature the mid lumbar spine    . Dysrhythmia     hx atrial fib  . Arteritis     hx giant cell arteritis    Past Surgical History  Procedure Laterality Date  . Vesicovaginal fistula closure w/ tah    . Tonsillectomy    . Breast lumpectomy      bilaterally  . Cataract extraction w/ intraocular lens  implant, bilateral    .  Skin cancer excision      "nose and face"  . Abdominal hysterectomy    . Hemorrhoid surgery  11/2008    banding    Larey Seat, RD, LDN Pager #: [phone] After-Hours Pager #: [phone]

## 2013-11-05 NOTE — Progress Notes (Signed)
Patient had loose BM's x6 today, stool sample for cdiff collected sent to lab.Marland Kitchen

## 2013-11-05 NOTE — Progress Notes (Signed)
MEDICATION RELATED CONSULT NOTE - INITIAL   Pharmacy Consult for Iron Dextran Indication: Iron deficiency anemia  Allergies  Allergen Reactions  . Cortisone Other (See Comments)  . Minocin [Minocycline Hcl] Other (See Comments)    Unknown  . Minocycline Hcl   . Neomycin-Bacitracin Zn-Polymyx   . Neosporin [Neomycin-Bacitracin Zn-Polymyx] Other (See Comments)    Unknown  . Other Other (See Comments)    Steroidal Neuromuscular Blockers  . Tetanus Toxoids Other (See Comments)    Unknown    Patient Measurements: Height: 5\' 4"  (162.6 cm) Weight: 151 lb 9.6 oz (68.765 kg) IBW/kg (Calculated) : 54.7 Adjusted Body Weight:   Vital Signs: Temp: 97.5 F (36.4 C) (08/13 0601) Temp src: Oral (08/13 0601) BP: 119/47 mmHg (08/13 0601) Pulse Rate: 62 (08/13 0601) Intake/Output from previous day: 08/12 0701 - 08/13 0700 In: 483.8 [P.O.:120; I.V.:363.8] Out: 500 [Urine:500] Intake/Output from this shift: Total I/O In: -  Out: 75 [Urine:75]  Labs:  Recent Labs  11/04/13 1548 11/04/13 2030 11/05/13 0311 11/05/13 0312  WBC 6.7 7.0  --  5.9  HGB 8.6* 8.5*  --  8.3*  HCT 29.6* 29.0*  --  28.5*  PLT 343 309  --  299  CREATININE 1.24*  --  1.16*  --   MG  --  1.8  --   --   ALBUMIN 3.2*  --   --   --   PROT 7.5  --   --   --   AST 25  --   --   --   ALT 6  --   --   --   ALKPHOS 89  --   --   --   BILITOT 0.3  --   --   --    Estimated Creatinine Clearance: 29.5 ml/min (by C-G formula based on Cr of 1.16).   Microbiology: Recent Results (from the past 720 hour(s))  MRSA PCR SCREENING     Status: None   Collection Time    11/05/13  2:14 AM      Result Value Ref Range Status   MRSA by PCR NEGATIVE  NEGATIVE Final   Comment:            The GeneXpert MRSA Assay (FDA     approved for NASAL specimens     only), is one component of a     comprehensive MRSA colonization     surveillance program. It is not     intended to diagnose MRSA     infection nor to guide or   monitor treatment for     MRSA infections.    Medical History: Past Medical History  Diagnosis Date  . Cholelithiases   . Hypothyroid   . Leg paresthesia   . Aortic valve stenosis   . Arthritis   . Hyperlipidemia   . Dementia   . Carotid artery occlusion   . Renal disorder   . Hypertension   . COPD (chronic obstructive pulmonary disease)   . SEIZURE DISORDER 11/05/2006  . GERD 11/05/2006  . CVA 02/07/2007  . Macular degeneration of both eyes   . Blind in both eyes   . Peripheral neuropathy   . Pneumonia     "many times"  . Internal hemorrhoid, bleeding   . Malaria     "as a child"  . Skin cancer     "nose, face mostly"  . Chronic lower back pain     "when I go to the bathroom"  .  Intestinal infection due to Clostridium difficile 09/2011  . Benign neoplasm of breast   . Gout, unspecified   . Anemia   . Anxiety   . Other dysfunctions of sleep stages or arousal from sleep   . Depression   . Chronic rhinitis   . Abscess of lung(513.0)     RUL  . Diaphragmatic hernia without mention of obstruction or gangrene   . Diverticulosis of colon (without mention of hemorrhage)   . Acute duodenal ulcer with perforation and obstruction 08/2011    perforation  . Hemorrhage of rectum and anus   . Abscess of intestine 09/2011    drainage  . Other psoriasis   . Lumbago   . Nonspecific elevation of levels of transaminase or lactic acid dehydrogenase (LDH)   . Solitary pulmonary nodule     RLL nodule 1 cm  . Transient ischemic attack (TIA), and cerebral infarction without residual deficits(V12.54)   . Long term (current) use of anticoagulants   . Sacroiliitis 08/27/2012  . Anemia, iron deficiency 10/16/2011  . Congestive heart failure   . Spondylosis of lumbosacral region 09/24/2012    L/S xray 08/2012: Mild to moderate osteoarthritis and degenerative spondylosis he diffusely. Anterior subluxation of L4 relative to L5 at 5.7 cm. Mild to moderate levoconvex curvature the mid lumbar spine     . Dysrhythmia     hx atrial fib  . Arteritis     hx giant cell arteritis   Assessment: 89 yoF on chronic coumadin for Afib admitted with LGIB likely 2/2 hemorrhoids vs diverticulitis.  Hgb low, stable this AM at 8.3.  INR tx on admission, today slightly subtx and coumadin remains on hold.  Pharmacy consulted to dose IV iron for iron deficiency anemia.  Pt intolerant to PO iron. Iron deficit calculated as 1160mg  (using goal hgb 12).   Goal of Therapy:  Improve iron stores, hgb  Plan:  Iron dextran text dose, if tolerated, then proceed with iron dextran 1g IV over 4-6 hours F/u CBC, clinical status  Ralene Bathe, PharmD, BCPS 11/05/2013, 11:21 AM  Pager: [phone]

## 2013-11-05 NOTE — Progress Notes (Signed)
Patient had a medium soft brown bowel movement with a tinged of bright red blood.

## 2013-11-05 NOTE — Progress Notes (Signed)
  Echocardiogram 2D Echocardiogram has been performed.  Diamond Nickel 11/05/2013, 4:33 PM

## 2013-11-05 NOTE — Progress Notes (Addendum)
[facility] PROGRESS NOTE  [name] PFX:[id number] DOB: February 09, 1920 DOA: 11/04/2013 PCP: Kandice Hams, MD  Assessment/Plan: #1 GI bleed Likely lower GI bleed likely secondary to hemorrhoids versus diverticula. Bleeding seems to be slowing down. Hemoglobin stable at 8.3. Anemia panel consistent with iron deficiency anemia with iron level of 11 and a ferritin of 17. Advance diet to full liquids. Decrease IV fluids to 50 cc per hour. Continue serial CBCs. GI consultation pending.  #2 acute blood loss anemia/severe iron deficiency anemia Hemoglobin currently at 8.3. Anemia panel consistent with a severe iron deficiency anemia with high level of 11 and a ferritin of 17. Patient states "oral iron tears her stomach up." Will give IV iron.  #3 atrial fibrillation Currently in normal sinus rhythm. Rate is controlled. Coumadin on hold. GI to advise when Coumadin can be resumed.  #4. Hypertension Continue enalapril.  #5 history of aortic stenosis Patient had complaints of worsening weakness which may be likely secondary to problem #1 and 2. 2-D echo pending.  #6 hypothyroidism TSH 1.520. Continue synthroid.  #7 carotid artery occlusion Patient was on Coumadin however this has been held secondary to problem #1. GI to advise when Coumadin can be resumed.  #8 chronic respiratory failure/COPD Stable. Continue oxygen and Spiriva.  #9 history of CHF Stable. Patient currently euvolemic. Monitor volume status closely. Decrease IV fluids to 50 cc per hour. 2-D echo is pending. Continue enalapril and statin. Lasix on hold.  #10 gastroesophageal reflux disease PPI.  #11 history of TIAs Coumadin on hold secondary to problem #1.  #12 chronic kidney disease Stable.  #13 prophylaxis PPI for GI prophylaxis. SCDs for DVT prophylaxis.  Code Status: DO NOT RESUSCITATE Family Communication: Updated patient no family at bedside. Disposition Plan: Back to assisted living facility when  medically stable.   Consultants:  GI: Pending  Procedures:  None  Antibiotics:  None  HPI/Subjective: Patient stated that had one bowel movement this morning with less blood. No other complaints.  Objective: Filed Vitals:   11/05/13 0601  BP: 119/47  Pulse: 62  Temp: 97.5 F (36.4 C)  Resp: 17    Intake/Output Summary (Last 24 hours) at 11/05/13 1040 Last data filed at 11/05/13 0943  Gross per 24 hour  Intake 483.75 ml  Output    575 ml  Net -91.25 ml   Filed Weights   11/04/13 1900 11/05/13 0601  Weight: 71.2 kg (156 lb 15.5 oz) 68.765 kg (151 lb 9.6 oz)    Exam:   General:  NAD  Cardiovascular: RRR with 3/6 murmur in LLSB  Respiratory: CTAB  Abdomen: Soft, nontender, nondistended, positive bowel sounds.  Musculoskeletal: No clubbing cyanosis or edema   Data Reviewed: Basic Metabolic Panel:  Recent Labs Lab 11/04/13 1548 11/04/13 2030 11/05/13 0311  NA 138  --  143  K 4.3  --  3.7  CL 103  --  107  CO2 21  --  22  GLUCOSE 89  --  87  BUN 17  --  14  CREATININE 1.24*  --  1.16*  CALCIUM 9.1  --  8.6  MG  --  1.8  --    Liver Function Tests:  Recent Labs Lab 11/04/13 1548  AST 25  ALT 6  ALKPHOS 89  BILITOT 0.3  PROT 7.5  ALBUMIN 3.2*   No results found for this basename: LIPASE, AMYLASE,  in the last 168 hours No results found for this basename: AMMONIA,  in the last 168 hours CBC:  Recent Labs Lab 11/04/13 1548 11/04/13 2030 11/05/13 0312  WBC 6.7 7.0 5.9  NEUTROABS 3.6  --   --   HGB 8.6* 8.5* 8.3*  HCT 29.6* 29.0* 28.5*  MCV 74.2* 73.2* 74.2*  PLT 343 309 299   Cardiac Enzymes: No results found for this basename: CKTOTAL, CKMB, CKMBINDEX, TROPONINI,  in the last 168 hours BNP (last 3 results) No results found for this basename: PROBNP,  in the last 8760 hours CBG: No results found for this basename: GLUCAP,  in the last 168 hours  Recent Results (from the past 240 hour(s))  MRSA PCR SCREENING     Status:  None   Collection Time    11/05/13  2:14 AM      Result Value Ref Range Status   MRSA by PCR NEGATIVE  NEGATIVE Final   Comment:            The GeneXpert MRSA Assay (FDA     approved for NASAL specimens     only), is one component of a     comprehensive MRSA colonization     surveillance program. It is not     intended to diagnose MRSA     infection nor to guide or     monitor treatment for     MRSA infections.     Studies: No results found.  Scheduled Meds: . acetaminophen  650 mg Oral q morning - 10a  . chlorhexidine  15 mL Mouth/Throat BID  . donepezil  10 mg Oral QHS  . enalapril  5 mg Oral q morning - 10a  . escitalopram  20 mg Oral q morning - 10a  . gabapentin  200 mg Oral QHS  . levothyroxine  88 mcg Oral QAC breakfast  . loratadine  10 mg Oral Daily  . mupirocin ointment  1 application Nasal Daily  . pantoprazole (PROTONIX) IV  40 mg Intravenous Q24H  . polyethylene glycol  17 g Oral q morning - 10a  . simvastatin  20 mg Oral q1800  . sodium chloride  3 mL Intravenous Q12H   Continuous Infusions: . sodium chloride 75 mL/hr at 11/05/13 0630    Principal Problem:   GI bleed Active Problems:   Chronic rhinitis   COPD   Chronic respiratory failure   Anemia, iron deficiency   CONGESTIVE HEART FAILURE   HYPOTHYROIDISM   Long term (current) use of anticoagulants   Carotid artery occlusion   Sacroiliitis   Spondylosis of lumbosacral region   Transient ischemic attack (TIA), and cerebral infarction without residual deficits(V12.54)   Essential hypertension   Hyperlipidemia   Aortic stenosis   Rectal bleeding   Acute blood loss anemia   GIB (gastrointestinal bleeding)    Time spent: 83 mins    [facility] MD [facility] Pager [phone]. If 7PM-7AM, please contact night-coverage at [email or web address], password [facility] 11/05/2013, 10:40 AM  LOS: 1 day

## 2013-11-06 ENCOUNTER — Encounter (HOSPITAL_COMMUNITY): Payer: Self-pay | Admitting: Internal Medicine

## 2013-11-06 LAB — CBC
HCT: 28.6 % — ABNORMAL LOW (ref 36.0–46.0)
Hemoglobin: 8.2 g/dL — ABNORMAL LOW (ref 12.0–15.0)
MCH: 21.6 pg — ABNORMAL LOW (ref 26.0–34.0)
MCHC: 28.7 g/dL — ABNORMAL LOW (ref 30.0–36.0)
MCV: 75.5 fL — ABNORMAL LOW (ref 78.0–100.0)
PLATELETS: 299 10*3/uL (ref 150–400)
RBC: 3.79 MIL/uL — ABNORMAL LOW (ref 3.87–5.11)
RDW: 17.6 % — ABNORMAL HIGH (ref 11.5–15.5)
WBC: 5.7 10*3/uL (ref 4.0–10.5)

## 2013-11-06 LAB — BASIC METABOLIC PANEL
ANION GAP: 14 (ref 5–15)
BUN: 10 mg/dL (ref 6–23)
CALCIUM: 8.8 mg/dL (ref 8.4–10.5)
CHLORIDE: 108 meq/L (ref 96–112)
CO2: 20 mEq/L (ref 19–32)
Creatinine, Ser: 1.08 mg/dL (ref 0.50–1.10)
GFR calc Af Amer: 50 mL/min — ABNORMAL LOW (ref >90)
GFR calc non Af Amer: 43 mL/min — ABNORMAL LOW (ref >90)
Glucose, Bld: 92 mg/dL (ref 70–99)
Potassium: 3.5 mEq/L — ABNORMAL LOW (ref 3.7–5.3)
SODIUM: 142 meq/L (ref 137–147)

## 2013-11-06 LAB — CLOSTRIDIUM DIFFICILE BY PCR: CDIFFPCR: NEGATIVE

## 2013-11-06 MED ORDER — HYDROCODONE-ACETAMINOPHEN 5-325 MG PO TABS: 1.0000 | ORAL_TABLET | Freq: Four times a day (QID) | ORAL | Status: DC | PRN

## 2013-11-06 MED ORDER — POTASSIUM CHLORIDE CRYS ER 20 MEQ PO TBCR
40.0000 meq | EXTENDED_RELEASE_TABLET | Freq: Once | ORAL | Status: AC
  Administered 2013-11-06: 40 meq via ORAL
  Filled 2013-11-06: qty 2

## 2013-11-06 MED ORDER — WARFARIN - PHARMACIST DOSING INPATIENT: Freq: Every day | Status: DC

## 2013-11-06 MED ORDER — FUROSEMIDE 40 MG PO TABS: 40.0000 mg | ORAL_TABLET | Freq: Every morning | ORAL | Status: DC

## 2013-11-06 MED ORDER — WARFARIN SODIUM 4 MG PO TABS
4.0000 mg | ORAL_TABLET | Freq: Once | ORAL | Status: DC
  Filled 2013-11-06: qty 1

## 2013-11-06 NOTE — Progress Notes (Signed)
ANTICOAGULATION CONSULT NOTE - Initial Consult  Pharmacy Consult for warfarin Indication: atrial fibrillation  Allergies  Allergen Reactions  . Cortisone Other (See Comments)  . Minocin [Minocycline Hcl] Other (See Comments)    Unknown  . Minocycline Hcl   . Neomycin-Bacitracin Zn-Polymyx   . Neosporin [Neomycin-Bacitracin Zn-Polymyx] Other (See Comments)    Unknown  . Other Other (See Comments)    Steroidal Neuromuscular Blockers  . Tetanus Toxoids Other (See Comments)    Unknown    Patient Measurements: Height: 5\' 4"  (162.6 cm) Weight: 156 lb 1.6 oz (70.806 kg) IBW/kg (Calculated) : 54.7 Heparin Dosing Weight:   Vital Signs: Temp: 97.5 F (36.4 C) (08/14 0506) Temp src: Oral (08/14 0506) BP: 146/64 mmHg (08/14 0631) Pulse Rate: 57 (08/14 0631)  Labs:  Recent Labs  11/04/13 1548  11/05/13 0311 11/05/13 0312 11/05/13 1629 11/06/13 0529  HGB 8.6*  < >  --  8.3* 8.3* 8.2*  HCT 29.6*  < >  --  28.5* 28.8* 28.6*  PLT 343  < >  --  299 331 299  LABPROT 23.2*  --  22.4*  --   --   --   INR 2.06*  --  1.97*  --   --   --   CREATININE 1.24*  --  1.16*  --   --  1.08  < > = values in this interval not displayed.  Estimated Creatinine Clearance: 32.1 ml/min (by C-G formula based on Cr of 1.08).   Medical History: Past Medical History  Diagnosis Date  . Cholelithiases   . Hypothyroid   . Leg paresthesia   . Aortic valve stenosis   . Arthritis   . Hyperlipidemia   . Dementia   . Carotid artery occlusion   . Renal disorder   . Hypertension   . COPD (chronic obstructive pulmonary disease)   . SEIZURE DISORDER 11/05/2006  . GERD 11/05/2006  . CVA 02/07/2007  . Macular degeneration of both eyes   . Blind in both eyes   . Peripheral neuropathy   . Pneumonia     "many times"  . Internal hemorrhoid, bleeding   . Malaria     "as a child"  . Skin cancer     "nose, face mostly"  . Chronic lower back pain     "when I go to the bathroom"  . Intestinal infection  due to Clostridium difficile 09/2011  . Benign neoplasm of breast   . Gout, unspecified   . Anemia   . Anxiety   . Other dysfunctions of sleep stages or arousal from sleep   . Depression   . Chronic rhinitis   . Abscess of lung(513.0)     RUL  . Diaphragmatic hernia without mention of obstruction or gangrene   . Diverticulosis of colon (without mention of hemorrhage)   . Acute duodenal ulcer with perforation and obstruction 08/2011    perforation  . Hemorrhage of rectum and anus   . Abscess of intestine 09/2011    drainage  . Other psoriasis   . Lumbago   . Nonspecific elevation of levels of transaminase or lactic acid dehydrogenase (LDH)   . Solitary pulmonary nodule     RLL nodule 1 cm  . Transient ischemic attack (TIA), and cerebral infarction without residual deficits(V12.54)   . Long term (current) use of anticoagulants   . Sacroiliitis 08/27/2012  . Anemia, iron deficiency 10/16/2011  . Congestive heart failure   . Spondylosis of lumbosacral region 09/24/2012  L/S xray 08/2012: Mild to moderate osteoarthritis and degenerative spondylosis he diffusely. Anterior subluxation of L4 relative to L5 at 5.7 cm. Mild to moderate levoconvex curvature the mid lumbar spine    . Dysrhythmia     hx atrial fib  . Arteritis     hx giant cell arteritis  . Aortic stenosis, severe 03/02/2013    Followed since 2004 with mild aortic valve stenosis and mild regurgitation. Echocardiograms 2006-2013 show  progression from mild to moderate aortic stenosis, mild insufficiency. Mild to moderate mitral valve regurgitation evident in 2013.   07/25/11: Worsening valve disease by echocardiogram, pt is not interested in invasive treatment. Symptoms appear stable for now. Continue to monitor       Assessment: 15 YOF admitted with possible GIB, patient has had further BMs during hospitalization without evidence of ongoing GIB.  GIB mostly attributed to hemorrhoids.  INR at admit was 2.06. Orders to resume warfarin  8/14.   Home warfarin dosing: 5mg  M/Tu/W/F/Sa/Su and 4mg  on Thur  INR 8/13 = 1.97  CBC: Hgb = 8.2 (stable), Pltc WNL  Diet: heart healthy - resumed 8/14   Goal of Therapy:  INR 2-3   Plan:   Warfarin 4mg  PO x 1 tonight (will use lower dose tonight d/t being NPO until today)  Daily INR  Suspect can resume home dosing and monitor INR  Doreene Eland, PharmD, BCPS.   Pager: [phone]  11/06/2013,10:55 AM

## 2013-11-06 NOTE — Progress Notes (Signed)
Pt discharged from unit, left via wheelchair. Dominga Ferry, RN

## 2013-11-06 NOTE — Progress Notes (Signed)
    Progress Note   Subjective  feels okay. Had diarrhea yesterday.    Objective   Vital signs in last 24 hours: Temp:  [97.5 F (36.4 C)-97.9 F (36.6 C)] 97.5 F (36.4 C) (08/14 0506) Pulse Rate:  [57-76] 57 (08/14 0631) Resp:  [15-18] 18 (08/14 0506) BP: (112-177)/(37-76) 146/64 mmHg (08/14 0631) SpO2:  [97 %-98 %] 97 % (08/14 0506) Weight:  [156 lb 1.6 oz (70.806 kg)] 156 lb 1.6 oz (70.806 kg) (08/14 0506) Last BM Date: 11/06/13 General:    white female in NAD Heart:  Regular rate and rhythm, loud murmur Abdomen:  Soft, nontender and nondistended. Normal bowel sounds. Extremities:  Without edema. Neurologic:  Alert and oriented,  grossly normal neurologically. Psych:  Cooperative. Normal mood and affect.  Lab Results:  Recent Labs  11/05/13 0312 11/05/13 1629 11/06/13 0529  WBC 5.9 6.1 5.7  HGB 8.3* 8.3* 8.2*  HCT 28.5* 28.8* 28.6*  PLT 299 331 299   BMET  Recent Labs  11/04/13 1548 11/05/13 0311 11/06/13 0529  NA 138 143 142  K 4.3 3.7 3.5*  CL 103 107 108  CO2 21 22 20   GLUCOSE 89 87 92  BUN 17 14 10   CREATININE 1.24* 1.16* 1.08  CALCIUM 9.1 8.6 8.8   LFT  Recent Labs  11/04/13 1548  PROT 7.5  ALBUMIN 3.2*  AST 25  ALT 6  ALKPHOS 89  BILITOT 0.3   PT/INR  Recent Labs  11/04/13 1548 11/05/13 0311  LABPROT 23.2* 22.4*  INR 2.06* 1.97*      Assessment / Plan:   48. 80 year old female with chronic, intermittent rectal bleeding, likely hemorrhoidal bleeding on coumadin. Several lower endoscopies through the years for rectal bleeding have revealed only hemorrhoids and diverticulosis.This episode of bleeding has nearly resolved.  She will be restarting coumadin. See #4. Stable for discharge from GI standpoint  2. Chronic microcytic anemia. She received IV iron yesterday  3. Chronic anticoagulation   4. External hemorrhoids. She may have internal ones as well. She is allergic to cortisone. If she has recurrent bleeding we could possibly  do outpatient banding in our office.    5. Chronic constipation. Takes daily Miralax and senokot. Stools occasionally hard but often normal as well. Would add prn glycerin suppositories at discharge.She had diarrhea yesterday (after IV iron) for which c-diff was collected. No significant diarrhea this am. Miralax was put on hold    LOS: 2 days   Tye Savoy  11/06/2013, 9:10 AM   Attending MD note:   I have taken a history, examined the patient, and reviewed the chart. I agree with the Advanced Practitioner's impression and recommendations.   [facility] Pager # [phone]

## 2013-11-06 NOTE — Progress Notes (Signed)
Report given to Cheri at [place]. Pt's IV d/c'd. Discharge instructions discussed with pt; pt verbalized understanding of d/c instructions, including meds and follow-up appointments. Telemetry removed. Dominga Ferry, RN

## 2013-11-06 NOTE — Progress Notes (Signed)
Occupational Therapy Treatment Patient Details Name: [name] MRN: [id number] DOB: [birth year] Today's Date: 11/06/2013    History of present illness 80 y.o. female  resident at [facility] independent [facility] facility, with history of macular degeneration and blindness, chronic respiratory failure on home O2, atrial fibrillation and carotid artery occlusion on chronic anticoagulation, history of TIA, aortic valve stenosis, dementia, hypertension, hypothyroidism admitted 11/04/13 with rectal bleeding   OT comments  Pt doing well and able to participate in sponge bath and some dressing tasks. Will continue to follow for OT needs.  Follow Up Recommendations  Home health OT;Other (comment)    Equipment Recommendations  None recommended by OT    Recommendations for Other Services      Precautions / Restrictions Precautions Precautions: Fall Precaution Comments: O2, legally blind Restrictions Weight Bearing Restrictions: No       Mobility Bed Mobility Overal bed mobility: Needs Assistance Bed Mobility: Supine to Sit     Supine to sit: HOB elevated;Min guard        Transfers Overall transfer level: Needs assistance Equipment used: None Transfers: Sit to/from Stand Sit to Stand: Min guard         General transfer comment: verbal cues for hand placement.    Balance                                   ADL       Grooming: Wash/dry face;Set up;Sitting   Upper Body Bathing: Set up;Sitting   Lower Body Bathing: Min guard;Sit to/from stand   Upper Body Dressing : Set up;Sitting   Lower Body Dressing: Min guard;Sit to/from stand Lower Body Dressing Details (indicate cue type and reason): with mesh underwear and pad and socks Toilet Transfer: Min guard;Stand-pivot             General ADL Comments: Pt did well with sponge bathe from EOB sit to stand level. She was able to don mesh underwear over LEs and don socks. Encouraged rest breaks during  bath as she was 2/4 dyspnea and take some deep breaths. Pt able to continue after short rest break. Up to chair to eat breakfast.      Vision                     Perception     Praxis      Cognition   Behavior During Therapy: [facility] for tasks assessed/performed Overall Cognitive Status: Within Functional Limits for tasks assessed                       Extremity/Trunk Assessment               Exercises     Shoulder Instructions       General Comments      Pertinent Vitals/ Pain          Home Living                                          Prior Functioning/Environment              Frequency Min 2X/week     Progress Toward Goals  OT Goals(current goals can now be found in the care plan section)  Progress towards OT goals: Progressing toward goals  Plan Discharge plan remains appropriate    Co-evaluation                 End of Session     Activity Tolerance Patient tolerated treatment well   Patient Left in chair;with call bell/phone within reach;with chair alarm set   Nurse Communication          Time: [phone] OT Time Calculation (min): 28 min  Charges: OT General Charges $OT Visit: 1 Procedure OT Treatments $Self Care/Home Management : 8-22 mins $Therapeutic Activity: 8-22 mins  Jules Schick [phone] 11/06/2013, 10:04 AM

## 2013-11-06 NOTE — Discharge Summary (Signed)
Physician Discharge Summary  Shelie Newman JEH:[id number] DOB: 11-09-1919 DOA: 11/04/2013  PCP: Kandice Hams, MD  Admit date: 11/04/2013 Discharge date: 11/06/2013  Time spent: 65 minutes  Recommendations for Outpatient Follow-up:  1. Patient be discharged to [facility] assisted living facility. Patient is to followup with Kandice Hams, MD one week post discharge. On followup patient will need a CBC done to followup on her hemoglobin. Patient will need a basic metabolic profile done to followup on electrolytes and renal function. 2. Patient is to followup with Dr. Stanford Breed of cardiology in one week post discharge to followup on her severe aortic stenosis. 3. Patient is to followup at the Coumadin clinic on Monday, 11/09/2013  Discharge Diagnoses:  Principal Problem:   GI bleed Active Problems:   Chronic rhinitis   COPD   Chronic respiratory failure   Anemia, iron deficiency   CONGESTIVE HEART FAILURE   HYPOTHYROIDISM   Long term (current) use of anticoagulants   Carotid artery occlusion   Sacroiliitis   Spondylosis of lumbosacral region   Transient ischemic attack (TIA), and cerebral infarction without residual deficits(V12.54)   Essential hypertension   Hyperlipidemia   Aortic stenosis, severe   Rectal bleeding   Acute blood loss anemia   GIB (gastrointestinal bleeding)   Discharge Condition: Stable and improved  Diet recommendation: Heart healthy  Filed Weights   11/04/13 1900 11/05/13 0601 11/06/13 0506  Weight: 71.2 kg (156 lb 15.5 oz) 68.765 kg (151 lb 9.6 oz) 70.806 kg (156 lb 1.6 oz)    History of present illness:  [name] is a 80 y.o. female  Pleasant female resident at [facility] assisted living facility, with history of macular degeneration and blindness, chronic respiratory failure on home O2, atrial fibrillation and carotid artery occlusion on chronic anticoagulation, history of TIA, aortic valve stenosis, dementia, hypertension,  hypothyroidism who presented to the ED with a 2 day history of rectal bleeding. Patient stated blood is bright red blood per rectum associated with bowel movements which have been intermittent. Patient stated this morning had another bowel movement which was bright red. Patient had 2 bloody bowel movements on the day of admission per report from assisted living facility. Patient did endorse some abdominal fullness however denied abdominal pain. Patient complained of some right-sided hip pain. Patient denied any nausea, no vomiting, no fever, no chills, no chest pain, no change in her chronic shortness of breath, no hematemesis, no melena. Patient did endorse some urinary urgency. Patient denied any dysuria. Patient denied any constipation, no diarrhea. Patient did endorse some generalized weakness which is more pronounced than her usual state. Patient stated she used a walker and scooter to get around. Patient stated had similar bleeding before in the past, however was never told what it was from.  Patient was seen in the emergency room for fecal occult blood test which was done was positive. CBC done had a hemoglobin of 8.6 otherwise was within normal limits. Comprehensive metabolic profile the creatinine of 1.24 and albumin of 3.2 otherwise was within normal limits. EKG showed atrial fibrillation with a heart rate of 65.  We were called to admit the patient for further evaluation and management.      Hospital Course:  #1 lower GI bleed/chronic intermittent rectal bleeding Likely secondary to hemorrhoidal bleeding on Coumadin. Patient was admitted to the telemetry floor initially placed on clear liquids, IV fluids. Patient's diuretics were held. Patient's Coumadin was held. Patient had serial CBCs done and was followed. Patient's rectal bleeding improved on  a daily basis. GI was consulted and patient was seen in consultation by Dr. Olevia Perches of [facility] gastroenterology. It was felt this was likely a  hemorrhoidal bleed. It was noted that patient had several endoscopies throughout the years for rectal bleeding that revealed only hemorrhoids and diverticulosis. Patient improved clinically and her diet was advanced. Patient's hemoglobin remained stable. Patient be discharged in stable and improved condition and is to followup with a gastroenterologist as needed. Patient's Coumadin will be resumed on discharge. Patient was discharged in stable and improved condition.  #2 chronic microcytic anemia/acute blood loss anemia/severe iron deficiency anemia Patient was noted to be anemic on admission which was felt to be secondary to problem #1. An anemia panel which was done showed a severe iron deficiency anemia with a iron level less than 10. Patient was given IV iron during the hospitalization. Patient will followup with PCP as outpatient.  #3 chronic constipation Patient was maintained on a home regimen of MiraLAX and Senokot.  #4 atrial fibrillation Patient remained rate controlled during the hospitalization. Patient's Coumadin was held secondary to problem #1. Patient's Coumadin will be resumed on discharge.  #5 history of aortic stenosis Patient had some complaints of some mild weakness on admission which could have been secondary to problem #1. 2-D echo was obtained that showed a severe aortic stenosis. Patient currently stated that she does not want any invasive surgical procedures at this time. Patient denied any chest pain. Patient denied any change in shortness of breath. Patient is to followup with her cardiologist as outpatient one week post discharge.  The rest of patient's chronic medical issues remain stable throughout the hospitalization patient be discharged in stable and improved condition.  Procedures:  2-D echo 11/05/2013  Consultations:  Gastroenterology: Dr. Olevia Perches 11/05/2013  Discharge Exam: Filed Vitals:   11/06/13 1121  BP: 120/65  Pulse:   Temp:   Resp:      General: NAD Cardiovascular: RRR Respiratory: CTAB  Discharge Instructions You were cared for by a hospitalist during your hospital stay. If you have any questions about your discharge medications or the care you received while you were in the hospital after you are discharged, you can call the unit and asked to speak with the hospitalist on call if the hospitalist that took care of you is not available. Once you are discharged, your primary care physician will handle any further medical issues. Please note that NO REFILLS for any discharge medications will be authorized once you are discharged, as it is imperative that you return to your primary care physician (or establish a relationship with a primary care physician if you do not have one) for your aftercare needs so that they can reassess your need for medications and monitor your lab values.      Discharge Instructions   Diet - low sodium heart healthy    Complete by:  As directed      Discharge instructions    Complete by:  As directed   Follow up with POLITE,RONALD D, MD in 1 week. Follow up with Dr Stanford Breed, cardiology in 1 week. Follow up wit GI as needed.     Increase activity slowly    Complete by:  As directed             Medication List         acetaminophen 325 MG tablet  Commonly known as:  TYLENOL  Take 650 mg by mouth every morning.     acetaminophen 325 MG tablet  Commonly known as:  TYLENOL  Take 650 mg by mouth every 6 (six) hours as needed (For pain.).     acyclovir ointment 5 %  Commonly known as:  ZOVIRAX  Apply 1 application topically every 3 (three) hours as needed. For cold sores     albuterol 108 (90 BASE) MCG/ACT inhaler  Commonly known as:  PROVENTIL HFA;VENTOLIN HFA  Inhale 2 puffs into the lungs every 6 (six) hours as needed for wheezing or shortness of breath.     amoxicillin 500 MG capsule  Commonly known as:  AMOXIL  Take 2,000 mg by mouth once. Prior to dental procedure      augmented betamethasone dipropionate 0.05 % ointment  Commonly known as:  DIPROLENE-AF  Apply 1 application topically 2 (two) times daily.     CERAVE Crea  Apply 1 application topically daily. Applies to dry skin.     cetirizine 10 MG tablet  Commonly known as:  ZYRTEC  Take 10 mg by mouth daily.     chlorhexidine 0.12 % solution  Commonly known as:  PERIDEX  Use as directed 15 mLs in the mouth or throat. swish 15 mls for 30 sec then spit, twice daily     CLODERM 0.1 % cream  Generic drug:  Clocortolone Pivalate  Apply 1 application topically 2 (two) times daily. Applies to lesion on left cheek.     coal tar 0.5 % shampoo  Commonly known as:  NEUTROGENA T-GEL  Apply 1 application topically See admin instructions. She uses on Tuesday and Friday.     donepezil 10 MG tablet  Commonly known as:  ARICEPT  Take 10 mg by mouth at bedtime.     enalapril 5 MG tablet  Commonly known as:  VASOTEC  Take 5 mg by mouth every morning.     escitalopram 20 MG tablet  Commonly known as:  LEXAPRO  Take 20 mg by mouth every morning.     furosemide 40 MG tablet  Commonly known as:  LASIX  Take 1 tablet (40 mg total) by mouth every morning.  Start taking on:  11/07/2013     gabapentin 100 MG capsule  Commonly known as:  NEURONTIN  Take 200 mg by mouth at bedtime.     HYDROcodone-acetaminophen 5-325 MG per tablet  Commonly known as:  NORCO/VICODIN  Take 1-2 tablets by mouth every 6 (six) hours as needed for moderate pain.     hydrOXYzine 25 MG tablet  Commonly known as:  ATARAX/VISTARIL  Take 25 mg by mouth at bedtime.     levothyroxine 88 MCG tablet  Commonly known as:  SYNTHROID, LEVOTHROID  Take 88 mcg by mouth daily before breakfast.     mupirocin ointment 2 %  Commonly known as:  BACTROBAN  Place 1 application into the nose daily.     olopatadine 0.1 % ophthalmic solution  Commonly known as:  PATANOL  Place 1 drop into both eyes 2 (two) times daily as needed. For dry eyes      polyethylene glycol packet  Commonly known as:  MIRALAX / GLYCOLAX  Take 17 g by mouth every morning.     potassium chloride SA 20 MEQ tablet  Commonly known as:  K-DUR,KLOR-CON  Take 40 mEq by mouth daily.     pramoxine-mineral oil-zinc 1-12.5 % rectal ointment  Commonly known as:  TUCKS  Place 1 application rectally every 2 (two) hours as needed for itching (itching).     pravastatin 40 MG tablet  Commonly known as:  PRAVACHOL  Take 1 tablet (40 mg total) by mouth every evening.     PRESERVISION AREDS Caps  Take 1 capsule by mouth 2 (two) times daily with a meal.     SENOKOT S 8.6-50 MG per tablet  Generic drug:  senna-docusate  Take 1-2 tablets by mouth 2 (two) times daily. She takes one tablet in the morning and two tablets at bedtime.     SYSTANE BALANCE 0.6 % Soln  Generic drug:  Propylene Glycol  Place 1 drop into both eyes daily as needed (For dry or itchy eyes.). One drop both eye as needed     tiotropium 18 MCG inhalation capsule  Commonly known as:  SPIRIVA  Place 18 mcg into inhaler and inhale daily.     warfarin 5 MG tablet  Commonly known as:  COUMADIN  Take 5 mg by mouth See admin instructions. She takes on Monday, Tuesday, Wednesday, Friday, Saturday and Sunday.     warfarin 4 MG tablet  Commonly known as:  COUMADIN  Take 4 mg by mouth every Thursday.       Allergies  Allergen Reactions  . Cortisone Other (See Comments)  . Minocin [Minocycline Hcl] Other (See Comments)    Unknown  . Minocycline Hcl   . Neomycin-Bacitracin Zn-Polymyx   . Neosporin [Neomycin-Bacitracin Zn-Polymyx] Other (See Comments)    Unknown  . Other Other (See Comments)    Steroidal Neuromuscular Blockers  . Tetanus Toxoids Other (See Comments)    Unknown   Follow-up Information   Follow up with POLITE,RONALD D, MD. Schedule an appointment as soon as possible for a visit in 1 week.   Specialty:  Internal Medicine   Contact information:   [address] [place] [address] [phone]       Follow up with Kirk Ruths, MD. Schedule an appointment as soon as possible for a visit in 1 week.   Specialty:  Cardiology   Contact information:   [address] [place] [address] [phone]       Follow up with Verl Blalock, MD. (f/u as needed)    Specialty:  Gastroenterology   Contact information:   [address] [place] [address] [phone]       Follow up with Kandice Hams, MD On 11/09/2013. (f/u at PCP office for coumadin check)    Specialty:  Internal Medicine   Contact information:   [address] [place] [address] [phone]        The results of significant diagnostics from this hospitalization (including imaging, microbiology, ancillary and laboratory) are listed below for reference.    Significant Diagnostic Studies: No results found.  Microbiology: Recent Results (from the past 240 hour(s))  URINE CULTURE     Status: None   Collection Time    11/04/13  6:40 PM      Result Value Ref Range Status   Specimen Description URINE, CLEAN CATCH   Final   Special Requests NONE   Final   Culture  Setup Time     Final   Value: 11/04/2013 22:20     Performed at [facility]     Final   Value: 6,000 COLONIES/ML     Performed at [facility]   Culture     Final   Value: INSIGNIFICANT GROWTH     Performed at [facility]   Report Status 11/05/2013  FINAL   Final  MRSA PCR SCREENING     Status: None   Collection Time    11/05/13  2:14 AM      Result Value Ref Range Status   MRSA by PCR NEGATIVE  NEGATIVE Final   Comment:            The GeneXpert MRSA Assay (FDA     approved for NASAL specimens     only), is one component of a     comprehensive MRSA colonization     surveillance program. It is not     intended to diagnose MRSA     infection nor to guide or     monitor treatment for     MRSA infections.  CLOSTRIDIUM  DIFFICILE BY PCR     Status: None   Collection Time    11/05/13  5:21 PM      Result Value Ref Range Status   C difficile by pcr NEGATIVE  NEGATIVE Final   Comment: Performed at [facility]: Basic Metabolic Panel:  Recent Labs Lab 11/04/13 1548 11/04/13 2030 11/05/13 0311 11/06/13 0529  NA 138  --  143 142  K 4.3  --  3.7 3.5*  CL 103  --  107 108  CO2 21  --  22 20  GLUCOSE 89  --  87 92  BUN 17  --  14 10  CREATININE 1.24*  --  1.16* 1.08  CALCIUM 9.1  --  8.6 8.8  MG  --  1.8  --   --    Liver Function Tests:  Recent Labs Lab 11/04/13 1548  AST 25  ALT 6  ALKPHOS 89  BILITOT 0.3  PROT 7.5  ALBUMIN 3.2*   No results found for this basename: LIPASE, AMYLASE,  in the last 168 hours No results found for this basename: AMMONIA,  in the last 168 hours CBC:  Recent Labs Lab 11/04/13 1548 11/04/13 2030 11/05/13 0312 11/05/13 1629 11/06/13 0529  WBC 6.7 7.0 5.9 6.1 5.7  NEUTROABS 3.6  --   --   --   --   HGB 8.6* 8.5* 8.3* 8.3* 8.2*  HCT 29.6* 29.0* 28.5* 28.8* 28.6*  MCV 74.2* 73.2* 74.2* 73.3* 75.5*  PLT 343 309 299 331 299   Cardiac Enzymes: No results found for this basename: CKTOTAL, CKMB, CKMBINDEX, TROPONINI,  in the last 168 hours BNP: BNP (last 3 results) No results found for this basename: PROBNP,  in the last 8760 hours CBG: No results found for this basename: GLUCAP,  in the last 168 hours     Signed:  [facility] MD [facility] 11/06/2013, 11:27 AM

## 2013-11-06 NOTE — Progress Notes (Signed)
Patient is set to discharge back to [place] today. Patient & friend at bedside aware. Discharge packet given to RN, Janett Billow to call report. Patient's friend to transport to ALF. Camille @ [name] aware.   Raynaldo Opitz, [facility] Clinical Social Worker cell #: [phone]

## 2013-11-06 NOTE — Progress Notes (Signed)
CARE MANAGEMENT NOTE 11/06/2013  Patient:  [name], [name]   Account Number:  [id number]  Date Initiated:  11/06/2013  Documentation initiated by:  Gabriel Earing  Subjective/Objective Assessment:   pt admitted GI bleed     Action/Plan:   from ALF   Anticipated DC Date:  11/06/2013   Anticipated DC Plan:  [place] referral  Clinical Social Worker      DC Planning Services  CM consult      Choice offered to / List presented to:             Status of service:  Completed, signed off Medicare Important Message given?  NA - LOS <3 / Initial given by admissions (If response is "NO", the following Medicare IM given date fields will be blank) Date Medicare IM given:   Medicare IM given by:   Date Additional Medicare IM given:   Additional Medicare IM given by:    Discharge Disposition:  [place]  Per UR Regulation:  Reviewed for med. necessity/level of care/duration of stay  If discussed at [place] of Stay Meetings, dates discussed:    Comments:  11/05/13 [place], RN, BSN Pt from [facility], who will provide HHPT/OT services. [place] orders faxed/sent to facility and given to NA.

## 2013-11-10 ENCOUNTER — Telehealth: Payer: Self-pay | Admitting: Pulmonary Disease

## 2013-11-10 NOTE — Telephone Encounter (Signed)
Patient's caregiver called stating the patient has not heard anything from [place] regarding her POC.The order was sent on 11/02/13 to [facility].    I called & spoke with Rodena Piety @[facility] .  She stated to let the patient and caregiver know they would be receiving a call from [place] this afternoon regarding POC.  I advised caregiver & she will call [facility] to give them her cell number to call Kara Mead

## 2013-11-11 NOTE — Telephone Encounter (Signed)
Message created in error

## 2013-11-18 ENCOUNTER — Telehealth: Payer: Self-pay | Admitting: Pulmonary Disease

## 2013-11-18 DIAGNOSIS — J449 Chronic obstructive pulmonary disease, unspecified: Secondary | ICD-10-CM

## 2013-11-18 NOTE — Telephone Encounter (Signed)
Need an order for OCD titration. Order placed. Leafy Ro is aware. [place], CMA

## 2013-11-19 ENCOUNTER — Ambulatory Visit (INDEPENDENT_AMBULATORY_CARE_PROVIDER_SITE_OTHER): Payer: Medicare Other | Admitting: Cardiology

## 2013-11-19 ENCOUNTER — Encounter: Payer: Self-pay | Admitting: Cardiology

## 2013-11-19 VITALS — BP 93/52 | HR 67 | Ht 64.0 in | Wt 147.3 lb

## 2013-11-19 DIAGNOSIS — I359 Nonrheumatic aortic valve disorder, unspecified: Secondary | ICD-10-CM

## 2013-11-19 DIAGNOSIS — I10 Essential (primary) hypertension: Secondary | ICD-10-CM

## 2013-11-19 DIAGNOSIS — I350 Nonrheumatic aortic (valve) stenosis: Secondary | ICD-10-CM

## 2013-11-19 DIAGNOSIS — E785 Hyperlipidemia, unspecified: Secondary | ICD-10-CM

## 2013-11-19 DIAGNOSIS — I509 Heart failure, unspecified: Secondary | ICD-10-CM

## 2013-11-19 DIAGNOSIS — I6529 Occlusion and stenosis of unspecified carotid artery: Secondary | ICD-10-CM

## 2013-11-19 DIAGNOSIS — I4891 Unspecified atrial fibrillation: Secondary | ICD-10-CM | POA: Insufficient documentation

## 2013-11-19 LAB — BASIC METABOLIC PANEL WITH GFR
BUN: 27 mg/dL — ABNORMAL HIGH (ref 6–23)
CALCIUM: 9.2 mg/dL (ref 8.4–10.5)
CHLORIDE: 109 meq/L (ref 96–112)
CO2: 20 meq/L (ref 19–32)
Creat: 1.81 mg/dL — ABNORMAL HIGH (ref 0.50–1.10)
GFR, Est African American: 28 mL/min — ABNORMAL LOW
GFR, Est Non African American: 24 mL/min — ABNORMAL LOW
Glucose, Bld: 80 mg/dL (ref 70–99)
Potassium: 5.3 mEq/L (ref 3.5–5.3)
SODIUM: 138 meq/L (ref 135–145)

## 2013-11-19 MED ORDER — ASPIRIN EC 81 MG PO TBEC: 81.0000 mg | DELAYED_RELEASE_TABLET | Freq: Every day | ORAL | Status: DC

## 2013-11-19 NOTE — Patient Instructions (Signed)
Your physician wants you to follow-up in: [name] will receive a reminder letter in the mail two months in advance. If you don't receive a letter, please call our office to schedule the follow-up appointment.   STOP WARFARIN  START ASPIRIN 30 MG ONCE DAILY  Your physician recommends that you HAVE LAB WORK TODAY

## 2013-11-19 NOTE — Assessment & Plan Note (Signed)
Patient was diagnosed with atrial fibrillation in the hospital. I have reviewed that electrocardiogram and this appears to be sinus rhythm. She is in sinus today. I think risk of Coumadin is high. Will discontinue and treat with aspirin 81 mg daily. I cannot find she has had atrial fibrillation.

## 2013-11-19 NOTE — Assessment & Plan Note (Signed)
Patient appears to be doing recently well from a symptomatic standpoint. She is very frail. I explained the significance of aortic stenosis. She is a no CODE BLUE and would never consider any further procedures. She understands that this will progress.

## 2013-11-19 NOTE — Assessment & Plan Note (Signed)
Continue statin. 

## 2013-11-19 NOTE — Progress Notes (Signed)
HPI: FU congestive heart failure. Carotid Dopplers in February of 2014 showed an occluded left carotid artery and no significant obstruction on the right. Echocardiogram in August 2015 showed LVH, restrictive filling, severe AS, mild AI, severe MR, biatrial enlargement, moderate TR, moderately elevated pulmonary pressures. Recently seen in clinic for worsening edema and lasix increased. Admitted 8/14 with GI bleed; seen by GI and felt possibly related to hemorrhoids. Also with ? atrial fibrillation on ECG. Since DC, She has some dyspnea on exertion which is chronic. She has very limited mobility. No significant chest pain or syncope. No pedal edema.   Current Outpatient Prescriptions  Medication Sig Dispense Refill  . acetaminophen (TYLENOL) 325 MG tablet Take 650 mg by mouth every 6 (six) hours as needed (For pain.).       Marland Kitchen acetaminophen (TYLENOL) 325 MG tablet Take 650 mg by mouth every morning.      Marland Kitchen acyclovir ointment (ZOVIRAX) 5 % Apply 1 application topically every 3 (three) hours as needed. For cold sores      . albuterol (PROVENTIL HFA;VENTOLIN HFA) 108 (90 BASE) MCG/ACT inhaler Inhale 2 puffs into the lungs every 6 (six) hours as needed for wheezing or shortness of breath.      Marland Kitchen amoxicillin (AMOXIL) 500 MG capsule Take 2,000 mg by mouth once. Prior to dental procedure      . augmented betamethasone dipropionate (DIPROLENE-AF) 0.05 % ointment Apply 1 application topically 2 (two) times daily.      . cetirizine (ZYRTEC) 10 MG tablet Take 10 mg by mouth daily.      . chlorhexidine (PERIDEX) 0.12 % solution Use as directed 15 mLs in the mouth or throat. swish 15 mls for 30 sec then spit, twice daily      . Clocortolone Pivalate (CLODERM) 0.1 % cream Apply 1 application topically 2 (two) times daily. Applies to lesion on left cheek.      . coal tar (NEUTROGENA T-GEL) 0.5 % shampoo Apply 1 application topically See admin instructions. She uses on Tuesday and Friday.      . donepezil  (ARICEPT) 10 MG tablet Take 10 mg by mouth at bedtime.       . Emollient (CERAVE) CREA Apply 1 application topically daily. Applies to dry skin.      Marland Kitchen enalapril (VASOTEC) 5 MG tablet Take 5 mg by mouth every morning.       . escitalopram (LEXAPRO) 20 MG tablet Take 20 mg by mouth every morning.       . furosemide (LASIX) 40 MG tablet Take 1 tablet (40 mg total) by mouth every morning.  30 tablet  0  . gabapentin (NEURONTIN) 100 MG capsule Take 200 mg by mouth at bedtime.       Marland Kitchen HYDROcodone-acetaminophen (NORCO/VICODIN) 5-325 MG per tablet Take 1-2 tablets by mouth every 6 (six) hours as needed for moderate pain.  20 tablet  0  . hydrOXYzine (ATARAX/VISTARIL) 25 MG tablet Take 25 mg by mouth at bedtime.      Marland Kitchen levothyroxine (SYNTHROID, LEVOTHROID) 88 MCG tablet Take 88 mcg by mouth daily before breakfast.       . Multiple Vitamins-Minerals (PRESERVISION AREDS) CAPS Take 1 capsule by mouth 2 (two) times daily with a meal.       . mupirocin ointment (BACTROBAN) 2 % Place 1 application into the nose daily.      Marland Kitchen olopatadine (PATANOL) 0.1 % ophthalmic solution Place 1 drop into both eyes 2 (two) times  daily as needed. For dry eyes      . polyethylene glycol (MIRALAX / GLYCOLAX) packet Take 17 g by mouth every morning.       . potassium chloride SA (K-DUR,KLOR-CON) 20 MEQ tablet Take 40 mEq by mouth daily.      . pramoxine-mineral oil-zinc (TUCKS) 1-12.5 % rectal ointment Place 1 application rectally every 2 (two) hours as needed for itching (itching).      . pravastatin (PRAVACHOL) 40 MG tablet Take 1 tablet (40 mg total) by mouth every evening.  90 tablet  3  . Propylene Glycol (SYSTANE BALANCE) 0.6 % SOLN Place 1 drop into both eyes daily as needed (For dry or itchy eyes.). One drop both eye as needed      . senna-docusate (SENOKOT S) 8.6-50 MG per tablet Take 1-2 tablets by mouth 2 (two) times daily. She takes one tablet in the morning and two tablets at bedtime.      Marland Kitchen tiotropium (SPIRIVA) 18 MCG  inhalation capsule Place 18 mcg into inhaler and inhale daily.      Marland Kitchen warfarin (COUMADIN) 4 MG tablet Take 4 mg by mouth every Thursday.       . warfarin (COUMADIN) 5 MG tablet Take 5 mg by mouth See admin instructions. She takes on Monday, Tuesday, Wednesday, Friday, Saturday and Sunday.       No current facility-administered medications for this visit.     Past Medical History  Diagnosis Date  . Cholelithiases   . Hypothyroid   . Leg paresthesia   . Aortic valve stenosis   . Arthritis   . Hyperlipidemia   . Dementia   . Carotid artery occlusion   . Renal disorder   . Hypertension   . COPD (chronic obstructive pulmonary disease)   . SEIZURE DISORDER 11/05/2006  . GERD 11/05/2006  . CVA 02/07/2007  . Macular degeneration of both eyes   . Blind in both eyes   . Peripheral neuropathy   . Pneumonia     "many times"  . Internal hemorrhoid, bleeding   . Malaria     "as a child"  . Skin cancer     "nose, face mostly"  . Chronic lower back pain     "when I go to the bathroom"  . Intestinal infection due to Clostridium difficile 09/2011  . Benign neoplasm of breast   . Gout, unspecified   . Anemia   . Anxiety   . Other dysfunctions of sleep stages or arousal from sleep   . Depression   . Chronic rhinitis   . Abscess of lung(513.0)     RUL  . Diaphragmatic hernia without mention of obstruction or gangrene   . Diverticulosis of colon (without mention of hemorrhage)   . Acute duodenal ulcer with perforation and obstruction 08/2011    perforation  . Hemorrhage of rectum and anus   . Abscess of intestine 09/2011    drainage  . Other psoriasis   . Lumbago   . Nonspecific elevation of levels of transaminase or lactic acid dehydrogenase (LDH)   . Solitary pulmonary nodule     RLL nodule 1 cm  . Transient ischemic attack (TIA), and cerebral infarction without residual deficits(V12.54)   . Long term (current) use of anticoagulants   . Sacroiliitis 08/27/2012  . Anemia, iron  deficiency 10/16/2011  . Congestive heart failure   . Spondylosis of lumbosacral region 09/24/2012    L/S xray 08/2012: Mild to moderate osteoarthritis and degenerative spondylosis he  diffusely. Anterior subluxation of L4 relative to L5 at 5.7 cm. Mild to moderate levoconvex curvature the mid lumbar spine    . Dysrhythmia     hx atrial fib  . Arteritis     hx giant cell arteritis  . Aortic stenosis, severe 03/02/2013    Followed since 2004 with mild aortic valve stenosis and mild regurgitation. Echocardiograms 2006-2013 show  progression from mild to moderate aortic stenosis, mild insufficiency. Mild to moderate mitral valve regurgitation evident in 2013.   07/25/11: Worsening valve disease by echocardiogram, pt is not interested in invasive treatment. Symptoms appear stable for now. Continue to monitor       Past Surgical History  Procedure Laterality Date  . Vesicovaginal fistula closure w/ tah    . Tonsillectomy    . Breast lumpectomy      bilaterally  . Cataract extraction w/ intraocular lens  implant, bilateral    . Skin cancer excision      "nose and face"  . Abdominal hysterectomy    . Hemorrhoid surgery  11/2008    banding    History   Social History  . Marital Status: Widowed    Spouse Name: N/A    Number of Children: 3  . Years of Education: N/A   Occupational History  .     Social History Main Topics  . Smoking status: Former Smoker -- 1.00 packs/day for 50 years    Types: Cigarettes    Quit date: 08/08/1993  . Smokeless tobacco: Never Used  . Alcohol Use: Yes     Comment: 10/15/11 "have a drink when I go out to dinner; don't go out often"  . Drug Use: No  . Sexual Activity: No   Other Topics Concern  . Not on file   Social History Narrative   ** Merged History Encounter **        ROS: no fevers or chills, productive cough, hemoptysis, dysphasia, odynophagia, melena, hematochezia, dysuria, hematuria, rash, seizure activity, orthopnea, PND, pedal edema,  claudication. Remaining systems are negative.  Physical Exam: Well-developed frail in no acute distress.  Skin is warm and dry.  HEENT is normal.  Neck is supple.  Chest is clear to auscultation with normal expansion.  Cardiovascular exam is regular rate and rhythm. 3/6 systolic murmur left sternal border. Abdominal exam nontender or distended. No masses palpated. Extremities show no edema. neuro grossly intact      Electrocardiogram today shows sinus rhythm with PACs. Prior anterior infarct.

## 2013-11-19 NOTE — Assessment & Plan Note (Signed)
Given frail body habitus and recent GI bleed I think risk of Coumadin outweighs benefit. We will discontinue Coumadin and treat with aspirin 81 mg daily.

## 2013-11-19 NOTE — Assessment & Plan Note (Signed)
Continue present dose of Lasix. Check potassium and renal function. 

## 2013-11-19 NOTE — Assessment & Plan Note (Signed)
Continue present blood pressure medications. 

## 2014-01-15 ENCOUNTER — Ambulatory Visit
Admission: RE | Admit: 2014-01-15 | Discharge: 2014-01-15 | Disposition: A | Discharge status: Home / Self Care | Payer: Medicare Other | Source: Ambulatory Visit | Attending: Internal Medicine | Admitting: Internal Medicine

## 2014-01-15 ENCOUNTER — Other Ambulatory Visit: Payer: Self-pay | Admitting: Internal Medicine

## 2014-01-15 DIAGNOSIS — R042 Hemoptysis: Secondary | ICD-10-CM

## 2014-01-22 ENCOUNTER — Other Ambulatory Visit: Payer: Self-pay | Admitting: Internal Medicine

## 2014-01-22 ENCOUNTER — Ambulatory Visit
Admission: RE | Admit: 2014-01-22 | Discharge: 2014-01-22 | Disposition: A | Discharge status: Home / Self Care | Payer: Medicare Other | Source: Ambulatory Visit | Attending: Internal Medicine | Admitting: Internal Medicine

## 2014-01-22 DIAGNOSIS — J690 Pneumonitis due to inhalation of food and vomit: Secondary | ICD-10-CM

## 2014-01-25 ENCOUNTER — Emergency Department (HOSPITAL_COMMUNITY): Payer: Medicare Other

## 2014-01-25 ENCOUNTER — Encounter (HOSPITAL_COMMUNITY): Payer: Self-pay | Admitting: Emergency Medicine

## 2014-01-25 ENCOUNTER — Inpatient Hospital Stay (HOSPITAL_COMMUNITY)
Admission: EM | Admit: 2014-01-25 | Discharge: 2014-01-29 | DRG: 178 | Disposition: A | Discharge status: Home / Self Care | Payer: Medicare Other | Attending: Internal Medicine | Admitting: Internal Medicine

## 2014-01-25 DIAGNOSIS — Z8673 Personal history of transient ischemic attack (TIA), and cerebral infarction without residual deficits: Secondary | ICD-10-CM

## 2014-01-25 DIAGNOSIS — Z87891 Personal history of nicotine dependence: Secondary | ICD-10-CM

## 2014-01-25 DIAGNOSIS — R634 Abnormal weight loss: Secondary | ICD-10-CM | POA: Diagnosis present

## 2014-01-25 DIAGNOSIS — I129 Hypertensive chronic kidney disease with stage 1 through stage 4 chronic kidney disease, or unspecified chronic kidney disease: Secondary | ICD-10-CM | POA: Diagnosis present

## 2014-01-25 DIAGNOSIS — N189 Chronic kidney disease, unspecified: Secondary | ICD-10-CM | POA: Diagnosis present

## 2014-01-25 DIAGNOSIS — J189 Pneumonia, unspecified organism: Secondary | ICD-10-CM | POA: Diagnosis present

## 2014-01-25 DIAGNOSIS — N179 Acute kidney failure, unspecified: Secondary | ICD-10-CM | POA: Diagnosis present

## 2014-01-25 DIAGNOSIS — I350 Nonrheumatic aortic (valve) stenosis: Secondary | ICD-10-CM | POA: Diagnosis present

## 2014-01-25 DIAGNOSIS — H3530 Unspecified macular degeneration: Secondary | ICD-10-CM | POA: Diagnosis present

## 2014-01-25 DIAGNOSIS — Z7982 Long term (current) use of aspirin: Secondary | ICD-10-CM | POA: Diagnosis not present

## 2014-01-25 DIAGNOSIS — M109 Gout, unspecified: Secondary | ICD-10-CM | POA: Diagnosis present

## 2014-01-25 DIAGNOSIS — F0390 Unspecified dementia without behavioral disturbance: Secondary | ICD-10-CM | POA: Diagnosis present

## 2014-01-25 DIAGNOSIS — Z515 Encounter for palliative care: Secondary | ICD-10-CM

## 2014-01-25 DIAGNOSIS — Z85828 Personal history of other malignant neoplasm of skin: Secondary | ICD-10-CM

## 2014-01-25 DIAGNOSIS — M545 Low back pain: Secondary | ICD-10-CM | POA: Diagnosis present

## 2014-01-25 DIAGNOSIS — F419 Anxiety disorder, unspecified: Secondary | ICD-10-CM | POA: Diagnosis present

## 2014-01-25 DIAGNOSIS — G40909 Epilepsy, unspecified, not intractable, without status epilepticus: Secondary | ICD-10-CM | POA: Diagnosis present

## 2014-01-25 DIAGNOSIS — H540 Blindness, both eyes: Secondary | ICD-10-CM | POA: Diagnosis present

## 2014-01-25 DIAGNOSIS — J690 Pneumonitis due to inhalation of food and vomit: Principal | ICD-10-CM | POA: Diagnosis present

## 2014-01-25 DIAGNOSIS — J449 Chronic obstructive pulmonary disease, unspecified: Secondary | ICD-10-CM | POA: Diagnosis present

## 2014-01-25 DIAGNOSIS — R042 Hemoptysis: Secondary | ICD-10-CM | POA: Diagnosis present

## 2014-01-25 DIAGNOSIS — K219 Gastro-esophageal reflux disease without esophagitis: Secondary | ICD-10-CM | POA: Diagnosis present

## 2014-01-25 DIAGNOSIS — Z9981 Dependence on supplemental oxygen: Secondary | ICD-10-CM | POA: Diagnosis not present

## 2014-01-25 DIAGNOSIS — J439 Emphysema, unspecified: Secondary | ICD-10-CM

## 2014-01-25 DIAGNOSIS — Z66 Do not resuscitate: Secondary | ICD-10-CM | POA: Diagnosis present

## 2014-01-25 DIAGNOSIS — N289 Disorder of kidney and ureter, unspecified: Secondary | ICD-10-CM

## 2014-01-25 DIAGNOSIS — Z7901 Long term (current) use of anticoagulants: Secondary | ICD-10-CM

## 2014-01-25 DIAGNOSIS — I480 Paroxysmal atrial fibrillation: Secondary | ICD-10-CM | POA: Diagnosis present

## 2014-01-25 DIAGNOSIS — R918 Other nonspecific abnormal finding of lung field: Secondary | ICD-10-CM | POA: Diagnosis present

## 2014-01-25 DIAGNOSIS — E039 Hypothyroidism, unspecified: Secondary | ICD-10-CM | POA: Diagnosis present

## 2014-01-25 DIAGNOSIS — J9610 Chronic respiratory failure, unspecified whether with hypoxia or hypercapnia: Secondary | ICD-10-CM | POA: Diagnosis present

## 2014-01-25 DIAGNOSIS — G629 Polyneuropathy, unspecified: Secondary | ICD-10-CM | POA: Diagnosis present

## 2014-01-25 DIAGNOSIS — F329 Major depressive disorder, single episode, unspecified: Secondary | ICD-10-CM | POA: Diagnosis present

## 2014-01-25 LAB — BASIC METABOLIC PANEL
Anion gap: 15 (ref 5–15)
BUN: 27 mg/dL — ABNORMAL HIGH (ref 6–23)
CALCIUM: 9.2 mg/dL (ref 8.4–10.5)
CO2: 21 meq/L (ref 19–32)
CREATININE: 2.09 mg/dL — AB (ref 0.50–1.10)
Chloride: 105 mEq/L (ref 96–112)
GFR calc Af Amer: 23 mL/min — ABNORMAL LOW (ref >90)
GFR, EST NON AFRICAN AMERICAN: 19 mL/min — AB (ref >90)
GLUCOSE: 108 mg/dL — AB (ref 70–99)
Potassium: 4.5 mEq/L (ref 3.7–5.3)
Sodium: 141 mEq/L (ref 137–147)

## 2014-01-25 LAB — CBC WITH DIFFERENTIAL/PLATELET
Basophils Absolute: 0 10*3/uL (ref 0.0–0.1)
Basophils Relative: 1 % (ref 0–1)
EOS ABS: 0.5 10*3/uL (ref 0.0–0.7)
EOS PCT: 6 % — AB (ref 0–5)
HEMATOCRIT: 37 % (ref 36.0–46.0)
Hemoglobin: 11.6 g/dL — ABNORMAL LOW (ref 12.0–15.0)
LYMPHS ABS: 1.4 10*3/uL (ref 0.7–4.0)
Lymphocytes Relative: 18 % (ref 12–46)
MCH: 28 pg (ref 26.0–34.0)
MCHC: 31.4 g/dL (ref 30.0–36.0)
MCV: 89.4 fL (ref 78.0–100.0)
MONO ABS: 0.6 10*3/uL (ref 0.1–1.0)
Monocytes Relative: 8 % (ref 3–12)
Neutro Abs: 5.2 10*3/uL (ref 1.7–7.7)
Neutrophils Relative %: 67 % (ref 43–77)
Platelets: 258 10*3/uL (ref 150–400)
RBC: 4.14 MIL/uL (ref 3.87–5.11)
RDW: 23.4 % — ABNORMAL HIGH (ref 11.5–15.5)
WBC: 7.7 10*3/uL (ref 4.0–10.5)

## 2014-01-25 LAB — I-STAT CG4 LACTIC ACID, ED: Lactic Acid, Venous: 1.55 mmol/L (ref 0.5–2.2)

## 2014-01-25 MED ORDER — ESCITALOPRAM OXALATE 20 MG PO TABS
20.0000 mg | ORAL_TABLET | Freq: Every morning | ORAL | Status: DC
  Administered 2014-01-26 – 2014-01-29 (×4): 20 mg via ORAL
  Filled 2014-01-25 (×3): qty 1
  Filled 2014-01-25: qty 2
  Filled 2014-01-25: qty 1

## 2014-01-25 MED ORDER — SENNOSIDES-DOCUSATE SODIUM 8.6-50 MG PO TABS
1.0000 | ORAL_TABLET | Freq: Every day | ORAL | Status: DC
  Administered 2014-01-26 – 2014-01-29 (×4): 1 via ORAL
  Filled 2014-01-25 (×4): qty 1

## 2014-01-25 MED ORDER — SODIUM CHLORIDE 0.9 % IV SOLN
INTRAVENOUS | Status: DC
  Administered 2014-01-25: 21:00:00 via INTRAVENOUS

## 2014-01-25 MED ORDER — PIPERACILLIN-TAZOBACTAM 3.375 G IVPB 30 MIN
3.3750 g | Freq: Once | INTRAVENOUS | Status: AC
  Administered 2014-01-25: 3.375 g via INTRAVENOUS
  Filled 2014-01-25: qty 50

## 2014-01-25 MED ORDER — SODIUM CHLORIDE 0.9 % IJ SOLN: 3.0000 mL | INTRAMUSCULAR | Status: DC | PRN

## 2014-01-25 MED ORDER — ACETAMINOPHEN 650 MG RE SUPP: 650.0000 mg | Freq: Four times a day (QID) | RECTAL | Status: DC | PRN

## 2014-01-25 MED ORDER — POTASSIUM CHLORIDE CRYS ER 20 MEQ PO TBCR
40.0000 meq | EXTENDED_RELEASE_TABLET | Freq: Every day | ORAL | Status: DC
  Administered 2014-01-26 – 2014-01-28 (×4): 40 meq via ORAL
  Filled 2014-01-25 (×4): qty 2

## 2014-01-25 MED ORDER — TIOTROPIUM BROMIDE MONOHYDRATE 18 MCG IN CAPS
18.0000 ug | ORAL_CAPSULE | Freq: Every day | RESPIRATORY_TRACT | Status: DC
  Administered 2014-01-26: 18 ug via RESPIRATORY_TRACT
  Filled 2014-01-25: qty 5

## 2014-01-25 MED ORDER — LORATADINE 10 MG PO TABS
10.0000 mg | ORAL_TABLET | Freq: Every day | ORAL | Status: DC
Start: 2014-01-26 — End: 2014-01-29
  Administered 2014-01-26 – 2014-01-29 (×4): 10 mg via ORAL
  Filled 2014-01-25 (×5): qty 1

## 2014-01-25 MED ORDER — ACETAMINOPHEN 325 MG PO TABS: 650.0000 mg | ORAL_TABLET | Freq: Four times a day (QID) | ORAL | Status: DC | PRN

## 2014-01-25 MED ORDER — MORPHINE SULFATE 2 MG/ML IJ SOLN: 1.0000 mg | INTRAMUSCULAR | Status: DC | PRN

## 2014-01-25 MED ORDER — ALBUTEROL SULFATE (2.5 MG/3ML) 0.083% IN NEBU
2.5000 mg | INHALATION_SOLUTION | Freq: Four times a day (QID) | RESPIRATORY_TRACT | Status: DC
  Filled 2014-01-25: qty 3

## 2014-01-25 MED ORDER — SODIUM CHLORIDE 0.9 % IJ SOLN
3.0000 mL | Freq: Two times a day (BID) | INTRAMUSCULAR | Status: DC
  Administered 2014-01-27 – 2014-01-28 (×4): 3 mL via INTRAVENOUS

## 2014-01-25 MED ORDER — SODIUM CHLORIDE 0.9 % IV SOLN: 250.0000 mL | INTRAVENOUS | Status: DC | PRN

## 2014-01-25 MED ORDER — VANCOMYCIN HCL IN DEXTROSE 1-5 GM/200ML-% IV SOLN
1000.0000 mg | INTRAVENOUS | Status: DC
  Administered 2014-01-28: 1000 mg via INTRAVENOUS
  Filled 2014-01-25: qty 200

## 2014-01-25 MED ORDER — PROPYLENE GLYCOL 0.6 % OP SOLN: 1.0000 [drp] | Freq: Every day | OPHTHALMIC | Status: DC | PRN

## 2014-01-25 MED ORDER — POLYETHYLENE GLYCOL 3350 17 G PO PACK
17.0000 g | PACK | Freq: Every morning | ORAL | Status: DC
  Administered 2014-01-27 – 2014-01-29 (×3): 17 g via ORAL
  Filled 2014-01-25 (×5): qty 1

## 2014-01-25 MED ORDER — CEFEPIME HCL 1 G IJ SOLR: 1.0000 g | INTRAMUSCULAR | Status: DC

## 2014-01-25 MED ORDER — ONDANSETRON HCL 4 MG/2ML IJ SOLN: 4.0000 mg | Freq: Four times a day (QID) | INTRAMUSCULAR | Status: DC | PRN

## 2014-01-25 MED ORDER — ALBUTEROL SULFATE (2.5 MG/3ML) 0.083% IN NEBU: 2.5000 mg | INHALATION_SOLUTION | RESPIRATORY_TRACT | Status: DC | PRN

## 2014-01-25 MED ORDER — PRAVASTATIN SODIUM 40 MG PO TABS
40.0000 mg | ORAL_TABLET | Freq: Every evening | ORAL | Status: DC
  Administered 2014-01-26 – 2014-01-28 (×4): 40 mg via ORAL
  Filled 2014-01-25 (×5): qty 1

## 2014-01-25 MED ORDER — LEVOTHYROXINE SODIUM 88 MCG PO TABS
88.0000 ug | ORAL_TABLET | Freq: Every day | ORAL | Status: DC
  Administered 2014-01-26 – 2014-01-29 (×4): 88 ug via ORAL
  Filled 2014-01-25 (×6): qty 1

## 2014-01-25 MED ORDER — POLYVINYL ALCOHOL 1.4 % OP SOLN
1.0000 [drp] | OPHTHALMIC | Status: DC | PRN
  Administered 2014-01-27: 1 [drp] via OPHTHALMIC
  Filled 2014-01-25: qty 15

## 2014-01-25 MED ORDER — VANCOMYCIN HCL IN DEXTROSE 1-5 GM/200ML-% IV SOLN
1000.0000 mg | Freq: Once | INTRAVENOUS | Status: AC
Start: 2014-01-25 — End: 2014-01-26
  Administered 2014-01-26: 1000 mg via INTRAVENOUS
  Filled 2014-01-25: qty 200

## 2014-01-25 MED ORDER — SENNOSIDES-DOCUSATE SODIUM 8.6-50 MG PO TABS
2.0000 | ORAL_TABLET | Freq: Every day | ORAL | Status: DC
  Administered 2014-01-26 – 2014-01-28 (×4): 2 via ORAL
  Filled 2014-01-25 (×4): qty 2

## 2014-01-25 MED ORDER — HYDROCODONE-ACETAMINOPHEN 5-325 MG PO TABS: 1.0000 | ORAL_TABLET | ORAL | Status: DC | PRN

## 2014-01-25 MED ORDER — DEXTROSE 5 % IV SOLN
1.0000 g | Freq: Three times a day (TID) | INTRAVENOUS | Status: DC
  Filled 2014-01-25 (×2): qty 1

## 2014-01-25 MED ORDER — PRAMOXINE-ZINC OXIDE IN MO 1-12.5 % RE OINT
1.0000 "application " | TOPICAL_OINTMENT | RECTAL | Status: DC | PRN
  Filled 2014-01-25: qty 28.3

## 2014-01-25 MED ORDER — ONDANSETRON HCL 4 MG PO TABS
4.0000 mg | ORAL_TABLET | Freq: Four times a day (QID) | ORAL | Status: DC | PRN
Start: 2014-01-25 — End: 2014-01-29

## 2014-01-25 MED ORDER — OLOPATADINE HCL 0.1 % OP SOLN
1.0000 [drp] | Freq: Two times a day (BID) | OPHTHALMIC | Status: DC
  Administered 2014-01-25 – 2014-01-29 (×8): 1 [drp] via OPHTHALMIC
  Filled 2014-01-25: qty 5

## 2014-01-25 MED ORDER — DEXTROSE 5 % IV SOLN
1.0000 g | INTRAVENOUS | Status: DC
  Administered 2014-01-25 – 2014-01-26 (×2): 1 g via INTRAVENOUS
  Filled 2014-01-25 (×3): qty 1

## 2014-01-25 MED ORDER — GABAPENTIN 100 MG PO CAPS
200.0000 mg | ORAL_CAPSULE | Freq: Every day | ORAL | Status: DC
  Administered 2014-01-26 – 2014-01-28 (×4): 200 mg via ORAL
  Filled 2014-01-25 (×6): qty 2

## 2014-01-25 MED ORDER — ZOLPIDEM TARTRATE 5 MG PO TABS: 5.0000 mg | ORAL_TABLET | Freq: Every evening | ORAL | Status: DC | PRN

## 2014-01-25 MED ORDER — MUPIROCIN 2 % EX OINT
1.0000 "application " | TOPICAL_OINTMENT | Freq: Every day | CUTANEOUS | Status: DC
  Administered 2014-01-26 – 2014-01-29 (×4): 1 via NASAL
  Filled 2014-01-25 (×2): qty 22

## 2014-01-25 MED ORDER — IPRATROPIUM BROMIDE 0.02 % IN SOLN
0.5000 mg | Freq: Four times a day (QID) | RESPIRATORY_TRACT | Status: DC
  Filled 2014-01-25: qty 2.5

## 2014-01-25 MED ORDER — DONEPEZIL HCL 10 MG PO TABS
10.0000 mg | ORAL_TABLET | Freq: Every day | ORAL | Status: DC
  Administered 2014-01-26 – 2014-01-28 (×4): 10 mg via ORAL
  Filled 2014-01-25 (×6): qty 1

## 2014-01-25 NOTE — Progress Notes (Signed)
ANTIBIOTIC CONSULT NOTE - INITIAL  Pharmacy Consult for Vancomycin, Cefepime  Indication: pneumonia  Allergies  Allergen Reactions  . Cortisone Other (See Comments)    Rash   . Minocin [Minocycline Hcl] Other (See Comments)    Unknown  . Minocycline Hcl     unknown  . Neomycin-Bacitracin Zn-Polymyx     unknown  . Neosporin [Neomycin-Bacitracin Zn-Polymyx] Other (See Comments)    Unknown  . Other Other (See Comments)    Steroidal Neuromuscular Blockers  . Tetanus Toxoids Other (See Comments)    Unknown    Patient Measurements:   Adjusted Body Weight: n/a   Vital Signs: Temp: 97.7 F (36.5 C) (11/02 1454) Temp Source: Oral (11/02 1454) BP: 148/62 mmHg (11/02 1930) Pulse Rate: 55 (11/02 1930) Intake/Output from previous day:   Intake/Output from this shift:    Labs:  Recent Labs  01/25/14 1502  WBC 7.7  HGB 11.6*  PLT 258  CREATININE 2.09*   CrCl cannot be calculated (Unknown ideal weight.). No results for input(s): VANCOTROUGH, VANCOPEAK, VANCORANDOM, GENTTROUGH, GENTPEAK, GENTRANDOM, TOBRATROUGH, TOBRAPEAK, TOBRARND, AMIKACINPEAK, AMIKACINTROU, AMIKACIN in the last 72 hours.   Microbiology: No results found for this or any previous visit (from the past 720 hour(s)).  Medical History: Past Medical History  Diagnosis Date  . Cholelithiases   . Hypothyroid   . Leg paresthesia   . Aortic valve stenosis   . Arthritis   . Hyperlipidemia   . Dementia   . Carotid artery occlusion   . Renal disorder   . Hypertension   . COPD (chronic obstructive pulmonary disease)   . SEIZURE DISORDER 11/05/2006  . GERD 11/05/2006  . CVA 02/07/2007  . Macular degeneration of both eyes   . Blind in both eyes   . Peripheral neuropathy   . Pneumonia     "many times"  . Internal hemorrhoid, bleeding   . Malaria     "as a child"  . Skin cancer     "nose, face mostly"  . Chronic lower back pain     "when I go to the bathroom"  . Intestinal infection due to Clostridium  difficile 09/2011  . Benign neoplasm of breast   . Gout, unspecified   . Anemia   . Anxiety   . Other dysfunctions of sleep stages or arousal from sleep   . Depression   . Chronic rhinitis   . Abscess of lung(513.0)     RUL  . Diaphragmatic hernia without mention of obstruction or gangrene   . Diverticulosis of colon (without mention of hemorrhage)   . Acute duodenal ulcer with perforation and obstruction 08/2011    perforation  . Hemorrhage of rectum and anus   . Abscess of intestine 09/2011    drainage  . Other psoriasis   . Lumbago   . Nonspecific elevation of levels of transaminase or lactic acid dehydrogenase (LDH)   . Solitary pulmonary nodule     RLL nodule 1 cm  . Transient ischemic attack (TIA), and cerebral infarction without residual deficits(V12.54)   . Long term (current) use of anticoagulants   . Sacroiliitis 08/27/2012  . Anemia, iron deficiency 10/16/2011  . Congestive heart failure   . Spondylosis of lumbosacral region 09/24/2012    L/S xray 08/2012: Mild to moderate osteoarthritis and degenerative spondylosis he diffusely. Anterior subluxation of L4 relative to L5 at 5.7 cm. Mild to moderate levoconvex curvature the mid lumbar spine    . Dysrhythmia     hx atrial fib  .  Arteritis     hx giant cell arteritis  . Aortic stenosis, severe 03/02/2013    Followed since 2004 with mild aortic valve stenosis and mild regurgitation. Echocardiograms 2006-2013 show  progression from mild to moderate aortic stenosis, mild insufficiency. Mild to moderate mitral valve regurgitation evident in 2013.   07/25/11: Worsening valve disease by echocardiogram, pt is not interested in invasive treatment. Symptoms appear stable for now. Continue to monitor       Medications:   (Not in a hospital admission) Assessment: 34 YOF with COPD and know right lower lobe lung mass treated with levaquin for last several days for possible aspiration vs. Postobstructive pneumonia. Pharmacy consulted to dose  vancomycin and cefepime for empiric treatment of pneumonia. She is scheduled to receive a dose of Zosyn in the ED. Pt is afebrile with normal WBC counts. SCr elevated at 2.09. BL SCr seems to be around 1.2. CrCl ~ 15 mL/min.   Goal of Therapy:  Vancomycin trough level 15-20 mcg/ml  Plan:  -Start Vancomycin 1 gm IV Q 48 hours -Cefepime 1 gm IV Q 24 hours.  -Monitor CBC, renal fx, cultures and patient's clinical progress -VT at [facility], PharmD.  Clinical Pharmacist Pager [phone]

## 2014-01-25 NOTE — H&P (Signed)
[facility]                                                                                    Patient Demographics  [name], is a 80 y.o. female  CSN: [id number]  MRN: [id number]  DOB - April 05, 1919  Admit Date - 01/25/2014  Outpatient Primary MD for the patient is Kandice Hams, MD   With History of -  Past Medical History  Diagnosis Date  . Cholelithiases   . Hypothyroid   . Leg paresthesia   . Aortic valve stenosis   . Arthritis   . Hyperlipidemia   . Dementia   . Carotid artery occlusion   . Renal disorder   . Hypertension   . COPD (chronic obstructive pulmonary disease)   . SEIZURE DISORDER 11/05/2006  . GERD 11/05/2006  . CVA 02/07/2007  . Macular degeneration of both eyes   . Blind in both eyes   . Peripheral neuropathy   . Pneumonia     "many times"  . Internal hemorrhoid, bleeding   . Malaria     "as a child"  . Skin cancer     "nose, face mostly"  . Chronic lower back pain     "when I go to the bathroom"  . Intestinal infection due to Clostridium difficile 09/2011  . Benign neoplasm of breast   . Gout, unspecified   . Anemia   . Anxiety   . Other dysfunctions of sleep stages or arousal from sleep   . Depression   . Chronic rhinitis   . Abscess of lung(513.0)     RUL  . Diaphragmatic hernia without mention of obstruction or gangrene   . Diverticulosis of colon (without mention of hemorrhage)   . Acute duodenal ulcer with perforation and obstruction 08/2011    perforation  . Hemorrhage of rectum and anus   . Abscess of intestine 09/2011    drainage  . Other psoriasis   . Lumbago   . Nonspecific elevation of levels of transaminase or lactic acid dehydrogenase (LDH)   . Solitary pulmonary nodule     RLL nodule 1 cm  . Transient ischemic attack (TIA), and cerebral infarction without residual deficits(V12.54)   . Long term (current) use of anticoagulants   . Sacroiliitis 08/27/2012  . Anemia, iron deficiency 10/16/2011  .  Congestive heart failure   . Spondylosis of lumbosacral region 09/24/2012    L/S xray 08/2012: Mild to moderate osteoarthritis and degenerative spondylosis he diffusely. Anterior subluxation of L4 relative to L5 at 5.7 cm. Mild to moderate levoconvex curvature the mid lumbar spine    . Dysrhythmia     hx atrial fib  . Arteritis     hx giant cell arteritis  . Aortic stenosis, severe 03/02/2013    Followed since 2004 with mild aortic valve stenosis and mild regurgitation. Echocardiograms 2006-2013 show  progression from mild to moderate aortic stenosis, mild insufficiency. Mild to moderate mitral valve regurgitation evident in 2013.   07/25/11: Worsening valve disease by echocardiogram, pt is not interested in invasive treatment. Symptoms appear stable for now. Continue to monitor         Past  Surgical History  Procedure Laterality Date  . Vesicovaginal fistula closure w/ tah    . Tonsillectomy    . Breast lumpectomy      bilaterally  . Cataract extraction w/ intraocular lens  implant, bilateral    . Skin cancer excision      "nose and face"  . Abdominal hysterectomy    . Hemorrhoid surgery  11/2008    banding    in for   Chief Complaint  Patient presents with  . Hemoptysis     HPI  [name]  is a 80 y.o. female, with past medical history significant for COPD and recently diagnosed left lung mass presenting with 2 months history of worsening hemoptysis. Patient reports  a steady increase in shortness of breath and a chest x-ray showed worsening of left lower lobe pneumonia/mass/hemorrhage  denies nausea vomiting or diarrhea . Patient is followed usually by Dr. Delfina Redwood and Dr. Hilaria Ota who advised admission for a possible bronchoscopy and monitor during her stay .    Review of Systems    In addition to the HPI above,  No Fever-chills, No Headache, No changes with Vision or hearing, No problems swallowing food or Liquids, No Chest pain,  No Abdominal pain, No Nausea or  Vommitting, Bowel movements are regular, No Blood in stool or Urine, No dysuria, No new skin rashes or bruises, No new joints pains-aches,  No recent weight gain or loss, No polyuria, polydypsia or polyphagia, No significant Mental Stressors.  A full 10 point Review of Systems was done, except as stated above, all other Review of Systems were negative.   Social History History  Substance Use Topics  . Smoking status: Former Smoker -- 1.00 packs/day for 50 years    Types: Cigarettes    Quit date: 08/08/1993  . Smokeless tobacco: Never Used  . Alcohol Use: Yes     Comment: 10/15/11 "have a drink when I go out to dinner; don't go out often"     Family History Family History  Problem Relation Age of Onset  . Hyperlipidemia    . Hypertension    . Stroke    . Colon cancer Daughter   . Heart attack Father   . Kidney failure Father   . Heart disease Son      Prior to Admission medications   Medication Sig Start Date End Date Taking? Authorizing Provider  acetaminophen (TYLENOL) 325 MG tablet Take 650 mg by mouth every 6 (six) hours as needed (For pain.).    Yes Historical Provider, MD  acetaminophen (TYLENOL) 325 MG tablet Take 650 mg by mouth every 6 (six) hours as needed for mild pain.    Yes Historical Provider, MD  acyclovir ointment (ZOVIRAX) 5 % Apply 1 application topically every 3 (three) hours as needed. For cold sores   Yes Historical Provider, MD  albuterol (PROVENTIL HFA;VENTOLIN HFA) 108 (90 BASE) MCG/ACT inhaler Inhale 2 puffs into the lungs every 6 (six) hours as needed for wheezing or shortness of breath.   Yes Historical Provider, MD  aspirin EC 81 MG tablet Take 1 tablet (81 mg total) by mouth daily. 11/19/13  Yes Lelon Perla, MD  augmented betamethasone dipropionate (DIPROLENE-AF) 0.05 % ointment Apply 1 application topically 2 (two) times daily.   Yes Historical Provider, MD  betamethasone dipropionate (DIPROLENE) 0.05 % cream Apply 1 application topically  2 (two) times daily.   Yes Historical Provider, MD  cetirizine (ZYRTEC) 10 MG tablet Take 10 mg by mouth daily.  Yes Historical Provider, MD  chlorhexidine (PERIDEX) 0.12 % solution Use as directed 15 mLs in the mouth or throat. swish 15 mls for 30 sec then spit, twice daily   Yes Historical Provider, MD  Clocortolone Pivalate (CLODERM) 0.1 % cream Apply 1 application topically 2 (two) times daily. Applies to lesion on left cheek.   Yes Historical Provider, MD  coal tar (NEUTROGENA T-GEL) 0.5 % shampoo Apply 1 application topically See admin instructions. She uses on Tuesday and Friday.   Yes Historical Provider, MD  donepezil (ARICEPT) 10 MG tablet Take 10 mg by mouth at bedtime.    Yes Historical Provider, MD  Emollient (CERAVE) CREA Apply 1 application topically daily. Applies to dry skin.   Yes Historical Provider, MD  escitalopram (LEXAPRO) 20 MG tablet Take 20 mg by mouth every morning.    Yes Historical Provider, MD  furosemide (LASIX) 40 MG tablet Take 1 tablet (40 mg total) by mouth every morning. 11/07/13  Yes Eugenie Filler, MD  gabapentin (NEURONTIN) 100 MG capsule Take 200 mg by mouth at bedtime.    Yes Historical Provider, MD  HYDROcodone-acetaminophen (NORCO/VICODIN) 5-325 MG per tablet Take 1-2 tablets by mouth every 6 (six) hours as needed for moderate pain. 11/06/13  Yes Eugenie Filler, MD  hydrOXYzine (ATARAX/VISTARIL) 25 MG tablet Take 25 mg by mouth at bedtime.   Yes Historical Provider, MD  levofloxacin (LEVAQUIN) 500 MG tablet Take 500 mg by mouth daily. Take for 10 days unsure of start date.   Yes Historical Provider, MD  levothyroxine (SYNTHROID, LEVOTHROID) 88 MCG tablet Take 88 mcg by mouth daily before breakfast.    Yes Historical Provider, MD  Multiple Vitamins-Minerals (PRESERVISION AREDS) CAPS Take 1 capsule by mouth 2 (two) times daily with a meal.    Yes Historical Provider, MD  mupirocin ointment (BACTROBAN) 2 % Place 1 application into the nose daily.   Yes  Historical Provider, MD  olopatadine (PATANOL) 0.1 % ophthalmic solution Place 1 drop into both eyes 2 (two) times daily as needed. For dry eyes   Yes Historical Provider, MD  polyethylene glycol (MIRALAX / GLYCOLAX) packet Take 17 g by mouth every morning.    Yes Historical Provider, MD  potassium chloride SA (K-DUR,KLOR-CON) 20 MEQ tablet Take 40 mEq by mouth daily.   Yes Historical Provider, MD  pramoxine-mineral oil-zinc (TUCKS) 1-12.5 % rectal ointment Place 1 application rectally every 2 (two) hours as needed for itching (itching).   Yes Historical Provider, MD  pravastatin (PRAVACHOL) 40 MG tablet Take 1 tablet (40 mg total) by mouth every evening. 03/02/13  Yes Lelon Perla, MD  Propylene Glycol (SYSTANE BALANCE) 0.6 % SOLN Place 1 drop into both eyes daily as needed (For dry or itchy eyes.). One drop both eye as needed   Yes Historical Provider, MD  senna-docusate (SENOKOT S) 8.6-50 MG per tablet Take 1-2 tablets by mouth See admin instructions. She takes one tablet in the morning and two tablets at bedtime.   Yes Historical Provider, MD  tiotropium (SPIRIVA) 18 MCG inhalation capsule Place 18 mcg into inhaler and inhale daily.   Yes Historical Provider, MD  amoxicillin (AMOXIL) 500 MG capsule Take 2,000 mg by mouth once. Prior to dental procedure    Historical Provider, MD  enalapril (VASOTEC) 5 MG tablet Take 5 mg by mouth every morning.     Historical Provider, MD    Allergies  Allergen Reactions  . Cortisone Other (See Comments)    Rash   .  Minocin [Minocycline Hcl] Other (See Comments)    Unknown  . Minocycline Hcl     unknown  . Neomycin-Bacitracin Zn-Polymyx     unknown  . Neosporin [Neomycin-Bacitracin Zn-Polymyx] Other (See Comments)    Unknown  . Other Other (See Comments)    Steroidal Neuromuscular Blockers  . Tetanus Toxoids Other (See Comments)    Unknown    Physical Exam  Vitals  Blood pressure 178/55, pulse 55, temperature 97.7 F (36.5 C), temperature  source Oral, resp. rate 20, weight 65.318 kg (144 lb), SpO2 96 %.   1. General elderly female, well developed and well nourished, very pleasant in no acute distress  2. Normal affect and insight, Not Suicidal or Homicidal, Awake Alert, Oriented X 3.  3. No F.N deficitsgrossly, ALL C.Nerves Intact,.  4. Ears and Eyes appear Normal, Conjunctivae clear,  Moist Oral Mucosa.  5. Supple Neck, No JVD, No cervical lymphadenopathy appriciated, No Carotid Bruits.  6. Symmetrical Chest wall movement, decreased breath sounds right more than left with mild crackles on the right.  7. RRR, systolic ejection murmur noted, No Parasternal Heave.  8. Positive Bowel Sounds, Abdomen Soft, Non tender, No organomegaly appriciated,No rebound -guarding or rigidity.  9.  No Cyanosis, Normal Skin Turgor, No Skin Rash or Bruise.  10. Good muscle tone,  joints appear normal , no effusions, Normal ROM.    Data Review  CBC  Recent Labs Lab 01/25/14 1502  WBC 7.7  HGB 11.6*  HCT 37.0  PLT 258  MCV 89.4  MCH 28.0  MCHC 31.4  RDW 23.4*  LYMPHSABS 1.4  MONOABS 0.6  EOSABS 0.5  BASOSABS 0.0   ------------------------------------------------------------------------------------------------------------------  Chemistries   Recent Labs Lab 01/25/14 1502  NA 141  K 4.5  CL 105  CO2 21  GLUCOSE 108*  BUN 27*  CREATININE 2.09*  CALCIUM 9.2   ------------------------------------------------------------------------------------------------------------------ estimated creatinine clearance is 14.8 mL/min (by C-G formula based on Cr of 2.09). ------------------------------------------------------------------------------------------------------------------ No results for input(s): TSH, T4TOTAL, T3FREE, THYROIDAB in the last 72 hours.  Invalid input(s): FREET3   Coagulation profile No results for input(s): INR, PROTIME in the last 168  hours. ------------------------------------------------------------------------------------------------------------------- No results for input(s): DDIMER in the last 72 hours. -------------------------------------------------------------------------------------------------------------------  Cardiac Enzymes No results for input(s): CKMB, TROPONINI, MYOGLOBIN in the last 168 hours.  Invalid input(s): CK ------------------------------------------------------------------------------------------------------------------ Invalid input(s): POCBNP   Imaging results:   Dg Chest 2 View (if Patient Has Fever And/or Copd)  01/25/2014   CLINICAL DATA:  80 year old female with newly diagnosed lung mass and possible pneumonia, with hemoptysis.  EXAM: CHEST  2 VIEW  COMPARISON:  Chest x-ray 01/15/2014.  Chest CT 01/22/2014.  FINDINGS: Again noted is a mass like opacity in the posterior aspect of the right lower lobe, better demonstrated on recent chest CT. There are ill-defined opacities throughout the right lower lobe, likely to reflect some airspace consolidation, particularly from hemorrhage given the patient's history of hemoptysis. Alternatively, some of this could reflect some postobstructive pneumonitis or pneumonia. Left lung appears clear. No definite pleural effusions. Linear opacities in the upper lobes of the lungs bilaterally are similar to prior examinations, compatible with areas of chronic scarring. Heart size is mildly enlarged. Moderate to large hiatal hernia again noted. Atherosclerosis in the thoracic aorta.  IMPRESSION: 1. Overall, the chest x-ray appears very similar to prior study 01/15/2014, with right lower lobe mass and airspace disease in the right lower lobe, which is favored to reflect some hemorrhage given the patient's history of  hemoptysis, but may alternatively reflect some postobstructive changes.   Electronically Signed   By: Vinnie Langton M.D.   On: 01/25/2014 17:46   Dg  Chest 2 View  01/15/2014   CLINICAL DATA:  Hemoptysis with COPD in shortness of breath, subsequent encounter.  EXAM: CHEST  2 VIEW  COMPARISON:  01/29/2013.  FINDINGS: Lungs are hyperexpanded with scattered areas of upper and lower lung scarring. Patchy opacity overlying the lower spine on the lateral film is probably related airspace disease although this is not easily localized on the frontal projection which appears very similar to the earlier study. The cardio pericardial silhouette is enlarged. Hiatal hernia again noted. Bones are diffusely demineralized.  IMPRESSION: Emphysema with patchy opacity over the posterior lung bases on the lateral film. This is probably airspace disease and pneumonia would be a consideration. Less likely, there could be a sclerotic lesion in the T10 vertebral body. Repeat two view chest x-ray after treatment could be used to ensure resolution. Alternatively a CT scan of the chest could be used to further evaluate as clinically warranted.   Electronically Signed   By: Misty Stanley M.D.   On: 01/15/2014 15:46   Ct Chest Wo Contrast  01/22/2014   CLINICAL DATA:  Aspiration pneumonia. Hemoptysis, shortness of breath, and 20 lb weight loss.  BUN and creatinine were obtained on site at [place] at  [address]  Results:  BUN 25 mg/dL,  Creatinine 1.84 mg/dL.  EXAM: CT CHEST WITHOUT CONTRAST  TECHNIQUE: Multidetector CT imaging of the chest was performed following the standard protocol without IV contrast.  COMPARISON:  Chest radiographs 01/15/2014 and CT 10/08/2011  FINDINGS: No enlarged axillary, mediastinal, or hilar lymph nodes are identified. Extensive, diffuse atherosclerotic calcification is noted of the visualized aorta and coronary arteries. There is trace pericardial fluid. Heart is again noted to be mildly enlarged. Aortic valve and mitral annular calcifications are also noted. There is a moderately large hiatal hernia.  There is no pleural effusion.  Centrilobular emphysema is again seen. Peripheral curvilinear opacities in the peripheral aspects of the upper lobes are similar to the prior study and likely represent scarring. 12 mm sub solid nodule in the right middle lobe has minimally increased in size (series 3, image 40, previously 10 mm).  There is new ground-glass opacity throughout the medial left lower lobe. There is progressive architectural distortion in the posterior right lower lobe with cystic changes again seen. Consolidative masslike opacity adjacent to this area of bullous change has increased from the prior CT and measures approximately 3.3 x 2.2 cm (series 3, image 34). Additional patchy areas of consolidative opacity are present more inferiorly in the right lower lobe. There is progressive interlobular septal thickening in the basilar right lower lobe. 5 mm ground-glass left upper lobe nodule is unchanged (series 3, image 15). 2 mm left upper lobe subpleural nodule is unchanged (series 3, image 23).  Small calcifications in the right renal hilum may reflect a combination of vascular calcification and nonobstructing collecting system stones. Thoracolumbar spondylosis is noted.  IMPRESSION: 1. Progressive changes in the right lower lobe as above with a new masslike opacity measuring 3.3 x 2.2 cm. Given history, this is concerning for primary bronchogenic carcinoma, with additional ground-glass and consolidative opacities elsewhere in the right lower lobe possibly representing hemorrhage. Pneumonia superimposed on chronic aspiration might be an additional consideration, however further evaluation with PET-CT is recommended given concern for malignancy. 2. Slightly increased size of 12 mm  nodule in the right middle lobe, which may reflect slow growing primary lung adenocarcinoma.  These results will be called to the ordering clinician or representative by the Radiologist Assistant, and communication documented in the PACS or zVision Dashboard.    Electronically Signed   By: Logan Bores   On: 01/22/2014 16:17      Assessment & Plan  1. Hemoptysis with right lung mass and bleeding by CT done on 01/22/2014 ,possible obstructive pneumonia     Admit to regular floor     Bronchoscopy     Check CBC in a.m.     Vancomycin and cefepime  2. COPD     Nebulizer treatments     Oxygen  3.acute on chronic renal failure    Monitor since patient is on vancomycin    IV fluids  4.history of aortic stenosis  DVT Prophylaxis SCDs  AM Labs Ordered, also please review Full Orders  Family Communication: Admission, patients condition and plan of care including tests being ordered have been discussed with the patient and daughter who indicate understanding and agree with the plan and Code Status.  Code Status full  Disposition Plan: back to [facility]   Time spent in minutes : 34 minutes  Condition GUARDED   @SIGNATURE @

## 2014-01-25 NOTE — ED Notes (Signed)
Pt with newly diagnosed right lung mass here with possible PNA and hemoptysis that is better today but still present

## 2014-01-25 NOTE — ED Provider Notes (Signed)
CSN: [id number]     Arrival date & time 01/25/14  1422 History   First MD Initiated Contact with Patient 01/25/14 1930     Chief Complaint  Patient presents with  . Hemoptysis     (Consider location/radiation/quality/duration/timing/severity/associated sxs/prior Treatment) HPI 80 year old female with COPD with known right lower lobe lung mass being treated with Levaquin for possible aspiration versus postobstructive pneumonia for the last several days has had 2 months of cough with small amounts of hemoptysis which is gradually improving but not resolving with little if any hemoptysis over the last few days but recent CT scan showed right lower lobe lung mass with no biopsy performed yet although patient states she does not want any treatment even if she does have lung cancer and she has been using albuterol as needed for her chronic shortness of breath which is gradually worsening over the last several days but she does not want a breathing treatment now in the ED; she has no fever no confusion no shortness of breath at rest now although at baseline and shortness of breath walking less than 10 feet she is no abdominal pain no large amounts of hemoptysis and no other concerns. She states her primary care doctor as well as the nurse at her lung doctor's office recommended she be admitted tonight for her chronic hemoptysis, known lung mass, and possible pneumonia. Past Medical History  Diagnosis Date  . Cholelithiases   . Hypothyroid   . Leg paresthesia   . Aortic valve stenosis   . Arthritis   . Hyperlipidemia   . Dementia   . Carotid artery occlusion   . Renal disorder   . Hypertension   . COPD (chronic obstructive pulmonary disease)   . SEIZURE DISORDER 11/05/2006  . GERD 11/05/2006  . CVA 02/07/2007  . Macular degeneration of both eyes   . Blind in both eyes   . Peripheral neuropathy   . Pneumonia     "many times"  . Internal hemorrhoid, bleeding   . Malaria     "as a child"  .  Skin cancer     "nose, face mostly"  . Chronic lower back pain     "when I go to the bathroom"  . Intestinal infection due to Clostridium difficile 09/2011  . Benign neoplasm of breast   . Gout, unspecified   . Anemia   . Anxiety   . Other dysfunctions of sleep stages or arousal from sleep   . Depression   . Chronic rhinitis   . Abscess of lung(513.0)     RUL  . Diaphragmatic hernia without mention of obstruction or gangrene   . Diverticulosis of colon (without mention of hemorrhage)   . Acute duodenal ulcer with perforation and obstruction 08/2011    perforation  . Hemorrhage of rectum and anus   . Abscess of intestine 09/2011    drainage  . Other psoriasis   . Lumbago   . Nonspecific elevation of levels of transaminase or lactic acid dehydrogenase (LDH)   . Solitary pulmonary nodule     RLL nodule 1 cm  . Transient ischemic attack (TIA), and cerebral infarction without residual deficits(V12.54)   . Long term (current) use of anticoagulants   . Sacroiliitis 08/27/2012  . Anemia, iron deficiency 10/16/2011  . Congestive heart failure   . Spondylosis of lumbosacral region 09/24/2012    L/S xray 08/2012: Mild to moderate osteoarthritis and degenerative spondylosis he diffusely. Anterior subluxation of L4 relative to L5 at 5.7  cm. Mild to moderate levoconvex curvature the mid lumbar spine    . Dysrhythmia     hx atrial fib  . Arteritis     hx giant cell arteritis  . Aortic stenosis, severe 03/02/2013    Followed since 2004 with mild aortic valve stenosis and mild regurgitation. Echocardiograms 2006-2013 show  progression from mild to moderate aortic stenosis, mild insufficiency. Mild to moderate mitral valve regurgitation evident in 2013.   07/25/11: Worsening valve disease by echocardiogram, pt is not interested in invasive treatment. Symptoms appear stable for now. Continue to monitor      Past Surgical History  Procedure Laterality Date  . Vesicovaginal fistula closure w/ tah    .  Tonsillectomy    . Breast lumpectomy      bilaterally  . Cataract extraction w/ intraocular lens  implant, bilateral    . Skin cancer excision      "nose and face"  . Abdominal hysterectomy    . Hemorrhoid surgery  11/2008    banding   Family History  Problem Relation Age of Onset  . Hyperlipidemia    . Hypertension    . Stroke    . Colon cancer Daughter   . Heart attack Father   . Kidney failure Father   . Heart disease Son    History  Substance Use Topics  . Smoking status: Former Smoker -- 1.00 packs/day for 50 years    Types: Cigarettes    Quit date: 08/08/1993  . Smokeless tobacco: Never Used  . Alcohol Use: Yes     Comment: 10/15/11 "have a drink when I go out to dinner; don't go out often"   OB History    No data available     Review of Systems  10 Systems reviewed and are negative for acute change except as noted in the HPI.  Allergies  Cortisone; Minocin; Minocycline hcl; Neomycin-bacitracin zn-polymyx; Neosporin; Other; and Tetanus toxoids  Home Medications   Prior to Admission medications   Medication Sig Start Date End Date Taking? Authorizing Provider  acetaminophen (TYLENOL) 325 MG tablet Take 650 mg by mouth every 6 (six) hours as needed (For pain.).    Yes Historical Provider, MD  acetaminophen (TYLENOL) 325 MG tablet Take 650 mg by mouth every 6 (six) hours as needed for mild pain.    Yes Historical Provider, MD  acyclovir ointment (ZOVIRAX) 5 % Apply 1 application topically every 3 (three) hours as needed. For cold sores   Yes Historical Provider, MD  albuterol (PROVENTIL HFA;VENTOLIN HFA) 108 (90 BASE) MCG/ACT inhaler Inhale 2 puffs into the lungs every 6 (six) hours as needed for wheezing or shortness of breath.   Yes Historical Provider, MD  aspirin EC 81 MG tablet Take 1 tablet (81 mg total) by mouth daily. 11/19/13  Yes Lelon Perla, MD  augmented betamethasone dipropionate (DIPROLENE-AF) 0.05 % ointment Apply 1 application topically 2 (two)  times daily.   Yes Historical Provider, MD  betamethasone dipropionate (DIPROLENE) 0.05 % cream Apply 1 application topically 2 (two) times daily.   Yes Historical Provider, MD  cetirizine (ZYRTEC) 10 MG tablet Take 10 mg by mouth daily.   Yes Historical Provider, MD  chlorhexidine (PERIDEX) 0.12 % solution Use as directed 15 mLs in the mouth or throat. swish 15 mls for 30 sec then spit, twice daily   Yes Historical Provider, MD  Clocortolone Pivalate (CLODERM) 0.1 % cream Apply 1 application topically 2 (two) times daily. Applies to lesion on left  cheek.   Yes Historical Provider, MD  coal tar (NEUTROGENA T-GEL) 0.5 % shampoo Apply 1 application topically See admin instructions. She uses on Tuesday and Friday.   Yes Historical Provider, MD  donepezil (ARICEPT) 10 MG tablet Take 10 mg by mouth at bedtime.    Yes Historical Provider, MD  Emollient (CERAVE) CREA Apply 1 application topically daily. Applies to dry skin.   Yes Historical Provider, MD  escitalopram (LEXAPRO) 20 MG tablet Take 20 mg by mouth every morning.    Yes Historical Provider, MD  furosemide (LASIX) 40 MG tablet Take 1 tablet (40 mg total) by mouth every morning. 11/07/13  Yes Eugenie Filler, MD  gabapentin (NEURONTIN) 100 MG capsule Take 200 mg by mouth at bedtime.    Yes Historical Provider, MD  HYDROcodone-acetaminophen (NORCO/VICODIN) 5-325 MG per tablet Take 1-2 tablets by mouth every 6 (six) hours as needed for moderate pain. 11/06/13  Yes Eugenie Filler, MD  hydrOXYzine (ATARAX/VISTARIL) 25 MG tablet Take 25 mg by mouth at bedtime.   Yes Historical Provider, MD  levofloxacin (LEVAQUIN) 500 MG tablet Take 500 mg by mouth daily. Take for 10 days unsure of start date.   Yes Historical Provider, MD  levothyroxine (SYNTHROID, LEVOTHROID) 88 MCG tablet Take 88 mcg by mouth daily before breakfast.    Yes Historical Provider, MD  Multiple Vitamins-Minerals (PRESERVISION AREDS) CAPS Take 1 capsule by mouth 2 (two) times daily with  a meal.    Yes Historical Provider, MD  mupirocin ointment (BACTROBAN) 2 % Place 1 application into the nose daily.   Yes Historical Provider, MD  olopatadine (PATANOL) 0.1 % ophthalmic solution Place 1 drop into both eyes 2 (two) times daily as needed. For dry eyes   Yes Historical Provider, MD  polyethylene glycol (MIRALAX / GLYCOLAX) packet Take 17 g by mouth every morning.    Yes Historical Provider, MD  potassium chloride SA (K-DUR,KLOR-CON) 20 MEQ tablet Take 40 mEq by mouth daily.   Yes Historical Provider, MD  pramoxine-mineral oil-zinc (TUCKS) 1-12.5 % rectal ointment Place 1 application rectally every 2 (two) hours as needed for itching (itching).   Yes Historical Provider, MD  pravastatin (PRAVACHOL) 40 MG tablet Take 1 tablet (40 mg total) by mouth every evening. 03/02/13  Yes Lelon Perla, MD  Propylene Glycol (SYSTANE BALANCE) 0.6 % SOLN Place 1 drop into both eyes daily as needed (For dry or itchy eyes.). One drop both eye as needed   Yes Historical Provider, MD  senna-docusate (SENOKOT S) 8.6-50 MG per tablet Take 1-2 tablets by mouth See admin instructions. She takes one tablet in the morning and two tablets at bedtime.   Yes Historical Provider, MD  tiotropium (SPIRIVA) 18 MCG inhalation capsule Place 18 mcg into inhaler and inhale daily.   Yes Historical Provider, MD  amoxicillin (AMOXIL) 500 MG capsule Take 2,000 mg by mouth once. Prior to dental procedure    Historical Provider, MD  enalapril (VASOTEC) 5 MG tablet Take 5 mg by mouth every morning.     Historical Provider, MD   BP 193/76 mmHg  Pulse 56  Temp(Src) 97.9 F (36.6 C) (Oral)  Resp 20  Ht 5\' 4"  (1.626 m)  Wt 147 lb (66.679 kg)  BMI 25.22 kg/m2  SpO2 99% Physical Exam  Constitutional:  Awake, alert, nontoxic appearance.  HENT:  Head: Atraumatic.  Eyes: Right eye exhibits no discharge. Left eye exhibits no discharge.  Neck: Neck supple.  Cardiovascular: Normal rate and regular rhythm.  No murmur  heard. Pulmonary/Chest: Effort normal. No respiratory distress. She has wheezes. She has rales. She exhibits no tenderness.  Room air pulse oximetry normal 97% few crackles right base few scattered end expiratory wheezes no retractions no accessory muscle usage speech normal at rest with full sentences patient does not want breathing treatment in the ED  Abdominal: Soft. There is no tenderness. There is no rebound.  Musculoskeletal: She exhibits no edema or tenderness.  Baseline ROM, no obvious new focal weakness.  Neurological: She is alert.  Mental status and motor strength appears baseline for patient and situation.  Skin: No rash noted.  Psychiatric: She has a normal mood and affect.  Nursing note and vitals reviewed.   ED Course  Procedures (including critical care time) D/w [facility] for admit. [place] Reviewed  CBC WITH DIFFERENTIAL - Abnormal; Notable for the following:    Hemoglobin 11.6 (*)    RDW 23.4 (*)    Eosinophils Relative 6 (*)    All other components within normal limits  BASIC METABOLIC PANEL - Abnormal; Notable for the following:    Glucose, Bld 108 (*)    BUN 27 (*)    Creatinine, Ser 2.09 (*)    GFR calc non Af Amer 19 (*)    GFR calc Af Amer 23 (*)    All other components within normal limits  CBC - Abnormal; Notable for the following:    RBC 3.67 (*)    Hemoglobin 10.3 (*)    HCT 32.8 (*)    RDW 23.8 (*)    All other components within normal limits  BASIC METABOLIC PANEL - Abnormal; Notable for the following:    Glucose, Bld 121 (*)    BUN 31 (*)    Creatinine, Ser 1.87 (*)    GFR calc non Af Amer 22 (*)    GFR calc Af Amer 26 (*)    All other components within normal limits  CULTURE, EXPECTORATED SPUTUM-ASSESSMENT  CULTURE, BLOOD (ROUTINE X 2)  CULTURE, BLOOD (ROUTINE X 2)  LEGIONELLA ANTIGEN, URINE  STREP PNEUMONIAE URINARY ANTIGEN  I-STAT CG4 LACTIC ACID, ED    Imaging Review Dg Chest 2 View (if Patient Has Fever And/or  Copd)  01/25/2014   CLINICAL DATA:  80 year old female with newly diagnosed lung mass and possible pneumonia, with hemoptysis.  EXAM: CHEST  2 VIEW  COMPARISON:  Chest x-ray 01/15/2014.  Chest CT 01/22/2014.  FINDINGS: Again noted is a mass like opacity in the posterior aspect of the right lower lobe, better demonstrated on recent chest CT. There are ill-defined opacities throughout the right lower lobe, likely to reflect some airspace consolidation, particularly from hemorrhage given the patient's history of hemoptysis. Alternatively, some of this could reflect some postobstructive pneumonitis or pneumonia. Left lung appears clear. No definite pleural effusions. Linear opacities in the upper lobes of the lungs bilaterally are similar to prior examinations, compatible with areas of chronic scarring. Heart size is mildly enlarged. Moderate to large hiatal hernia again noted. Atherosclerosis in the thoracic aorta.  IMPRESSION: 1. Overall, the chest x-ray appears very similar to prior study 01/15/2014, with right lower lobe mass and airspace disease in the right lower lobe, which is favored to reflect some hemorrhage given the patient's history of hemoptysis, but may alternatively reflect some postobstructive changes.   Electronically Signed   By: Vinnie Langton M.D.   On: 01/25/2014 17:46     EKG Interpretation None      MDM  Final diagnoses:  Postobstructive pneumonia  Aspiration pneumonia, unspecified aspiration pneumonia type  Lung mass  Chronic obstructive pulmonary disease, unspecified COPD, unspecified chronic bronchitis type  Renal insufficiency    Patient / Family / Caregiver informed of clinical course, understand medical decision-making process, and agree with plan. The patient appears reasonably stabilized for admission considering the current resources, flow, and capabilities available in the ED at this time, and I doubt any other [facility] requiring further screening and/or treatment in  the ED prior to admission.    Babette Relic, MD 01/26/14 [phone]

## 2014-01-26 ENCOUNTER — Telehealth: Payer: Self-pay | Admitting: Pulmonary Disease

## 2014-01-26 ENCOUNTER — Encounter (HOSPITAL_COMMUNITY): Payer: Self-pay

## 2014-01-26 DIAGNOSIS — I10 Essential (primary) hypertension: Secondary | ICD-10-CM

## 2014-01-26 DIAGNOSIS — J984 Other disorders of lung: Secondary | ICD-10-CM

## 2014-01-26 DIAGNOSIS — R042 Hemoptysis: Secondary | ICD-10-CM

## 2014-01-26 DIAGNOSIS — J189 Pneumonia, unspecified organism: Secondary | ICD-10-CM

## 2014-01-26 LAB — BASIC METABOLIC PANEL
Anion gap: 14 (ref 5–15)
BUN: 31 mg/dL — AB (ref 6–23)
CALCIUM: 9.2 mg/dL (ref 8.4–10.5)
CO2: 19 mEq/L (ref 19–32)
Chloride: 110 mEq/L (ref 96–112)
Creatinine, Ser: 1.87 mg/dL — ABNORMAL HIGH (ref 0.50–1.10)
GFR, EST AFRICAN AMERICAN: 26 mL/min — AB (ref >90)
GFR, EST NON AFRICAN AMERICAN: 22 mL/min — AB (ref >90)
GLUCOSE: 121 mg/dL — AB (ref 70–99)
POTASSIUM: 4.6 meq/L (ref 3.7–5.3)
SODIUM: 143 meq/L (ref 137–147)

## 2014-01-26 LAB — CBC
HCT: 32.8 % — ABNORMAL LOW (ref 36.0–46.0)
Hemoglobin: 10.3 g/dL — ABNORMAL LOW (ref 12.0–15.0)
MCH: 28.1 pg (ref 26.0–34.0)
MCHC: 31.4 g/dL (ref 30.0–36.0)
MCV: 89.4 fL (ref 78.0–100.0)
Platelets: 212 10*3/uL (ref 150–400)
RBC: 3.67 MIL/uL — ABNORMAL LOW (ref 3.87–5.11)
RDW: 23.8 % — AB (ref 11.5–15.5)
WBC: 7.6 10*3/uL (ref 4.0–10.5)

## 2014-01-26 LAB — EXPECTORATED SPUTUM ASSESSMENT W GRAM STAIN, RFLX TO RESP C

## 2014-01-26 LAB — LEGIONELLA ANTIGEN, URINE

## 2014-01-26 LAB — STREP PNEUMONIAE URINARY ANTIGEN: STREP PNEUMO URINARY ANTIGEN: NEGATIVE

## 2014-01-26 LAB — EXPECTORATED SPUTUM ASSESSMENT W REFEX TO RESP CULTURE

## 2014-01-26 MED ORDER — ALBUTEROL SULFATE (2.5 MG/3ML) 0.083% IN NEBU
2.5000 mg | INHALATION_SOLUTION | Freq: Three times a day (TID) | RESPIRATORY_TRACT | Status: DC
  Administered 2014-01-26 (×3): 2.5 mg via RESPIRATORY_TRACT
  Filled 2014-01-26 (×3): qty 3

## 2014-01-26 MED ORDER — HYDRALAZINE HCL 20 MG/ML IJ SOLN: 10.0000 mg | Freq: Four times a day (QID) | INTRAMUSCULAR | Status: DC | PRN

## 2014-01-26 MED ORDER — AMLODIPINE BESYLATE 5 MG PO TABS
5.0000 mg | ORAL_TABLET | Freq: Every day | ORAL | Status: DC
  Administered 2014-01-26 – 2014-01-27 (×2): 5 mg via ORAL
  Filled 2014-01-26 (×2): qty 1

## 2014-01-26 NOTE — Progress Notes (Signed)
Pt BP 193/76.  MD notified.  PO norvasc given at 1343.  MD said to wait 1 hr. For med to kick in.  Will continue to monitor. [name]

## 2014-01-26 NOTE — Progress Notes (Signed)
MD confirmed, pt is a DNR. [name]

## 2014-01-26 NOTE — Telephone Encounter (Signed)
Called daughter and made her aware will forward to VS as an [place]

## 2014-01-26 NOTE — Progress Notes (Signed)
PATIENT DETAILS Name: [name] Age: 80 y.o. Sex: female Date of Birth: [birth year] Admit Date: 01/25/2014 Admitting Physician Merton Border, MD HCW:CBJSEG,BTDVVO D, MD  Subjective: Better, less hemoptysis  Assessment/Plan: Active Problems:  Hemoptysis:secondary to postobstructive pneumonia. Treat with IV Abx, slowly transition to oral Abx. Improving.  Post Obstructive HYW:VPXTGGY above. Improving, afebrile. IV Abx as below.  Known RLL Lung Mass:likely malignancy (ex smoker). After d/w patient, she does not want treatment in form of chemo/RTx or surgery. No point in pursuing Bronchoscopy or Bx. If this gets worse/recurernt PNA-needs Palliative eval at some point.   Hypothyroidism:on Levothyroxine, continue.  COPD:stable, continue Nebs and spiriva  Chronic Resp Failure:continue O2   IRS:WNIOE cough-hold of on re-starting ACEI.Will start low dose Amlodipine instead.  Severe Aortic Stenosis:not a surgical candidate. Outpatient monitoring.  Hx of VOJ:[id number] on coumadin, discontinued by Dr Stanford Breed on Aug 2015. On ASA-but on hold due to hemoptysis.   Disposition: Remain inpatient  Antibiotics:  IV Vancomycin 11/2>>  IV Cefepime 11/2>>  DVT Prophylaxis: SCD's  Code Status: DNR  Family Communication None at bedside  Procedures:  None  CONSULTS:  pulmonary/intensive care  Time spent 40 minutes-which includes 50% of the time with face-to-face with patient/ family and coordinating care related to the above assessment and plan.  MEDICATIONS: Scheduled Meds: . albuterol  2.5 mg Nebulization TID  . ceFEPime (MAXIPIME) IV  1 g Intravenous Q24H  . donepezil  10 mg Oral QHS  . escitalopram  20 mg Oral q morning - 10a  . gabapentin  200 mg Oral QHS  . levothyroxine  88 mcg Oral QAC breakfast  . loratadine  10 mg Oral Daily  . mupirocin ointment  1 application Nasal Daily  . olopatadine  1 drop Both Eyes BID  . polyethylene glycol  17 g Oral q morning -  10a  . potassium chloride SA  40 mEq Oral Daily  . pravastatin  40 mg Oral QPM  . senna-docusate  1 tablet Oral Daily  . senna-docusate  2 tablet Oral QHS  . sodium chloride  3 mL Intravenous Q12H  . tiotropium  18 mcg Inhalation Daily  . [START ON 01/28/2014] vancomycin  1,000 mg Intravenous Q48H   Continuous Infusions: . sodium chloride 50 mL/hr at 01/25/14 2115   PRN Meds:.sodium chloride, acetaminophen **OR** acetaminophen, albuterol, HYDROcodone-acetaminophen, morphine injection, ondansetron **OR** ondansetron (ZOFRAN) IV, polyvinyl alcohol, pramoxine-mineral oil-zinc, sodium chloride, zolpidem  Antibiotics: Anti-infectives    Start     Dose/Rate Route Frequency Ordered Stop   01/28/14 0600  vancomycin (VANCOCIN) IVPB 1000 mg/200 mL premix     1,000 mg200 mL/hr over 60 Minutes Intravenous Every 48 hours 01/25/14 2047 02/02/14 2359   01/26/14 2000  ceFEPIme (MAXIPIME) 1 g in dextrose 5 % 50 mL IVPB  Status:  Discontinued     1 g100 mL/hr over 30 Minutes Intravenous Every 24 hours 01/25/14 2046 01/25/14 2136   01/25/14 2230  ceFEPIme (MAXIPIME) 1 g in dextrose 5 % 50 mL IVPB     1 g100 mL/hr over 30 Minutes Intravenous Every 24 hours 01/25/14 2136     01/25/14 2200  ceFEPIme (MAXIPIME) 1 g in dextrose 5 % 50 mL IVPB  Status:  Discontinued     1 g100 mL/hr over 30 Minutes Intravenous 3 times per day 01/25/14 2106 01/25/14 2131   01/25/14 2000  vancomycin (VANCOCIN) IVPB 1000 mg/200 mL premix     1,000 mg200 mL/hr over 60 Minutes  Intravenous  Once 01/25/14 1953 01/26/14 0529   01/25/14 2000  piperacillin-tazobactam (ZOSYN) IVPB 3.375 g     3.375 g100 mL/hr over 30 Minutes Intravenous  Once 01/25/14 1953 01/25/14 2109       PHYSICAL EXAM: Vital signs in last 24 hours: Filed Vitals:   01/25/14 1945 01/25/14 2030 01/25/14 2107 01/26/14 0434  BP: 178/55 174/58 169/54 140/60  Pulse: 55 55 55 52  Temp:   97.8 F (36.6 C) 97.9 F (36.6 C)  TempSrc:   Oral Oral  Resp: 20 20 18 20     Height:   5\' 4"  (1.626 m)   Weight:   66.679 kg (147 lb)   SpO2: 96% 96% 96% 96%    Weight change:  Filed Weights   01/25/14 1930 01/25/14 2107  Weight: 65.318 kg (144 lb) 66.679 kg (147 lb)   Body mass index is 25.22 kg/(m^2).   Gen Exam: Awake and alert with clear speech.  Neck: Supple, No JVD.   Chest: B/L Clear.   CVS: S1 S2 Regular, +3/6 syst murmur Abdomen: soft, BS +, non tender, non distended.  Extremities: no edema, lower extremities warm to touch. Neurologic: Non Focal.   Skin: No Rash.   Wounds: N/A.   Intake/Output from previous day:  Intake/Output Summary (Last 24 hours) at 01/26/14 1259 Last data filed at 01/26/14 0505  Gross per 24 hour  Intake 820.83 ml  Output      0 ml  Net 820.83 ml     LAB RESULTS: CBC  Recent Labs Lab 01/25/14 1502 01/26/14 0531  WBC 7.7 7.6  HGB 11.6* 10.3*  HCT 37.0 32.8*  PLT 258 212  MCV 89.4 89.4  MCH 28.0 28.1  MCHC 31.4 31.4  RDW 23.4* 23.8*  LYMPHSABS 1.4  --   MONOABS 0.6  --   EOSABS 0.5  --   BASOSABS 0.0  --     Chemistries   Recent Labs Lab 01/25/14 1502 01/26/14 0531  NA 141 143  K 4.5 4.6  CL 105 110  CO2 21 19  GLUCOSE 108* 121*  BUN 27* 31*  CREATININE 2.09* 1.87*  CALCIUM 9.2 9.2    CBG: No results for input(s): GLUCAP in the last 168 hours.  GFR Estimated Creatinine Clearance: 18 mL/min (by C-G formula based on Cr of 1.87).  Coagulation profile No results for input(s): INR, PROTIME in the last 168 hours.  Cardiac Enzymes No results for input(s): CKMB, TROPONINI, MYOGLOBIN in the last 168 hours.  Invalid input(s): CK  Invalid input(s): POCBNP No results for input(s): DDIMER in the last 72 hours. No results for input(s): HGBA1C in the last 72 hours. No results for input(s): CHOL, HDL, LDLCALC, TRIG, CHOLHDL, LDLDIRECT in the last 72 hours. No results for input(s): TSH, T4TOTAL, T3FREE, THYROIDAB in the last 72 hours.  Invalid input(s): FREET3 No results for input(s):  VITAMINB12, FOLATE, FERRITIN, TIBC, IRON, RETICCTPCT in the last 72 hours. No results for input(s): LIPASE, AMYLASE in the last 72 hours.  Urine Studies No results for input(s): UHGB, CRYS in the last 72 hours.  Invalid input(s): UACOL, UAPR, USPG, UPH, UTP, UGL, UKET, UBIL, UNIT, UROB, ULEU, UEPI, UWBC, URBC, UBAC, CAST, UCOM, BILUA  MICROBIOLOGY: Recent Results (from the past 240 hour(s))  Culture, sputum-assessment     Status: None   Collection Time: 01/26/14  2:43 AM  Result Value Ref Range Status   Specimen Description SPUTUM  Final   Special Requests NONE  Final   Sputum  evaluation   Final    MICROSCOPIC FINDINGS SUGGEST THAT THIS SPECIMEN IS NOT REPRESENTATIVE OF LOWER RESPIRATORY SECRETIONS. PLEASE RECOLLECT. RESULT CALLED TO, READ BACK BY AND VERIFIED WITH: NOTIFIED Deeann Dowse RN [phone] GREEN R    Report Status 01/26/2014 FINAL  Final    RADIOLOGY STUDIES/RESULTS: Dg Chest 2 View (if Patient Has Fever And/or Copd)  01/25/2014   CLINICAL DATA:  80 year old female with newly diagnosed lung mass and possible pneumonia, with hemoptysis.  EXAM: CHEST  2 VIEW  COMPARISON:  Chest x-ray 01/15/2014.  Chest CT 01/22/2014.  FINDINGS: Again noted is a mass like opacity in the posterior aspect of the right lower lobe, better demonstrated on recent chest CT. There are ill-defined opacities throughout the right lower lobe, likely to reflect some airspace consolidation, particularly from hemorrhage given the patient's history of hemoptysis. Alternatively, some of this could reflect some postobstructive pneumonitis or pneumonia. Left lung appears clear. No definite pleural effusions. Linear opacities in the upper lobes of the lungs bilaterally are similar to prior examinations, compatible with areas of chronic scarring. Heart size is mildly enlarged. Moderate to large hiatal hernia again noted. Atherosclerosis in the thoracic aorta.  IMPRESSION: 1. Overall, the chest x-ray appears very  similar to prior study 01/15/2014, with right lower lobe mass and airspace disease in the right lower lobe, which is favored to reflect some hemorrhage given the patient's history of hemoptysis, but may alternatively reflect some postobstructive changes.   Electronically Signed   By: Vinnie Langton M.D.   On: 01/25/2014 17:46   Dg Chest 2 View  01/15/2014   CLINICAL DATA:  Hemoptysis with COPD in shortness of breath, subsequent encounter.  EXAM: CHEST  2 VIEW  COMPARISON:  01/29/2013.  FINDINGS: Lungs are hyperexpanded with scattered areas of upper and lower lung scarring. Patchy opacity overlying the lower spine on the lateral film is probably related airspace disease although this is not easily localized on the frontal projection which appears very similar to the earlier study. The cardio pericardial silhouette is enlarged. Hiatal hernia again noted. Bones are diffusely demineralized.  IMPRESSION: Emphysema with patchy opacity over the posterior lung bases on the lateral film. This is probably airspace disease and pneumonia would be a consideration. Less likely, there could be a sclerotic lesion in the T10 vertebral body. Repeat two view chest x-ray after treatment could be used to ensure resolution. Alternatively a CT scan of the chest could be used to further evaluate as clinically warranted.   Electronically Signed   By: Misty Stanley M.D.   On: 01/15/2014 15:46   Ct Chest Wo Contrast  01/22/2014   CLINICAL DATA:  Aspiration pneumonia. Hemoptysis, shortness of breath, and 20 lb weight loss.  BUN and creatinine were obtained on site at [place] at  [address]  Results:  BUN 25 mg/dL,  Creatinine 1.84 mg/dL.  EXAM: CT CHEST WITHOUT CONTRAST  TECHNIQUE: Multidetector CT imaging of the chest was performed following the standard protocol without IV contrast.  COMPARISON:  Chest radiographs 01/15/2014 and CT 10/08/2011  FINDINGS: No enlarged axillary, mediastinal, or hilar lymph nodes  are identified. Extensive, diffuse atherosclerotic calcification is noted of the visualized aorta and coronary arteries. There is trace pericardial fluid. Heart is again noted to be mildly enlarged. Aortic valve and mitral annular calcifications are also noted. There is a moderately large hiatal hernia.  There is no pleural effusion. Centrilobular emphysema is again seen. Peripheral curvilinear opacities in the peripheral aspects  of the upper lobes are similar to the prior study and likely represent scarring. 12 mm sub solid nodule in the right middle lobe has minimally increased in size (series 3, image 40, previously 10 mm).  There is new ground-glass opacity throughout the medial left lower lobe. There is progressive architectural distortion in the posterior right lower lobe with cystic changes again seen. Consolidative masslike opacity adjacent to this area of bullous change has increased from the prior CT and measures approximately 3.3 x 2.2 cm (series 3, image 34). Additional patchy areas of consolidative opacity are present more inferiorly in the right lower lobe. There is progressive interlobular septal thickening in the basilar right lower lobe. 5 mm ground-glass left upper lobe nodule is unchanged (series 3, image 15). 2 mm left upper lobe subpleural nodule is unchanged (series 3, image 23).  Small calcifications in the right renal hilum may reflect a combination of vascular calcification and nonobstructing collecting system stones. Thoracolumbar spondylosis is noted.  IMPRESSION: 1. Progressive changes in the right lower lobe as above with a new masslike opacity measuring 3.3 x 2.2 cm. Given history, this is concerning for primary bronchogenic carcinoma, with additional ground-glass and consolidative opacities elsewhere in the right lower lobe possibly representing hemorrhage. Pneumonia superimposed on chronic aspiration might be an additional consideration, however further evaluation with PET-CT is  recommended given concern for malignancy. 2. Slightly increased size of 12 mm nodule in the right middle lobe, which may reflect slow growing primary lung adenocarcinoma.  These results will be called to the ordering clinician or representative by the Radiologist Assistant, and communication documented in the PACS or zVision Dashboard.   Electronically Signed   By: Logan Bores   On: 01/22/2014 16:17    Oren Binet, MD  [facility] Pager:[phone]  If 7PM-7AM, please contact night-coverage [email or web address] Password [email or web address] 01/26/2014, 12:59 PM   LOS: 1 day

## 2014-01-26 NOTE — Consult Note (Signed)
Name: [name] MRN: [id number] DOB: May 26, 1919    ADMISSION DATE:  01/25/2014 CONSULTATION DATE:  01/26/14  REFERRING MD :  Dr. Sloan Leiter   CHIEF COMPLAINT:  Hemoptysis   BRIEF PATIENT DESCRIPTION: 80 y/o F with known RLL lung mass admitted with small amount hemoptysis.  PCCM consulted for evaluation.    SIGNIFICANT EVENTS  11/02  Admit with hemoptysis   STUDIES:  04/09/07 PFT >> FEV1 1.75(107%), FEV1% 58, TLC 5.21(111%), DLCO 41%  03/23/13 ECHO >> mod LVH, EF 55 to 60%, mod AS, mild AR, mod MR, severe LA dilation, mild RA dilation, mod/sev TR, mod PR, PAS 71  10/30  CT Chest >> progressive RLL mass-like opacity, slight increase in RML nodule (40mm)   HISTORY OF PRESENT ILLNESS: 80 y.o. Female, ALF Resident, with PMH of macular degeneration, COPD, nocturnal hypoxia, and recurrent pneumonia. She is DNR/DNI.  Patient presented to [facility] ER on 11/2 with hemoptysis.  She has a known RLL lung mass and prior to admit was being treated with levaquin for possible aspiration vs post-obs PNA.  She reports approximately 2 months of cough with small amounts of hemoptysis that has gradually improved but not resolved.  Patient is aware of RLL lung mass and indicates she would not want any treatment even if the mass is lung cancer.    Patient reports shortness of breath with activity that is unchanged from baseline, approx 20 lb weight loss, chronic night sweats, and mid to upper back pain.  She reports hemoptysis is improved - mostly sputum with streaking at this point.  Cough is minimal "only when I feel like something needs to come up".  Denies chest pain, swelling, n/v/d, pain with inspiration.    PAST MEDICAL HISTORY :   has a past medical history of Cholelithiases; Hypothyroid; Leg paresthesia; Aortic valve stenosis; Arthritis; Hyperlipidemia; Dementia; Carotid artery occlusion; Renal disorder; Hypertension; COPD (chronic obstructive pulmonary disease); SEIZURE DISORDER (11/05/2006); GERD  (11/05/2006); CVA (02/07/2007); Macular degeneration of both eyes; Blind in both eyes; Peripheral neuropathy; Pneumonia; Internal hemorrhoid, bleeding; Malaria; Skin cancer; Chronic lower back pain; Intestinal infection due to Clostridium difficile (09/2011); Benign neoplasm of breast; Gout, unspecified; Anemia; Anxiety; Other dysfunctions of sleep stages or arousal from sleep; Depression; Chronic rhinitis; Abscess of lung(513.0); Diaphragmatic hernia without mention of obstruction or gangrene; Diverticulosis of colon (without mention of hemorrhage); Acute duodenal ulcer with perforation and obstruction (08/2011); Hemorrhage of rectum and anus; Abscess of intestine (09/2011); Other psoriasis; Lumbago; Nonspecific elevation of levels of transaminase or lactic acid dehydrogenase (LDH); Solitary pulmonary nodule; Transient ischemic attack (TIA), and cerebral infarction without residual deficits(V12.54); Long term (current) use of anticoagulants; Sacroiliitis (08/27/2012); Anemia, iron deficiency (10/16/2011); Congestive heart failure; Spondylosis of lumbosacral region (09/24/2012); Dysrhythmia; Arteritis; and Aortic stenosis, severe (03/02/2013).  has past surgical history that includes Vesicovaginal fistula closure w/ TAH; Tonsillectomy; Breast lumpectomy; Cataract extraction w/ intraocular lens  implant, bilateral; Skin cancer excision; Abdominal hysterectomy; and Hemorrhoid surgery (11/2008).   Prior to Admission medications   Medication Sig Start Date End Date Taking? Authorizing Provider  acetaminophen (TYLENOL) 325 MG tablet Take 650 mg by mouth every 6 (six) hours as needed (For pain.).    Yes Historical Provider, MD  acetaminophen (TYLENOL) 325 MG tablet Take 650 mg by mouth every 6 (six) hours as needed for mild pain.    Yes Historical Provider, MD  acyclovir ointment (ZOVIRAX) 5 % Apply 1 application topically every 3 (three) hours as needed. For cold sores   Yes Historical Provider,  MD  albuterol (PROVENTIL  HFA;VENTOLIN HFA) 108 (90 BASE) MCG/ACT inhaler Inhale 2 puffs into the lungs every 6 (six) hours as needed for wheezing or shortness of breath.   Yes Historical Provider, MD  aspirin EC 81 MG tablet Take 1 tablet (81 mg total) by mouth daily. 11/19/13  Yes Lelon Perla, MD  augmented betamethasone dipropionate (DIPROLENE-AF) 0.05 % ointment Apply 1 application topically 2 (two) times daily.   Yes Historical Provider, MD  betamethasone dipropionate (DIPROLENE) 0.05 % cream Apply 1 application topically 2 (two) times daily.   Yes Historical Provider, MD  cetirizine (ZYRTEC) 10 MG tablet Take 10 mg by mouth daily.   Yes Historical Provider, MD  chlorhexidine (PERIDEX) 0.12 % solution Use as directed 15 mLs in the mouth or throat. swish 15 mls for 30 sec then spit, twice daily   Yes Historical Provider, MD  Clocortolone Pivalate (CLODERM) 0.1 % cream Apply 1 application topically 2 (two) times daily. Applies to lesion on left cheek.   Yes Historical Provider, MD  coal tar (NEUTROGENA T-GEL) 0.5 % shampoo Apply 1 application topically See admin instructions. She uses on Tuesday and Friday.   Yes Historical Provider, MD  donepezil (ARICEPT) 10 MG tablet Take 10 mg by mouth at bedtime.    Yes Historical Provider, MD  Emollient (CERAVE) CREA Apply 1 application topically daily. Applies to dry skin.   Yes Historical Provider, MD  escitalopram (LEXAPRO) 20 MG tablet Take 20 mg by mouth every morning.    Yes Historical Provider, MD  furosemide (LASIX) 40 MG tablet Take 1 tablet (40 mg total) by mouth every morning. 11/07/13  Yes Eugenie Filler, MD  gabapentin (NEURONTIN) 100 MG capsule Take 200 mg by mouth at bedtime.    Yes Historical Provider, MD  HYDROcodone-acetaminophen (NORCO/VICODIN) 5-325 MG per tablet Take 1-2 tablets by mouth every 6 (six) hours as needed for moderate pain. 11/06/13  Yes Eugenie Filler, MD  hydrOXYzine (ATARAX/VISTARIL) 25 MG tablet Take 25 mg by mouth at bedtime.   Yes  Historical Provider, MD  levofloxacin (LEVAQUIN) 500 MG tablet Take 500 mg by mouth daily. Take for 10 days unsure of start date.   Yes Historical Provider, MD  levothyroxine (SYNTHROID, LEVOTHROID) 88 MCG tablet Take 88 mcg by mouth daily before breakfast.    Yes Historical Provider, MD  Multiple Vitamins-Minerals (PRESERVISION AREDS) CAPS Take 1 capsule by mouth 2 (two) times daily with a meal.    Yes Historical Provider, MD  mupirocin ointment (BACTROBAN) 2 % Place 1 application into the nose daily.   Yes Historical Provider, MD  olopatadine (PATANOL) 0.1 % ophthalmic solution Place 1 drop into both eyes 2 (two) times daily as needed. For dry eyes   Yes Historical Provider, MD  polyethylene glycol (MIRALAX / GLYCOLAX) packet Take 17 g by mouth every morning.    Yes Historical Provider, MD  potassium chloride SA (K-DUR,KLOR-CON) 20 MEQ tablet Take 40 mEq by mouth daily.   Yes Historical Provider, MD  pramoxine-mineral oil-zinc (TUCKS) 1-12.5 % rectal ointment Place 1 application rectally every 2 (two) hours as needed for itching (itching).   Yes Historical Provider, MD  pravastatin (PRAVACHOL) 40 MG tablet Take 1 tablet (40 mg total) by mouth every evening. 03/02/13  Yes Lelon Perla, MD  Propylene Glycol (SYSTANE BALANCE) 0.6 % SOLN Place 1 drop into both eyes daily as needed (For dry or itchy eyes.). One drop both eye as needed   Yes Historical Provider, MD  senna-docusate (SENOKOT S) 8.6-50 MG per tablet Take 1-2 tablets by mouth See admin instructions. She takes one tablet in the morning and two tablets at bedtime.   Yes Historical Provider, MD  tiotropium (SPIRIVA) 18 MCG inhalation capsule Place 18 mcg into inhaler and inhale daily.   Yes Historical Provider, MD  amoxicillin (AMOXIL) 500 MG capsule Take 2,000 mg by mouth once. Prior to dental procedure    Historical Provider, MD  enalapril (VASOTEC) 5 MG tablet Take 5 mg by mouth every morning.     Historical Provider, MD   Allergies    Allergen Reactions  . Cortisone Other (See Comments)    Rash   . Minocin [Minocycline Hcl] Other (See Comments)    Unknown  . Minocycline Hcl     unknown  . Neomycin-Bacitracin Zn-Polymyx     unknown  . Neosporin [Neomycin-Bacitracin Zn-Polymyx] Other (See Comments)    Unknown  . Other Other (See Comments)    Steroidal Neuromuscular Blockers  . Tetanus Toxoids Other (See Comments)    Unknown    FAMILY HISTORY:  family history includes Colon cancer in her daughter; Heart attack in her father; Heart disease in her son; Hyperlipidemia in an other family member; Hypertension in an other family member; Kidney failure in her father; Stroke in an other family member.   SOCIAL HISTORY:  reports that she quit smoking about 20 years ago. Her smoking use included Cigarettes. She has a 50 pack-year smoking history. She has never used smokeless tobacco. She reports that she drinks alcohol. She reports that she does not use illicit drugs.  REVIEW OF SYSTEMS:   Constitutional: Negative for fever, chills, weight loss, malaise/fatigue and diaphoresis.  HENT: Negative for hearing loss, ear pain, nosebleeds, congestion, sore throat, neck pain, tinnitus and ear discharge.   Eyes: Negative for blurred vision, double vision, photophobia, pain, discharge and redness.  Respiratory: Negative for sputum production, wheezing and stridor.  See HPI.   Cardiovascular: Negative for chest pain, palpitations, orthopnea, claudication, leg swelling and PND.  Gastrointestinal: Negative for heartburn, nausea, vomiting, abdominal pain, diarrhea, constipation, blood in stool and melena.  Genitourinary: Negative for dysuria, urgency, frequency, hematuria and flank pain.  Musculoskeletal: Negative for myalgias, back pain, joint pain and falls.  Skin: Negative for itching and rash.  Neurological: Negative for dizziness, tingling, tremors, sensory change, speech change, focal weakness, seizures, loss of consciousness,  weakness and headaches.  Endo/Heme/Allergies: Negative for environmental allergies and polydipsia. Does not bruise/bleed easily.  SUBJECTIVE:   VITAL SIGNS: Temp:  [97.7 F (36.5 C)-97.9 F (36.6 C)] 97.9 F (36.6 C) (11/03 0434) Pulse Rate:  [52-64] 52 (11/03 0434) Resp:  [17-21] 20 (11/03 0434) BP: (114-178)/(54-63) 140/60 mmHg (11/03 0434) SpO2:  [95 %-99 %] 96 % (11/03 0434) Weight:  [144 lb (65.318 kg)-147 lb (66.679 kg)] 147 lb (66.679 kg) (11/02 2107)  PHYSICAL EXAMINATION: General:  Frail elderly female in NAD Neuro:  AAOx4,speech clear, MAE HEENT:  Mm pin/moist, no jvd  Cardiovascular:  s1s2 rrr, SEM Lungs:  resp's even/non-labored on 2L O2, few faint crackles on R, L clear Abdomen:  Round/soft, bsx4 active  Musculoskeletal:  No acute deformities  Skin:  Warm/dry, no edema    Recent Labs Lab 01/25/14 1502 01/26/14 0531  NA 141 143  K 4.5 4.6  CL 105 110  CO2 21 19  BUN 27* 31*  CREATININE 2.09* 1.87*  GLUCOSE 108* 121*    Recent Labs Lab 01/25/14 1502 01/26/14 0531  HGB 11.6* 10.3*  HCT 37.0 32.8*  WBC 7.7 7.6  PLT 258 212   Dg Chest 2 View (if Patient Has Fever And/or Copd)  01/25/2014   CLINICAL DATA:  80 year old female with newly diagnosed lung mass and possible pneumonia, with hemoptysis.  EXAM: CHEST  2 VIEW  COMPARISON:  Chest x-ray 01/15/2014.  Chest CT 01/22/2014.  FINDINGS: Again noted is a mass like opacity in the posterior aspect of the right lower lobe, better demonstrated on recent chest CT. There are ill-defined opacities throughout the right lower lobe, likely to reflect some airspace consolidation, particularly from hemorrhage given the patient's history of hemoptysis. Alternatively, some of this could reflect some postobstructive pneumonitis or pneumonia. Left lung appears clear. No definite pleural effusions. Linear opacities in the upper lobes of the lungs bilaterally are similar to prior examinations, compatible with areas of chronic  scarring. Heart size is mildly enlarged. Moderate to large hiatal hernia again noted. Atherosclerosis in the thoracic aorta.  IMPRESSION: 1. Overall, the chest x-ray appears very similar to prior study 01/15/2014, with right lower lobe mass and airspace disease in the right lower lobe, which is favored to reflect some hemorrhage given the patient's history of hemoptysis, but may alternatively reflect some postobstructive changes.   Electronically Signed   By: Vinnie Langton M.D.   On: 01/25/2014 17:46    ASSESSMENT / PLAN:  Hemoptysis  RLL Mass & Airspace Disease COPD Former Tobacco Abuse Back Pain  Weight Loss  80 y/F with recent Rx for PNA with levaquin, mild hemoptysis, and known RLL mass admitted with hemoptysis for evaluation.  Patient does not want to seek treatment for RLL mass or tissue sampling for diagnosis.  DNR/DNI.  Mass has increased in size from 2013 from approx 1 cm to >2 cm with surrounding area of GGO.     Plan:  No biopsy / tissue sampling warranted at this time as patient would not want to undergo treatment if malignant Complete Rx for PNA Cough suppression PRN  Continue Spiriva, Albuterol  Monitor hemoptysis Follow up with Dr. Halford Chessman as outpatient.   Consider repeat imaging as outpatient to follow airspace disease Ensure supplement PRN Norco for back pain   Noe Gens, NP-C [facility] Pgr: [phone] or [phone]  01/26/2014, 10:41 AM  Reviewed above, and examined pt.  She has known hx of RLL nodule >> 1.1 cm in 2013.  This has increased in size to 3 cm.  She has hemoptysis and PNA.  In previous discussions with pt she has not wanted to pursue any diagnostics or therapies in the event she has lung cancer.  I explained that RLL lesions are most likely cancer.  She would not be surgical candidate given severity of her lung disease.  She would not want chemo or radiation therapy.  As such decision is to not pursue biopsy from RLL lesion.  Will  defer Abx to primary team.  Continue symptom management >> might benefit from palliative care/hospice assessment.  Chesley Mires, MD [facility] Pulmonary/Critical Care 01/26/2014, 11:51 AM Pager:  [phone] After 3pm call: [phone]

## 2014-01-27 ENCOUNTER — Inpatient Hospital Stay (HOSPITAL_COMMUNITY): Payer: Medicare Other

## 2014-01-27 ENCOUNTER — Telehealth: Payer: Self-pay | Admitting: Pulmonary Disease

## 2014-01-27 DIAGNOSIS — E039 Hypothyroidism, unspecified: Secondary | ICD-10-CM

## 2014-01-27 MED ORDER — AMLODIPINE BESYLATE 10 MG PO TABS
10.0000 mg | ORAL_TABLET | Freq: Every day | ORAL | Status: DC
  Administered 2014-01-28 – 2014-01-29 (×2): 10 mg via ORAL
  Filled 2014-01-27 (×2): qty 1

## 2014-01-27 MED ORDER — HYDROCOD POLST-CHLORPHEN POLST 10-8 MG/5ML PO LQCR
5.0000 mL | Freq: Two times a day (BID) | ORAL | Status: DC | PRN
  Administered 2014-01-27: 5 mL via ORAL
  Filled 2014-01-27: qty 5

## 2014-01-27 MED ORDER — PIPERACILLIN-TAZOBACTAM IN DEX 2-0.25 GM/50ML IV SOLN
2.2500 g | Freq: Three times a day (TID) | INTRAVENOUS | Status: DC
  Administered 2014-01-27 – 2014-01-28 (×3): 2.25 g via INTRAVENOUS
  Filled 2014-01-27 (×5): qty 50

## 2014-01-27 MED ORDER — IPRATROPIUM-ALBUTEROL 0.5-2.5 (3) MG/3ML IN SOLN
3.0000 mL | Freq: Three times a day (TID) | RESPIRATORY_TRACT | Status: DC
  Administered 2014-01-28 – 2014-01-29 (×4): 3 mL via RESPIRATORY_TRACT
  Filled 2014-01-27 (×4): qty 3

## 2014-01-27 MED ORDER — IPRATROPIUM-ALBUTEROL 0.5-2.5 (3) MG/3ML IN SOLN
3.0000 mL | Freq: Four times a day (QID) | RESPIRATORY_TRACT | Status: DC
  Administered 2014-01-27 (×3): 3 mL via RESPIRATORY_TRACT
  Filled 2014-01-27 (×3): qty 3

## 2014-01-27 MED ORDER — BENZONATATE 100 MG PO CAPS
200.0000 mg | ORAL_CAPSULE | Freq: Three times a day (TID) | ORAL | Status: DC | PRN
  Administered 2014-01-27 – 2014-01-28 (×2): 200 mg via ORAL
  Filled 2014-01-27 (×2): qty 2

## 2014-01-27 NOTE — Progress Notes (Signed)
0335 Patient call nurse to room stating she cough up a large amt of blood in the trash can. Tissue noted in the trash can to have moderate amount of bright red blood. Patient stated she awake with the blood in her mouth. Vital signs B/P 164/60 P 61 O2 sats 99% on 2l/m nasal cannula. Will continue to monitor closely.

## 2014-01-27 NOTE — Plan of Care (Signed)
Problem: Discharge Progression Outcomes Goal: Activity appropriate for discharge plan Outcome: Completed/Met Date Met:  01/27/14     

## 2014-01-27 NOTE — Progress Notes (Signed)
PATIENT DETAILS Name: [name] Age: 80 y.o. Sex: female Date of Birth: Jun 07, 1919 Admit Date: 01/25/2014 Admitting Physician Merton Border, MD ELF:YBOFBP,ZWCHEN D, MD  Subjective: Continues to have hemoptysis-worse last night per patient.  Assessment/Plan: Active Problems:  Hemoptysis:secondary to postobstructive pneumonia. Treat with IV Abx-switch to Zosyn for more anaerobic coverage. Hemoptysis unchanged.  Post Obstructive IDP:OEUMPNT above. Hemoptysis unchanged, afebrile. IV Abx as below.  Known RLL Lung Mass:likely malignancy (ex smoker). After d/w patient, she does not want treatment in form of chemo/RTx or surgery. No point in pursuing Bronchoscopy or Bx. Palliative eval ordered.  Hypothyroidism:on Levothyroxine, continue.  COPD:stable, continue Nebs and spiriva  Chronic Resp Failure:continue O2   IRW:ERXVQ cough-hold of on re-starting ACEI.Increase Amlodipine to 10 mg daily  Severe Aortic Stenosis:not a surgical candidate. Outpatient monitoring.  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 On ASA-but on hold due to hemoptysis.   Disposition: Remain inpatient  Antibiotics:  IV Vancomycin 11/2>>  IV Cefepime 11/2>>11/4  IV Zosyn 11/4>>  DVT Prophylaxis: SCD's  Code Status: DNR  Family Communication None at bedside  Procedures:  None  CONSULTS:  pulmonary/intensive care   MEDICATIONS: Scheduled Meds: . amLODipine  5 mg Oral Daily  . donepezil  10 mg Oral QHS  . escitalopram  20 mg Oral q morning - 10a  . gabapentin  200 mg Oral QHS  . ipratropium-albuterol  3 mL Nebulization Q6H  . levothyroxine  88 mcg Oral QAC breakfast  . loratadine  10 mg Oral Daily  . mupirocin ointment  1 application Nasal Daily  . olopatadine  1 drop Both Eyes BID  . piperacillin-tazobactam (ZOSYN)  IV  2.25 g Intravenous 3 times per day  . polyethylene glycol  17 g Oral q morning - 10a  . potassium chloride SA  40 mEq Oral Daily   . pravastatin  40 mg Oral QPM  . senna-docusate  1 tablet Oral Daily  . senna-docusate  2 tablet Oral QHS  . sodium chloride  3 mL Intravenous Q12H  . [START ON 01/28/2014] vancomycin  1,000 mg Intravenous Q48H   Continuous Infusions:   PRN Meds:.sodium chloride, acetaminophen **OR** acetaminophen, albuterol, benzonatate, chlorpheniramine-HYDROcodone, hydrALAZINE, HYDROcodone-acetaminophen, morphine injection, ondansetron **OR** ondansetron (ZOFRAN) IV, polyvinyl alcohol, pramoxine-mineral oil-zinc, sodium chloride, zolpidem  Antibiotics: Anti-infectives    Start     Dose/Rate Route Frequency Ordered Stop   01/28/14 0600  vancomycin (VANCOCIN) IVPB 1000 mg/200 mL premix     1,000 mg200 mL/hr over 60 Minutes Intravenous Every 48 hours 01/25/14 2047 02/02/14 2359   01/27/14 1000  piperacillin-tazobactam (ZOSYN) IVPB 2.25 g     2.25 g100 mL/hr over 30 Minutes Intravenous 3 times per day 01/27/14 0856     01/26/14 2000  ceFEPIme (MAXIPIME) 1 g in dextrose 5 % 50 mL IVPB  Status:  Discontinued     1 g100 mL/hr over 30 Minutes Intravenous Every 24 hours 01/25/14 2046 01/25/14 2136   01/25/14 2230  ceFEPIme (MAXIPIME) 1 g in dextrose 5 % 50 mL IVPB  Status:  Discontinued     1 g100 mL/hr over 30 Minutes Intravenous Every 24 hours 01/25/14 2136 01/27/14 0842   01/25/14 2200  ceFEPIme (MAXIPIME) 1 g in dextrose 5 % 50 mL IVPB  Status:  Discontinued     1 g100 mL/hr over 30 Minutes Intravenous 3 times per day 01/25/14 2106 01/25/14 2131   01/25/14 2000  vancomycin (VANCOCIN) IVPB 1000 mg/200  mL premix     1,000 mg200 mL/hr over 60 Minutes Intravenous  Once 01/25/14 1953 01/26/14 0529   01/25/14 2000  piperacillin-tazobactam (ZOSYN) IVPB 3.375 g     3.375 g100 mL/hr over 30 Minutes Intravenous  Once 01/25/14 1953 01/25/14 2109       PHYSICAL EXAM: Vital signs in last 24 hours: Filed Vitals:   01/27/14 0118 01/27/14 0335 01/27/14 0603 01/27/14 0948  BP: 152/60 164/60 166/64   Pulse: 66 61 55    Temp: 97.7 F (36.5 C)  97.9 F (36.6 C)   TempSrc: Oral  Oral   Resp:   18   Height:      Weight:      SpO2: 100% 99% 98% 100%    Weight change:  Filed Weights   01/25/14 1930 01/25/14 2107  Weight: 65.318 kg (144 lb) 66.679 kg (147 lb)   Body mass index is 25.22 kg/(m^2).   Gen Exam: Awake and alert with clear speech.  Neck: Supple, No JVD.   Chest: Bibasilar rales CVS: S1 S2 Regular, +3/6 syst murmur Abdomen: soft, BS +, non tender, non distended.  Extremities: no edema, lower extremities warm to touch. Neurologic: Non Focal.   Skin: No Rash.   Wounds: N/A.   Intake/Output from previous day:  Intake/Output Summary (Last 24 hours) at 01/27/14 0958 Last data filed at 01/26/14 1852  Gross per 24 hour  Intake    240 ml  Output      0 ml  Net    240 ml     LAB RESULTS: CBC  Recent Labs Lab 01/25/14 1502 01/26/14 0531  WBC 7.7 7.6  HGB 11.6* 10.3*  HCT 37.0 32.8*  PLT 258 212  MCV 89.4 89.4  MCH 28.0 28.1  MCHC 31.4 31.4  RDW 23.4* 23.8*  LYMPHSABS 1.4  --   MONOABS 0.6  --   EOSABS 0.5  --   BASOSABS 0.0  --     Chemistries   Recent Labs Lab 01/25/14 1502 01/26/14 0531  NA 141 143  K 4.5 4.6  CL 105 110  CO2 21 19  GLUCOSE 108* 121*  BUN 27* 31*  CREATININE 2.09* 1.87*  CALCIUM 9.2 9.2    CBG: No results for input(s): GLUCAP in the last 168 hours.  GFR Estimated Creatinine Clearance: 18 mL/min (by C-G formula based on Cr of 1.87).  Coagulation profile No results for input(s): INR, PROTIME in the last 168 hours.  Cardiac Enzymes No results for input(s): CKMB, TROPONINI, MYOGLOBIN in the last 168 hours.  Invalid input(s): CK  Invalid input(s): POCBNP No results for input(s): DDIMER in the last 72 hours. No results for input(s): HGBA1C in the last 72 hours. No results for input(s): CHOL, HDL, LDLCALC, TRIG, CHOLHDL, LDLDIRECT in the last 72 hours. No results for input(s): TSH, T4TOTAL, T3FREE, THYROIDAB in the last 72  hours.  Invalid input(s): FREET3 No results for input(s): VITAMINB12, FOLATE, FERRITIN, TIBC, IRON, RETICCTPCT in the last 72 hours. No results for input(s): LIPASE, AMYLASE in the last 72 hours.  Urine Studies No results for input(s): UHGB, CRYS in the last 72 hours.  Invalid input(s): UACOL, UAPR, USPG, UPH, UTP, UGL, UKET, UBIL, UNIT, UROB, ULEU, UEPI, UWBC, URBC, UBAC, CAST, UCOM, BILUA  MICROBIOLOGY: Recent Results (from the past 240 hour(s))  Culture, blood (routine x 2) Call MD if unable to obtain prior to antibiotics being given     Status: None (Preliminary result)   Collection Time: 01/25/14 11:55  PM  Result Value Ref Range Status   Specimen Description BLOOD RIGHT HAND  Final   Special Requests BOTTLES DRAWN AEROBIC ONLY 5CC  Final   Culture  Setup Time   Final    01/26/2014 08:59 Performed at [facility]    Culture   Final           BLOOD CULTURE RECEIVED NO GROWTH TO DATE CULTURE WILL BE HELD FOR 5 DAYS BEFORE ISSUING A FINAL NEGATIVE REPORT Performed at [facility]    Report Status PENDING  Incomplete  Culture, blood (routine x 2) Call MD if unable to obtain prior to antibiotics being given     Status: None (Preliminary result)   Collection Time: 01/26/14 12:01 AM  Result Value Ref Range Status   Specimen Description BLOOD LEFT HAND  Final   Special Requests BOTTLES DRAWN AEROBIC AND ANAEROBIC [facility] EACH  Final   Culture  Setup Time   Final    01/26/2014 08:59 Performed at [facility]    Culture   Final           BLOOD CULTURE RECEIVED NO GROWTH TO DATE CULTURE WILL BE HELD FOR 5 DAYS BEFORE ISSUING A FINAL NEGATIVE REPORT Performed at [facility]    Report Status PENDING  Incomplete  Culture, sputum-assessment     Status: None   Collection Time: 01/26/14  2:43 AM  Result Value Ref Range Status   Specimen Description SPUTUM  Final   Special Requests NONE  Final   Sputum evaluation   Final    MICROSCOPIC FINDINGS SUGGEST  THAT THIS SPECIMEN IS NOT REPRESENTATIVE OF LOWER RESPIRATORY SECRETIONS. PLEASE RECOLLECT. RESULT CALLED TO, READ BACK BY AND VERIFIED WITH: NOTIFIED Deeann Dowse RN [phone] GREEN R    Report Status 01/26/2014 FINAL  Final  Culture, expectorated sputum-assessment     Status: None   Collection Time: 01/26/14 10:23 PM  Result Value Ref Range Status   Specimen Description SPUTUM  Final   Special Requests Immunocompromised  Final   Sputum evaluation   Final    THIS SPECIMEN IS ACCEPTABLE. RESPIRATORY CULTURE REPORT TO FOLLOW.   Report Status 01/26/2014 FINAL  Final    RADIOLOGY STUDIES/RESULTS: Dg Chest 2 View (if Patient Has Fever And/or Copd)  01/25/2014   CLINICAL DATA:  80 year old female with newly diagnosed lung mass and possible pneumonia, with hemoptysis.  EXAM: CHEST  2 VIEW  COMPARISON:  Chest x-ray 01/15/2014.  Chest CT 01/22/2014.  FINDINGS: Again noted is a mass like opacity in the posterior aspect of the right lower lobe, better demonstrated on recent chest CT. There are ill-defined opacities throughout the right lower lobe, likely to reflect some airspace consolidation, particularly from hemorrhage given the patient's history of hemoptysis. Alternatively, some of this could reflect some postobstructive pneumonitis or pneumonia. Left lung appears clear. No definite pleural effusions. Linear opacities in the upper lobes of the lungs bilaterally are similar to prior examinations, compatible with areas of chronic scarring. Heart size is mildly enlarged. Moderate to large hiatal hernia again noted. Atherosclerosis in the thoracic aorta.  IMPRESSION: 1. Overall, the chest x-ray appears very similar to prior study 01/15/2014, with right lower lobe mass and airspace disease in the right lower lobe, which is favored to reflect some hemorrhage given the patient's history of hemoptysis, but may alternatively reflect some postobstructive changes.   Electronically Signed   By: Vinnie Langton  M.D.   On: 01/25/2014 17:46   Dg  Chest 2 View  01/15/2014   CLINICAL DATA:  Hemoptysis with COPD in shortness of breath, subsequent encounter.  EXAM: CHEST  2 VIEW  COMPARISON:  01/29/2013.  FINDINGS: Lungs are hyperexpanded with scattered areas of upper and lower lung scarring. Patchy opacity overlying the lower spine on the lateral film is probably related airspace disease although this is not easily localized on the frontal projection which appears very similar to the earlier study. The cardio pericardial silhouette is enlarged. Hiatal hernia again noted. Bones are diffusely demineralized.  IMPRESSION: Emphysema with patchy opacity over the posterior lung bases on the lateral film. This is probably airspace disease and pneumonia would be a consideration. Less likely, there could be a sclerotic lesion in the T10 vertebral body. Repeat two view chest x-ray after treatment could be used to ensure resolution. Alternatively a CT scan of the chest could be used to further evaluate as clinically warranted.   Electronically Signed   By: Misty Stanley M.D.   On: 01/15/2014 15:46   Ct Chest Wo Contrast  01/22/2014   CLINICAL DATA:  Aspiration pneumonia. Hemoptysis, shortness of breath, and 20 lb weight loss.  BUN and creatinine were obtained on site at [place] at  [address]  Results:  BUN 25 mg/dL,  Creatinine 1.84 mg/dL.  EXAM: CT CHEST WITHOUT CONTRAST  TECHNIQUE: Multidetector CT imaging of the chest was performed following the standard protocol without IV contrast.  COMPARISON:  Chest radiographs 01/15/2014 and CT 10/08/2011  FINDINGS: No enlarged axillary, mediastinal, or hilar lymph nodes are identified. Extensive, diffuse atherosclerotic calcification is noted of the visualized aorta and coronary arteries. There is trace pericardial fluid. Heart is again noted to be mildly enlarged. Aortic valve and mitral annular calcifications are also noted. There is a moderately large hiatal hernia.   There is no pleural effusion. Centrilobular emphysema is again seen. Peripheral curvilinear opacities in the peripheral aspects of the upper lobes are similar to the prior study and likely represent scarring. 12 mm sub solid nodule in the right middle lobe has minimally increased in size (series 3, image 40, previously 10 mm).  There is new ground-glass opacity throughout the medial left lower lobe. There is progressive architectural distortion in the posterior right lower lobe with cystic changes again seen. Consolidative masslike opacity adjacent to this area of bullous change has increased from the prior CT and measures approximately 3.3 x 2.2 cm (series 3, image 34). Additional patchy areas of consolidative opacity are present more inferiorly in the right lower lobe. There is progressive interlobular septal thickening in the basilar right lower lobe. 5 mm ground-glass left upper lobe nodule is unchanged (series 3, image 15). 2 mm left upper lobe subpleural nodule is unchanged (series 3, image 23).  Small calcifications in the right renal hilum may reflect a combination of vascular calcification and nonobstructing collecting system stones. Thoracolumbar spondylosis is noted.  IMPRESSION: 1. Progressive changes in the right lower lobe as above with a new masslike opacity measuring 3.3 x 2.2 cm. Given history, this is concerning for primary bronchogenic carcinoma, with additional ground-glass and consolidative opacities elsewhere in the right lower lobe possibly representing hemorrhage. Pneumonia superimposed on chronic aspiration might be an additional consideration, however further evaluation with PET-CT is recommended given concern for malignancy. 2. Slightly increased size of 12 mm nodule in the right middle lobe, which may reflect slow growing primary lung adenocarcinoma.  These results will be called to the ordering clinician or representative by the  Psychologist, clinical, and communication documented in the  PACS or zVision Dashboard.   Electronically Signed   By: Logan Bores   On: 01/22/2014 16:17    Oren Binet, MD  [facility] Pager:[phone]  If 7PM-7AM, please contact night-coverage [email or web address] Password [email or web address] 01/27/2014, 9:58 AM   LOS: 2 days

## 2014-01-27 NOTE — Telephone Encounter (Signed)
I spoke with pt's daughter in hospital.

## 2014-01-27 NOTE — Consult Note (Signed)
Name: Cecia Egge MRN: [id number] DOB: 09-08-1919    ADMISSION DATE:  01/25/2014 CONSULTATION DATE:  01/26/14  REFERRING MD :  Dr. Sloan Leiter   CHIEF COMPLAINT:  Hemoptysis   BRIEF PATIENT DESCRIPTION: 80 y/o F with known RLL lung mass admitted with small amount hemoptysis.  PCCM consulted for evaluation.    SIGNIFICANT EVENTS  11/02  Admit with hemoptysis   STUDIES:  04/09/07 PFT >> FEV1 1.75(107%), FEV1% 58, TLC 5.21(111%), DLCO 41%  03/23/13 ECHO >> mod LVH, EF 55 to 60%, mod AS, mild AR, mod MR, severe LA dilation, mild RA dilation, mod/sev TR, mod PR, PAS 71  10/30  CT Chest >> progressive RLL mass-like opacity, slight increase in RML nodule (81mm)  SUBJECTIVE:  Coughed up blood again last night ? Old blood.  Feels better this AM.  VITAL SIGNS: Temp:  [97.7 F (36.5 C)-98 F (36.7 C)] 97.9 F (36.6 C) (11/04 0603) Pulse Rate:  [55-66] 55 (11/04 0603) Resp:  [18] 18 (11/04 0603) BP: (151-196)/(44-76) 166/64 mmHg (11/04 0603) SpO2:  [98 %-100 %] 100 % (11/04 0948)  PHYSICAL EXAMINATION: General:  Frail elderly female in NAD Neuro:  AAOx4,speech clear, MAE HEENT:  Mm pin/moist, no jvd  Cardiovascular:  s1s2 rrr, SEM Lungs:  resp's even/non-labored on 2L O2, few faint crackles on R, L clear Abdomen:  Round/soft, bsx4 active  Musculoskeletal:  No acute deformities  Skin:  Warm/dry, no edema    Recent Labs Lab 01/25/14 1502 01/26/14 0531  NA 141 143  K 4.5 4.6  CL 105 110  CO2 21 19  BUN 27* 31*  CREATININE 2.09* 1.87*  GLUCOSE 108* 121*    Recent Labs Lab 01/25/14 1502 01/26/14 0531  HGB 11.6* 10.3*  HCT 37.0 32.8*  WBC 7.7 7.6  PLT 258 212   Dg Chest 2 View (if Patient Has Fever And/or Copd)  01/25/2014   CLINICAL DATA:  80 year old female with newly diagnosed lung mass and possible pneumonia, with hemoptysis.  EXAM: CHEST  2 VIEW  COMPARISON:  Chest x-ray 01/15/2014.  Chest CT 01/22/2014.  FINDINGS: Again noted is a mass like opacity in the  posterior aspect of the right lower lobe, better demonstrated on recent chest CT. There are ill-defined opacities throughout the right lower lobe, likely to reflect some airspace consolidation, particularly from hemorrhage given the patient's history of hemoptysis. Alternatively, some of this could reflect some postobstructive pneumonitis or pneumonia. Left lung appears clear. No definite pleural effusions. Linear opacities in the upper lobes of the lungs bilaterally are similar to prior examinations, compatible with areas of chronic scarring. Heart size is mildly enlarged. Moderate to large hiatal hernia again noted. Atherosclerosis in the thoracic aorta.  IMPRESSION: 1. Overall, the chest x-ray appears very similar to prior study 01/15/2014, with right lower lobe mass and airspace disease in the right lower lobe, which is favored to reflect some hemorrhage given the patient's history of hemoptysis, but may alternatively reflect some postobstructive changes.   Electronically Signed   By: Vinnie Langton M.D.   On: 01/25/2014 17:46    ASSESSMENT / PLAN:  Hemoptysis 2nd to HCAP and RLL mass (likely malignancy) Plan: Continue Abx per primary team F/u CXR 11/05 If hemoptysis persists, then she might need to have intervention >> otherwise pt would not want evaluation/therapy for RLL mass  Hx of COPD/emphysema. Plan: Continue duoneb  Back pain. Plan: Per primary team  Updated pt's daughter at bedside.   Chesley Mires, MD [facility] Pulmonary/Critical Care 01/27/2014,  11:57 AM Pager:  [phone] After 3pm call: [phone]

## 2014-01-27 NOTE — Evaluation (Signed)
Physical Therapy Evaluation Patient Details Name: [name] MRN: [id number] DOB: 1919-10-09 Today's Date: 01/27/2014   History of Present Illness  80 y.o. female  resident at [facility] independent living facility, with history of macular degeneration and blindness, chronic respiratory failure on home O2 (at night), atrial fibrillation and carotid artery occlusion, history of TIA, aortic valve stenosis, dementia, hypertension, admitted 01/25/14 with Rt lung mass (likely Ca with pt not wanting to pursue testing) and pneumonia  Clinical Impression  Patient evaluated by Physical Therapy with no further acute PT needs identified. All education has been completed and the patient has no further questions. Pt is very independent and demonstrated safe use of her 4 wheeled RW (even into hospital bathroom with tall/slanted threshold and sloping bathroom floor). Pt recently discharged from [facility] and does not want to resume PT. PT is signing off. Thank you for this referral. *Note-Pt not wearing O2 on arrival. SaO2 at rest 90-91%; with activity decr to 86% on room air (pt refused to use O2) and recovered to 89% after 3 minutes. After prolonged seated rest incr to 93%    Follow Up Recommendations No PT follow up;Supervision - Intermittent (care as she had PTA)    Equipment Recommendations  None recommended by PT    Recommendations for Other Services       Precautions / Restrictions        Mobility  Bed Mobility               General bed mobility comments: pt sitting on EOB on arrival; reports she can do independently  Transfers Overall transfer level: Modified independent Equipment used: 4-wheeled walker             General transfer comment: x2 with safe technique  Ambulation/Gait Ambulation/Gait assistance: Supervision;Modified independent (Device/Increase time) Ambulation Distance (Feet): 20 Feet (tnen 10) Assistive device: 4-wheeled walker Gait Pattern/deviations:  Step-through pattern;Trunk flexed     General Gait Details: pt with kyphotic posture that she can only partially reverse with cues; chronic back pain  Stairs            Wheelchair Mobility    Modified Rankin (Stroke Patients Only)       Balance Overall balance assessment: No apparent balance deficits (not formally assessed)                                           Pertinent Vitals/Pain Pain Assessment: Faces Faces Pain Scale: Hurts a little bit Pain Location: back Pain Intervention(s): Limited activity within patient's tolerance;Monitored during session;Repositioned    Home Living Family/patient expects to be discharged to:: Private residence (independent living) Living Arrangements: Alone Available Help at Discharge: Family;Personal care attendant;Available PRN/intermittently Type of Home: Independent living facility Home Access: Level entry     Home Layout: One level Home Equipment: Wheelchair - Chiropodist - 2 wheels (reports [name] is to put in a taller toilet for her)      Prior Function Level of Independence: Needs assistance   Gait / Transfers Assistance Needed: uses walker in apt and scooter or w/c in community  ADL's / Homemaking Assistance Needed: pt has caregiver 4 hours per day to help with laundry, meals and as needed with ADL. Pt usually can do own bath, dress.         Hand Dominance        Extremity/Trunk Assessment   Upper Extremity  Assessment: Overall WFL for tasks assessed           Lower Extremity Assessment: Overall WFL for tasks assessed      Cervical / Trunk Assessment: Kyphotic  Communication   Communication: No difficulties  Cognition Arousal/Alertness: Awake/alert Behavior During Therapy: WFL for tasks assessed/performed Overall Cognitive Status: No family/caregiver present to determine baseline cognitive functioning                      General Comments General  comments (skin integrity, edema, etc.): fiercely independent; reports she moved to rehab area of [name] 2 days PTA due to illness, but plans to return to her apartment    Exercises        Assessment/Plan    PT Assessment Patent does not need any further PT services  PT Diagnosis Generalized weakness   PT Problem List    PT Treatment Interventions     PT Goals (Current goals can be found in the Care Plan section) Acute Rehab PT Goals PT Goal Formulation: All assessment and education complete, DC therapy    Frequency     Barriers to discharge        Co-evaluation               End of Session Equipment Utilized During Treatment: Gait belt (pt refused O2) Activity Tolerance: Patient tolerated treatment well Patient left: in chair;with call bell/phone within reach;with nursing/sitter in room Nurse Communication: Mobility status (refuses chair alarm)         Time: 4599-7741 PT Time Calculation (min): 29 min   Charges:   PT Evaluation $Initial PT Evaluation Tier I: 1 Procedure PT Treatments $Gait Training: 8-22 mins   PT G Codes:          [name] 01-30-14, 11:18 AM Pager [phone]

## 2014-01-27 NOTE — Progress Notes (Signed)
ANTIBIOTIC CONSULT NOTE - FOLLOW UP  Pharmacy Consult:  Vancomycin / Cefepime >> Zosyn Indication:  Post-obstructive PNA  Allergies  Allergen Reactions  . Cortisone Other (See Comments)    Rash   . Minocin [Minocycline Hcl] Other (See Comments)    Unknown  . Minocycline Hcl     unknown  . Neomycin-Bacitracin Zn-Polymyx     unknown  . Neosporin [Neomycin-Bacitracin Zn-Polymyx] Other (See Comments)    Unknown  . Other Other (See Comments)    Steroidal Neuromuscular Blockers  . Tetanus Toxoids Other (See Comments)    Unknown    Patient Measurements: Height: 5\' 4"  (162.6 cm) Weight: 147 lb (66.679 kg) IBW/kg (Calculated) : 54.7  Vital Signs: Temp: 97.9 F (36.6 C) (11/04 0603) Temp Source: Oral (11/04 0603) BP: 166/64 mmHg (11/04 0603) Pulse Rate: 55 (11/04 0603) Intake/Output from previous day: 11/03 0701 - 11/04 0700 In: 240 [P.O.:240] Out: -  Intake/Output from this shift:    Labs:  Recent Labs  01/25/14 1502 01/26/14 0531  WBC 7.7 7.6  HGB 11.6* 10.3*  PLT 258 212  CREATININE 2.09* 1.87*   Estimated Creatinine Clearance: 18 mL/min (by C-G formula based on Cr of 1.87). No results for input(s): VANCOTROUGH, VANCOPEAK, VANCORANDOM, GENTTROUGH, GENTPEAK, GENTRANDOM, TOBRATROUGH, TOBRAPEAK, TOBRARND, AMIKACINPEAK, AMIKACINTROU, AMIKACIN in the last 72 hours.   Microbiology: Recent Results (from the past 720 hour(s))  Culture, blood (routine x 2) Call MD if unable to obtain prior to antibiotics being given     Status: None (Preliminary result)   Collection Time: 01/25/14 11:55 PM  Result Value Ref Range Status   Specimen Description BLOOD RIGHT HAND  Final   Special Requests BOTTLES DRAWN AEROBIC ONLY 5CC  Final   Culture  Setup Time   Final    01/26/2014 08:59 Performed at [facility]    Culture   Final           BLOOD CULTURE RECEIVED NO GROWTH TO DATE CULTURE WILL BE HELD FOR 5 DAYS BEFORE ISSUING A FINAL NEGATIVE REPORT Performed at [facility]    Report Status PENDING  Incomplete  Culture, blood (routine x 2) Call MD if unable to obtain prior to antibiotics being given     Status: None (Preliminary result)   Collection Time: 01/26/14 12:01 AM  Result Value Ref Range Status   Specimen Description BLOOD LEFT HAND  Final   Special Requests BOTTLES DRAWN AEROBIC AND ANAEROBIC [facility] EACH  Final   Culture  Setup Time   Final    01/26/2014 08:59 Performed at [facility]    Culture   Final           BLOOD CULTURE RECEIVED NO GROWTH TO DATE CULTURE WILL BE HELD FOR 5 DAYS BEFORE ISSUING A FINAL NEGATIVE REPORT Performed at [facility]    Report Status PENDING  Incomplete  Culture, sputum-assessment     Status: None   Collection Time: 01/26/14  2:43 AM  Result Value Ref Range Status   Specimen Description SPUTUM  Final   Special Requests NONE  Final   Sputum evaluation   Final    MICROSCOPIC FINDINGS SUGGEST THAT THIS SPECIMEN IS NOT REPRESENTATIVE OF LOWER RESPIRATORY SECRETIONS. PLEASE RECOLLECT. RESULT CALLED TO, READ BACK BY AND VERIFIED WITH: NOTIFIED Deeann Dowse RN [phone] [phone] GREEN R    Report Status 01/26/2014 FINAL  Final  Culture, expectorated sputum-assessment     Status: None   Collection Time: 01/26/14 10:23 PM  Result Value Ref Range Status   Specimen Description SPUTUM  Final   Special Requests Immunocompromised  Final   Sputum evaluation   Final    THIS SPECIMEN IS ACCEPTABLE. RESPIRATORY CULTURE REPORT TO FOLLOW.   Report Status 01/26/2014 FINAL  Final      Assessment: [name] with pos-obstructive PNA to continue vancomycin and switch from cefepime to Zosyn.  Patient with renal insufficiency.  Vanc 11/3 >> Cefepime 11/2 >> 11/4 Zosyn 11/4 >> LVQ PTA  11/2 BCx x2 - NGTD 11/3 resp cx - negative   Goal of Therapy:  Vancomycin trough level 15-20 mcg/ml   Plan:  - Continue Vanc 1gm IV Q48H x8 days total - Zosyn 2.25gm IV Q8H - Monitor renal fxn, clinical progress,  vanc trough if indicated - Watch K+    Ellionna Buckbee D. Mina Marble, PharmD, BCPS Pager:  [phone] 01/27/2014, 8:55 AM

## 2014-01-27 NOTE — Telephone Encounter (Signed)
Called pt daughter. She has several questions she wants to ask VS. She reports pt "hemorraghed 1/2 cup of blood" last night. Daughters questions are:  1) Is pt going to continue like the state she is in now? 2) Should they expect the pain in pt back to get worse from the mass? 3) What can be done for her back pain and how to manage the back pain as it progresses? 4) if the mass progresses will it be slow or rapid? 5) Since pt refuses testing, do VS feel this is the best as well? 6) Is the mass malignant?  7) is the mass causing her hemoptysis? 8) Will pt be able to still live in her appt alone at the assisted living or will she need to watched? 9) is this the beginning of the end?  Please advise VS thanks

## 2014-01-27 NOTE — Plan of Care (Signed)
Problem: Phase I Progression Outcomes Goal: Dyspnea controlled at rest Outcome: Completed/Met Date Met:  01/27/14 Goal: Pain controlled with appropriate interventions Outcome: Completed/Met Date Met:  01/27/14 Goal: OOB as tolerated unless otherwise ordered Outcome: Completed/Met Date Met:  01/27/14 Goal: First antibiotic given within 6hrs of admit Outcome: Completed/Met Date Met:  01/27/14 Goal: Confirm chest x-ray completed Outcome: Completed/Met Date Met:  01/27/14 Goal: Code status addressed with pt/family Outcome: Completed/Met Date Met:  01/27/14 Goal: Voiding-avoid urinary catheter unless indicated Outcome: Completed/Met Date Met:  01/27/14 Goal: Hemodynamically stable Outcome: Completed/Met Date Met:  01/27/14  Problem: Phase II Progression Outcomes Goal: Wean O2 if indicated Outcome: Completed/Met Date Met:  01/27/14 Goal: Pain controlled Outcome: Completed/Met Date Met:  01/27/14 Goal: Progress activity as tolerated unless otherwise ordered Outcome: Completed/Met Date Met:  01/27/14 Goal: Tolerating diet Outcome: Completed/Met Date Met:  01/27/14 Goal: If HF, initiate HF Core Reminder Form Outcome: Not Applicable Date Met:  96/28/36  Problem: Phase III Progression Outcomes Goal: O2 sats > or equal to 93% on room air Outcome: Completed/Met Date Met:  01/27/14 Goal: Pain controlled on oral analgesia Outcome: Completed/Met Date Met:  01/27/14 Goal: Activity at appropriate level-compared to baseline (UP IN CHAIR FOR HEMODIALYSIS)  Outcome: Completed/Met Date Met:  01/27/14 Goal: Tolerating diet Outcome: Completed/Met Date Met:  01/27/14

## 2014-01-28 ENCOUNTER — Institutional Professional Consult (permissible substitution): Payer: Medicare Other | Admitting: Emergency Medicine

## 2014-01-28 ENCOUNTER — Inpatient Hospital Stay (HOSPITAL_COMMUNITY): Payer: Medicare Other

## 2014-01-28 DIAGNOSIS — J189 Pneumonia, unspecified organism: Secondary | ICD-10-CM | POA: Insufficient documentation

## 2014-01-28 DIAGNOSIS — R918 Other nonspecific abnormal finding of lung field: Secondary | ICD-10-CM | POA: Insufficient documentation

## 2014-01-28 DIAGNOSIS — J449 Chronic obstructive pulmonary disease, unspecified: Secondary | ICD-10-CM | POA: Insufficient documentation

## 2014-01-28 DIAGNOSIS — R042 Hemoptysis: Secondary | ICD-10-CM

## 2014-01-28 LAB — CBC
HCT: 33 % — ABNORMAL LOW (ref 36.0–46.0)
Hemoglobin: 10.2 g/dL — ABNORMAL LOW (ref 12.0–15.0)
MCH: 28.5 pg (ref 26.0–34.0)
MCHC: 30.9 g/dL (ref 30.0–36.0)
MCV: 92.2 fL (ref 78.0–100.0)
PLATELETS: 220 10*3/uL (ref 150–400)
RBC: 3.58 MIL/uL — AB (ref 3.87–5.11)
RDW: 22.5 % — AB (ref 11.5–15.5)
WBC: 7.5 10*3/uL (ref 4.0–10.5)

## 2014-01-28 LAB — BASIC METABOLIC PANEL
Anion gap: 13 (ref 5–15)
BUN: 21 mg/dL (ref 6–23)
CHLORIDE: 105 meq/L (ref 96–112)
CO2: 21 meq/L (ref 19–32)
Calcium: 9.6 mg/dL (ref 8.4–10.5)
Creatinine, Ser: 1.26 mg/dL — ABNORMAL HIGH (ref 0.50–1.10)
GFR calc non Af Amer: 36 mL/min — ABNORMAL LOW (ref >90)
GFR, EST AFRICAN AMERICAN: 42 mL/min — AB (ref >90)
Glucose, Bld: 100 mg/dL — ABNORMAL HIGH (ref 70–99)
POTASSIUM: 5.3 meq/L (ref 3.7–5.3)
Sodium: 139 mEq/L (ref 137–147)

## 2014-01-28 MED ORDER — LIDOCAINE 5 % EX PTCH
1.0000 | MEDICATED_PATCH | CUTANEOUS | Status: DC
  Administered 2014-01-29: 1 via TRANSDERMAL
  Filled 2014-01-28 (×2): qty 1

## 2014-01-28 MED ORDER — LIDOCAINE 5 % EX PTCH
1.0000 | MEDICATED_PATCH | Freq: Once | CUTANEOUS | Status: AC
  Administered 2014-01-28: 1 via TRANSDERMAL
  Filled 2014-01-28 (×2): qty 1

## 2014-01-28 MED ORDER — LIDOCAINE 5 % EX PTCH: 1.0000 | MEDICATED_PATCH | CUTANEOUS | Status: DC

## 2014-01-28 MED ORDER — PIPERACILLIN-TAZOBACTAM 3.375 G IVPB
3.3750 g | Freq: Three times a day (TID) | INTRAVENOUS | Status: DC
  Administered 2014-01-28 – 2014-01-29 (×3): 3.375 g via INTRAVENOUS
  Filled 2014-01-28 (×6): qty 50

## 2014-01-28 MED ORDER — VANCOMYCIN HCL IN DEXTROSE 1-5 GM/200ML-% IV SOLN
1000.0000 mg | INTRAVENOUS | Status: DC
  Administered 2014-01-29: 1000 mg via INTRAVENOUS
  Filled 2014-01-28 (×2): qty 200

## 2014-01-28 NOTE — Progress Notes (Signed)
Full note to follow:  I met today with [name], her daughter [name], and her private caregiver. She has a wonderful outlook on life. She was in the restroom when I first came into the room and her daughter asked me to call it a mass and not a tumor or cancer and not to mention hospice as this will upset her mother. When [name] came out and sat down with me I asked her to explain what has been going on and she tells me "I'm 89 and dying of cancer." [name] seems to have a very good outlook and is comfortable with the idea of her dying but her daughter appears to be the one more uncomfortable with this process. She makes many jokes but would like to improve her quality of life. Her main complaint is her lower back pain. She has Vicodin but does not like to take this as it makes her sleepy and she doesn't like to "take medicine." She agrees to try Lidoderm patch and is happy with this idea and hopeful it will help some. She also wants a smaller portable oxygen so she can get out more - she says she has been waiting for months for this with her agency. She would like help from [facility] to look into another company to supply this small portable oxygen. They also agree for palliative care to help with symptom management as outpatient and I would recommend as a transition to hospice. I have a feeling that [name] may be open to hospice but this will have to be further discussed with her. I feel like she would be okay with having that conversation. I will continue to follow.  Karen Sill, NP Palliative Medicine Team Pager # [phone] (M-F 8a-5p) Team Phone # [phone] (Nights/Weekends)

## 2014-01-28 NOTE — Consult Note (Signed)
Patient [name]      DOB: [birth year]      CZY:[id number]     Consult Note from the Palliative Medicine Team at [facility] Requested by: Dr. Sloan Leiter     PCP: Kandice Hams, MD Reason for Consultation: [place], options     Phone Number:[phone]  Assessment of patients Current state: I met today with [name], her daughter [name], and her private caregiver. She has a wonderful outlook on life. She was in the restroom when I first came into the room and her daughter asked me to call it a mass and not a tumor or cancer and not to mention hospice as this will upset her mother. When [name] came out and sat down with me I asked her to explain what has been going on and she tells me "I'm 80 and dying of cancer." [name] seems to have a very good outlook and is comfortable with the idea of her dying. She makes many jokes and seems quite happy and content but would like to improve her quality of life. Her main complaint is her lower back pain. She has Vicodin but does not like to take this as it makes her sleepy and she doesn't like to "take medicine." She agrees to try Lidoderm patch and is happy with this idea and hopeful it will help some. She also wants a smaller portable oxygen so she can get out more - she says she has been waiting for months for this with her agency. She would like help from [facility] to look into another company to supply this small portable oxygen. They also agree for palliative care to help with symptom management as outpatient and I would recommend as a transition to hospice. I have a feeling that [name] may be open to hospice but this will have to be further discussed with her. I feel like she would be okay with having that conversation. I will continue to follow.   Goals of Care: 1.  Code Status: DNR   2. Scope of Treatment: Continue current treatments for pneumonia and hemoptysis. She is focused on comfort but also  wants to be awake and able to participate in life. She goes out to get her hair done twice a week and enjoys shopping and eating she says. She would like to be able to manage her back pain and shortness of breath to get out more to do these things.    4. Disposition: Return to [facility]F [facility]. She agrees to outpatient palliative care to follow her there for continued goals of care and symptom management.    3. Symptom Management:   1. Pain: Lidoderm patch to lower back. Vicodin 5-325 1-2 tablets every 4 hours prn moderate pain. Morphine 1 mg IV every 4 hours.  2. Bowel Regimen: Senokot-S daily and qhs. Miralax daily.  3. Sleep: Ambien qhs prn.  4. Cough: Tussionex every 12 hours prn.  5. Fever: Acetaminophen prn.  6. Nausea/Vomiting: Ondansetron prn.   4. Psychosocial: Emotional support provided to patient and family at bedside.    Patient Documents Completed or Given: Document Given Completed  Advanced Directives Pkt    MOST    DNR    Gone from My Sight    Hard Choices Yes- to daughter     Brief HPI: 80 yo female admitted with worsening hemoptysis r/t post obstructive pneumonia with RLL lung mass and also underlying COPD. No treatment of chemotherapy/radiation/surgery desired. PMH significant  for COPD, severe aortic stenosis, hypothyroidism, HTN, seizures, peripheral neuropathy, chronic lower back pain, gout, malaria, anemia, anxiety, diverticulosis, depression, TIA, CVA, CHF, atrial fibrillation.    ROS: + lower back pain, + shortness of breath with activity    PMH:  Past Medical History  Diagnosis Date  . Cholelithiases   . Hypothyroid   . Leg paresthesia   . Aortic valve stenosis   . Arthritis   . Hyperlipidemia   . Dementia   . Carotid artery occlusion   . Renal disorder   . Hypertension   . COPD (chronic obstructive pulmonary disease)   . SEIZURE DISORDER 11/05/2006  . GERD 11/05/2006  . CVA 02/07/2007  . Macular degeneration of both eyes   . Blind in both  eyes   . Peripheral neuropathy   . Pneumonia     "many times"  . Internal hemorrhoid, bleeding   . Malaria     "as a child"  . Skin cancer     "nose, face mostly"  . Chronic lower back pain     "when I go to the bathroom"  . Intestinal infection due to Clostridium difficile 09/2011  . Benign neoplasm of breast   . Gout, unspecified   . Anemia   . Anxiety   . Other dysfunctions of sleep stages or arousal from sleep   . Depression   . Chronic rhinitis   . Abscess of lung(513.0)     RUL  . Diaphragmatic hernia without mention of obstruction or gangrene   . Diverticulosis of colon (without mention of hemorrhage)   . Acute duodenal ulcer with perforation and obstruction 08/2011    perforation  . Hemorrhage of rectum and anus   . Abscess of intestine 09/2011    drainage  . Other psoriasis   . Lumbago   . Nonspecific elevation of levels of transaminase or lactic acid dehydrogenase (LDH)   . Solitary pulmonary nodule     RLL nodule 1 cm  . Transient ischemic attack (TIA), and cerebral infarction without residual deficits(V12.54)   . Long term (current) use of anticoagulants   . Sacroiliitis 08/27/2012  . Anemia, iron deficiency 10/16/2011  . Congestive heart failure   . Spondylosis of lumbosacral region 09/24/2012    L/S xray 08/2012: Mild to moderate osteoarthritis and degenerative spondylosis he diffusely. Anterior subluxation of L4 relative to L5 at 5.7 cm. Mild to moderate levoconvex curvature the mid lumbar spine    . Dysrhythmia     hx atrial fib  . Arteritis     hx giant cell arteritis  . Aortic stenosis, severe 03/02/2013    Followed since 2004 with mild aortic valve stenosis and mild regurgitation. Echocardiograms 2006-2013 show  progression from mild to moderate aortic stenosis, mild insufficiency. Mild to moderate mitral valve regurgitation evident in 2013.   07/25/11: Worsening valve disease by echocardiogram, pt is not interested in invasive treatment. Symptoms appear stable  for now. Continue to monitor        PSH: Past Surgical History  Procedure Laterality Date  . Vesicovaginal fistula closure w/ tah    . Tonsillectomy    . Breast lumpectomy      bilaterally  . Cataract extraction w/ intraocular lens  implant, bilateral    . Skin cancer excision      "nose and face"  . Abdominal hysterectomy    . Hemorrhoid surgery  11/2008    banding   I have reviewed the [facility] and [facility] and  If appropriate  update it with new information. Allergies  Allergen Reactions  . Cortisone Other (See Comments)    Rash   . Minocin [Minocycline Hcl] Other (See Comments)    Unknown  . Minocycline Hcl     unknown  . Neomycin-Bacitracin Zn-Polymyx     unknown  . Neosporin [Neomycin-Bacitracin Zn-Polymyx] Other (See Comments)    Unknown  . Other Other (See Comments)    Steroidal Neuromuscular Blockers  . Tetanus Toxoids Other (See Comments)    Unknown   Scheduled Meds: . amLODipine  10 mg Oral Daily  . donepezil  10 mg Oral QHS  . escitalopram  20 mg Oral q morning - 10a  . gabapentin  200 mg Oral QHS  . ipratropium-albuterol  3 mL Nebulization TID  . levothyroxine  88 mcg Oral QAC breakfast  . loratadine  10 mg Oral Daily  . mupirocin ointment  1 application Nasal Daily  . olopatadine  1 drop Both Eyes BID  . piperacillin-tazobactam (ZOSYN)  IV  3.375 g Intravenous Q8H  . polyethylene glycol  17 g Oral q morning - 10a  . pravastatin  40 mg Oral QPM  . senna-docusate  1 tablet Oral Daily  . senna-docusate  2 tablet Oral QHS  . sodium chloride  3 mL Intravenous Q12H  . [START ON 01/29/2014] vancomycin  1,000 mg Intravenous Q24H   Continuous Infusions:  PRN Meds:.sodium chloride, acetaminophen **OR** acetaminophen, albuterol, benzonatate, chlorpheniramine-HYDROcodone, hydrALAZINE, HYDROcodone-acetaminophen, morphine injection, ondansetron **OR** ondansetron (ZOFRAN) IV, polyvinyl alcohol, pramoxine-mineral oil-zinc, sodium chloride, zolpidem    BP 139/48 mmHg  Pulse  60  Temp(Src) 97.8 F (36.6 C) (Oral)  Resp 18  Ht 5\' 4"  (1.626 m)  Wt 66.679 kg (147 lb)  BMI 25.22 kg/m2  SpO2 97%   PPS: 30%   Intake/Output Summary (Last 24 hours) at 01/28/14 1321 Last data filed at 01/27/14 1851  Gross per 24 hour  Intake    480 ml  Output      0 ml  Net    480 ml   LBM: 01/28/14  Physical Exam:  General: NAD, pleasant, appears well for age HEENT:  [place]/AT, no JVD, moist mucous membranes Chest: No labored breathing, symmetric CVS: RRR Abdomen: Soft, NT, ND Ext: MAE, no edema, warm to touch Neuro: Awake, alert, oriented x 3, follows commands  Labs: CBC    Component Value Date/Time   WBC 7.5 01/28/2014 0335   WBC 6.6 10/22/2013   RBC 3.58* 01/28/2014 0335   HGB 10.2* 01/28/2014 0335   HCT 33.0* 01/28/2014 0335   PLT 220 01/28/2014 0335   MCV 92.2 01/28/2014 0335   MCH 28.5 01/28/2014 0335   MCHC 30.9 01/28/2014 0335   RDW 22.5* 01/28/2014 0335   LYMPHSABS 1.4 01/25/2014 1502   MONOABS 0.6 01/25/2014 1502   EOSABS 0.5 01/25/2014 1502   BASOSABS 0.0 01/25/2014 1502    BMET    Component Value Date/Time   NA 139 01/28/2014 0335   NA 140 06/16/2013   K 5.3 01/28/2014 0335   CL 105 01/28/2014 0335   CO2 21 01/28/2014 0335   GLUCOSE 100* 01/28/2014 0335   GLUCOSE 92 02/05/2006 1419   BUN 21 01/28/2014 0335   BUN 24* 06/16/2013   CREATININE 1.26* 01/28/2014 0335   CREATININE 1.81* 11/19/2013 1125   CREATININE 1.4* 06/16/2013   CALCIUM 9.6 01/28/2014 0335   GFRNONAA 36* 01/28/2014 0335   GFRNONAA 24* 11/19/2013 1125   GFRAA 42* 01/28/2014 0335   GFRAA 28* 11/19/2013 1125  CMP     Component Value Date/Time   NA 139 01/28/2014 0335   NA 140 06/16/2013   K 5.3 01/28/2014 0335   CL 105 01/28/2014 0335   CO2 21 01/28/2014 0335   GLUCOSE 100* 01/28/2014 0335   GLUCOSE 92 02/05/2006 1419   BUN 21 01/28/2014 0335   BUN 24* 06/16/2013   CREATININE 1.26* 01/28/2014 0335   CREATININE 1.81* 11/19/2013 1125   CREATININE 1.4*  06/16/2013   CALCIUM 9.6 01/28/2014 0335   PROT 7.5 11/04/2013 1548   ALBUMIN 3.2* 11/04/2013 1548   AST 25 11/04/2013 1548   ALT 6 11/04/2013 1548   ALKPHOS 89 11/04/2013 1548   BILITOT 0.3 11/04/2013 1548   GFRNONAA 36* 01/28/2014 0335   GFRNONAA 24* 11/19/2013 1125   GFRAA 42* 01/28/2014 0335   GFRAA 28* 11/19/2013 1125     Time In Time Out Total Time Spent with Patient Total Overall Time  1220 1340 53mn 83m    Greater than 50%  of this time was spent counseling and coordinating care related to the above assessment and plan.  AlVinie SillNP Palliative Medicine Team Pager # [phone]-F 8a-5p) Team Phone # [phone]/Weekends)

## 2014-01-28 NOTE — Progress Notes (Signed)
PATIENT DETAILS Name: [name] Age: 80 y.o. Sex: female Date of Birth: 05-06-1919 Admit Date: 01/25/2014 Admitting Physician Merton Border, MD WVP:[id number],RSWNIO D, MD  Subjective: Continues to have hemoptysis-but seems to be improving  Assessment/Plan: Active Problems:  Hemoptysis:secondary to postobstructive pneumonia. Continue with Abx.Hemoptysis improving. PCCM following.   Post Obstructive EVO:[id number] above. Hemoptysis improving, afebrile. IV Abx as below.   Known RLL Lung Mass:likely malignancy (ex smoker). After d/w patient, she does not want treatment in form of chemo/RTx or surgery. No point in pursuing Bronchoscopy or Bx. Palliative eval ordered.   Hypothyroidism:on Levothyroxine, continue.   COPD:stable, continue Nebs and spiriva   Chronic Resp Failure:continue O2    HWE:XHBZJ cough-hold of on re-starting ACEI.Increase Amlodipine to 10 mg daily   Severe Aortic Stenosis:not a surgical candidate. Outpatient monitoring.   Hx of IRC:VELFYBOFBP on coumadin, discontinued by Dr Stanford Breed on Aug 2015. On ASA-but on hold due to hemoptysis.   Disposition: Remain inpatient  Antibiotics:  IV Vancomycin 11/2>>  IV Cefepime 11/2>>11/4  IV Zosyn 11/4>>  DVT Prophylaxis: SCD's  Code Status: DNR  Family Communication None at bedside  Procedures:  None  CONSULTS:  pulmonary/intensive care   MEDICATIONS: Scheduled Meds: . amLODipine  10 mg Oral Daily  . donepezil  10 mg Oral QHS  . escitalopram  20 mg Oral q morning - 10a  . gabapentin  200 mg Oral QHS  . ipratropium-albuterol  3 mL Nebulization TID  . levothyroxine  88 mcg Oral QAC breakfast  . loratadine  10 mg Oral Daily  . mupirocin ointment  1 application Nasal Daily  . olopatadine  1 drop Both Eyes BID  . piperacillin-tazobactam (ZOSYN)  IV  3.375 g Intravenous Q8H  . polyethylene glycol  17 g Oral q morning - 10a  . potassium chloride SA  40 mEq Oral Daily  . pravastatin  40 mg  Oral QPM  . senna-docusate  1 tablet Oral Daily  . senna-docusate  2 tablet Oral QHS  . sodium chloride  3 mL Intravenous Q12H  . [START ON 01/29/2014] vancomycin  1,000 mg Intravenous Q24H   Continuous Infusions:   PRN Meds:.sodium chloride, acetaminophen **OR** acetaminophen, albuterol, benzonatate, chlorpheniramine-HYDROcodone, hydrALAZINE, HYDROcodone-acetaminophen, morphine injection, ondansetron **OR** ondansetron (ZOFRAN) IV, polyvinyl alcohol, pramoxine-mineral oil-zinc, sodium chloride, zolpidem  Antibiotics: Anti-infectives    Start     Dose/Rate Route Frequency Ordered Stop   01/29/14 0600  vancomycin (VANCOCIN) IVPB 1000 mg/200 mL premix     1,000 mg200 mL/hr over 60 Minutes Intravenous Every 24 hours 01/28/14 1106 02/02/14 2359   01/28/14 1400  piperacillin-tazobactam (ZOSYN) IVPB 3.375 g     3.375 g12.5 mL/hr over 240 Minutes Intravenous Every 8 hours 01/28/14 1106     01/28/14 0600  vancomycin (VANCOCIN) IVPB 1000 mg/200 mL premix  Status:  Discontinued     1,000 mg200 mL/hr over 60 Minutes Intravenous Every 48 hours 01/25/14 2047 01/28/14 1106   01/27/14 1000  piperacillin-tazobactam (ZOSYN) IVPB 2.25 g  Status:  Discontinued     2.25 g100 mL/hr over 30 Minutes Intravenous 3 times per day 01/27/14 0856 01/28/14 1105   01/26/14 2000  ceFEPIme (MAXIPIME) 1 g in dextrose 5 % 50 mL IVPB  Status:  Discontinued     1 g100 mL/hr over 30 Minutes Intravenous Every 24 hours 01/25/14 2046 01/25/14 2136   01/25/14 2230  ceFEPIme (MAXIPIME) 1 g in dextrose 5 % 50 mL IVPB  Status:  Discontinued  1 g100 mL/hr over 30 Minutes Intravenous Every 24 hours 01/25/14 2136 01/27/14 0842   01/25/14 2200  ceFEPIme (MAXIPIME) 1 g in dextrose 5 % 50 mL IVPB  Status:  Discontinued     1 g100 mL/hr over 30 Minutes Intravenous 3 times per day 01/25/14 2106 01/25/14 2131   01/25/14 2000  vancomycin (VANCOCIN) IVPB 1000 mg/200 mL premix     1,000 mg200 mL/hr over 60 Minutes Intravenous  Once 01/25/14  1953 01/26/14 0529   01/25/14 2000  piperacillin-tazobactam (ZOSYN) IVPB 3.375 g     3.375 g100 mL/hr over 30 Minutes Intravenous  Once 01/25/14 1953 01/25/14 2109       PHYSICAL EXAM: Vital signs in last 24 hours: Filed Vitals:   01/27/14 2026 01/27/14 2138 01/28/14 0539 01/28/14 1010  BP:  151/81 151/80 139/48  Pulse:  88 83 60  Temp:  98.2 F (36.8 C) 97.3 F (36.3 C) 97.8 F (36.6 C)  TempSrc:  Oral Oral Oral  Resp:  18 18   Height:      Weight:      SpO2: 94% 94% 91% 97%    Weight change:  Filed Weights   01/25/14 1930 01/25/14 2107  Weight: 65.318 kg (144 lb) 66.679 kg (147 lb)   Body mass index is 25.22 kg/(m^2).   Gen Exam: Awake and alert with clear speech.  Neck: Supple, No JVD.   Chest: Bibasilar rales CVS: S1 S2 Regular, +3/6 syst murmur Abdomen: soft, BS +, non tender, non distended.  Extremities: no edema, lower extremities warm to touch. Neurologic: Non Focal.   Skin: No Rash.   Wounds: N/A.   Intake/Output from previous day:  Intake/Output Summary (Last 24 hours) at 01/28/14 1220 Last data filed at 01/27/14 1851  Gross per 24 hour  Intake    480 ml  Output      0 ml  Net    480 ml     LAB RESULTS: CBC  Recent Labs Lab 01/25/14 1502 01/26/14 0531 01/28/14 0335  WBC 7.7 7.6 7.5  HGB 11.6* 10.3* 10.2*  HCT 37.0 32.8* 33.0*  PLT 258 212 220  MCV 89.4 89.4 92.2  MCH 28.0 28.1 28.5  MCHC 31.4 31.4 30.9  RDW 23.4* 23.8* 22.5*  LYMPHSABS 1.4  --   --   MONOABS 0.6  --   --   EOSABS 0.5  --   --   BASOSABS 0.0  --   --     Chemistries   Recent Labs Lab 01/25/14 1502 01/26/14 0531 01/28/14 0335  NA 141 143 139  K 4.5 4.6 5.3  CL 105 110 105  CO2 21 19 21   GLUCOSE 108* 121* 100*  BUN 27* 31* 21  CREATININE 2.09* 1.87* 1.26*  CALCIUM 9.2 9.2 9.6    CBG: No results for input(s): GLUCAP in the last 168 hours.  GFR Estimated Creatinine Clearance: 26.8 mL/min (by C-G formula based on Cr of 1.26).  Coagulation profile No  results for input(s): INR, PROTIME in the last 168 hours.  Cardiac Enzymes No results for input(s): CKMB, TROPONINI, MYOGLOBIN in the last 168 hours.  Invalid input(s): CK  Invalid input(s): POCBNP No results for input(s): DDIMER in the last 72 hours. No results for input(s): HGBA1C in the last 72 hours. No results for input(s): CHOL, HDL, LDLCALC, TRIG, CHOLHDL, LDLDIRECT in the last 72 hours. No results for input(s): TSH, T4TOTAL, T3FREE, THYROIDAB in the last 72 hours.  Invalid input(s): FREET3 No results for  input(s): VITAMINB12, FOLATE, FERRITIN, TIBC, IRON, RETICCTPCT in the last 72 hours. No results for input(s): LIPASE, AMYLASE in the last 72 hours.  Urine Studies No results for input(s): UHGB, CRYS in the last 72 hours.  Invalid input(s): UACOL, UAPR, USPG, UPH, UTP, UGL, UKET, UBIL, UNIT, UROB, ULEU, UEPI, UWBC, URBC, UBAC, CAST, UCOM, BILUA  MICROBIOLOGY: Recent Results (from the past 240 hour(s))  Culture, blood (routine x 2) Call MD if unable to obtain prior to antibiotics being given     Status: None (Preliminary result)   Collection Time: 01/25/14 11:55 PM  Result Value Ref Range Status   Specimen Description BLOOD RIGHT HAND  Final   Special Requests BOTTLES DRAWN AEROBIC ONLY 5CC  Final   Culture  Setup Time   Final    01/26/2014 08:59 Performed at [facility]    Culture   Final           BLOOD CULTURE RECEIVED NO GROWTH TO DATE CULTURE WILL BE HELD FOR 5 DAYS BEFORE ISSUING A FINAL NEGATIVE REPORT Performed at [facility]    Report Status PENDING  Incomplete  Culture, blood (routine x 2) Call MD if unable to obtain prior to antibiotics being given     Status: None (Preliminary result)   Collection Time: 01/26/14 12:01 AM  Result Value Ref Range Status   Specimen Description BLOOD LEFT HAND  Final   Special Requests BOTTLES DRAWN AEROBIC AND ANAEROBIC [facility] EACH  Final   Culture  Setup Time   Final    01/26/2014 08:59 Performed at [facility]    Culture   Final           BLOOD CULTURE RECEIVED NO GROWTH TO DATE CULTURE WILL BE HELD FOR 5 DAYS BEFORE ISSUING A FINAL NEGATIVE REPORT Performed at [facility]    Report Status PENDING  Incomplete  Culture, sputum-assessment     Status: None   Collection Time: 01/26/14  2:43 AM  Result Value Ref Range Status   Specimen Description SPUTUM  Final   Special Requests NONE  Final   Sputum evaluation   Final    MICROSCOPIC FINDINGS SUGGEST THAT THIS SPECIMEN IS NOT REPRESENTATIVE OF LOWER RESPIRATORY SECRETIONS. PLEASE RECOLLECT. RESULT CALLED TO, READ BACK BY AND VERIFIED WITH: NOTIFIED Deeann Dowse RN [phone] GREEN R    Report Status 01/26/2014 FINAL  Final  Culture, expectorated sputum-assessment     Status: None   Collection Time: 01/26/14 10:23 PM  Result Value Ref Range Status   Specimen Description SPUTUM  Final   Special Requests Immunocompromised  Final   Sputum evaluation   Final    THIS SPECIMEN IS ACCEPTABLE. RESPIRATORY CULTURE REPORT TO FOLLOW.   Report Status 01/26/2014 FINAL  Final  Culture, respiratory (NON-Expectorated)     Status: None (Preliminary result)   Collection Time: 01/26/14 10:23 PM  Result Value Ref Range Status   Specimen Description SPUTUM  Final   Special Requests NONE  Final   Gram Stain   Final    FEW WBC PRESENT, PREDOMINANTLY PMN FEW SQUAMOUS EPITHELIAL CELLS PRESENT FEW GRAM POSITIVE COCCI IN PAIRS Performed at [facility]    Culture   Final    NORMAL OROPHARYNGEAL FLORA Performed at [facility]    Report Status PENDING  Incomplete    RADIOLOGY STUDIES/RESULTS: Dg Chest 2 View  01/27/2014   CLINICAL DATA:  Hemoptysis  EXAM: CHEST  2 VIEW  COMPARISON:  01/25/2014  FINDINGS: Progression of right lower lobe airspace disease which may represent pneumonia or pulmonary hemorrhage. No significant pleural effusion. Left lung is clear.  COPD with scarring  Cardiac enlargement.  Hiatal hernia.   Negative for heart failure.  IMPRESSION: Progression of right lower lobe airspace disease which may represent hemorrhage or pneumonia.  Hiatal hernia   Electronically Signed   By: Franchot Gallo M.D.   On: 01/27/2014 13:07   Dg Chest 2 View (if Patient Has Fever And/or Copd)  01/25/2014   CLINICAL DATA:  80 year old female with newly diagnosed lung mass and possible pneumonia, with hemoptysis.  EXAM: CHEST  2 VIEW  COMPARISON:  Chest x-ray 01/15/2014.  Chest CT 01/22/2014.  FINDINGS: Again noted is a mass like opacity in the posterior aspect of the right lower lobe, better demonstrated on recent chest CT. There are ill-defined opacities throughout the right lower lobe, likely to reflect some airspace consolidation, particularly from hemorrhage given the patient's history of hemoptysis. Alternatively, some of this could reflect some postobstructive pneumonitis or pneumonia. Left lung appears clear. No definite pleural effusions. Linear opacities in the upper lobes of the lungs bilaterally are similar to prior examinations, compatible with areas of chronic scarring. Heart size is mildly enlarged. Moderate to large hiatal hernia again noted. Atherosclerosis in the thoracic aorta.  IMPRESSION: 1. Overall, the chest x-ray appears very similar to prior study 01/15/2014, with right lower lobe mass and airspace disease in the right lower lobe, which is favored to reflect some hemorrhage given the patient's history of hemoptysis, but may alternatively reflect some postobstructive changes.   Electronically Signed   By: Vinnie Langton M.D.   On: 01/25/2014 17:46   Dg Chest 2 View  01/15/2014   CLINICAL DATA:  Hemoptysis with COPD in shortness of breath, subsequent encounter.  EXAM: CHEST  2 VIEW  COMPARISON:  01/29/2013.  FINDINGS: Lungs are hyperexpanded with scattered areas of upper and lower lung scarring. Patchy opacity overlying the lower spine on the lateral film is probably related airspace disease although  this is not easily localized on the frontal projection which appears very similar to the earlier study. The cardio pericardial silhouette is enlarged. Hiatal hernia again noted. Bones are diffusely demineralized.  IMPRESSION: Emphysema with patchy opacity over the posterior lung bases on the lateral film. This is probably airspace disease and pneumonia would be a consideration. Less likely, there could be a sclerotic lesion in the T10 vertebral body. Repeat two view chest x-ray after treatment could be used to ensure resolution. Alternatively a CT scan of the chest could be used to further evaluate as clinically warranted.   Electronically Signed   By: Misty Stanley M.D.   On: 01/15/2014 15:46   Ct Chest Wo Contrast  01/22/2014   CLINICAL DATA:  Aspiration pneumonia. Hemoptysis, shortness of breath, and 20 lb weight loss.  BUN and creatinine were obtained on site at [facility] at  [address]  Results:  BUN 25 mg/dL,  Creatinine 1.84 mg/dL.  EXAM: CT CHEST WITHOUT CONTRAST  TECHNIQUE: Multidetector CT imaging of the chest was performed following the standard protocol without IV contrast.  COMPARISON:  Chest radiographs 01/15/2014 and CT 10/08/2011  FINDINGS: No enlarged axillary, mediastinal, or hilar lymph nodes are identified. Extensive, diffuse atherosclerotic calcification is noted of the visualized aorta and coronary arteries. There is trace pericardial fluid. Heart is again noted to be mildly enlarged. Aortic valve and mitral annular calcifications are also noted. There is a moderately  large hiatal hernia.  There is no pleural effusion. Centrilobular emphysema is again seen. Peripheral curvilinear opacities in the peripheral aspects of the upper lobes are similar to the prior study and likely represent scarring. 12 mm sub solid nodule in the right middle lobe has minimally increased in size (series 3, image 40, previously 10 mm).  There is new ground-glass opacity throughout the medial  left lower lobe. There is progressive architectural distortion in the posterior right lower lobe with cystic changes again seen. Consolidative masslike opacity adjacent to this area of bullous change has increased from the prior CT and measures approximately 3.3 x 2.2 cm (series 3, image 34). Additional patchy areas of consolidative opacity are present more inferiorly in the right lower lobe. There is progressive interlobular septal thickening in the basilar right lower lobe. 5 mm ground-glass left upper lobe nodule is unchanged (series 3, image 15). 2 mm left upper lobe subpleural nodule is unchanged (series 3, image 23).  Small calcifications in the right renal hilum may reflect a combination of vascular calcification and nonobstructing collecting system stones. Thoracolumbar spondylosis is noted.  IMPRESSION: 1. Progressive changes in the right lower lobe as above with a new masslike opacity measuring 3.3 x 2.2 cm. Given history, this is concerning for primary bronchogenic carcinoma, with additional ground-glass and consolidative opacities elsewhere in the right lower lobe possibly representing hemorrhage. Pneumonia superimposed on chronic aspiration might be an additional consideration, however further evaluation with PET-CT is recommended given concern for malignancy. 2. Slightly increased size of 12 mm nodule in the right middle lobe, which may reflect slow growing primary lung adenocarcinoma.  These results will be called to the ordering clinician or representative by the Radiologist Assistant, and communication documented in the PACS or zVision Dashboard.   Electronically Signed   By: Logan Bores   On: 01/22/2014 16:17   Dg Chest Port 1 View  01/28/2014   CLINICAL DATA:  Followup pneumonia.  Chest congestion.  EXAM: PORTABLE CHEST - 1 VIEW  COMPARISON:  01/27/2014  FINDINGS: Further mild progression of airspace disease is seen in the right lower lobe. Left upper lobe scarring is stable. Heart size also  stable. Moderate size hiatal hernia again noted.  IMPRESSION: Mild increase in right lower lobe airspace disease.  Moderate hiatal hernia.   Electronically Signed   By: Earle Gell M.D.   On: 01/28/2014 08:10    Oren Binet, MD  [facility] Pager:[phone]  If 7PM-7AM, please contact night-coverage [email or web address] Password [email or web address] 01/28/2014, 12:20 PM   LOS: 3 days

## 2014-01-28 NOTE — Progress Notes (Signed)
ANTIBIOTIC CONSULT NOTE - FOLLOW UP  Pharmacy Consult:  Vancomycin / Zosyn Indication:  Post-obstructive PNA  Allergies  Allergen Reactions  . Cortisone Other (See Comments)    Rash   . Minocin [Minocycline Hcl] Other (See Comments)    Unknown  . Minocycline Hcl     unknown  . Neomycin-Bacitracin Zn-Polymyx     unknown  . Neosporin [Neomycin-Bacitracin Zn-Polymyx] Other (See Comments)    Unknown  . Other Other (See Comments)    Steroidal Neuromuscular Blockers  . Tetanus Toxoids Other (See Comments)    Unknown    Patient Measurements: Height: 5\' 4"  (162.6 cm) Weight: 147 lb (66.679 kg) IBW/kg (Calculated) : 54.7  Vital Signs: Temp: 97.8 F (36.6 C) (11/05 1010) Temp Source: Oral (11/05 1010) BP: 139/48 mmHg (11/05 1010) Pulse Rate: 60 (11/05 1010) Intake/Output from previous day: 11/04 0701 - 11/05 0700 In: 720 [P.O.:720] Out: -   Labs:  Recent Labs  01/25/14 1502 01/26/14 0531 01/28/14 0335  WBC 7.7 7.6 7.5  HGB 11.6* 10.3* 10.2*  PLT 258 212 220  CREATININE 2.09* 1.87* 1.26*   Estimated Creatinine Clearance: 26.8 mL/min (by C-G formula based on Cr of 1.26). No results for input(s): VANCOTROUGH, VANCOPEAK, VANCORANDOM, GENTTROUGH, GENTPEAK, GENTRANDOM, TOBRATROUGH, TOBRAPEAK, TOBRARND, AMIKACINPEAK, AMIKACINTROU, AMIKACIN in the last 72 hours.   Microbiology: Recent Results (from the past 720 hour(s))  Culture, blood (routine x 2) Call MD if unable to obtain prior to antibiotics being given     Status: None (Preliminary result)   Collection Time: 01/25/14 11:55 PM  Result Value Ref Range Status   Specimen Description BLOOD RIGHT HAND  Final   Special Requests BOTTLES DRAWN AEROBIC ONLY 5CC  Final   Culture  Setup Time   Final    01/26/2014 08:59 Performed at [facility]    Culture   Final           BLOOD CULTURE RECEIVED NO GROWTH TO DATE CULTURE WILL BE HELD FOR 5 DAYS BEFORE ISSUING A FINAL NEGATIVE REPORT Performed at [facility]    Report Status PENDING  Incomplete  Culture, blood (routine x 2) Call MD if unable to obtain prior to antibiotics being given     Status: None (Preliminary result)   Collection Time: 01/26/14 12:01 AM  Result Value Ref Range Status   Specimen Description BLOOD LEFT HAND  Final   Special Requests BOTTLES DRAWN AEROBIC AND ANAEROBIC [facility] EACH  Final   Culture  Setup Time   Final    01/26/2014 08:59 Performed at [facility]    Culture   Final           BLOOD CULTURE RECEIVED NO GROWTH TO DATE CULTURE WILL BE HELD FOR 5 DAYS BEFORE ISSUING A FINAL NEGATIVE REPORT Performed at [facility]    Report Status PENDING  Incomplete  Culture, sputum-assessment     Status: None   Collection Time: 01/26/14  2:43 AM  Result Value Ref Range Status   Specimen Description SPUTUM  Final   Special Requests NONE  Final   Sputum evaluation   Final    MICROSCOPIC FINDINGS SUGGEST THAT THIS SPECIMEN IS NOT REPRESENTATIVE OF LOWER RESPIRATORY SECRETIONS. PLEASE RECOLLECT. RESULT CALLED TO, READ BACK BY AND VERIFIED WITH: NOTIFIED Deeann Dowse RN [phone] GREEN R    Report Status 01/26/2014 FINAL  Final  Culture, expectorated sputum-assessment     Status: None   Collection Time: 01/26/14 10:23 PM  Result Value  Ref Range Status   Specimen Description SPUTUM  Final   Special Requests Immunocompromised  Final   Sputum evaluation   Final    THIS SPECIMEN IS ACCEPTABLE. RESPIRATORY CULTURE REPORT TO FOLLOW.   Report Status 01/26/2014 FINAL  Final  Culture, respiratory (NON-Expectorated)     Status: None (Preliminary result)   Collection Time: 01/26/14 10:23 PM  Result Value Ref Range Status   Specimen Description SPUTUM  Final   Special Requests NONE  Final   Gram Stain   Final    FEW WBC PRESENT, PREDOMINANTLY PMN FEW SQUAMOUS EPITHELIAL CELLS PRESENT FEW GRAM POSITIVE COCCI IN PAIRS Performed at [facility]    Culture   Final    NORMAL OROPHARYNGEAL  FLORA Performed at [facility]    Report Status PENDING  Incomplete      Assessment: 89 YOF with pos-obstructive PNA to continue vancomycin and Zosyn.  Patient's renal function is improving.  Vanc 11/3 >> Cefepime 11/2 >> 11/4 Zosyn 11/4 >> LVQ PTA  11/2 BCx x2 - NGTD 11/3 resp cx - negative   Goal of Therapy:  Vancomycin trough level 15-20 mcg/ml   Plan:  - Change Vanc 1gm IV Q24H x8 days total (stop date 02/02/14) - Change Zosyn to 3.375gm IV Q8H, 4 hr infusion - Monitor renal fxn, clinical progress, vanc trough if indicated - Recommend holding KCL    Luqman Perrelli D. Mina Marble, PharmD, BCPS Pager:  [phone] 01/28/2014, 11:04 AM

## 2014-01-28 NOTE — Plan of Care (Signed)
Problem: Phase II Progression Outcomes Goal: Encourage coughing & deep breathing Outcome: Progressing

## 2014-01-28 NOTE — Progress Notes (Signed)
Name: [name] MRN: [id number] DOB: [birth year]    ADMISSION DATE:  01/25/2014 CONSULTATION DATE:  01/26/14  REFERRING MD :  Dr. Sloan Leiter   CHIEF COMPLAINT:  Hemoptysis   BRIEF PATIENT DESCRIPTION: 80 y/o F with known RLL lung mass admitted with small amount hemoptysis.  PCCM consulted for evaluation.    SIGNIFICANT EVENTS  11/02  Admit with hemoptysis   STUDIES:  04/09/07 PFT >> FEV1 1.75(107%), FEV1% 58, TLC 5.21(111%), DLCO 41%  03/23/13 ECHO >> mod LVH, EF 55 to 60%, mod AS, mild AR, mod MR, severe LA dilation, mild RA dilation, mod/sev TR, mod PR, PAS 71  10/30  CT Chest >> progressive RLL mass-like opacity, slight increase in RML nodule (69mm)  SUBJECTIVE:  Cough up small amount of blood this AM.  Otherwise feels breathing is better.  Feels nebs help.  VITAL SIGNS: Temp:  [97.3 F (36.3 C)-98.2 F (36.8 C)] 97.8 F (36.6 C) (11/05 1010) Pulse Rate:  [60-88] 60 (11/05 1010) Resp:  [18] 18 (11/05 0539) BP: (134-151)/(48-81) 139/48 mmHg (11/05 1010) SpO2:  [91 %-97 %] 97 % (11/05 1010)  PHYSICAL EXAMINATION: General: pleasant Neuro: normal strength HEENT: no sinus tenderness Cardiovascular:  s1s2 rrr, SEM Lungs: no wheeze/rales Abdomen: soft, non tender Musculoskeletal: no edema Skin: no rashes   Recent Labs Lab 01/25/14 1502 01/26/14 0531 01/28/14 0335  NA 141 143 139  K 4.5 4.6 5.3  CL 105 110 105  CO2 21 19 21   BUN 27* 31* 21  CREATININE 2.09* 1.87* 1.26*  GLUCOSE 108* 121* 100*    Recent Labs Lab 01/25/14 1502 01/26/14 0531 01/28/14 0335  HGB 11.6* 10.3* 10.2*  HCT 37.0 32.8* 33.0*  WBC 7.7 7.6 7.5  PLT 258 212 220   Dg Chest 2 View  01/27/2014   CLINICAL DATA:  Hemoptysis  EXAM: CHEST  2 VIEW  COMPARISON:  01/25/2014  FINDINGS: Progression of right lower lobe airspace disease which may represent pneumonia or pulmonary hemorrhage. No significant pleural effusion. Left lung is clear.  COPD with scarring  Cardiac enlargement.  Hiatal  hernia.  Negative for heart failure.  IMPRESSION: Progression of right lower lobe airspace disease which may represent hemorrhage or pneumonia.  Hiatal hernia   Electronically Signed   By: Franchot Gallo M.D.   On: 01/27/2014 13:07   Dg Chest Port 1 View  01/28/2014   CLINICAL DATA:  Followup pneumonia.  Chest congestion.  EXAM: PORTABLE CHEST - 1 VIEW  COMPARISON:  01/27/2014  FINDINGS: Further mild progression of airspace disease is seen in the right lower lobe. Left upper lobe scarring is stable. Heart size also stable. Moderate size hiatal hernia again noted.  IMPRESSION: Mild increase in right lower lobe airspace disease.  Moderate hiatal hernia.   Electronically Signed   By: Earle Gell M.D.   On: 01/28/2014 08:10    ASSESSMENT / PLAN:  Hemoptysis 2nd to HCAP and RLL mass (likely malignancy) >> perhaps slowly improving. Plan: Continue Abx per primary team If hemoptysis persists, then she might need to have intervention >> otherwise pt would not want evaluation/therapy for RLL mass  Hx of COPD/emphysema. Plan: Continue duoneb  Back pain. Plan: Per primary team  Updated pt's daughter at bedside.  Summary: She still has some episodes of hemoptysis, but seems less in amount.  No significant progression on CXR.  Would continue IV abx in hospital today >> if improved, then she might be ready for d/c in next 24 to 48 hrs.  Chesley Mires, MD [facility] Pulmonary/Critical Care 01/28/2014, 11:48 AM Pager:  [phone] After 3pm call: [phone]

## 2014-01-28 NOTE — Clinical Social Work Psychosocial (Signed)
Clinical Social Work Department BRIEF PSYCHOSOCIAL ASSESSMENT 01/28/2014  Patient:  [name], [name]     Account Number:  [id number]     Admit date:  01/25/2014  Clinical Social Worker:  Domenica Reamer, Sour John  Date/Time:  01/27/2014 10:00 AM  Referred by:  Physician  Date Referred:  01/27/2014 Referred for  ALF Placement   Other Referral:   Interview type:  Patient Other interview type:    PSYCHOSOCIAL DATA Living Status:  FACILITY Admitted from facility:  [facility] Level of care:  Assisted Living Primary support name:  [name] Primary support relationship to patient:  CHILD, ADULT Degree of support available:   Patient reports high level of support from children    CURRENT CONCERNS Current Concerns  Post-Acute Placement   Other Concerns:    SOCIAL WORK ASSESSMENT / PLAN Patient is from [facility] and wishes to return there upon discharge.  Patient stated that she has lots of support at her ALF and looks forward to returning.  CSW will continue to follow.   Assessment/plan status:  Psychosocial Support/Ongoing Assessment of Needs Other assessment/ plan:   FL2   Information/referral to community resources:    PATIENT'S/FAMILY'S RESPONSE TO PLAN OF CARE: Patient is agreeable to returning to [facility] when she is medically stable.       Domenica Reamer, [place] Social Worker [phone]

## 2014-01-29 DIAGNOSIS — J42 Unspecified chronic bronchitis: Secondary | ICD-10-CM

## 2014-01-29 LAB — BASIC METABOLIC PANEL
Anion gap: 13 (ref 5–15)
BUN: 18 mg/dL (ref 6–23)
CO2: 21 mEq/L (ref 19–32)
Calcium: 9.7 mg/dL (ref 8.4–10.5)
Chloride: 106 mEq/L (ref 96–112)
Creatinine, Ser: 1.36 mg/dL — ABNORMAL HIGH (ref 0.50–1.10)
GFR calc Af Amer: 38 mL/min — ABNORMAL LOW (ref >90)
GFR calc non Af Amer: 33 mL/min — ABNORMAL LOW (ref >90)
Glucose, Bld: 105 mg/dL — ABNORMAL HIGH (ref 70–99)
Potassium: 5.2 mEq/L (ref 3.7–5.3)
Sodium: 140 mEq/L (ref 137–147)

## 2014-01-29 LAB — CULTURE, RESPIRATORY

## 2014-01-29 LAB — CULTURE, RESPIRATORY W GRAM STAIN: Culture: NORMAL

## 2014-01-29 MED ORDER — LORAZEPAM 2 MG/ML PO CONC: 1.0000 mg | Freq: Four times a day (QID) | ORAL | Status: AC | PRN

## 2014-01-29 MED ORDER — MORPHINE SULFATE (CONCENTRATE) 10 MG /0.5 ML PO SOLN: 5.0000 mg | ORAL | Status: AC | PRN

## 2014-01-29 MED ORDER — LEVOFLOXACIN 500 MG PO TABS: 500.0000 mg | ORAL_TABLET | Freq: Every day | ORAL | Status: DC

## 2014-01-29 MED ORDER — HYDROCOD POLST-CHLORPHEN POLST 10-8 MG/5ML PO LQCR: 5.0000 mL | Freq: Two times a day (BID) | ORAL | Status: AC | PRN

## 2014-01-29 MED ORDER — AMLODIPINE BESYLATE 10 MG PO TABS: 5.0000 mg | ORAL_TABLET | Freq: Every day | ORAL | Status: DC

## 2014-01-29 MED ORDER — ALBUTEROL SULFATE (2.5 MG/3ML) 0.083% IN NEBU: 2.5000 mg | INHALATION_SOLUTION | RESPIRATORY_TRACT | Status: AC | PRN

## 2014-01-29 MED ORDER — MAGIC MOUTHWASH W/LIDOCAINE: 5.0000 mL | Freq: Four times a day (QID) | ORAL | Status: AC | PRN

## 2014-01-29 MED ORDER — LIDOCAINE 5 % EX PTCH: 1.0000 | MEDICATED_PATCH | CUTANEOUS | Status: AC

## 2014-01-29 MED ORDER — BENZONATATE 200 MG PO CAPS: 200.0000 mg | ORAL_CAPSULE | Freq: Three times a day (TID) | ORAL | Status: AC | PRN

## 2014-01-29 NOTE — Progress Notes (Signed)
Discharge patient to [name] , transported by her private duty nurse. Paper work given to patient. Patient is alert and oriented, not in any distress.

## 2014-01-29 NOTE — Discharge Summary (Signed)
PATIENT DETAILS Name: [name] Age: 80 y.o. Sex: female Date of Birth: [birth year] MRN: [id number]. Admitting Physician: Merton Border, MD FOY:DXAJOI,NOMVEH D, MD  Admit Date: 01/25/2014 Discharge date: 01/29/2014  Recommendations for Outpatient Follow-up:  1. Hospice follow-up to optimize comfort care while at assisted living facility 2. Continue with Levaquin for 5 more days from 01/29/14 3. Recheck electrolytes/CBC as needed if desired by patient/PCP 4. Goal of care is for comfort-patient desires no further treatment/workup for hemoptysis  PRIMARY DISCHARGE DIAGNOSIS:  Active Problems:   Postobstructive pneumonia   Hemoptysis   Lung mass   PNA (pneumonia)   COLD (chronic obstructive lung disease)      PAST MEDICAL HISTORY: Past Medical History  Diagnosis Date  . Cholelithiases   . Hypothyroid   . Leg paresthesia   . Aortic valve stenosis   . Arthritis   . Hyperlipidemia   . Dementia   . Carotid artery occlusion   . Renal disorder   . Hypertension   . COPD (chronic obstructive pulmonary disease)   . SEIZURE DISORDER 11/05/2006  . GERD 11/05/2006  . CVA 02/07/2007  . Macular degeneration of both eyes   . Blind in both eyes   . Peripheral neuropathy   . Pneumonia     "many times"  . Internal hemorrhoid, bleeding   . Malaria     "as a child"  . Skin cancer     "nose, face mostly"  . Chronic lower back pain     "when I go to the bathroom"  . Intestinal infection due to Clostridium difficile 09/2011  . Benign neoplasm of breast   . Gout, unspecified   . Anemia   . Anxiety   . Other dysfunctions of sleep stages or arousal from sleep   . Depression   . Chronic rhinitis   . Abscess of lung(513.0)     RUL  . Diaphragmatic hernia without mention of obstruction or gangrene   . Diverticulosis of colon (without mention of hemorrhage)   . Acute duodenal ulcer with perforation and obstruction 08/2011    perforation  . Hemorrhage of rectum and anus   .  Abscess of intestine 09/2011    drainage  . Other psoriasis   . Lumbago   . Nonspecific elevation of levels of transaminase or lactic acid dehydrogenase (LDH)   . Solitary pulmonary nodule     RLL nodule 1 cm  . Transient ischemic attack (TIA), and cerebral infarction without residual deficits(V12.54)   . Long term (current) use of anticoagulants   . Sacroiliitis 08/27/2012  . Anemia, iron deficiency 10/16/2011  . Congestive heart failure   . Spondylosis of lumbosacral region 09/24/2012    L/S xray 08/2012: Mild to moderate osteoarthritis and degenerative spondylosis he diffusely. Anterior subluxation of L4 relative to L5 at 5.7 cm. Mild to moderate levoconvex curvature the mid lumbar spine    . Dysrhythmia     hx atrial fib  . Arteritis     hx giant cell arteritis  . Aortic stenosis, severe 03/02/2013    Followed since 2004 with mild aortic valve stenosis and mild regurgitation. Echocardiograms 2006-2013 show  progression from mild to moderate aortic stenosis, mild insufficiency. Mild to moderate mitral valve regurgitation evident in 2013.   07/25/11: Worsening valve disease by echocardiogram, pt is not interested in invasive treatment. Symptoms appear stable for now. Continue to monitor       DISCHARGE MEDICATIONS: Current Discharge Medication List    START taking these medications  Details  albuterol (PROVENTIL) (2.5 MG/3ML) 0.083% nebulizer solution Take 3 mLs (2.5 mg total) by nebulization every 2 (two) hours as needed for wheezing. Qty: 75 mL, Refills: 12    Alum & Mag Hydroxide-Simeth (MAGIC MOUTHWASH W/LIDOCAINE) SOLN Take 5 mLs by mouth 4 (four) times daily as needed for mouth pain. Refills: 0    amLODipine (NORVASC) 10 MG tablet Take 0.5 tablets (5 mg total) by mouth daily.    benzonatate (TESSALON) 200 MG capsule Take 1 capsule (200 mg total) by mouth 3 (three) times daily as needed for cough. Qty: 20 capsule, Refills: 0    chlorpheniramine-HYDROcodone (TUSSIONEX) 10-8 MG/5ML  LQCR Take 5 mLs by mouth every 12 (twelve) hours as needed for cough. Qty: 115 mL, Refills: 0    lidocaine (LIDODERM) 5 % Place 1 patch onto the skin daily. Remove & Discard patch within 12 hours or as directed by MD Qty: 30 patch, Refills: 0    LORazepam (ATIVAN) 2 MG/ML concentrated solution Take 0.5 mLs (1 mg total) by mouth every 6 (six) hours as needed for anxiety or sedation. Qty: 30 mL, Refills: 0    Morphine Sulfate (MORPHINE CONCENTRATE) 10 mg / 0.5 ml concentrated solution Take 0.25 mLs (5 mg total) by mouth every 4 (four) hours as needed for severe pain, anxiety or shortness of breath. Qty: 120 mL, Refills: 0      CONTINUE these medications which have CHANGED   Details  levofloxacin (LEVAQUIN) 500 MG tablet Take 1 tablet (500 mg total) by mouth daily. Take for 5 more days from 01/29/14      CONTINUE these medications which have NOT CHANGED   Details  acetaminophen (TYLENOL) 325 MG tablet Take 650 mg by mouth every 6 (six) hours as needed for mild pain.     acyclovir ointment (ZOVIRAX) 5 % Apply 1 application topically every 3 (three) hours as needed. For cold sores    albuterol (PROVENTIL HFA;VENTOLIN HFA) 108 (90 BASE) MCG/ACT inhaler Inhale 2 puffs into the lungs every 6 (six) hours as needed for wheezing or shortness of breath.    augmented betamethasone dipropionate (DIPROLENE-AF) 0.05 % ointment Apply 1 application topically 2 (two) times daily.    betamethasone dipropionate (DIPROLENE) 0.05 % cream Apply 1 application topically 2 (two) times daily.    cetirizine (ZYRTEC) 10 MG tablet Take 10 mg by mouth daily.    chlorhexidine (PERIDEX) 0.12 % solution Use as directed 15 mLs in the mouth or throat. swish 15 mls for 30 sec then spit, twice daily    Clocortolone Pivalate (CLODERM) 0.1 % cream Apply 1 application topically 2 (two) times daily. Applies to lesion on left cheek.    coal tar (NEUTROGENA T-GEL) 0.5 % shampoo Apply 1 application topically See admin  instructions. She uses on Tuesday and Friday.    donepezil (ARICEPT) 10 MG tablet Take 10 mg by mouth at bedtime.     Emollient (CERAVE) CREA Apply 1 application topically daily. Applies to dry skin.    escitalopram (LEXAPRO) 20 MG tablet Take 20 mg by mouth every morning.     furosemide (LASIX) 40 MG tablet Take 1 tablet (40 mg total) by mouth every morning. Qty: 30 tablet, Refills: 0    gabapentin (NEURONTIN) 100 MG capsule Take 200 mg by mouth at bedtime.     hydrOXYzine (ATARAX/VISTARIL) 25 MG tablet Take 25 mg by mouth at bedtime.    levothyroxine (SYNTHROID, LEVOTHROID) 88 MCG tablet Take 88 mcg by mouth daily before breakfast.  Multiple Vitamins-Minerals (PRESERVISION AREDS) CAPS Take 1 capsule by mouth 2 (two) times daily with a meal.     mupirocin ointment (BACTROBAN) 2 % Place 1 application into the nose daily.    olopatadine (PATANOL) 0.1 % ophthalmic solution Place 1 drop into both eyes 2 (two) times daily as needed. For dry eyes    polyethylene glycol (MIRALAX / GLYCOLAX) packet Take 17 g by mouth every morning.     pramoxine-mineral oil-zinc (TUCKS) 1-12.5 % rectal ointment Place 1 application rectally every 2 (two) hours as needed for itching (itching).    pravastatin (PRAVACHOL) 40 MG tablet Take 1 tablet (40 mg total) by mouth every evening. Qty: 90 tablet, Refills: 3    Propylene Glycol (SYSTANE BALANCE) 0.6 % SOLN Place 1 drop into both eyes daily as needed (For dry or itchy eyes.). One drop both eye as needed    senna-docusate (SENOKOT S) 8.6-50 MG per tablet Take 1-2 tablets by mouth See admin instructions. She takes one tablet in the morning and two tablets at bedtime.    tiotropium (SPIRIVA) 18 MCG inhalation capsule Place 18 mcg into inhaler and inhale daily.    amoxicillin (AMOXIL) 500 MG capsule Take 2,000 mg by mouth once. Prior to dental procedure      STOP taking these medications     aspirin EC 81 MG tablet      HYDROcodone-acetaminophen  (NORCO/VICODIN) 5-325 MG per tablet      potassium chloride SA (K-DUR,KLOR-CON) 20 MEQ tablet      enalapril (VASOTEC) 5 MG tablet         ALLERGIES:   Allergies  Allergen Reactions  . Cortisone Other (See Comments)    Rash   . Minocin [Minocycline Hcl] Other (See Comments)    Unknown  . Minocycline Hcl     unknown  . Neomycin-Bacitracin Zn-Polymyx     unknown  . Neosporin [Neomycin-Bacitracin Zn-Polymyx] Other (See Comments)    Unknown  . Other Other (See Comments)    Steroidal Neuromuscular Blockers  . Tetanus Toxoids Other (See Comments)    Unknown    BRIEF HPI:  See H&P, Labs, Consult and Test reports for all details in brief, patient was admitted for evaluation of hemoptysis.  CONSULTATIONS:   pulmonary/intensive care  PERTINENT RADIOLOGIC STUDIES: Dg Chest 2 View  01/27/2014   CLINICAL DATA:  Hemoptysis  EXAM: CHEST  2 VIEW  COMPARISON:  01/25/2014  FINDINGS: Progression of right lower lobe airspace disease which may represent pneumonia or pulmonary hemorrhage. No significant pleural effusion. Left lung is clear.  COPD with scarring  Cardiac enlargement.  Hiatal hernia.  Negative for heart failure.  IMPRESSION: Progression of right lower lobe airspace disease which may represent hemorrhage or pneumonia.  Hiatal hernia   Electronically Signed   By: Franchot Gallo M.D.   On: 01/27/2014 13:07   Dg Chest 2 View (if Patient Has Fever And/or Copd)  01/25/2014   CLINICAL DATA:  80 year old female with newly diagnosed lung mass and possible pneumonia, with hemoptysis.  EXAM: CHEST  2 VIEW  COMPARISON:  Chest x-ray 01/15/2014.  Chest CT 01/22/2014.  FINDINGS: Again noted is a mass like opacity in the posterior aspect of the right lower lobe, better demonstrated on recent chest CT. There are ill-defined opacities throughout the right lower lobe, likely to reflect some airspace consolidation, particularly from hemorrhage given the patient's history of hemoptysis. Alternatively,  some of this could reflect some postobstructive pneumonitis or pneumonia. Left lung appears clear. No  definite pleural effusions. Linear opacities in the upper lobes of the lungs bilaterally are similar to prior examinations, compatible with areas of chronic scarring. Heart size is mildly enlarged. Moderate to large hiatal hernia again noted. Atherosclerosis in the thoracic aorta.  IMPRESSION: 1. Overall, the chest x-ray appears very similar to prior study 01/15/2014, with right lower lobe mass and airspace disease in the right lower lobe, which is favored to reflect some hemorrhage given the patient's history of hemoptysis, but may alternatively reflect some postobstructive changes.   Electronically Signed   By: Vinnie Langton M.D.   On: 01/25/2014 17:46   Dg Chest 2 View  01/15/2014   CLINICAL DATA:  Hemoptysis with COPD in shortness of breath, subsequent encounter.  EXAM: CHEST  2 VIEW  COMPARISON:  01/29/2013.  FINDINGS: Lungs are hyperexpanded with scattered areas of upper and lower lung scarring. Patchy opacity overlying the lower spine on the lateral film is probably related airspace disease although this is not easily localized on the frontal projection which appears very similar to the earlier study. The cardio pericardial silhouette is enlarged. Hiatal hernia again noted. Bones are diffusely demineralized.  IMPRESSION: Emphysema with patchy opacity over the posterior lung bases on the lateral film. This is probably airspace disease and pneumonia would be a consideration. Less likely, there could be a sclerotic lesion in the T10 vertebral body. Repeat two view chest x-ray after treatment could be used to ensure resolution. Alternatively a CT scan of the chest could be used to further evaluate as clinically warranted.   Electronically Signed   By: Misty Stanley M.D.   On: 01/15/2014 15:46   Ct Chest Wo Contrast  01/22/2014   CLINICAL DATA:  Aspiration pneumonia. Hemoptysis, shortness of breath, and  20 lb weight loss.  BUN and creatinine were obtained on site at [place] at  [address]  Results:  BUN 25 mg/dL,  Creatinine 1.84 mg/dL.  EXAM: CT CHEST WITHOUT CONTRAST  TECHNIQUE: Multidetector CT imaging of the chest was performed following the standard protocol without IV contrast.  COMPARISON:  Chest radiographs 01/15/2014 and CT 10/08/2011  FINDINGS: No enlarged axillary, mediastinal, or hilar lymph nodes are identified. Extensive, diffuse atherosclerotic calcification is noted of the visualized aorta and coronary arteries. There is trace pericardial fluid. Heart is again noted to be mildly enlarged. Aortic valve and mitral annular calcifications are also noted. There is a moderately large hiatal hernia.  There is no pleural effusion. Centrilobular emphysema is again seen. Peripheral curvilinear opacities in the peripheral aspects of the upper lobes are similar to the prior study and likely represent scarring. 12 mm sub solid nodule in the right middle lobe has minimally increased in size (series 3, image 40, previously 10 mm).  There is new ground-glass opacity throughout the medial left lower lobe. There is progressive architectural distortion in the posterior right lower lobe with cystic changes again seen. Consolidative masslike opacity adjacent to this area of bullous change has increased from the prior CT and measures approximately 3.3 x 2.2 cm (series 3, image 34). Additional patchy areas of consolidative opacity are present more inferiorly in the right lower lobe. There is progressive interlobular septal thickening in the basilar right lower lobe. 5 mm ground-glass left upper lobe nodule is unchanged (series 3, image 15). 2 mm left upper lobe subpleural nodule is unchanged (series 3, image 23).  Small calcifications in the right renal hilum may reflect a combination of vascular calcification and nonobstructing collecting system  stones. Thoracolumbar spondylosis is noted.   IMPRESSION: 1. Progressive changes in the right lower lobe as above with a new masslike opacity measuring 3.3 x 2.2 cm. Given history, this is concerning for primary bronchogenic carcinoma, with additional ground-glass and consolidative opacities elsewhere in the right lower lobe possibly representing hemorrhage. Pneumonia superimposed on chronic aspiration might be an additional consideration, however further evaluation with PET-CT is recommended given concern for malignancy. 2. Slightly increased size of 12 mm nodule in the right middle lobe, which may reflect slow growing primary lung adenocarcinoma.  These results will be called to the ordering clinician or representative by the Radiologist Assistant, and communication documented in the PACS or zVision Dashboard.   Electronically Signed   By: Logan Bores   On: 01/22/2014 16:17   Dg Chest Port 1 View  01/28/2014   CLINICAL DATA:  Followup pneumonia.  Chest congestion.  EXAM: PORTABLE CHEST - 1 VIEW  COMPARISON:  01/27/2014  FINDINGS: Further mild progression of airspace disease is seen in the right lower lobe. Left upper lobe scarring is stable. Heart size also stable. Moderate size hiatal hernia again noted.  IMPRESSION: Mild increase in right lower lobe airspace disease.  Moderate hiatal hernia.   Electronically Signed   By: Earle Gell M.D.   On: 01/28/2014 08:10     PERTINENT LAB RESULTS: CBC:  Recent Labs  01/28/14 0335  WBC 7.5  HGB 10.2*  HCT 33.0*  PLT 220   CMET CMP     Component Value Date/Time   NA 140 01/29/2014 0439   NA 140 06/16/2013   K 5.2 01/29/2014 0439   CL 106 01/29/2014 0439   CO2 21 01/29/2014 0439   GLUCOSE 105* 01/29/2014 0439   GLUCOSE 92 02/05/2006 1419   BUN 18 01/29/2014 0439   BUN 24* 06/16/2013   CREATININE 1.36* 01/29/2014 0439   CREATININE 1.81* 11/19/2013 1125   CREATININE 1.4* 06/16/2013   CALCIUM 9.7 01/29/2014 0439   PROT 7.5 11/04/2013 1548   ALBUMIN 3.2* 11/04/2013 1548   AST 25  11/04/2013 1548   ALT 6 11/04/2013 1548   ALKPHOS 89 11/04/2013 1548   BILITOT 0.3 11/04/2013 1548   GFRNONAA 33* 01/29/2014 0439   GFRNONAA 24* 11/19/2013 1125   GFRAA 38* 01/29/2014 0439   GFRAA 28* 11/19/2013 1125    GFR Estimated Creatinine Clearance: 24.8 mL/min (by C-G formula based on Cr of 1.36). No results for input(s): LIPASE, AMYLASE in the last 72 hours. No results for input(s): CKTOTAL, CKMB, CKMBINDEX, TROPONINI in the last 72 hours. Invalid input(s): POCBNP No results for input(s): DDIMER in the last 72 hours. No results for input(s): HGBA1C in the last 72 hours. No results for input(s): CHOL, HDL, LDLCALC, TRIG, CHOLHDL, LDLDIRECT in the last 72 hours. No results for input(s): TSH, T4TOTAL, T3FREE, THYROIDAB in the last 72 hours.  Invalid input(s): FREET3 No results for input(s): VITAMINB12, FOLATE, FERRITIN, TIBC, IRON, RETICCTPCT in the last 72 hours. Coags: No results for input(s): INR in the last 72 hours.  Invalid input(s): PT Microbiology: Recent Results (from the past 240 hour(s))  Culture, blood (routine x 2) Call MD if unable to obtain prior to antibiotics being given     Status: None (Preliminary result)   Collection Time: 01/25/14 11:55 PM  Result Value Ref Range Status   Specimen Description BLOOD RIGHT HAND  Final   Special Requests BOTTLES DRAWN AEROBIC ONLY 5CC  Final   Culture  Setup Time   Final  01/26/2014 08:59 Performed at [facility]    Culture   Final           BLOOD CULTURE RECEIVED NO GROWTH TO DATE CULTURE WILL BE HELD FOR 5 DAYS BEFORE ISSUING A FINAL NEGATIVE REPORT Performed at [facility]    Report Status PENDING  Incomplete  Culture, blood (routine x 2) Call MD if unable to obtain prior to antibiotics being given     Status: None (Preliminary result)   Collection Time: 01/26/14 12:01 AM  Result Value Ref Range Status   Specimen Description BLOOD LEFT HAND  Final   Special Requests BOTTLES DRAWN AEROBIC AND  ANAEROBIC [facility] EACH  Final   Culture  Setup Time   Final    01/26/2014 08:59 Performed at [facility]    Culture   Final           BLOOD CULTURE RECEIVED NO GROWTH TO DATE CULTURE WILL BE HELD FOR 5 DAYS BEFORE ISSUING A FINAL NEGATIVE REPORT Performed at [facility]    Report Status PENDING  Incomplete  Culture, sputum-assessment     Status: None   Collection Time: 01/26/14  2:43 AM  Result Value Ref Range Status   Specimen Description SPUTUM  Final   Special Requests NONE  Final   Sputum evaluation   Final    MICROSCOPIC FINDINGS SUGGEST THAT THIS SPECIMEN IS NOT REPRESENTATIVE OF LOWER RESPIRATORY SECRETIONS. PLEASE RECOLLECT. RESULT CALLED TO, READ BACK BY AND VERIFIED WITH: NOTIFIED Deeann Dowse RN [phone] GREEN R    Report Status 01/26/2014 FINAL  Final  Culture, expectorated sputum-assessment     Status: None   Collection Time: 01/26/14 10:23 PM  Result Value Ref Range Status   Specimen Description SPUTUM  Final   Special Requests Immunocompromised  Final   Sputum evaluation   Final    THIS SPECIMEN IS ACCEPTABLE. RESPIRATORY CULTURE REPORT TO FOLLOW.   Report Status 01/26/2014 FINAL  Final  Culture, respiratory (NON-Expectorated)     Status: None   Collection Time: 01/26/14 10:23 PM  Result Value Ref Range Status   Specimen Description SPUTUM  Final   Special Requests NONE  Final   Gram Stain   Final    FEW WBC PRESENT, PREDOMINANTLY PMN FEW SQUAMOUS EPITHELIAL CELLS PRESENT FEW GRAM POSITIVE COCCI IN PAIRS Performed at [facility]    Culture   Final    NORMAL OROPHARYNGEAL FLORA Performed at [facility]    Report Status 01/29/2014 FINAL  Final     BRIEF HOSPITAL COURSE:   Hemoptysis:secondary to postobstructive pneumonia and presumed underlying lung malignancy. Initially thought to be most likely secondary to postobstructive pneumonia, however patient's hemoptysis continues in spite of broad-spectrum IV antibiotics. As  a result, it is most likely that her hemoptysis is from underlying lung mass.Unfortunately, no good options at this advanced age. PCCM was consulted, patient was followed in-house by Dr. Halford Chessman. After extensive discussion with the patient's today by myself, and also by Dr Halford Chessman, patient has decided against any further workup/intervention for hemoptysis, as these procedures carry significant risk and even with this therapy she would likely have recurrence at some point down the road. Current recommendations are for comfort/hospice care-for which patient was agreeable. Patient will be discharged back to her assisted living facility, she will continue with Levaquin for 5 more days. At some point if she continues to deteriorate, she will need to be transitioned to residential hospice.   Post Obstructive  DGU:[id number] to above. Hemoptysis unfortunately containing, she was started on IV vancomycin and IV Zosyn. On discharge she will be transitioned to Levaquin for 5 more days.She is being discharged back to assisted living facility, with hospice/palliative care.  Known RLL Lung Mass:likely malignancy (ex smoker). After d/w patient, she does not want treatment in form of chemo/RTx or surgery. No point in pursuing Bronchoscopy or Bx-for which patient is agreeable. This is likely contributing to hemoptysis as well. She was seen by palliative care medicine during this hospitalization. At this point patient does not want to pursue any procedures/aggressive interventions for hemoptysis. She is being transitioned back to assisted living with hospice follow-up. If she continues to deteriorate, she will need to be transitioned to residential hospice.  Hypothyroidism:on Levothyroxine, continue.  COPD:stable, continue Nebs and spiriva  Chronic Resp Failure:continue O2   LOV:FIEPP cough-hold of on re-starting ACEI. On discharge, Lasix and amlodipine will be continued.  Severe Aortic Stenosis:not a surgical  candidate. Outpatient monitoring if desired.   Hx of IRJ:JOACZYSAYT on coumadin, discontinued by Dr Stanford Breed on Aug 2015. On ASA-but on hold due to hemoptysis.  TODAY-DAY OF DISCHARGE:  Subjective:   Noralyn Karim today has no headache,no chest abdominal pain,no new weakness tingling or numbness.continues to have hemoptysis, but wishes to be discharged back to assisted living facility.  Objective:   Blood pressure 144/54, pulse 71, temperature 97.8 F (36.6 C), temperature source Oral, resp. rate 15, height 5\' 4"  (1.626 m), weight 66.679 kg (147 lb), SpO2 96 %.  Intake/Output Summary (Last 24 hours) at 01/29/14 1327 Last data filed at 01/29/14 0606  Gross per 24 hour  Intake    773 ml  Output      0 ml  Net    773 ml   Filed Weights   01/25/14 1930 01/25/14 2107  Weight: 65.318 kg (144 lb) 66.679 kg (147 lb)    Exam Awake Alert, Oriented *3, No new F.N deficits, Normal affect [place].AT,PERRAL Supple Neck,No JVD, No cervical lymphadenopathy appriciated.  Symmetrical Chest wall movement, Good air movement bilaterally, bibasilar rales. RRR,No Gallops,Rubs or new Murmurs, No Parasternal Heave +ve B.Sounds, Abd Soft, Non tender, No organomegaly appriciated, No rebound -guarding or rigidity. No Cyanosis, Clubbing or edema, No new Rash or bruise  DISCHARGE CONDITION: Stable  DISPOSITION: ALF -with hospice follow-up  DISCHARGE INSTRUCTIONS:    Activity:  As tolerated with Full fall precautions use walker/cane & assistance as needed  Diet recommendation: Heart Healthy diet  Discharge Instructions    Call MD for:  difficulty breathing, headache or visual disturbances    Complete by:  As directed      Diet - low sodium heart healthy    Complete by:  As directed      Increase activity slowly    Complete by:  As directed            Follow-up Information    Follow up with POLITE,RONALD D, MD. Schedule an appointment as soon as possible for a visit in 1 week.    Specialty:  Internal Medicine   Contact information:   [address] [place] [address] [phone]       Follow up with Chesley Mires, MD.   Specialty:  Pulmonary Disease   Why:  As needed   Contact information:   [address] [address] [phone]       Total Time spent on discharge equals 45 minutes.  SignedOren Binet 01/29/2014 1:27 PM

## 2014-01-29 NOTE — Care Management Note (Signed)
  Page 2 of 2   01/29/2014     2:02:35 PM CARE MANAGEMENT NOTE 01/29/2014  Patient:  [name], [name]   Account Number:  [id number]  Date Initiated:  01/29/2014  Documentation initiated by:  Magdalen Spatz  Subjective/Objective Assessment:     Action/Plan:   Anticipated DC Date:  01/29/2014   Anticipated DC Plan:  ASSISTED LIVING / REST HOME  In-house referral  Hospice / Palliative Care  Clinical Social Worker         Choice offered to / List presented to:             Status of service:   Medicare Important Message given?  YES (If response is "NO", the following Medicare IM given date fields will be blank) Date Medicare IM given:  01/29/2014 Medicare IM given by:  Magdalen Spatz Date Additional Medicare IM given:   Additional Medicare IM given by:    Discharge Disposition:    Per UR Regulation:    If discussed at Long Length of Stay Meetings, dates discussed:    Comments:  01-29-14 MD ordered NEB machine and suction . Spoke with RN at [employer] they have both at [employer] , instructed to send orders in packet with patient . Same done. [facility] transporting patient and have portable oxygen . Magdalen Spatz RN BSN    01-29-14 Spoke to patient patient and daughter at bedside. Patient states she gets her oxygen through [place] , but has been waiting months for a smaller portable oxygen tank .  [name] [phone] , spoke to [name] . Patient was approved for the unit called [employer] , however, that unit is on back order and unsure when it will be available. However, there are other options available to the patient . [name] requested patient call her directly to discuss her options . Explained same to patient and family . Patient agreeable to call [facility] number and [name]'s name provided to patient.   Also discussed referral for outpatient palliative care to follow her for continued goals of care and symptom management  through [facility] . Patient and  daughter in agreement . Referral gioven to [facility] at [facility] , information faxed to [phone] .  Magdalen Spatz RN BSN [phone]

## 2014-01-29 NOTE — Clinical Social Work Note (Signed)
Patient will discharge to [facility] Anticipated discharge date:01/29/14 Transportation by [facility] [employer] transport  [place] signing off.  Domenica Reamer, [facility] Social Worker [phone]

## 2014-01-29 NOTE — Progress Notes (Signed)
I had extensive conversation with pt.  Explained that her hemoptysis is most likely related to RLL mass which is almost certainly a malignancy.  I explained that options treat this are only palliative and not curative.  Also these therapies carry considerable risk.  Even if she had therapy at this time, she would like have recurrence of complications at some point down the road.  After discussing this she has decided that she would not want to have any interventions for her presumed lung cancer.  She would instead like to focus on symptom management and comfort measures.  We discussed the option of assessing for outpt hospice care and she was very interested in this option.  Plan d/w Dr. Sloan Leiter.  PCCM can be available as needed.

## 2014-01-29 NOTE — Progress Notes (Signed)
0600 Patient cough up mod amt of dark red blood this shift

## 2014-02-01 ENCOUNTER — Other Ambulatory Visit: Payer: Self-pay | Admitting: *Deleted

## 2014-02-01 LAB — CULTURE, BLOOD (ROUTINE X 2)
CULTURE: NO GROWTH
Culture: NO GROWTH

## 2014-03-29 ENCOUNTER — Ambulatory Visit
Admission: RE | Admit: 2014-03-29 | Discharge: 2014-03-29 | Disposition: A | Discharge status: Home / Self Care | Payer: Medicare Other | Source: Ambulatory Visit | Attending: Internal Medicine | Admitting: Internal Medicine

## 2014-03-29 ENCOUNTER — Other Ambulatory Visit: Payer: Self-pay | Admitting: Internal Medicine

## 2014-03-29 DIAGNOSIS — M545 Low back pain: Secondary | ICD-10-CM

## 2014-05-18 NOTE — Progress Notes (Signed)
HPI: FU congestive heart failure. Carotid Dopplers in February of 2014 showed an occluded left carotid artery and no significant obstruction on the right. Echocardiogram in August 2015 showed LVH, restrictive filling, severe AS, mild AI, severe MR, biatrial enlargement, moderate TR, moderately elevated pulmonary pressures. Admitted 8/15 with GI bleed; seen by GI and felt possibly related to hemorrhoids. Also with ? atrial fibrillation on ECG. AS treated conservatively at pts request and due to age. Coumadin DCed previously. Chest CT October 2015 showed right lower lobe mass concerning for bronchogenic carcinoma. Also increase in right middle lobe nodule. Since last seen,   Current Outpatient Prescriptions  Medication Sig Dispense Refill  . acetaminophen (TYLENOL) 325 MG tablet Take 650 mg by mouth every 6 (six) hours as needed for mild pain.     Marland Kitchen acyclovir ointment (ZOVIRAX) 5 % Apply 1 application topically every 3 (three) hours as needed. For cold sores    . albuterol (PROVENTIL HFA;VENTOLIN HFA) 108 (90 BASE) MCG/ACT inhaler Inhale 2 puffs into the lungs every 6 (six) hours as needed for wheezing or shortness of breath.    Marland Kitchen albuterol (PROVENTIL) (2.5 MG/3ML) 0.083% nebulizer solution Take 3 mLs (2.5 mg total) by nebulization every 2 (two) hours as needed for wheezing. 75 mL 12  . Alum & Mag Hydroxide-Simeth (MAGIC MOUTHWASH W/LIDOCAINE) SOLN Take 5 mLs by mouth 4 (four) times daily as needed for mouth pain.  0  . amLODipine (NORVASC) 10 MG tablet Take 0.5 tablets (5 mg total) by mouth daily.    Marland Kitchen amoxicillin (AMOXIL) 500 MG capsule Take 2,000 mg by mouth once. Prior to dental procedure    . augmented betamethasone dipropionate (DIPROLENE-AF) 0.05 % ointment Apply 1 application topically 2 (two) times daily.    . benzonatate (TESSALON) 200 MG capsule Take 1 capsule (200 mg total) by mouth 3 (three) times daily as needed for cough. 20 capsule 0  . betamethasone dipropionate (DIPROLENE) 0.05  % cream Apply 1 application topically 2 (two) times daily.    . cetirizine (ZYRTEC) 10 MG tablet Take 10 mg by mouth daily.    . chlorhexidine (PERIDEX) 0.12 % solution Use as directed 15 mLs in the mouth or throat. swish 15 mls for 30 sec then spit, twice daily    . chlorpheniramine-HYDROcodone (TUSSIONEX) 10-8 MG/5ML LQCR Take 5 mLs by mouth every 12 (twelve) hours as needed for cough. 115 mL 0  . Clocortolone Pivalate (CLODERM) 0.1 % cream Apply 1 application topically 2 (two) times daily. Applies to lesion on left cheek.    . coal tar (NEUTROGENA T-GEL) 0.5 % shampoo Apply 1 application topically See admin instructions. She uses on Tuesday and Friday.    . donepezil (ARICEPT) 10 MG tablet Take 10 mg by mouth at bedtime.     . Emollient (CERAVE) CREA Apply 1 application topically daily. Applies to dry skin.    Marland Kitchen escitalopram (LEXAPRO) 20 MG tablet Take 20 mg by mouth every morning.     . furosemide (LASIX) 40 MG tablet Take 1 tablet (40 mg total) by mouth every morning. 30 tablet 0  . gabapentin (NEURONTIN) 100 MG capsule Take 200 mg by mouth at bedtime.     . hydrOXYzine (ATARAX/VISTARIL) 25 MG tablet Take 25 mg by mouth at bedtime.    Marland Kitchen levofloxacin (LEVAQUIN) 500 MG tablet Take 1 tablet (500 mg total) by mouth daily. Take for 5 more days from 01/29/14    . levothyroxine (SYNTHROID, LEVOTHROID) 88 MCG tablet  Take 88 mcg by mouth daily before breakfast.     . lidocaine (LIDODERM) 5 % Place 1 patch onto the skin daily. Remove & Discard patch within 12 hours or as directed by MD 30 patch 0  . LORazepam (ATIVAN) 2 MG/ML concentrated solution Take 0.5 mLs (1 mg total) by mouth every 6 (six) hours as needed for anxiety or sedation. 30 mL 0  . Morphine Sulfate (MORPHINE CONCENTRATE) 10 mg / 0.5 ml concentrated solution Take 0.25 mLs (5 mg total) by mouth every 4 (four) hours as needed for severe pain, anxiety or shortness of breath. 120 mL 0  . Multiple Vitamins-Minerals (PRESERVISION AREDS) CAPS Take  1 capsule by mouth 2 (two) times daily with a meal.     . mupirocin ointment (BACTROBAN) 2 % Place 1 application into the nose daily.    Marland Kitchen olopatadine (PATANOL) 0.1 % ophthalmic solution Place 1 drop into both eyes 2 (two) times daily as needed. For dry eyes    . polyethylene glycol (MIRALAX / GLYCOLAX) packet Take 17 g by mouth every morning.     . pramoxine-mineral oil-zinc (TUCKS) 1-12.5 % rectal ointment Place 1 application rectally every 2 (two) hours as needed for itching (itching).    . pravastatin (PRAVACHOL) 40 MG tablet Take 1 tablet (40 mg total) by mouth every evening. 90 tablet 3  . Propylene Glycol (SYSTANE BALANCE) 0.6 % SOLN Place 1 drop into both eyes daily as needed (For dry or itchy eyes.). One drop both eye as needed    . senna-docusate (SENOKOT S) 8.6-50 MG per tablet Take 1-2 tablets by mouth See admin instructions. She takes one tablet in the morning and two tablets at bedtime.    Marland Kitchen tiotropium (SPIRIVA) 18 MCG inhalation capsule Place 18 mcg into inhaler and inhale daily.     No current facility-administered medications for this visit.     Past Medical History  Diagnosis Date  . Cholelithiases   . Hypothyroid   . Leg paresthesia   . Aortic valve stenosis   . Arthritis   . Hyperlipidemia   . Dementia   . Carotid artery occlusion   . Renal disorder   . Hypertension   . COPD (chronic obstructive pulmonary disease)   . SEIZURE DISORDER 11/05/2006  . GERD 11/05/2006  . CVA 02/07/2007  . Macular degeneration of both eyes   . Blind in both eyes   . Peripheral neuropathy   . Pneumonia     "many times"  . Internal hemorrhoid, bleeding   . Malaria     "as a child"  . Skin cancer     "nose, face mostly"  . Chronic lower back pain     "when I go to the bathroom"  . Intestinal infection due to Clostridium difficile 09/2011  . Benign neoplasm of breast   . Gout, unspecified   . Anemia   . Anxiety   . Other dysfunctions of sleep stages or arousal from sleep   .  Depression   . Chronic rhinitis   . Abscess of lung(513.0)     RUL  . Diaphragmatic hernia without mention of obstruction or gangrene   . Diverticulosis of colon (without mention of hemorrhage)   . Acute duodenal ulcer with perforation and obstruction 08/2011    perforation  . Hemorrhage of rectum and anus   . Abscess of intestine 09/2011    drainage  . Other psoriasis   . Lumbago   . Nonspecific elevation of levels of  transaminase or lactic acid dehydrogenase (LDH)   . Solitary pulmonary nodule     RLL nodule 1 cm  . Transient ischemic attack (TIA), and cerebral infarction without residual deficits(V12.54)   . Long term (current) use of anticoagulants   . Sacroiliitis 08/27/2012  . Anemia, iron deficiency 10/16/2011  . Congestive heart failure   . Spondylosis of lumbosacral region 09/24/2012    L/S xray 08/2012: Mild to moderate osteoarthritis and degenerative spondylosis he diffusely. Anterior subluxation of L4 relative to L5 at 5.7 cm. Mild to moderate levoconvex curvature the mid lumbar spine    . Dysrhythmia     hx atrial fib  . Arteritis     hx giant cell arteritis  . Aortic stenosis, severe 03/02/2013    Followed since 2004 with mild aortic valve stenosis and mild regurgitation. Echocardiograms 2006-2013 show  progression from mild to moderate aortic stenosis, mild insufficiency. Mild to moderate mitral valve regurgitation evident in 2013.   07/25/11: Worsening valve disease by echocardiogram, pt is not interested in invasive treatment. Symptoms appear stable for now. Continue to monitor       Past Surgical History  Procedure Laterality Date  . Vesicovaginal fistula closure w/ tah    . Tonsillectomy    . Breast lumpectomy      bilaterally  . Cataract extraction w/ intraocular lens  implant, bilateral    . Skin cancer excision      "nose and face"  . Abdominal hysterectomy    . Hemorrhoid surgery  11/2008    banding    History   Social History  . Marital Status: Widowed     Spouse Name: N/A  . Number of Children: 3  . Years of Education: N/A   Occupational History  .     Social History Main Topics  . Smoking status: Former Smoker -- 1.00 packs/day for 50 years    Types: Cigarettes    Quit date: 08/08/1993  . Smokeless tobacco: Never Used  . Alcohol Use: Yes     Comment: 10/15/11 "have a drink when I go out to dinner; don't go out often"  . Drug Use: No  . Sexual Activity: No   Other Topics Concern  . Not on file   Social History Narrative   ** Merged History Encounter **        ROS: no fevers or chills, productive cough, hemoptysis, dysphasia, odynophagia, melena, hematochezia, dysuria, hematuria, rash, seizure activity, orthopnea, PND, pedal edema, claudication. Remaining systems are negative.  Physical Exam: Well-developed well-nourished in no acute distress.  Skin is warm and dry.  HEENT is normal.  Neck is supple.  Chest is clear to auscultation with normal expansion.  Cardiovascular exam is regular rate and rhythm.  Abdominal exam nontender or distended. No masses palpated. Extremities show no edema. neuro grossly intact  ECG     This encounter was created in error - please disregard.

## 2014-05-19 ENCOUNTER — Telehealth: Payer: Self-pay | Admitting: Cardiology

## 2014-05-19 ENCOUNTER — Encounter: Payer: Medicare Other | Admitting: Cardiology

## 2014-05-19 NOTE — Telephone Encounter (Signed)
Unable to reach pt or leave a message  

## 2014-05-19 NOTE — Telephone Encounter (Signed)
New problem    Pt has pitting edema in her feet and want to know if they can increase her Lasix. Please advise.

## 2014-05-20 NOTE — Telephone Encounter (Signed)
Spoke with pt caregiver, varnell, they talked with dr polite's office yesterday and he increased her lasix for the next 3 days. They will call back with further concerns.

## 2014-05-24 ENCOUNTER — Ambulatory Visit: Payer: Medicare Other | Admitting: Pulmonary Disease

## 2014-05-25 ENCOUNTER — Ambulatory Visit
Admission: RE | Admit: 2014-05-25 | Discharge: 2014-05-25 | Disposition: A | Discharge status: Home / Self Care | Payer: Medicare Other | Source: Ambulatory Visit | Attending: Internal Medicine | Admitting: Internal Medicine

## 2014-05-25 ENCOUNTER — Emergency Department (HOSPITAL_COMMUNITY)

## 2014-05-25 ENCOUNTER — Other Ambulatory Visit: Payer: Self-pay | Admitting: Internal Medicine

## 2014-05-25 ENCOUNTER — Other Ambulatory Visit (HOSPITAL_COMMUNITY): Payer: Self-pay

## 2014-05-25 ENCOUNTER — Inpatient Hospital Stay (HOSPITAL_COMMUNITY)
Admission: EM | Admit: 2014-05-25 | Discharge: 2014-05-28 | DRG: 469 | Disposition: A | Discharge status: Skilled Nursing Facility | Attending: Internal Medicine | Admitting: Internal Medicine

## 2014-05-25 ENCOUNTER — Encounter (HOSPITAL_COMMUNITY): Payer: Self-pay | Admitting: General Practice

## 2014-05-25 DIAGNOSIS — Z8711 Personal history of peptic ulcer disease: Secondary | ICD-10-CM

## 2014-05-25 DIAGNOSIS — Z96649 Presence of unspecified artificial hip joint: Secondary | ICD-10-CM

## 2014-05-25 DIAGNOSIS — R918 Other nonspecific abnormal finding of lung field: Secondary | ICD-10-CM

## 2014-05-25 DIAGNOSIS — J449 Chronic obstructive pulmonary disease, unspecified: Secondary | ICD-10-CM | POA: Diagnosis present

## 2014-05-25 DIAGNOSIS — Z7901 Long term (current) use of anticoagulants: Secondary | ICD-10-CM | POA: Diagnosis not present

## 2014-05-25 DIAGNOSIS — Z9109 Other allergy status, other than to drugs and biological substances: Secondary | ICD-10-CM

## 2014-05-25 DIAGNOSIS — K5730 Diverticulosis of large intestine without perforation or abscess without bleeding: Secondary | ICD-10-CM | POA: Diagnosis present

## 2014-05-25 DIAGNOSIS — K219 Gastro-esophageal reflux disease without esophagitis: Secondary | ICD-10-CM | POA: Diagnosis present

## 2014-05-25 DIAGNOSIS — Z888 Allergy status to other drugs, medicaments and biological substances status: Secondary | ICD-10-CM | POA: Diagnosis not present

## 2014-05-25 DIAGNOSIS — K8020 Calculus of gallbladder without cholecystitis without obstruction: Secondary | ICD-10-CM | POA: Diagnosis present

## 2014-05-25 DIAGNOSIS — Z515 Encounter for palliative care: Secondary | ICD-10-CM

## 2014-05-25 DIAGNOSIS — Z853 Personal history of malignant neoplasm of breast: Secondary | ICD-10-CM

## 2014-05-25 DIAGNOSIS — H3530 Unspecified macular degeneration: Secondary | ICD-10-CM | POA: Diagnosis present

## 2014-05-25 DIAGNOSIS — Z7951 Long term (current) use of inhaled steroids: Secondary | ICD-10-CM

## 2014-05-25 DIAGNOSIS — Z87891 Personal history of nicotine dependence: Secondary | ICD-10-CM

## 2014-05-25 DIAGNOSIS — M545 Low back pain: Secondary | ICD-10-CM | POA: Diagnosis present

## 2014-05-25 DIAGNOSIS — E785 Hyperlipidemia, unspecified: Secondary | ICD-10-CM | POA: Diagnosis present

## 2014-05-25 DIAGNOSIS — J42 Unspecified chronic bronchitis: Secondary | ICD-10-CM

## 2014-05-25 DIAGNOSIS — Z01811 Encounter for preprocedural respiratory examination: Secondary | ICD-10-CM

## 2014-05-25 DIAGNOSIS — G629 Polyneuropathy, unspecified: Secondary | ICD-10-CM | POA: Diagnosis present

## 2014-05-25 DIAGNOSIS — M1990 Unspecified osteoarthritis, unspecified site: Secondary | ICD-10-CM | POA: Diagnosis present

## 2014-05-25 DIAGNOSIS — I129 Hypertensive chronic kidney disease with stage 1 through stage 4 chronic kidney disease, or unspecified chronic kidney disease: Secondary | ICD-10-CM | POA: Diagnosis present

## 2014-05-25 DIAGNOSIS — E039 Hypothyroidism, unspecified: Secondary | ICD-10-CM | POA: Diagnosis present

## 2014-05-25 DIAGNOSIS — N183 Chronic kidney disease, stage 3 (moderate): Secondary | ICD-10-CM | POA: Diagnosis present

## 2014-05-25 DIAGNOSIS — G8929 Other chronic pain: Secondary | ICD-10-CM | POA: Diagnosis present

## 2014-05-25 DIAGNOSIS — M109 Gout, unspecified: Secondary | ICD-10-CM | POA: Diagnosis present

## 2014-05-25 DIAGNOSIS — I480 Paroxysmal atrial fibrillation: Secondary | ICD-10-CM

## 2014-05-25 DIAGNOSIS — Z85828 Personal history of other malignant neoplasm of skin: Secondary | ICD-10-CM | POA: Diagnosis not present

## 2014-05-25 DIAGNOSIS — E876 Hypokalemia: Secondary | ICD-10-CM | POA: Diagnosis not present

## 2014-05-25 DIAGNOSIS — I482 Chronic atrial fibrillation: Secondary | ICD-10-CM | POA: Diagnosis present

## 2014-05-25 DIAGNOSIS — H540 Blindness, both eyes: Secondary | ICD-10-CM | POA: Diagnosis present

## 2014-05-25 DIAGNOSIS — G40909 Epilepsy, unspecified, not intractable, without status epilepticus: Secondary | ICD-10-CM | POA: Diagnosis present

## 2014-05-25 DIAGNOSIS — I509 Heart failure, unspecified: Secondary | ICD-10-CM | POA: Diagnosis present

## 2014-05-25 DIAGNOSIS — Z6824 Body mass index (BMI) 24.0-24.9, adult: Secondary | ICD-10-CM | POA: Diagnosis not present

## 2014-05-25 DIAGNOSIS — C3431 Malignant neoplasm of lower lobe, right bronchus or lung: Secondary | ICD-10-CM | POA: Diagnosis present

## 2014-05-25 DIAGNOSIS — Z8673 Personal history of transient ischemic attack (TIA), and cerebral infarction without residual deficits: Secondary | ICD-10-CM

## 2014-05-25 DIAGNOSIS — E43 Unspecified severe protein-calorie malnutrition: Secondary | ICD-10-CM | POA: Diagnosis present

## 2014-05-25 DIAGNOSIS — F418 Other specified anxiety disorders: Secondary | ICD-10-CM | POA: Diagnosis present

## 2014-05-25 DIAGNOSIS — Z66 Do not resuscitate: Secondary | ICD-10-CM | POA: Diagnosis present

## 2014-05-25 DIAGNOSIS — M25551 Pain in right hip: Secondary | ICD-10-CM

## 2014-05-25 DIAGNOSIS — Z90710 Acquired absence of both cervix and uterus: Secondary | ICD-10-CM | POA: Diagnosis not present

## 2014-05-25 DIAGNOSIS — I10 Essential (primary) hypertension: Secondary | ICD-10-CM | POA: Diagnosis present

## 2014-05-25 DIAGNOSIS — Z881 Allergy status to other antibiotic agents status: Secondary | ICD-10-CM

## 2014-05-25 DIAGNOSIS — S72011A Unspecified intracapsular fracture of right femur, initial encounter for closed fracture: Principal | ICD-10-CM

## 2014-05-25 DIAGNOSIS — I4891 Unspecified atrial fibrillation: Secondary | ICD-10-CM | POA: Diagnosis present

## 2014-05-25 DIAGNOSIS — S72009A Fracture of unspecified part of neck of unspecified femur, initial encounter for closed fracture: Secondary | ICD-10-CM | POA: Diagnosis present

## 2014-05-25 DIAGNOSIS — F0390 Unspecified dementia without behavioral disturbance: Secondary | ICD-10-CM | POA: Diagnosis present

## 2014-05-25 LAB — PROTIME-INR
INR: 1.15 (ref 0.00–1.49)
Prothrombin Time: 14.9 seconds (ref 11.6–15.2)

## 2014-05-25 LAB — CBC
HEMATOCRIT: 31.6 % — AB (ref 36.0–46.0)
Hemoglobin: 9.6 g/dL — ABNORMAL LOW (ref 12.0–15.0)
MCH: 24.5 pg — ABNORMAL LOW (ref 26.0–34.0)
MCHC: 30.4 g/dL (ref 30.0–36.0)
MCV: 80.6 fL (ref 78.0–100.0)
Platelets: 337 10*3/uL (ref 150–400)
RBC: 3.92 MIL/uL (ref 3.87–5.11)
RDW: 16.8 % — ABNORMAL HIGH (ref 11.5–15.5)
WBC: 10 10*3/uL (ref 4.0–10.5)

## 2014-05-25 LAB — BASIC METABOLIC PANEL
Anion gap: 11 (ref 5–15)
BUN: 14 mg/dL (ref 6–23)
CHLORIDE: 99 mmol/L (ref 96–112)
CO2: 28 mmol/L (ref 19–32)
Calcium: 8.6 mg/dL (ref 8.4–10.5)
Creatinine, Ser: 1.46 mg/dL — ABNORMAL HIGH (ref 0.50–1.10)
GFR calc Af Amer: 35 mL/min — ABNORMAL LOW (ref >90)
GFR calc non Af Amer: 30 mL/min — ABNORMAL LOW (ref >90)
GLUCOSE: 116 mg/dL — AB (ref 70–99)
Potassium: 2.9 mmol/L — ABNORMAL LOW (ref 3.5–5.1)
Sodium: 138 mmol/L (ref 135–145)

## 2014-05-25 LAB — TYPE AND SCREEN
ABO/RH(D): A POS
Antibody Screen: NEGATIVE

## 2014-05-25 MED ORDER — LEVOTHYROXINE SODIUM 88 MCG PO TABS
88.0000 ug | ORAL_TABLET | Freq: Every day | ORAL | Status: DC
  Administered 2014-05-26 – 2014-05-28 (×3): 88 ug via ORAL
  Filled 2014-05-25 (×3): qty 1

## 2014-05-25 MED ORDER — SENNOSIDES-DOCUSATE SODIUM 8.6-50 MG PO TABS
2.0000 | ORAL_TABLET | Freq: Every day | ORAL | Status: DC
  Administered 2014-05-25 – 2014-05-27 (×3): 2 via ORAL
  Filled 2014-05-25 (×4): qty 2

## 2014-05-25 MED ORDER — MAGNESIUM CITRATE PO SOLN: 1.0000 | Freq: Once | ORAL | Status: AC | PRN

## 2014-05-25 MED ORDER — TIOTROPIUM BROMIDE MONOHYDRATE 18 MCG IN CAPS
18.0000 ug | ORAL_CAPSULE | Freq: Every day | RESPIRATORY_TRACT | Status: DC
  Administered 2014-05-26 – 2014-05-28 (×2): 18 ug via RESPIRATORY_TRACT
  Filled 2014-05-25: qty 5

## 2014-05-25 MED ORDER — PRAVASTATIN SODIUM 40 MG PO TABS
40.0000 mg | ORAL_TABLET | Freq: Every evening | ORAL | Status: DC
  Administered 2014-05-27 – 2014-05-28 (×2): 40 mg via ORAL
  Filled 2014-05-25 (×3): qty 1

## 2014-05-25 MED ORDER — AMLODIPINE BESYLATE 5 MG PO TABS
5.0000 mg | ORAL_TABLET | Freq: Every day | ORAL | Status: DC
  Administered 2014-05-26 – 2014-05-28 (×3): 5 mg via ORAL
  Filled 2014-05-25 (×3): qty 1

## 2014-05-25 MED ORDER — MORPHINE SULFATE 2 MG/ML IJ SOLN
2.0000 mg | Freq: Once | INTRAMUSCULAR | Status: AC
  Administered 2014-05-25: 2 mg via INTRAVENOUS
  Filled 2014-05-25: qty 1

## 2014-05-25 MED ORDER — POLYVINYL ALCOHOL 1.4 % OP SOLN
1.0000 [drp] | Freq: Every day | OPHTHALMIC | Status: DC | PRN
  Filled 2014-05-25: qty 15

## 2014-05-25 MED ORDER — FLUTICASONE PROPIONATE 50 MCG/ACT NA SUSP
2.0000 | Freq: Every day | NASAL | Status: DC
  Administered 2014-05-26 – 2014-05-28 (×3): 2 via NASAL
  Filled 2014-05-25: qty 16

## 2014-05-25 MED ORDER — LORAZEPAM 2 MG/ML PO CONC: 1.0000 mg | Freq: Four times a day (QID) | ORAL | Status: DC | PRN

## 2014-05-25 MED ORDER — ONDANSETRON HCL 4 MG/2ML IJ SOLN
4.0000 mg | Freq: Once | INTRAMUSCULAR | Status: AC
  Administered 2014-05-25: 4 mg via INTRAVENOUS
  Filled 2014-05-25: qty 2

## 2014-05-25 MED ORDER — HYDROXYZINE HCL 25 MG PO TABS
25.0000 mg | ORAL_TABLET | Freq: Every day | ORAL | Status: DC
  Administered 2014-05-25 – 2014-05-27 (×3): 25 mg via ORAL
  Filled 2014-05-25 (×3): qty 1

## 2014-05-25 MED ORDER — SODIUM CHLORIDE 0.9 % IV SOLN
INTRAVENOUS | Status: DC
  Administered 2014-05-25: 1000 mL via INTRAVENOUS

## 2014-05-25 MED ORDER — SODIUM CHLORIDE 0.9 % IV SOLN
INTRAVENOUS | Status: DC
  Administered 2014-05-25 – 2014-05-26 (×2): via INTRAVENOUS

## 2014-05-25 MED ORDER — DONEPEZIL HCL 10 MG PO TABS
10.0000 mg | ORAL_TABLET | Freq: Every day | ORAL | Status: DC
  Administered 2014-05-25 – 2014-05-27 (×3): 10 mg via ORAL
  Filled 2014-05-25 (×4): qty 1

## 2014-05-25 MED ORDER — HYDROCODONE-ACETAMINOPHEN 5-325 MG PO TABS
1.0000 | ORAL_TABLET | Freq: Four times a day (QID) | ORAL | Status: DC | PRN
  Administered 2014-05-26: 2 via ORAL
  Filled 2014-05-25: qty 2

## 2014-05-25 MED ORDER — ACETAMINOPHEN 325 MG PO TABS: 650.0000 mg | ORAL_TABLET | Freq: Four times a day (QID) | ORAL | Status: DC | PRN

## 2014-05-25 MED ORDER — SODIUM CHLORIDE 0.9 % IV BOLUS (SEPSIS): 1000.0000 mL | Freq: Once | INTRAVENOUS | Status: DC

## 2014-05-25 MED ORDER — ESCITALOPRAM OXALATE 10 MG PO TABS
20.0000 mg | ORAL_TABLET | Freq: Every morning | ORAL | Status: DC
  Administered 2014-05-26 – 2014-05-28 (×3): 20 mg via ORAL
  Filled 2014-05-25 (×5): qty 2

## 2014-05-25 MED ORDER — POTASSIUM CHLORIDE CRYS ER 20 MEQ PO TBCR
20.0000 meq | EXTENDED_RELEASE_TABLET | Freq: Once | ORAL | Status: AC
  Administered 2014-05-25: 20 meq via ORAL
  Filled 2014-05-25: qty 1

## 2014-05-25 MED ORDER — GABAPENTIN 100 MG PO CAPS
200.0000 mg | ORAL_CAPSULE | Freq: Every day | ORAL | Status: DC
  Administered 2014-05-25 – 2014-05-27 (×3): 200 mg via ORAL
  Filled 2014-05-25 (×3): qty 2

## 2014-05-25 MED ORDER — MORPHINE SULFATE (CONCENTRATE) 10 MG /0.5 ML PO SOLN: 5.0000 mg | ORAL | Status: DC | PRN

## 2014-05-25 MED ORDER — ALBUTEROL SULFATE (2.5 MG/3ML) 0.083% IN NEBU: 2.5000 mg | INHALATION_SOLUTION | RESPIRATORY_TRACT | Status: DC | PRN

## 2014-05-25 MED ORDER — HEPARIN SODIUM (PORCINE) 5000 UNIT/ML IJ SOLN
5000.0000 [IU] | Freq: Three times a day (TID) | INTRAMUSCULAR | Status: DC
  Administered 2014-05-25: 5000 [IU] via SUBCUTANEOUS

## 2014-05-25 MED ORDER — MORPHINE SULFATE 2 MG/ML IJ SOLN
0.5000 mg | INTRAMUSCULAR | Status: DC | PRN
  Administered 2014-05-26 – 2014-05-27 (×2): 0.5 mg via INTRAVENOUS
  Filled 2014-05-25 (×2): qty 1

## 2014-05-25 MED ORDER — METHOCARBAMOL 1000 MG/10ML IJ SOLN
500.0000 mg | Freq: Four times a day (QID) | INTRAVENOUS | Status: DC | PRN
  Filled 2014-05-25: qty 5

## 2014-05-25 MED ORDER — METHOCARBAMOL 500 MG PO TABS: 500.0000 mg | ORAL_TABLET | Freq: Four times a day (QID) | ORAL | Status: DC | PRN

## 2014-05-25 MED ORDER — LORAZEPAM 0.5 MG PO TABS: 1.0000 mg | ORAL_TABLET | Freq: Four times a day (QID) | ORAL | Status: DC | PRN

## 2014-05-25 MED ORDER — BISACODYL 10 MG RE SUPP: 10.0000 mg | Freq: Every day | RECTAL | Status: DC | PRN

## 2014-05-25 MED ORDER — SENNOSIDES-DOCUSATE SODIUM 8.6-50 MG PO TABS
1.0000 | ORAL_TABLET | Freq: Every day | ORAL | Status: DC
  Administered 2014-05-26 – 2014-05-28 (×3): 1 via ORAL
  Filled 2014-05-25 (×2): qty 1

## 2014-05-25 NOTE — ED Provider Notes (Addendum)
CSN: [id number]     Arrival date & time 05/25/14  1819 History   First MD Initiated Contact with Patient 05/25/14 1823     Chief Complaint  Patient presents with  . Hip Injury     (Consider location/radiation/quality/duration/timing/severity/associated sxs/prior Treatment) HPI Pt c/o right hip pain for 1 week. Pain constant, dull, moderate-severe, worse w attempted weight bearing or movement hip. Pt denies preceding fall or injury. No radicular pain. No numbness/weakness. States normally walks minimally w walker. States otherwise recent health at baseline.  Saw pcp and had outpatient xrays, pt unsure of results.       Past Medical History  Diagnosis Date  . Cholelithiases   . Hypothyroid   . Leg paresthesia   . Aortic valve stenosis   . Arthritis   . Hyperlipidemia   . Dementia   . Carotid artery occlusion   . Renal disorder   . Hypertension   . COPD (chronic obstructive pulmonary disease)   . SEIZURE DISORDER 11/05/2006  . GERD 11/05/2006  . CVA 02/07/2007  . Macular degeneration of both eyes   . Blind in both eyes   . Peripheral neuropathy   . Pneumonia     "many times"  . Internal hemorrhoid, bleeding   . Malaria     "as a child"  . Skin cancer     "nose, face mostly"  . Chronic lower back pain     "when I go to the bathroom"  . Intestinal infection due to Clostridium difficile 09/2011  . Benign neoplasm of breast   . Gout, unspecified   . Anemia   . Anxiety   . Other dysfunctions of sleep stages or arousal from sleep   . Depression   . Chronic rhinitis   . Abscess of lung(513.0)     RUL  . Diaphragmatic hernia without mention of obstruction or gangrene   . Diverticulosis of colon (without mention of hemorrhage)   . Acute duodenal ulcer with perforation and obstruction 08/2011    perforation  . Hemorrhage of rectum and anus   . Abscess of intestine 09/2011    drainage  . Other psoriasis   . Lumbago   . Nonspecific elevation of levels of transaminase or  lactic acid dehydrogenase (LDH)   . Solitary pulmonary nodule     RLL nodule 1 cm  . Transient ischemic attack (TIA), and cerebral infarction without residual deficits(V12.54)   . Long term (current) use of anticoagulants   . Sacroiliitis 08/27/2012  . Anemia, iron deficiency 10/16/2011  . Congestive heart failure   . Spondylosis of lumbosacral region 09/24/2012    L/S xray 08/2012: Mild to moderate osteoarthritis and degenerative spondylosis he diffusely. Anterior subluxation of L4 relative to L5 at 5.7 cm. Mild to moderate levoconvex curvature the mid lumbar spine    . Dysrhythmia     hx atrial fib  . Arteritis     hx giant cell arteritis  . Aortic stenosis, severe 03/02/2013    Followed since 2004 with mild aortic valve stenosis and mild regurgitation. Echocardiograms 2006-2013 show  progression from mild to moderate aortic stenosis, mild insufficiency. Mild to moderate mitral valve regurgitation evident in 2013.   07/25/11: Worsening valve disease by echocardiogram, pt is not interested in invasive treatment. Symptoms appear stable for now. Continue to monitor      Past Surgical History  Procedure Laterality Date  . Vesicovaginal fistula closure w/ tah    . Tonsillectomy    . Breast lumpectomy  bilaterally  . Cataract extraction w/ intraocular lens  implant, bilateral    . Skin cancer excision      "nose and face"  . Abdominal hysterectomy    . Hemorrhoid surgery  11/2008    banding   Family History  Problem Relation Age of Onset  . Hyperlipidemia    . Hypertension    . Stroke    . Colon cancer Daughter   . Heart attack Father   . Kidney failure Father   . Heart disease Son    History  Substance Use Topics  . Smoking status: Former Smoker -- 1.00 packs/day for 50 years    Types: Cigarettes    Quit date: 08/08/1993  . Smokeless tobacco: Never Used  . Alcohol Use: Yes     Comment: 10/15/11 "have a drink when I go out to dinner; don't go out often"   OB History    No data  available     Review of Systems  Constitutional: Negative for fever and chills.  HENT: Negative for sore throat.   Eyes: Negative for redness.  Respiratory: Negative for shortness of breath.   Cardiovascular: Negative for chest pain.  Gastrointestinal: Negative for vomiting and abdominal pain.  Genitourinary: Negative for flank pain.  Musculoskeletal: Negative for back pain and neck pain.  Skin: Negative for wound.  Neurological: Negative for weakness, numbness and headaches.  Hematological: Does not bruise/bleed easily.  Psychiatric/Behavioral: Negative for confusion.      Allergies  Cortisone; Minocin; Minocycline hcl; Neomycin-bacitracin zn-polymyx; Neosporin; Other; and Tetanus toxoids  Home Medications   Prior to Admission medications   Medication Sig Start Date End Date Taking? Authorizing Provider  acetaminophen (TYLENOL) 325 MG tablet Take 650 mg by mouth every 6 (six) hours as needed for mild pain.     Historical Provider, MD  acyclovir ointment (ZOVIRAX) 5 % Apply 1 application topically every 3 (three) hours as needed. For cold sores    Historical Provider, MD  albuterol (PROVENTIL HFA;VENTOLIN HFA) 108 (90 BASE) MCG/ACT inhaler Inhale 2 puffs into the lungs every 6 (six) hours as needed for wheezing or shortness of breath.    Historical Provider, MD  albuterol (PROVENTIL) (2.5 MG/3ML) 0.083% nebulizer solution Take 3 mLs (2.5 mg total) by nebulization every 2 (two) hours as needed for wheezing. 01/29/14   Shanker Kristeen Mans, MD  Alum & Mag Hydroxide-Simeth (MAGIC MOUTHWASH W/LIDOCAINE) SOLN Take 5 mLs by mouth 4 (four) times daily as needed for mouth pain. 01/29/14   Shanker Kristeen Mans, MD  amLODipine (NORVASC) 10 MG tablet Take 0.5 tablets (5 mg total) by mouth daily. 01/29/14   Shanker Kristeen Mans, MD  amoxicillin (AMOXIL) 500 MG capsule Take 2,000 mg by mouth once. Prior to dental procedure    Historical Provider, MD  augmented betamethasone dipropionate (DIPROLENE-AF) 0.05  % ointment Apply 1 application topically 2 (two) times daily.    Historical Provider, MD  benzonatate (TESSALON) 200 MG capsule Take 1 capsule (200 mg total) by mouth 3 (three) times daily as needed for cough. 01/29/14   Shanker Kristeen Mans, MD  betamethasone dipropionate (DIPROLENE) 0.05 % cream Apply 1 application topically 2 (two) times daily.    Historical Provider, MD  cetirizine (ZYRTEC) 10 MG tablet Take 10 mg by mouth daily.    Historical Provider, MD  chlorhexidine (PERIDEX) 0.12 % solution Use as directed 15 mLs in the mouth or throat. swish 15 mls for 30 sec then spit, twice daily    Historical Provider,  MD  chlorpheniramine-HYDROcodone (TUSSIONEX) 10-8 MG/5ML LQCR Take 5 mLs by mouth every 12 (twelve) hours as needed for cough. 01/29/14   Shanker Kristeen Mans, MD  Clocortolone Pivalate (CLODERM) 0.1 % cream Apply 1 application topically 2 (two) times daily. Applies to lesion on left cheek.    Historical Provider, MD  coal tar (NEUTROGENA T-GEL) 0.5 % shampoo Apply 1 application topically See admin instructions. She uses on Tuesday and Friday.    Historical Provider, MD  donepezil (ARICEPT) 10 MG tablet Take 10 mg by mouth at bedtime.     Historical Provider, MD  Emollient (CERAVE) CREA Apply 1 application topically daily. Applies to dry skin.    Historical Provider, MD  escitalopram (LEXAPRO) 20 MG tablet Take 20 mg by mouth every morning.     Historical Provider, MD  furosemide (LASIX) 40 MG tablet Take 1 tablet (40 mg total) by mouth every morning. 11/07/13   Eugenie Filler, MD  gabapentin (NEURONTIN) 100 MG capsule Take 200 mg by mouth at bedtime.     Historical Provider, MD  hydrOXYzine (ATARAX/VISTARIL) 25 MG tablet Take 25 mg by mouth at bedtime.    Historical Provider, MD  levofloxacin (LEVAQUIN) 500 MG tablet Take 1 tablet (500 mg total) by mouth daily. Take for 5 more days from 01/29/14 01/29/14   Jonetta Osgood, MD  levothyroxine (SYNTHROID, LEVOTHROID) 88 MCG tablet Take 88 mcg by  mouth daily before breakfast.     Historical Provider, MD  lidocaine (LIDODERM) 5 % Place 1 patch onto the skin daily. Remove & Discard patch within 12 hours or as directed by MD 01/29/14   Jonetta Osgood, MD  LORazepam (ATIVAN) 2 MG/ML concentrated solution Take 0.5 mLs (1 mg total) by mouth every 6 (six) hours as needed for anxiety or sedation. 01/29/14   Shanker Kristeen Mans, MD  Morphine Sulfate (MORPHINE CONCENTRATE) 10 mg / 0.5 ml concentrated solution Take 0.25 mLs (5 mg total) by mouth every 4 (four) hours as needed for severe pain, anxiety or shortness of breath. 01/29/14   Shanker Kristeen Mans, MD  Multiple Vitamins-Minerals (PRESERVISION AREDS) CAPS Take 1 capsule by mouth 2 (two) times daily with a meal.     Historical Provider, MD  mupirocin ointment (BACTROBAN) 2 % Place 1 application into the nose daily.    Historical Provider, MD  olopatadine (PATANOL) 0.1 % ophthalmic solution Place 1 drop into both eyes 2 (two) times daily as needed. For dry eyes    Historical Provider, MD  polyethylene glycol (MIRALAX / GLYCOLAX) packet Take 17 g by mouth every morning.     Historical Provider, MD  pramoxine-mineral oil-zinc (TUCKS) 1-12.5 % rectal ointment Place 1 application rectally every 2 (two) hours as needed for itching (itching).    Historical Provider, MD  pravastatin (PRAVACHOL) 40 MG tablet Take 1 tablet (40 mg total) by mouth every evening. 03/02/13   Lelon Perla, MD  Propylene Glycol (SYSTANE BALANCE) 0.6 % SOLN Place 1 drop into both eyes daily as needed (For dry or itchy eyes.). One drop both eye as needed    Historical Provider, MD  senna-docusate (SENOKOT S) 8.6-50 MG per tablet Take 1-2 tablets by mouth See admin instructions. She takes one tablet in the morning and two tablets at bedtime.    Historical Provider, MD  tiotropium (SPIRIVA) 18 MCG inhalation capsule Place 18 mcg into inhaler and inhale daily.    Historical Provider, MD   BP 137/57 mmHg  Pulse 73  Temp(Src)  99 F (37.2  C) (Oral)  Resp 16  Ht 5\' 4"  (1.626 m)  Wt 145 lb (65.772 kg)  BMI 24.88 kg/m2  SpO2 87% Physical Exam  Constitutional: She appears well-developed and well-nourished. No distress.  HENT:  Head: Atraumatic.  Eyes: Conjunctivae are normal. No scleral icterus.  Neck: Neck supple. No tracheal deviation present.  Cardiovascular: Normal rate, normal heart sounds and intact distal pulses.   Pulmonary/Chest: Effort normal and breath sounds normal. No respiratory distress.  Abdominal: Soft. Normal appearance and bowel sounds are normal. She exhibits no distension. There is no tenderness.  Genitourinary:  No cva tenderness  Musculoskeletal: She exhibits no edema.  Right hip w pain w rom. No gross shortening or rotation noted. Distal pulses palp.  No other focal bony tenderness on bil ext exam.   Neurological: She is alert.  Motor intact bil, stre 5/5. sens grossly intact.   Skin: Skin is warm and dry. No rash noted. She is not diaphoretic.  Psychiatric: She has a normal mood and affect.  Nursing note and vitals reviewed.   ED Course  Procedures (including critical care time) Labs Review  Results for orders placed or performed during the hospital encounter of 05/25/14  CBC  Result Value Ref Range   WBC 10.0 4.0 - 10.5 K/uL   RBC 3.92 3.87 - 5.11 MIL/uL   Hemoglobin 9.6 (L) 12.0 - 15.0 g/dL   HCT 31.6 (L) 36.0 - 46.0 %   MCV 80.6 78.0 - 100.0 fL   MCH 24.5 (L) 26.0 - 34.0 pg   MCHC 30.4 30.0 - 36.0 g/dL   RDW 16.8 (H) 11.5 - 15.5 %   Platelets 337 150 - 400 K/uL  Basic metabolic panel  Result Value Ref Range   Sodium 138 135 - 145 mmol/L   Potassium 2.9 (L) 3.5 - 5.1 mmol/L   Chloride 99 96 - 112 mmol/L   CO2 28 19 - 32 mmol/L   Glucose, Bld 116 (H) 70 - 99 mg/dL   BUN 14 6 - 23 mg/dL   Creatinine, Ser 1.46 (H) 0.50 - 1.10 mg/dL   Calcium 8.6 8.4 - 10.5 mg/dL   GFR calc non Af Amer 30 (L) >90 mL/min   GFR calc Af Amer 35 (L) >90 mL/min   Anion gap 11 5 - 15  Protime-INR   Result Value Ref Range   Prothrombin Time 14.9 11.6 - 15.2 seconds   INR 1.15 0.00 - 1.49  Type and screen  Result Value Ref Range   ABO/RH(D) A POS    Antibody Screen NEG    Sample Expiration 05/28/2014    Dg Chest Portable 1 View  05/25/2014   CLINICAL DATA:  Hip fracture.  Hip injury.  EXAM: PORTABLE CHEST - 1 VIEW  COMPARISON:  01/28/2014.  FINDINGS: Cardiopericardial silhouette upper limits of normal for projection. Aortic arch atherosclerosis. Fluid level is present in the inferior LEFT chest, compatible with moderate hiatal hernia and gastric fluid level. RIGHT basilar perihilar airspace opacity which may represent edema, aspiration, or pneumonia. This extends to the RIGHT costophrenic angle.  Monitoring leads project over the chest. No focal consolidation in the LEFT lung.  IMPRESSION: 1. RIGHT infrahilar and perihilar airspace disease may represent asymmetric pulmonary edema, aspiration or pneumonia. 2. Moderate hiatal hernia with fluid level in the LEFT chest.   Electronically Signed   By: Dereck Ligas M.D.   On: 05/25/2014 20:04   Dg Hip Unilat With Pelvis 2-3 Views Right  05/25/2014  CLINICAL DATA:  One week history of right hip pain without known trauma  EXAM: RIGHT HIP (WITH PELVIS) 2-3 VIEWS  COMPARISON:  Pelvis and left hip of March 29, 2014  FINDINGS: There is a mildly impacted fracture of the neck of the right femoral head. The femoral head remains appropriately related to the acetabulum. The intertrochanteric region of the right femur is intact. The bony pelvis is osteopenic. There is no lytic or blastic lesion nor acute fracture. There are degenerative changes of the lower lumbar spine. The observed portions of the sacrum are unremarkable. The left hip is unremarkable.  IMPRESSION: 1. There is an acute impacted slightly angulated subcapital fracture of the right hip. 2. There is no acute fracture of the bony pelvis. 3. These results will be called to the ordering clinician or  representative by the Radiologist Assistant, and communication documented in the PACS or zVision Dashboard.   Electronically Signed   By: David  Martinique   On: 05/25/2014 16:21       MDM   Iv ns. Morphine 2 mg iv.  Labs. Cxr.  Reviewed outpatient hip films, right subcapital hip fracture.  Orthopedist paged - states admit to med service.   Med service to admit.Reviewed nursing notes and prior charts for additional history.   cxr noted, pt afeb. Denies cough. No increased wob. Similar changes on right noted on prior cxrs and ct chest 2015.    Pain improved from prior.       Mirna Mires, MD 05/25/14 2031

## 2014-05-25 NOTE — ED Notes (Signed)
Family at bedside.  Patient is states theres been no change since pain med administration.

## 2014-05-25 NOTE — ED Notes (Signed)
Pt brought in via [facility]. Pt was seen at primary MD today, for right hip pain. Xray at MD office shows a right hip fracture. EMS didn't note any shortening or rotation of right leg. Pt is rating pain a 8/10. Pt is from [facility]. Pt is A/O. Pt denies any falls. Pt has a history of lung cancer. Pt is A/O x's 4. EMS vital signs 122/84, HR 76, RR 18, SPO2 98% on 2L. Pt is on home oxygen. Pt is a DNR and receiving hospice care.

## 2014-05-25 NOTE — H&P (Signed)
[facility] History and Physical  Nusaiba Guallpa XBL:[id number] DOB: [birth year] DOA: 05/25/2014  Referring physician: Lajean Saver, MD PCP: Kandice Hams, MD   Chief Complaint: Right hip fracture  HPI: [name] is a 81 y.o. female presents with a right hip fracture. Patient has a lung mass which is suspected to be malignant however she does not want any workup to be done for this. She apparently had been complaining about pain in the right hip for some time now. She states that she did not fall. She had no trauma to the hip. Patient is normally ambulatory and very active. She went to her PCP and was told to get further evaluation. On radiological studies it was found that she has a right hip fracture.   Review of Systems:  Constitutional:  No weight loss, night sweats, Fevers, chills, fatigue.  HEENT:  No headaches, +nasal congestion, post nasal drip,  Cardio-vascular:  No chest pain, Orthopnea, PND, swelling in lower extremities  GI:  No heartburn, indigestion, abdominal pain, nausea, vomiting, diarrhea  Resp:  No shortness of breath with exertion or at rest. No excess mucus, no productive cough ++coughing up of blood  Skin:  no rash or lesions GU:  no dysuria, change in color of urine, no urgency or frequency.  Musculoskeletal:  ++ hip pain. No back pain.  Psych:  No change in mood or affect. No depression or anxiety.  Past Medical History  Diagnosis Date  . Cholelithiases   . Hypothyroid   . Leg paresthesia   . Aortic valve stenosis   . Arthritis   . Hyperlipidemia   . Dementia   . Carotid artery occlusion   . Renal disorder   . Hypertension   . COPD (chronic obstructive pulmonary disease)   . SEIZURE DISORDER 11/05/2006  . GERD 11/05/2006  . CVA 02/07/2007  . Macular degeneration of both eyes   . Blind in both eyes   . Peripheral neuropathy   . Pneumonia     "many times"  . Internal hemorrhoid, bleeding   . Malaria     "as a child"  .  Skin cancer     "nose, face mostly"  . Chronic lower back pain     "when I go to the bathroom"  . Intestinal infection due to Clostridium difficile 09/2011  . Benign neoplasm of breast   . Gout, unspecified   . Anemia   . Anxiety   . Other dysfunctions of sleep stages or arousal from sleep   . Depression   . Chronic rhinitis   . Abscess of lung(513.0)     RUL  . Diaphragmatic hernia without mention of obstruction or gangrene   . Diverticulosis of colon (without mention of hemorrhage)   . Acute duodenal ulcer with perforation and obstruction 08/2011    perforation  . Hemorrhage of rectum and anus   . Abscess of intestine 09/2011    drainage  . Other psoriasis   . Lumbago   . Nonspecific elevation of levels of transaminase or lactic acid dehydrogenase (LDH)   . Solitary pulmonary nodule     RLL nodule 1 cm  . Transient ischemic attack (TIA), and cerebral infarction without residual deficits(V12.54)   . Long term (current) use of anticoagulants   . Sacroiliitis 08/27/2012  . Anemia, iron deficiency 10/16/2011  . Congestive heart failure   . Spondylosis of lumbosacral region 09/24/2012    L/S xray 08/2012: Mild to moderate osteoarthritis and degenerative spondylosis he diffusely. Anterior subluxation  of L4 relative to L5 at 5.7 cm. Mild to moderate levoconvex curvature the mid lumbar spine    . Dysrhythmia     hx atrial fib  . Arteritis     hx giant cell arteritis  . Aortic stenosis, severe 03/02/2013    Followed since 2004 with mild aortic valve stenosis and mild regurgitation. Echocardiograms 2006-2013 show  progression from mild to moderate aortic stenosis, mild insufficiency. Mild to moderate mitral valve regurgitation evident in 2013.   07/25/11: Worsening valve disease by echocardiogram, pt is not interested in invasive treatment. Symptoms appear stable for now. Continue to monitor      Past Surgical History  Procedure Laterality Date  . Vesicovaginal fistula closure w/ tah    .  Tonsillectomy    . Breast lumpectomy      bilaterally  . Cataract extraction w/ intraocular lens  implant, bilateral    . Skin cancer excision      "nose and face"  . Abdominal hysterectomy    . Hemorrhoid surgery  11/2008    banding   Social History:  reports that she quit smoking about 20 years ago. Her smoking use included Cigarettes. She has a 50 pack-year smoking history. She has never used smokeless tobacco. She reports that she drinks alcohol. She reports that she does not use illicit drugs.  Allergies  Allergen Reactions  . Cortisone Other (See Comments)    Rash   . Minocin [Minocycline Hcl] Other (See Comments)    Unknown  . Minocycline Hcl     unknown  . Neomycin-Bacitracin Zn-Polymyx     unknown  . Neosporin [Neomycin-Bacitracin Zn-Polymyx] Other (See Comments)    Unknown  . Other Other (See Comments)    Steroidal Neuromuscular Blockers  . Tetanus Toxoids Other (See Comments)    Unknown    Family History  Problem Relation Age of Onset  . Hyperlipidemia    . Hypertension    . Stroke    . Colon cancer Daughter   . Heart attack Father   . Kidney failure Father   . Heart disease Son      Prior to Admission medications   Medication Sig Start Date End Date Taking? Authorizing Provider  acetaminophen (TYLENOL) 325 MG tablet Take 650 mg by mouth every 6 (six) hours as needed for mild pain.    Yes Historical Provider, MD  acyclovir ointment (ZOVIRAX) 5 % Apply 1 application topically every 3 (three) hours as needed. For cold sores   Yes Historical Provider, MD  albuterol (PROVENTIL HFA;VENTOLIN HFA) 108 (90 BASE) MCG/ACT inhaler Inhale 2 puffs into the lungs every 6 (six) hours as needed for wheezing or shortness of breath.   Yes Historical Provider, MD  albuterol (PROVENTIL) (2.5 MG/3ML) 0.083% nebulizer solution Take 3 mLs (2.5 mg total) by nebulization every 2 (two) hours as needed for wheezing. 01/29/14  Yes Shanker Kristeen Mans, MD  Alum & Mag Hydroxide-Simeth (MAGIC  MOUTHWASH) SOLN Take 10 mLs by mouth 3 (three) times daily.   Yes Historical Provider, MD  amLODipine (NORVASC) 10 MG tablet Take 0.5 tablets (5 mg total) by mouth daily. 01/29/14  Yes Shanker Kristeen Mans, MD  amoxicillin (AMOXIL) 500 MG capsule Take 2,000 mg by mouth once. Prior to dental procedure   Yes Historical Provider, MD  benzonatate (TESSALON) 200 MG capsule Take 1 capsule (200 mg total) by mouth 3 (three) times daily as needed for cough. 01/29/14  Yes Shanker Kristeen Mans, MD  chlorhexidine (PERIDEX) 0.12 % solution  Use as directed 15 mLs in the mouth or throat. swish 15 mls for 30 sec then spit, twice daily   Yes Historical Provider, MD  chlorpheniramine-HYDROcodone (TUSSIONEX) 10-8 MG/5ML LQCR Take 5 mLs by mouth every 12 (twelve) hours as needed for cough. 01/29/14  Yes Shanker Kristeen Mans, MD  donepezil (ARICEPT) 10 MG tablet Take 10 mg by mouth at bedtime.    Yes Historical Provider, MD  escitalopram (LEXAPRO) 20 MG tablet Take 20 mg by mouth every morning.    Yes Historical Provider, MD  fluticasone (FLONASE) 50 MCG/ACT nasal spray Place 2 sprays into both nostrils daily.   Yes Historical Provider, MD  furosemide (LASIX) 40 MG tablet Take 1 tablet (40 mg total) by mouth every morning. 11/07/13  Yes Eugenie Filler, MD  gabapentin (NEURONTIN) 100 MG capsule Take 200 mg by mouth at bedtime.    Yes Historical Provider, MD  hydrOXYzine (ATARAX/VISTARIL) 25 MG tablet Take 25 mg by mouth at bedtime.   Yes Historical Provider, MD  levothyroxine (SYNTHROID, LEVOTHROID) 88 MCG tablet Take 88 mcg by mouth daily before breakfast.    Yes Historical Provider, MD  lidocaine (LIDODERM) 5 % Place 1 patch onto the skin daily. Remove & Discard patch within 12 hours or as directed by MD 01/29/14  Yes Shanker Kristeen Mans, MD  LORazepam (ATIVAN) 2 MG/ML concentrated solution Take 0.5 mLs (1 mg total) by mouth every 6 (six) hours as needed for anxiety or sedation. 01/29/14  Yes Shanker Kristeen Mans, MD  Morphine Sulfate  (MORPHINE CONCENTRATE) 10 mg / 0.5 ml concentrated solution Take 0.25 mLs (5 mg total) by mouth every 4 (four) hours as needed for severe pain, anxiety or shortness of breath. 01/29/14  Yes Shanker Kristeen Mans, MD  Multiple Vitamins-Minerals (PRESERVISION AREDS) CAPS Take 1 capsule by mouth 2 (two) times daily with a meal.    Yes Historical Provider, MD  olopatadine (PATANOL) 0.1 % ophthalmic solution Place 1 drop into both eyes 2 (two) times daily as needed. For dry eyes   Yes Historical Provider, MD  polyethylene glycol (MIRALAX / GLYCOLAX) packet Take 17 g by mouth every morning.    Yes Historical Provider, MD  pramoxine-mineral oil-zinc (TUCKS) 1-12.5 % rectal ointment Place 1 application rectally every 2 (two) hours as needed for itching (itching).   Yes Historical Provider, MD  pravastatin (PRAVACHOL) 40 MG tablet Take 1 tablet (40 mg total) by mouth every evening. 03/02/13  Yes Lelon Perla, MD  senna-docusate (SENOKOT S) 8.6-50 MG per tablet Take 1-2 tablets by mouth See admin instructions. She takes one tablet in the morning and two tablets at bedtime.   Yes Historical Provider, MD  tiotropium (SPIRIVA) 18 MCG inhalation capsule Place 18 mcg into inhaler and inhale daily.   Yes Historical Provider, MD  Alum & Mag Hydroxide-Simeth (MAGIC MOUTHWASH W/LIDOCAINE) SOLN Take 5 mLs by mouth 4 (four) times daily as needed for mouth pain. 01/29/14   Shanker Kristeen Mans, MD  augmented betamethasone dipropionate (DIPROLENE-AF) 0.05 % ointment Apply 1 application topically 2 (two) times daily.    Historical Provider, MD  betamethasone dipropionate (DIPROLENE) 0.05 % cream Apply 1 application topically 2 (two) times daily.    Historical Provider, MD  cetirizine (ZYRTEC) 10 MG tablet Take 10 mg by mouth daily.    Historical Provider, MD  Clocortolone Pivalate (CLODERM) 0.1 % cream Apply 1 application topically 2 (two) times daily. Applies to lesion on left cheek.    Historical Provider, MD  coal  tar (NEUTROGENA  T-GEL) 0.5 % shampoo Apply 1 application topically See admin instructions. She uses on Tuesday and Friday.    Historical Provider, MD  Emollient (CERAVE) CREA Apply 1 application topically daily. Applies to dry skin.    Historical Provider, MD  levofloxacin (LEVAQUIN) 500 MG tablet Take 1 tablet (500 mg total) by mouth daily. Take for 5 more days from 01/29/14 01/29/14   Jonetta Osgood, MD  mupirocin ointment (BACTROBAN) 2 % Place 1 application into the nose daily.    Historical Provider, MD  Propylene Glycol (SYSTANE BALANCE) 0.6 % SOLN Place 1 drop into both eyes daily as needed (For dry or itchy eyes.). One drop both eye as needed    Historical Provider, MD   Physical Exam: Filed Vitals:   05/25/14 1941 05/25/14 2000 05/25/14 2027 05/25/14 2052  BP:  134/101  139/83  Pulse: 62 58 64 65  Temp:      TempSrc:      Resp: 23 24 20 22   Height:      Weight:      SpO2: 93% 91% 99% 95%    Wt Readings from Last 3 Encounters:  05/25/14 65.772 kg (145 lb)  01/25/14 66.679 kg (147 lb)  11/19/13 66.815 kg (147 lb 4.8 oz)    General:  Appears calm and comfortable Eyes: PERRL, normal lids, irises & conjunctiva ENT: grossly normal hearing, lips & tongue Neck: no LAD, masses or thyromegaly Cardiovascular: IRR, no m/r/g. No LE edema Respiratory: CTA bilaterally, no w/r/r. Normal respiratory effort. Abdomen: soft, ntnd Skin: no rash or induration seen on limited exam Musculoskeletal: Right LE externally rotated and shortened Psychiatric: grossly normal mood and affect, speech fluent and appropriate Neurologic: grossly non-focal.          Labs on Admission:  Basic Metabolic Panel:  Recent Labs Lab 05/25/14 1900  NA 138  K 2.9*  CL 99  CO2 28  GLUCOSE 116*  BUN 14  CREATININE 1.46*  CALCIUM 8.6   Liver Function Tests: No results for input(s): AST, ALT, ALKPHOS, BILITOT, PROT, ALBUMIN in the last 168 hours. No results for input(s): LIPASE, AMYLASE in the last 168 hours. No  results for input(s): AMMONIA in the last 168 hours. CBC:  Recent Labs Lab 05/25/14 1900  WBC 10.0  HGB 9.6*  HCT 31.6*  MCV 80.6  PLT 337   Cardiac Enzymes: No results for input(s): CKTOTAL, CKMB, CKMBINDEX, TROPONINI in the last 168 hours.  BNP (last 3 results) No results for input(s): BNP in the last 8760 hours.  ProBNP (last 3 results) No results for input(s): PROBNP in the last 8760 hours.  CBG: No results for input(s): GLUCAP in the last 168 hours.  Radiological Exams on Admission: Dg Chest Portable 1 View  05/25/2014   CLINICAL DATA:  Hip fracture.  Hip injury.  EXAM: PORTABLE CHEST - 1 VIEW  COMPARISON:  01/28/2014.  FINDINGS: Cardiopericardial silhouette upper limits of normal for projection. Aortic arch atherosclerosis. Fluid level is present in the inferior LEFT chest, compatible with moderate hiatal hernia and gastric fluid level. RIGHT basilar perihilar airspace opacity which may represent edema, aspiration, or pneumonia. This extends to the RIGHT costophrenic angle.  Monitoring leads project over the chest. No focal consolidation in the LEFT lung.  IMPRESSION: 1. RIGHT infrahilar and perihilar airspace disease may represent asymmetric pulmonary edema, aspiration or pneumonia. 2. Moderate hiatal hernia with fluid level in the LEFT chest.   Electronically Signed   By: Dereck Ligas  M.D.   On: 05/25/2014 20:04   Dg Hip Unilat With Pelvis 2-3 Views Right  05/25/2014   CLINICAL DATA:  One week history of right hip pain without known trauma  EXAM: RIGHT HIP (WITH PELVIS) 2-3 VIEWS  COMPARISON:  Pelvis and left hip of March 29, 2014  FINDINGS: There is a mildly impacted fracture of the neck of the right femoral head. The femoral head remains appropriately related to the acetabulum. The intertrochanteric region of the right femur is intact. The bony pelvis is osteopenic. There is no lytic or blastic lesion nor acute fracture. There are degenerative changes of the lower lumbar  spine. The observed portions of the sacrum are unremarkable. The left hip is unremarkable.  IMPRESSION: 1. There is an acute impacted slightly angulated subcapital fracture of the right hip. 2. There is no acute fracture of the bony pelvis. 3. These results will be called to the ordering clinician or representative by the Radiologist Assistant, and communication documented in the PACS or zVision Dashboard.   Electronically Signed   By: David  Martinique   On: 05/25/2014 16:21      Assessment/Plan Principal Problem:   Subcapital fracture of right hip Active Problems:   Essential hypertension   Atrial fibrillation   Lung mass   COLD (chronic obstructive lung disease)   Hip fracture   1. Subcapital Right Hip Fracture -orthopedics to see patient -she has been very active may actually benefit from hip replacement  2. Hypertension -will monitor pressures -will continue with home medications  3. Atrial Fibrillation -monitor heart rate -right now controlled  4. Lung Mass -does not want a workup done -may be metastatic based on the hip fracture  5. COPD -continue with inhalers  6. Hypokalemia -will replace as needed -repeat labs  7. Elevated creatinine -will hydrate -repeat labs  Code Status: DNR per Patient discussion (must indicate code status--if unknown or must be presumed, indicate so) DVT Prophylaxis:SCDs Heparin Family Communication: Daughter (indicate person spoken with, if applicable, with phone number if by telephone) Disposition Plan: SNF (indicate anticipated LOS)  Time spent: 50min  Hallie Ertl A [facility] Pager [phone]

## 2014-05-26 ENCOUNTER — Telehealth: Payer: Self-pay | Admitting: Pulmonary Disease

## 2014-05-26 ENCOUNTER — Inpatient Hospital Stay (HOSPITAL_COMMUNITY)

## 2014-05-26 ENCOUNTER — Inpatient Hospital Stay (HOSPITAL_COMMUNITY): Admitting: Anesthesiology

## 2014-05-26 ENCOUNTER — Encounter (HOSPITAL_COMMUNITY)
Admission: EM | Disposition: A | Discharge status: Skilled Nursing Facility | Payer: Self-pay | Source: Home / Self Care | Attending: Internal Medicine

## 2014-05-26 ENCOUNTER — Ambulatory Visit: Payer: Medicare Other | Admitting: Pulmonary Disease

## 2014-05-26 DIAGNOSIS — E43 Unspecified severe protein-calorie malnutrition: Secondary | ICD-10-CM | POA: Insufficient documentation

## 2014-05-26 DIAGNOSIS — I482 Chronic atrial fibrillation: Secondary | ICD-10-CM

## 2014-05-26 HISTORY — PX: HIP SURGERY: SHX245

## 2014-05-26 HISTORY — PX: HIP ARTHROPLASTY: SHX981

## 2014-05-26 LAB — BASIC METABOLIC PANEL
ANION GAP: 8 (ref 5–15)
ANION GAP: 4 — AB (ref 5–15)
BUN: 13 mg/dL (ref 6–23)
BUN: 12 mg/dL (ref 6–23)
CALCIUM: 8.6 mg/dL (ref 8.4–10.5)
CHLORIDE: 102 mmol/L (ref 96–112)
CO2: 29 mmol/L (ref 19–32)
CO2: 32 mmol/L (ref 19–32)
CREATININE: 1.25 mg/dL — AB (ref 0.50–1.10)
Calcium: 8.4 mg/dL (ref 8.4–10.5)
Chloride: 103 mmol/L (ref 96–112)
Creatinine, Ser: 1.21 mg/dL — ABNORMAL HIGH (ref 0.50–1.10)
GFR calc non Af Amer: 38 mL/min — ABNORMAL LOW (ref >90)
GFR calc non Af Amer: 36 mL/min — ABNORMAL LOW (ref >90)
GFR, EST AFRICAN AMERICAN: 44 mL/min — AB (ref >90)
GFR, EST AFRICAN AMERICAN: 42 mL/min — AB (ref >90)
Glucose, Bld: 94 mg/dL (ref 70–99)
Glucose, Bld: 105 mg/dL — ABNORMAL HIGH (ref 70–99)
Potassium: 3.2 mmol/L — ABNORMAL LOW (ref 3.5–5.1)
Potassium: 3 mmol/L — ABNORMAL LOW (ref 3.5–5.1)
SODIUM: 139 mmol/L (ref 135–145)
Sodium: 139 mmol/L (ref 135–145)

## 2014-05-26 LAB — MAGNESIUM: MAGNESIUM: 2.1 mg/dL (ref 1.5–2.5)

## 2014-05-26 LAB — CBC
HCT: 30 % — ABNORMAL LOW (ref 36.0–46.0)
Hemoglobin: 9.1 g/dL — ABNORMAL LOW (ref 12.0–15.0)
MCH: 24.7 pg — AB (ref 26.0–34.0)
MCHC: 30.3 g/dL (ref 30.0–36.0)
MCV: 81.3 fL (ref 78.0–100.0)
PLATELETS: 302 10*3/uL (ref 150–400)
RBC: 3.69 MIL/uL — ABNORMAL LOW (ref 3.87–5.11)
RDW: 17 % — AB (ref 11.5–15.5)
WBC: 7.9 10*3/uL (ref 4.0–10.5)

## 2014-05-26 LAB — SURGICAL PCR SCREEN
MRSA, PCR: NEGATIVE
Staphylococcus aureus: NEGATIVE

## 2014-05-26 SURGERY — HEMIARTHROPLASTY, HIP, DIRECT ANTERIOR APPROACH, FOR FRACTURE
Anesthesia: General | Site: Hip | Laterality: Right

## 2014-05-26 MED ORDER — PROPOFOL 10 MG/ML IV BOLUS
INTRAVENOUS | Status: AC
  Filled 2014-05-26: qty 20

## 2014-05-26 MED ORDER — CEFAZOLIN SODIUM-DEXTROSE 2-3 GM-% IV SOLR
INTRAVENOUS | Status: AC
  Administered 2014-05-26: 2 g via INTRAVENOUS
  Filled 2014-05-26: qty 50

## 2014-05-26 MED ORDER — PROPOFOL 10 MG/ML IV BOLUS
INTRAVENOUS | Status: DC | PRN
  Administered 2014-05-26: 90 mg via INTRAVENOUS

## 2014-05-26 MED ORDER — BOOST / RESOURCE BREEZE PO LIQD
1.0000 | Freq: Three times a day (TID) | ORAL | Status: DC
  Administered 2014-05-27 – 2014-05-28 (×3): 1 via ORAL

## 2014-05-26 MED ORDER — LIDOCAINE HCL 4 % MT SOLN
OROMUCOSAL | Status: DC | PRN
  Administered 2014-05-26: 4 mL via TOPICAL

## 2014-05-26 MED ORDER — FENTANYL CITRATE 0.05 MG/ML IJ SOLN
25.0000 ug | INTRAMUSCULAR | Status: DC | PRN
  Administered 2014-05-26 (×2): 50 ug via INTRAVENOUS

## 2014-05-26 MED ORDER — ONDANSETRON HCL 4 MG PO TABS: 4.0000 mg | ORAL_TABLET | Freq: Four times a day (QID) | ORAL | Status: DC | PRN

## 2014-05-26 MED ORDER — LACTATED RINGERS IV SOLN
INTRAVENOUS | Status: DC
  Administered 2014-05-26: 16:00:00 via INTRAVENOUS

## 2014-05-26 MED ORDER — ONDANSETRON HCL 4 MG/2ML IJ SOLN
INTRAMUSCULAR | Status: DC | PRN
  Administered 2014-05-26: 4 mg via INTRAVENOUS

## 2014-05-26 MED ORDER — ENOXAPARIN SODIUM 40 MG/0.4ML ~~LOC~~ SOLN
40.0000 mg | SUBCUTANEOUS | Status: DC
  Administered 2014-05-27 – 2014-05-28 (×2): 40 mg via SUBCUTANEOUS
  Filled 2014-05-26 (×2): qty 0.4

## 2014-05-26 MED ORDER — CEFAZOLIN SODIUM-DEXTROSE 2-3 GM-% IV SOLR
2.0000 g | Freq: Four times a day (QID) | INTRAVENOUS | Status: AC
  Administered 2014-05-26 – 2014-05-27 (×2): 2 g via INTRAVENOUS
  Filled 2014-05-26 (×2): qty 50

## 2014-05-26 MED ORDER — ONDANSETRON HCL 4 MG/2ML IJ SOLN: 4.0000 mg | Freq: Four times a day (QID) | INTRAMUSCULAR | Status: DC | PRN

## 2014-05-26 MED ORDER — CETYLPYRIDINIUM CHLORIDE 0.05 % MT LIQD
7.0000 mL | Freq: Two times a day (BID) | OROMUCOSAL | Status: DC
  Administered 2014-05-26 – 2014-05-28 (×3): 7 mL via OROMUCOSAL

## 2014-05-26 MED ORDER — POLYETHYLENE GLYCOL 3350 17 G PO PACK
17.0000 g | PACK | Freq: Two times a day (BID) | ORAL | Status: DC
  Administered 2014-05-26 – 2014-05-28 (×3): 17 g via ORAL
  Filled 2014-05-26 (×4): qty 1

## 2014-05-26 MED ORDER — LIDOCAINE HCL (CARDIAC) 20 MG/ML IV SOLN
INTRAVENOUS | Status: DC | PRN
  Administered 2014-05-26: 60 mg via INTRAVENOUS

## 2014-05-26 MED ORDER — MAGNESIUM CITRATE PO SOLN: 1.0000 | Freq: Once | ORAL | Status: AC | PRN

## 2014-05-26 MED ORDER — ALUM & MAG HYDROXIDE-SIMETH 200-200-20 MG/5ML PO SUSP
30.0000 mL | ORAL | Status: DC | PRN
Start: 2014-05-26 — End: 2014-05-28

## 2014-05-26 MED ORDER — FENTANYL CITRATE 0.05 MG/ML IJ SOLN
INTRAMUSCULAR | Status: AC
  Filled 2014-05-26: qty 2

## 2014-05-26 MED ORDER — FENTANYL CITRATE 0.05 MG/ML IJ SOLN
INTRAMUSCULAR | Status: AC
  Filled 2014-05-26: qty 5

## 2014-05-26 MED ORDER — SODIUM CHLORIDE 0.9 % IR SOLN
Status: DC | PRN
  Administered 2014-05-26: 1000 mL

## 2014-05-26 MED ORDER — METOCLOPRAMIDE HCL 5 MG/ML IJ SOLN: 5.0000 mg | Freq: Three times a day (TID) | INTRAMUSCULAR | Status: DC | PRN

## 2014-05-26 MED ORDER — PHENOL 1.4 % MT LIQD: 1.0000 | OROMUCOSAL | Status: DC | PRN

## 2014-05-26 MED ORDER — ASPIRIN EC 325 MG PO TBEC: 325.0000 mg | DELAYED_RELEASE_TABLET | Freq: Two times a day (BID) | ORAL | Status: DC

## 2014-05-26 MED ORDER — LIDOCAINE HCL (CARDIAC) 20 MG/ML IV SOLN
INTRAVENOUS | Status: AC
  Filled 2014-05-26: qty 5

## 2014-05-26 MED ORDER — FERROUS SULFATE 325 (65 FE) MG PO TABS
325.0000 mg | ORAL_TABLET | Freq: Three times a day (TID) | ORAL | Status: DC
  Administered 2014-05-27 – 2014-05-28 (×5): 325 mg via ORAL
  Filled 2014-05-26 (×5): qty 1

## 2014-05-26 MED ORDER — SUCCINYLCHOLINE CHLORIDE 20 MG/ML IJ SOLN
INTRAMUSCULAR | Status: DC | PRN
  Administered 2014-05-26: 120 mg via INTRAVENOUS

## 2014-05-26 MED ORDER — ONDANSETRON HCL 4 MG/2ML IJ SOLN
4.0000 mg | Freq: Once | INTRAMUSCULAR | Status: DC | PRN
Start: 2014-05-26 — End: 2014-05-26

## 2014-05-26 MED ORDER — METOCLOPRAMIDE HCL 10 MG PO TABS: 5.0000 mg | ORAL_TABLET | Freq: Three times a day (TID) | ORAL | Status: DC | PRN

## 2014-05-26 MED ORDER — ONDANSETRON HCL 4 MG/2ML IJ SOLN
INTRAMUSCULAR | Status: AC
  Filled 2014-05-26: qty 2

## 2014-05-26 MED ORDER — POTASSIUM CHLORIDE CRYS ER 20 MEQ PO TBCR
20.0000 meq | EXTENDED_RELEASE_TABLET | Freq: Two times a day (BID) | ORAL | Status: DC
  Administered 2014-05-26 – 2014-05-28 (×5): 20 meq via ORAL
  Filled 2014-05-26 (×5): qty 1

## 2014-05-26 MED ORDER — LACTATED RINGERS IV SOLN
INTRAVENOUS | Status: DC | PRN
  Administered 2014-05-26: 17:00:00 via INTRAVENOUS

## 2014-05-26 MED ORDER — HYDROCODONE-ACETAMINOPHEN 5-325 MG PO TABS: 1.0000 | ORAL_TABLET | ORAL | Status: AC | PRN

## 2014-05-26 MED ORDER — MENTHOL 3 MG MT LOZG: 1.0000 | LOZENGE | OROMUCOSAL | Status: DC | PRN

## 2014-05-26 MED ORDER — FENTANYL CITRATE 0.05 MG/ML IJ SOLN: 25.0000 ug | INTRAMUSCULAR | Status: DC | PRN

## 2014-05-26 MED ORDER — HYDROCODONE-ACETAMINOPHEN 5-325 MG PO TABS
1.0000 | ORAL_TABLET | ORAL | Status: DC | PRN
  Administered 2014-05-26: 1 via ORAL
  Administered 2014-05-27 – 2014-05-28 (×4): 2 via ORAL
  Filled 2014-05-26: qty 2
  Filled 2014-05-26: qty 1
  Filled 2014-05-26 (×3): qty 2

## 2014-05-26 MED ORDER — FENTANYL CITRATE 0.05 MG/ML IJ SOLN
INTRAMUSCULAR | Status: DC | PRN
  Administered 2014-05-26 (×5): 50 ug via INTRAVENOUS

## 2014-05-26 SURGICAL SUPPLY — 56 items
ADH SKN CLS APL DERMABOND .7 (GAUZE/BANDAGES/DRESSINGS) ×1
BLADE SAW SGTL 18X1.27X75 (BLADE) ×2 IMPLANT
BLADE SAW SGTL 18X1.27X75MM (BLADE) ×1
CAPT HIP HEMI 1 ×3 IMPLANT
COVER SURGICAL LIGHT HANDLE (MISCELLANEOUS) ×3 IMPLANT
DERMABOND ADVANCED (GAUZE/BANDAGES/DRESSINGS) ×2
DERMABOND ADVANCED .7 DNX12 (GAUZE/BANDAGES/DRESSINGS) ×1 IMPLANT
DRAPE IMP U-DRAPE 54X76 (DRAPES) ×3 IMPLANT
DRAPE INCISE IOBAN 85X60 (DRAPES) ×3 IMPLANT
DRAPE ORTHO SPLIT 77X108 STRL (DRAPES) ×6
DRAPE SURG ORHT 6 SPLT 77X108 (DRAPES) ×2 IMPLANT
DRAPE U-SHAPE 47X51 STRL (DRAPES) ×3 IMPLANT
DRSG AQUACEL AG ADV 3.5X10 (GAUZE/BANDAGES/DRESSINGS) ×3 IMPLANT
DURAPREP 26ML APPLICATOR (WOUND CARE) ×3 IMPLANT
ELECT BLADE 4.0 EZ CLEAN MEGAD (MISCELLANEOUS) ×3
ELECT REM PT RETURN 9FT ADLT (ELECTROSURGICAL) ×3
ELECTRODE BLDE 4.0 EZ CLN MEGD (MISCELLANEOUS) ×1 IMPLANT
ELECTRODE REM PT RTRN 9FT ADLT (ELECTROSURGICAL) ×1 IMPLANT
EVACUATOR 1/8 PVC DRAIN (DRAIN) IMPLANT
FACESHIELD WRAPAROUND (MASK) ×6 IMPLANT
GLOVE BIOGEL PI IND STRL 6.5 (GLOVE) ×2 IMPLANT
GLOVE BIOGEL PI IND STRL 7.5 (GLOVE) ×1 IMPLANT
GLOVE BIOGEL PI IND STRL 8 (GLOVE) ×2 IMPLANT
GLOVE BIOGEL PI INDICATOR 6.5 (GLOVE) ×4
GLOVE BIOGEL PI INDICATOR 7.5 (GLOVE) ×2
GLOVE BIOGEL PI INDICATOR 8 (GLOVE) ×4
GLOVE ECLIPSE 8.0 STRL XLNG CF (GLOVE) ×3 IMPLANT
GLOVE ORTHO TXT STRL SZ7.5 (GLOVE) ×3 IMPLANT
GLOVE SURG ORTHO 8.0 STRL STRW (GLOVE) ×3 IMPLANT
GLOVE SURG SS PI 6.5 STRL IVOR (GLOVE) ×3 IMPLANT
GOWN STRL REIN 3XL XLG LVL4 (GOWN DISPOSABLE) IMPLANT
GOWN STRL REUS W/ TWL LRG LVL3 (GOWN DISPOSABLE) ×2 IMPLANT
GOWN STRL REUS W/TWL 2XL LVL3 (GOWN DISPOSABLE) ×3 IMPLANT
GOWN STRL REUS W/TWL LRG LVL3 (GOWN DISPOSABLE) ×6
HANDPIECE INTERPULSE COAX TIP (DISPOSABLE)
IMMOBILIZER KNEE 22 UNIV (SOFTGOODS) IMPLANT
KIT BASIN OR (CUSTOM PROCEDURE TRAY) ×3 IMPLANT
KIT ROOM TURNOVER OR (KITS) ×3 IMPLANT
LIQUID BAND (GAUZE/BANDAGES/DRESSINGS) ×3 IMPLANT
MANIFOLD NEPTUNE II (INSTRUMENTS) ×3 IMPLANT
NS IRRIG 1000ML POUR BTL (IV SOLUTION) ×3 IMPLANT
PACK TOTAL JOINT (CUSTOM PROCEDURE TRAY) ×3 IMPLANT
PACK UNIVERSAL I (CUSTOM PROCEDURE TRAY) ×3 IMPLANT
PAD ARMBOARD 7.5X6 YLW CONV (MISCELLANEOUS) ×6 IMPLANT
SET HNDPC FAN SPRY TIP SCT (DISPOSABLE) IMPLANT
SPONGE LAP 4X18 X RAY DECT (DISPOSABLE) ×6 IMPLANT
SUT MNCRL AB 4-0 PS2 18 (SUTURE) ×3 IMPLANT
SUT VIC AB 1 CT1 27 (SUTURE) ×6
SUT VIC AB 1 CT1 27XBRD ANBCTR (SUTURE) ×2 IMPLANT
SUT VIC AB 2-0 CT1 27 (SUTURE) ×3
SUT VIC AB 2-0 CT1 TAPERPNT 27 (SUTURE) ×1 IMPLANT
SUT VLOC 180 0 24IN GS25 (SUTURE) ×3 IMPLANT
TOWEL OR 17X24 6PK STRL BLUE (TOWEL DISPOSABLE) ×3 IMPLANT
TOWEL OR 17X26 10 PK STRL BLUE (TOWEL DISPOSABLE) ×3 IMPLANT
TRAY FOLEY CATH 14FR (SET/KITS/TRAYS/PACK) IMPLANT
WATER STERILE IRR 1000ML POUR (IV SOLUTION) IMPLANT

## 2014-05-26 NOTE — Telephone Encounter (Signed)
Spoke with [name]. States that pt has broken her hip and is in the hospital. She was supposed to have an appointment today with [facility] and do a CXR. Wants someone from our office to come see her. Vicenta Dunning that she could let her charge nurse know that she is a pt of ours and they can see if someone could come see her. She verbalized understanding.

## 2014-05-26 NOTE — H&P (View-Only) (Signed)
Reason for Consult:   Right hip fracture Referring Physician: ED Physician  [name] is an 81 y.o. Karen.  HPI: [name] is a 81 y.o. Karen presents with a right hip fracture. Patient has a lung mass which is suspected to be malignant however she does not want any workup to be done for this. She apparently had been complaining about pain in the right hip for some time now. She states that she did not fall. She had no trauma to the hip. Patient is normally ambulatory and very active. She went to her PCP and was told to get further evaluation. On radiological studies it was found that she has a right hip fracture.  She was brought to the ER and Ortho was consulted.  Discussion was had regarding surgery.  Risks, benefits and expectations were discussed with the patient.  Risks including but not limited to the risk of anesthesia, blood clots, nerve damage, blood vessel damage, failure of the prosthesis, infection and up to and including death.  Patient understand the risks, benefits and expectations and wishes to proceed with surgery.    Past Medical History  Diagnosis Date  . Cholelithiases   . Hypothyroid   . Leg paresthesia   . Aortic valve stenosis   . Arthritis   . Hyperlipidemia   . Dementia   . Carotid artery occlusion   . Renal disorder   . Hypertension   . COPD (chronic obstructive pulmonary disease)   . SEIZURE DISORDER 11/05/2006  . GERD 11/05/2006  . CVA 02/07/2007  . Macular degeneration of both eyes   . Blind in both eyes   . Peripheral neuropathy   . Pneumonia     "many times"  . Internal hemorrhoid, bleeding   . Malaria     "as a child"  . Skin cancer     "nose, face mostly"  . Chronic lower back pain     "when I go to the bathroom"  . Intestinal infection due to Clostridium difficile 09/2011  . Benign neoplasm of breast   . Gout, unspecified   . Anemia   . Anxiety   . Other dysfunctions of sleep stages or arousal from sleep   . Depression    . Chronic rhinitis   . Abscess of lung(513.0)     RUL  . Diaphragmatic hernia without mention of obstruction or gangrene   . Diverticulosis of colon (without mention of hemorrhage)   . Acute duodenal ulcer with perforation and obstruction 08/2011    perforation  . Hemorrhage of rectum and anus   . Abscess of intestine 09/2011    drainage  . Other psoriasis   . Lumbago   . Nonspecific elevation of levels of transaminase or lactic acid dehydrogenase (LDH)   . Solitary pulmonary nodule     RLL nodule 1 cm  . Transient ischemic attack (TIA), and cerebral infarction without residual deficits(V12.54)   . Long term (current) use of anticoagulants   . Sacroiliitis 08/27/2012  . Anemia, iron deficiency 10/16/2011  . Congestive heart failure   . Spondylosis of lumbosacral region 09/24/2012    L/S xray 08/2012: Mild to moderate osteoarthritis and degenerative spondylosis he diffusely. Anterior subluxation of L4 relative to L5 at 5.7 cm. Mild to moderate levoconvex curvature the mid lumbar spine    . Dysrhythmia     hx atrial fib  . Arteritis     hx giant cell arteritis  . Aortic stenosis, severe 03/02/2013    Followed since  2004 with mild aortic valve stenosis and mild regurgitation. Echocardiograms 2006-2013 show  progression from mild to moderate aortic stenosis, mild insufficiency. Mild to moderate mitral valve regurgitation evident in 2013.   07/25/11: Worsening valve disease by echocardiogram, pt is not interested in invasive treatment. Symptoms appear stable for now. Continue to monitor       Past Surgical History  Procedure Laterality Date  . Vesicovaginal fistula closure w/ tah    . Tonsillectomy    . Breast lumpectomy      bilaterally  . Cataract extraction w/ intraocular lens  implant, bilateral    . Skin cancer excision      "nose and face"  . Abdominal hysterectomy    . Hemorrhoid surgery  11/2008    banding    Family History  Problem Relation Age of Onset  . Hyperlipidemia    .  Hypertension    . Stroke    . Colon cancer Daughter   . Heart attack Father   . Kidney failure Father   . Heart disease Son     Social History:  reports that she quit smoking about 20 years ago. Her smoking use included Cigarettes. She has a 50 pack-year smoking history. She has never used smokeless tobacco. She reports that she drinks alcohol. She reports that she does not use illicit drugs.  Allergies:  Allergies  Allergen Reactions  . Cortisone Other (See Comments)    Rash   . Minocin [Minocycline Hcl] Other (See Comments)    Unknown  . Minocycline Hcl     unknown  . Neomycin-Bacitracin Zn-Polymyx     unknown  . Neosporin [Neomycin-Bacitracin Zn-Polymyx] Other (See Comments)    Unknown  . Other Other (See Comments)    Steroidal Neuromuscular Blockers  . Tetanus Toxoids Other (See Comments)    Unknown     Results for orders placed or performed during the hospital encounter of 05/25/14 (from the past 48 hour(s))  CBC     Status: Abnormal   Collection Time: 05/25/14  7:00 PM  Result Value Ref Range   WBC 10.0 4.0 - 10.5 K/uL   RBC 3.92 3.87 - 5.11 MIL/uL   Hemoglobin 9.6 (L) 12.0 - 15.0 g/dL   HCT 31.6 (L) 36.0 - 46.0 %   MCV 80.6 78.0 - 100.0 fL   MCH 24.5 (L) 26.0 - 34.0 pg   MCHC 30.4 30.0 - 36.0 g/dL   RDW 16.8 (H) 11.5 - 15.5 %   Platelets 337 150 - 400 K/uL  Basic metabolic panel     Status: Abnormal   Collection Time: 05/25/14  7:00 PM  Result Value Ref Range   Sodium 138 135 - 145 mmol/L   Potassium 2.9 (L) 3.5 - 5.1 mmol/L   Chloride 99 96 - 112 mmol/L   CO2 28 19 - 32 mmol/L   Glucose, Bld 116 (H) 70 - 99 mg/dL   BUN 14 6 - 23 mg/dL   Creatinine, Ser 1.46 (H) 0.50 - 1.10 mg/dL   Calcium 8.6 8.4 - 10.5 mg/dL   GFR calc non Af Amer 30 (L) >90 mL/min   GFR calc Af Amer 35 (L) >90 mL/min    Comment: (NOTE) The eGFR has been calculated using the CKD EPI equation. This calculation has not been validated in all clinical situations. eGFR's persistently <90  mL/min signify possible Chronic Kidney Disease.    Anion gap 11 5 - 15  Protime-INR     Status: None   Collection  Time: 05/25/14  7:00 PM  Result Value Ref Range   Prothrombin Time 14.9 11.6 - 15.2 seconds   INR 1.15 0.00 - 1.49  Type and screen     Status: None   Collection Time: 05/25/14  7:39 PM  Result Value Ref Range   ABO/RH(D) A POS    Antibody Screen NEG    Sample Expiration 05/28/2014   CBC     Status: Abnormal   Collection Time: 05/26/14  6:25 AM  Result Value Ref Range   WBC 7.9 4.0 - 10.5 K/uL   RBC 3.69 (L) 3.87 - 5.11 MIL/uL   Hemoglobin 9.1 (L) 12.0 - 15.0 g/dL   HCT 30.0 (L) 36.0 - 46.0 %   MCV 81.3 78.0 - 100.0 fL   MCH 24.7 (L) 26.0 - 34.0 pg   MCHC 30.3 30.0 - 36.0 g/dL   RDW 17.0 (H) 11.5 - 15.5 %   Platelets 302 150 - 400 K/uL  Basic metabolic panel     Status: Abnormal   Collection Time: 05/26/14  6:25 AM  Result Value Ref Range   Sodium 139 135 - 145 mmol/L   Potassium 3.2 (L) 3.5 - 5.1 mmol/L   Chloride 102 96 - 112 mmol/L   CO2 29 19 - 32 mmol/L   Glucose, Bld 94 70 - 99 mg/dL   BUN 13 6 - 23 mg/dL   Creatinine, Ser 1.21 (H) 0.50 - 1.10 mg/dL   Calcium 8.4 8.4 - 10.5 mg/dL   GFR calc non Af Amer 38 (L) >90 mL/min   GFR calc Af Amer 44 (L) >90 mL/min    Comment: (NOTE) The eGFR has been calculated using the CKD EPI equation. This calculation has not been validated in all clinical situations. eGFR's persistently <90 mL/min signify possible Chronic Kidney Disease.    Anion gap 8 5 - 15  Basic metabolic panel     Status: Abnormal (Preliminary result)   Collection Time: 05/26/14  9:00 AM  Result Value Ref Range   Sodium PENDING 135 - 145 mmol/L   Potassium 3.0 (L) 3.5 - 5.1 mmol/L   Chloride PENDING 96 - 112 mmol/L   CO2 PENDING 19 - 32 mmol/L   Glucose, Bld 105 (H) 70 - 99 mg/dL   BUN 12 6 - 23 mg/dL   Creatinine, Ser 1.25 (H) 0.50 - 1.10 mg/dL   Calcium 8.6 8.4 - 10.5 mg/dL   GFR calc non Af Amer 36 (L) >90 mL/min   GFR calc Af Amer 42  (L) >90 mL/min    Comment: (NOTE) The eGFR has been calculated using the CKD EPI equation. This calculation has not been validated in all clinical situations. eGFR's persistently <90 mL/min signify possible Chronic Kidney Disease.    Anion gap PENDING 5 - 15    Dg Chest Portable 1 View  05/25/2014   CLINICAL DATA:  Hip fracture.  Hip injury.  EXAM: PORTABLE CHEST - 1 VIEW  COMPARISON:  01/28/2014.  FINDINGS: Cardiopericardial silhouette upper limits of normal for projection. Aortic arch atherosclerosis. Fluid level is present in the inferior LEFT chest, compatible with moderate hiatal hernia and gastric fluid level. RIGHT basilar perihilar airspace opacity which may represent edema, aspiration, or pneumonia. This extends to the RIGHT costophrenic angle.  Monitoring leads project over the chest. No focal consolidation in the LEFT lung.  IMPRESSION: 1. RIGHT infrahilar and perihilar airspace disease may represent asymmetric pulmonary edema, aspiration or pneumonia. 2. Moderate hiatal hernia with fluid level in the  LEFT chest.   Electronically Signed   By: Dereck Ligas M.D.   On: 05/25/2014 20:04   Dg Hip Unilat With Pelvis 2-3 Views Right  05/25/2014   CLINICAL DATA:  One week history of right hip pain without known trauma  EXAM: RIGHT HIP (WITH PELVIS) 2-3 VIEWS  COMPARISON:  Pelvis and left hip of March 29, 2014  FINDINGS: There is a mildly impacted fracture of the neck of the right femoral head. The femoral head remains appropriately related to the acetabulum. The intertrochanteric region of the right femur is intact. The bony pelvis is osteopenic. There is no lytic or blastic lesion nor acute fracture. There are degenerative changes of the lower lumbar spine. The observed portions of the sacrum are unremarkable. The left hip is unremarkable.  IMPRESSION: 1. There is an acute impacted slightly angulated subcapital fracture of the right hip. 2. There is no acute fracture of the bony pelvis. 3. These  results will be called to the ordering clinician or representative by the Radiologist Assistant, and communication documented in the PACS or zVision Dashboard.   Electronically Signed   By: David  Martin[place]   On: 05/25/2014 16:21    Review of Systems  Constitutional: Negative.   HENT: Negative.   Eyes: Negative.        Blind  Cardiovascular: Negative.   Gastrointestinal: Positive for heartburn.  Genitourinary: Negative.   Musculoskeletal: Positive for back pain and joint pain.  Skin: Negative.   Neurological: Positive for sensory change.  Endo/Heme/Allergies: Negative.   Psychiatric/Behavioral: Positive for depression and memory loss. The patient is nervous/anxious.    Blood pressure 158/62, pulse 72, temperature 98.4 F (36.9 C), temperature source Oral, resp. rate 18, height _0  (1.626 m), weight 65.772 kg (145 lb), SpO2 95 %. Physical Exam  Constitutional: She appears well-developed and well-nourished.  HENT:  Head: Normocephalic.  Eyes: Pupils are equal, round, and reactive to light.  Neck: Neck supple. No JVD present. No tracheal deviation present. No thyromegaly present.  Cardiovascular: Normal rate and intact distal pulses.   Respiratory: Effort normal and breath sounds normal.  GI: Soft. There is no tenderness. There is no guarding.  Musculoskeletal:       Right hip: She exhibits decreased range of motion, decreased strength, tenderness and bony tenderness. She exhibits no swelling, no deformity and no laceration.  Lymphadenopathy:    She has no cervical adenopathy.  Neurological: She is alert.  Skin: Skin is warm and dry.  Psychiatric: She has a normal mood and affect.    Assessment/Plan: Right femoral neck fracture  NPO now Will obtain consent for a right hip hemiarthroplasty Plan on surgery later today.    Pricilla Loveless 05/26/2014, 10:07 AM

## 2014-05-26 NOTE — Progress Notes (Signed)
PROGRESS NOTE  [name] DZH:[id number] DOB: Oct 03, 1919 DOA: 05/25/2014 PCP: Kandice Hams, MD  HPI/Recap of past 24 hours: Reported right hip pain with movement, reported chronic back pain, chronic hemoptysis. Denies chest pain, no sob. Daughter in room.  Assessment/Plan: Principal Problem:   Subcapital fracture of right hip Active Problems:   Essential hypertension   Atrial fibrillation   Lung mass   COLD (chronic obstructive lung disease)   Hip fracture  right hip fracture, ortho consulted, family to discuss surgery options with ortho. Currently npo, on gentle hydration, prn pain meds.  Hypokalemia: replace k, check mag  Chronic afib, rate controlled.  Not a candidate for anticoagualtion.  CKD III at baseline  lung mass with hemoptysis, patient declined further work up. No respiratory distress. Does has chronic hemoptysis, monitor h/h   Code Status: DNR  Family Communication: patient and daughter  Disposition Plan: inpatient   Consultants:  orthopedics  Procedures:  Hip surgery  Antibiotics:  none   Objective: BP 158/62 mmHg  Pulse 72  Temp(Src) 98.4 F (36.9 C) (Oral)  Resp 18  Ht 5\' 4"  (1.626 m)  Wt 65.772 kg (145 lb)  BMI 24.88 kg/m2  SpO2 95%  Intake/Output Summary (Last 24 hours) at 05/26/14 0931 Last data filed at 05/26/14 0700  Gross per 24 hour  Intake    655 ml  Output      0 ml  Net    655 ml   Filed Weights   05/25/14 1824  Weight: 65.772 kg (145 lb)    Exam:   General:  AAox3, NAD  Cardiovascular: IRRR, 3/6 systolic murmur at right upper sternal border  Respiratory: decreased at bases, no wheezing/rales/rhonchi  Abdomen: soft/Nd/ND, positive bowel sounds  Musculoskeletal: right leg shortened, externally rotated  Neuro: AAOx3  Data Reviewed: Basic Metabolic Panel:  Recent Labs Lab 05/25/14 1900 05/26/14 0625  NA 138 139  K 2.9* 3.2*  CL 99 102  CO2 28 29  GLUCOSE 116* 94  BUN 14 13  CREATININE  1.46* 1.21*  CALCIUM 8.6 8.4   Liver Function Tests: No results for input(s): AST, ALT, ALKPHOS, BILITOT, PROT, ALBUMIN in the last 168 hours. No results for input(s): LIPASE, AMYLASE in the last 168 hours. No results for input(s): AMMONIA in the last 168 hours. CBC:  Recent Labs Lab 05/25/14 1900 05/26/14 0625  WBC 10.0 7.9  HGB 9.6* 9.1*  HCT 31.6* 30.0*  MCV 80.6 81.3  PLT 337 302   Cardiac Enzymes:   No results for input(s): CKTOTAL, CKMB, CKMBINDEX, TROPONINI in the last 168 hours. BNP (last 3 results) No results for input(s): BNP in the last 8760 hours.  ProBNP (last 3 results) No results for input(s): PROBNP in the last 8760 hours.  CBG: No results for input(s): GLUCAP in the last 168 hours.  No results found for this or any previous visit (from the past 240 hour(s)).   Studies: Dg Chest Portable 1 View  05/25/2014   CLINICAL DATA:  Hip fracture.  Hip injury.  EXAM: PORTABLE CHEST - 1 VIEW  COMPARISON:  01/28/2014.  FINDINGS: Cardiopericardial silhouette upper limits of normal for projection. Aortic arch atherosclerosis. Fluid level is present in the inferior LEFT chest, compatible with moderate hiatal hernia and gastric fluid level. RIGHT basilar perihilar airspace opacity which may represent edema, aspiration, or pneumonia. This extends to the RIGHT costophrenic angle.  Monitoring leads project over the chest. No focal consolidation in the LEFT lung.  IMPRESSION: 1. RIGHT infrahilar and  perihilar airspace disease may represent asymmetric pulmonary edema, aspiration or pneumonia. 2. Moderate hiatal hernia with fluid level in the LEFT chest.   Electronically Signed   By: Dereck Ligas M.D.   On: 05/25/2014 20:04   Dg Hip Unilat With Pelvis 2-3 Views Right  05/25/2014   CLINICAL DATA:  One week history of right hip pain without known trauma  EXAM: RIGHT HIP (WITH PELVIS) 2-3 VIEWS  COMPARISON:  Pelvis and left hip of March 29, 2014  FINDINGS: There is a mildly impacted  fracture of the neck of the right femoral head. The femoral head remains appropriately related to the acetabulum. The intertrochanteric region of the right femur is intact. The bony pelvis is osteopenic. There is no lytic or blastic lesion nor acute fracture. There are degenerative changes of the lower lumbar spine. The observed portions of the sacrum are unremarkable. The left hip is unremarkable.  IMPRESSION: 1. There is an acute impacted slightly angulated subcapital fracture of the right hip. 2. There is no acute fracture of the bony pelvis. 3. These results will be called to the ordering clinician or representative by the Radiologist Assistant, and communication documented in the PACS or zVision Dashboard.   Electronically Signed   By: David  [place]   On: 05/25/2014 16:21    Scheduled Meds: . amLODipine  5 mg Oral Daily  . antiseptic oral rinse  7 mL Mouth Rinse BID  . donepezil  10 mg Oral QHS  . escitalopram  20 mg Oral q morning - 10a  . fluticasone  2 spray Each Nare Daily  . gabapentin  200 mg Oral QHS  . heparin  5,000 Units Subcutaneous 3 times per day  . hydrOXYzine  25 mg Oral QHS  . levothyroxine  88 mcg Oral QAC breakfast  . potassium chloride  20 mEq Oral BID  . pravastatin  40 mg Oral QPM  . senna-docusate  1 tablet Oral Daily  . senna-docusate  2 tablet Oral QHS  . tiotropium  18 mcg Inhalation Daily    Continuous Infusions: . sodium chloride 50 mL/hr at 05/25/14 2242       Illianna Paschal  [facility] Pager [phone]. If 7PM-7AM, please contact night-coverage at [email or web address], password [facility] 05/26/2014, 9:31 AM  LOS: 1 day

## 2014-05-26 NOTE — Anesthesia Postprocedure Evaluation (Signed)
  Anesthesia Post-op Note  Patient: [name]  Procedure(s) Performed: Procedure(s): ARTHROPLASTY BIPOLAR HIP (Right)  Patient Location: PACU  Anesthesia Type: General   Level of Consciousness: awake, alert  and oriented  Airway and Oxygen Therapy: Patient Spontanous Breathing  Post-op Pain: mild  Post-op Assessment: Post-op Vital signs reviewed  Post-op Vital Signs: Reviewed  Last Vitals:  Filed Vitals:   05/26/14 1927  BP:   Pulse: 64  Temp:   Resp: 14    Complications: No apparent anesthesia complications

## 2014-05-26 NOTE — Consult Note (Signed)
Reason for Consult:   Right hip fracture Referring Physician: ED Physician  [name] is an 81 y.o. female.  HPI: Female [name] is a 81 y.o. female presents with a right hip fracture. Patient has a lung mass which is suspected to be malignant however she does not want any workup to be done for this. She apparently had been complaining about pain in the right hip for some time now. She states that she did not fall. She had no trauma to the hip. Patient is normally ambulatory and very active. She went to her PCP and was told to get further evaluation. On radiological studies it was found that she has a right hip fracture.  She was brought to the ER and Ortho was consulted.  Discussion was had regarding surgery.  Risks, benefits and expectations were discussed with the patient.  Risks including but not limited to the risk of anesthesia, blood clots, nerve damage, blood vessel damage, failure of the prosthesis, infection and up to and including death.  Patient understand the risks, benefits and expectations and wishes to proceed with surgery.    Past Medical History  Diagnosis Date  . Cholelithiases   . Hypothyroid   . Leg paresthesia   . Aortic valve stenosis   . Arthritis   . Hyperlipidemia   . Dementia   . Carotid artery occlusion   . Renal disorder   . Hypertension   . COPD (chronic obstructive pulmonary disease)   . SEIZURE DISORDER 11/05/2006  . GERD 11/05/2006  . CVA 02/07/2007  . Macular degeneration of both eyes   . Blind in both eyes   . Peripheral neuropathy   . Pneumonia     "many times"  . Internal hemorrhoid, bleeding   . Malaria     "as a child"  . Skin cancer     "nose, face mostly"  . Chronic lower back pain     "when I go to the bathroom"  . Intestinal infection due to Clostridium difficile 09/2011  . Benign neoplasm of breast   . Gout, unspecified   . Anemia   . Anxiety   . Other dysfunctions of sleep stages or arousal from sleep   . Depression    . Chronic rhinitis   . Abscess of lung(513.0)     RUL  . Diaphragmatic hernia without mention of obstruction or gangrene   . Diverticulosis of colon (without mention of hemorrhage)   . Acute duodenal ulcer with perforation and obstruction 08/2011    perforation  . Hemorrhage of rectum and anus   . Abscess of intestine 09/2011    drainage  . Other psoriasis   . Lumbago   . Nonspecific elevation of levels of transaminase or lactic acid dehydrogenase (LDH)   . Solitary pulmonary nodule     RLL nodule 1 cm  . Transient ischemic attack (TIA), and cerebral infarction without residual deficits(V12.54)   . Long term (current) use of anticoagulants   . Sacroiliitis 08/27/2012  . Anemia, iron deficiency 10/16/2011  . Congestive heart failure   . Spondylosis of lumbosacral region 09/24/2012    L/S xray 08/2012: Mild to moderate osteoarthritis and degenerative spondylosis he diffusely. Anterior subluxation of L4 relative to L5 at 5.7 cm. Mild to moderate levoconvex curvature the mid lumbar spine    . Dysrhythmia     hx atrial fib  . Arteritis     hx giant cell arteritis  . Aortic stenosis, severe 03/02/2013    Followed since  2004 with mild aortic valve stenosis and mild regurgitation. Echocardiograms 2006-2013 show  progression from mild to moderate aortic stenosis, mild insufficiency. Mild to moderate mitral valve regurgitation evident in 2013.   07/25/11: Worsening valve disease by echocardiogram, pt is not interested in invasive treatment. Symptoms appear stable for now. Continue to monitor       Past Surgical History  Procedure Laterality Date  . Vesicovaginal fistula closure w/ tah    . Tonsillectomy    . Breast lumpectomy      bilaterally  . Cataract extraction w/ intraocular lens  implant, bilateral    . Skin cancer excision      "nose and face"  . Abdominal hysterectomy    . Hemorrhoid surgery  11/2008    banding    Family History  Problem Relation Age of Onset  . Hyperlipidemia    .  Hypertension    . Stroke    . Colon cancer Daughter   . Heart attack Father   . Kidney failure Father   . Heart disease Son     Social History:  reports that she quit smoking about 20 years ago. Her smoking use included Cigarettes. She has a 50 pack-year smoking history. She has never used smokeless tobacco. She reports that she drinks alcohol. She reports that she does not use illicit drugs.  Allergies:  Allergies  Allergen Reactions  . Cortisone Other (See Comments)    Rash   . Minocin [Minocycline Hcl] Other (See Comments)    Unknown  . Minocycline Hcl     unknown  . Neomycin-Bacitracin Zn-Polymyx     unknown  . Neosporin [Neomycin-Bacitracin Zn-Polymyx] Other (See Comments)    Unknown  . Other Other (See Comments)    Steroidal Neuromuscular Blockers  . Tetanus Toxoids Other (See Comments)    Unknown     Results for orders placed or performed during the hospital encounter of 05/25/14 (from the past 48 hour(s))  CBC     Status: Abnormal   Collection Time: 05/25/14  7:00 PM  Result Value Ref Range   WBC 10.0 4.0 - 10.5 K/uL   RBC 3.92 3.87 - 5.11 MIL/uL   Hemoglobin 9.6 (L) 12.0 - 15.0 g/dL   HCT 31.6 (L) 36.0 - 46.0 %   MCV 80.6 78.0 - 100.0 fL   MCH 24.5 (L) 26.0 - 34.0 pg   MCHC 30.4 30.0 - 36.0 g/dL   RDW 16.8 (H) 11.5 - 15.5 %   Platelets 337 150 - 400 K/uL  Basic metabolic panel     Status: Abnormal   Collection Time: 05/25/14  7:00 PM  Result Value Ref Range   Sodium 138 135 - 145 mmol/L   Potassium 2.9 (L) 3.5 - 5.1 mmol/L   Chloride 99 96 - 112 mmol/L   CO2 28 19 - 32 mmol/L   Glucose, Bld 116 (H) 70 - 99 mg/dL   BUN 14 6 - 23 mg/dL   Creatinine, Ser 1.46 (H) 0.50 - 1.10 mg/dL   Calcium 8.6 8.4 - 10.5 mg/dL   GFR calc non Af Amer 30 (L) >90 mL/min   GFR calc Af Amer 35 (L) >90 mL/min    Comment: (NOTE) The eGFR has been calculated using the CKD EPI equation. This calculation has not been validated in all clinical situations. eGFR's persistently <90  mL/min signify possible Chronic Kidney Disease.    Anion gap 11 5 - 15  Protime-INR     Status: None   Collection  Time: 05/25/14  7:00 PM  Result Value Ref Range   Prothrombin Time 14.9 11.6 - 15.2 seconds   INR 1.15 0.00 - 1.49  Type and screen     Status: None   Collection Time: 05/25/14  7:39 PM  Result Value Ref Range   ABO/RH(D) A POS    Antibody Screen NEG    Sample Expiration 05/28/2014   CBC     Status: Abnormal   Collection Time: 05/26/14  6:25 AM  Result Value Ref Range   WBC 7.9 4.0 - 10.5 K/uL   RBC 3.69 (L) 3.87 - 5.11 MIL/uL   Hemoglobin 9.1 (L) 12.0 - 15.0 g/dL   HCT 30.0 (L) 36.0 - 46.0 %   MCV 81.3 78.0 - 100.0 fL   MCH 24.7 (L) 26.0 - 34.0 pg   MCHC 30.3 30.0 - 36.0 g/dL   RDW 17.0 (H) 11.5 - 15.5 %   Platelets 302 150 - 400 K/uL  Basic metabolic panel     Status: Abnormal   Collection Time: 05/26/14  6:25 AM  Result Value Ref Range   Sodium 139 135 - 145 mmol/L   Potassium 3.2 (L) 3.5 - 5.1 mmol/L   Chloride 102 96 - 112 mmol/L   CO2 29 19 - 32 mmol/L   Glucose, Bld 94 70 - 99 mg/dL   BUN 13 6 - 23 mg/dL   Creatinine, Ser 1.21 (H) 0.50 - 1.10 mg/dL   Calcium 8.4 8.4 - 10.5 mg/dL   GFR calc non Af Amer 38 (L) >90 mL/min   GFR calc Af Amer 44 (L) >90 mL/min    Comment: (NOTE) The eGFR has been calculated using the CKD EPI equation. This calculation has not been validated in all clinical situations. eGFR's persistently <90 mL/min signify possible Chronic Kidney Disease.    Anion gap 8 5 - 15  Basic metabolic panel     Status: Abnormal (Preliminary result)   Collection Time: 05/26/14  9:00 AM  Result Value Ref Range   Sodium PENDING 135 - 145 mmol/L   Potassium 3.0 (L) 3.5 - 5.1 mmol/L   Chloride PENDING 96 - 112 mmol/L   CO2 PENDING 19 - 32 mmol/L   Glucose, Bld 105 (H) 70 - 99 mg/dL   BUN 12 6 - 23 mg/dL   Creatinine, Ser 1.25 (H) 0.50 - 1.10 mg/dL   Calcium 8.6 8.4 - 10.5 mg/dL   GFR calc non Af Amer 36 (L) >90 mL/min   GFR calc Af Amer 42  (L) >90 mL/min    Comment: (NOTE) The eGFR has been calculated using the CKD EPI equation. This calculation has not been validated in all clinical situations. eGFR's persistently <90 mL/min signify possible Chronic Kidney Disease.    Anion gap PENDING 5 - 15    Dg Chest Portable 1 View  05/25/2014   CLINICAL DATA:  Hip fracture.  Hip injury.  EXAM: PORTABLE CHEST - 1 VIEW  COMPARISON:  01/28/2014.  FINDINGS: Cardiopericardial silhouette upper limits of normal for projection. Aortic arch atherosclerosis. Fluid level is present in the inferior LEFT chest, compatible with moderate hiatal hernia and gastric fluid level. RIGHT basilar perihilar airspace opacity which may represent edema, aspiration, or pneumonia. This extends to the RIGHT costophrenic angle.  Monitoring leads project over the chest. No focal consolidation in the LEFT lung.  IMPRESSION: 1. RIGHT infrahilar and perihilar airspace disease may represent asymmetric pulmonary edema, aspiration or pneumonia. 2. Moderate hiatal hernia with fluid level in the  LEFT chest.   Electronically Signed   By: Dereck Ligas M.D.   On: 05/25/2014 20:04   Dg Hip Unilat With Pelvis 2-3 Views Right  05/25/2014   CLINICAL DATA:  One week history of right hip pain without known trauma  EXAM: RIGHT HIP (WITH PELVIS) 2-3 VIEWS  COMPARISON:  Pelvis and left hip of March 29, 2014  FINDINGS: There is a mildly impacted fracture of the neck of the right femoral head. The femoral head remains appropriately related to the acetabulum. The intertrochanteric region of the right femur is intact. The bony pelvis is osteopenic. There is no lytic or blastic lesion nor acute fracture. There are degenerative changes of the lower lumbar spine. The observed portions of the sacrum are unremarkable. The left hip is unremarkable.  IMPRESSION: 1. There is an acute impacted slightly angulated subcapital fracture of the right hip. 2. There is no acute fracture of the bony pelvis. 3. These  results will be called to the ordering clinician or representative by the Radiologist Assistant, and communication documented in the PACS or zVision Dashboard.   Electronically Signed   By: David  Martinique   On: 05/25/2014 16:21    Review of Systems  Constitutional: Negative.   HENT: Negative.   Eyes: Negative.        Blind  Cardiovascular: Negative.   Gastrointestinal: Positive for heartburn.  Genitourinary: Negative.   Musculoskeletal: Positive for back pain and joint pain.  Skin: Negative.   Neurological: Positive for sensory change.  Endo/Heme/Allergies: Negative.   Psychiatric/Behavioral: Positive for depression and memory loss. The patient is nervous/anxious.    Blood pressure 158/62, pulse 72, temperature 98.4 F (36.9 C), temperature source Oral, resp. rate 18, height _0  (1.626 m), weight 65.772 kg (145 lb), SpO2 95 %. Physical Exam  Constitutional: She appears well-developed and well-nourished.  HENT:  Head: Normocephalic.  Eyes: Pupils are equal, round, and reactive to light.  Neck: Neck supple. No JVD present. No tracheal deviation present. No thyromegaly present.  Cardiovascular: Normal rate and intact distal pulses.   Respiratory: Effort normal and breath sounds normal.  GI: Soft. There is no tenderness. There is no guarding.  Musculoskeletal:       Right hip: She exhibits decreased range of motion, decreased strength, tenderness and bony tenderness. She exhibits no swelling, no deformity and no laceration.  Lymphadenopathy:    She has no cervical adenopathy.  Neurological: She is alert.  Skin: Skin is warm and dry.  Psychiatric: She has a normal mood and affect.    Assessment/Plan: Right femoral neck fracture  NPO now Will obtain consent for a right hip hemiarthroplasty Plan on surgery later today.    Pricilla Loveless 05/26/2014, 10:07 AM

## 2014-05-26 NOTE — Progress Notes (Signed)
Orthopedic Tech Progress Note Patient Details:  [name] [birth year] [id number]  Patient ID: [name], female   DOB: 26-Jul-1919, 81 y.o.   MRN: [id number] Pt unable to use trapeze bar patient helper  Hildred Priest 05/26/2014, 7:09 AM

## 2014-05-26 NOTE — Interval H&P Note (Signed)
History and Physical Interval Note:  05/26/2014 5:20 PM  [name]  has presented today for surgery, with the diagnosis of right femoral neck fracture  The various methods of treatment have been discussed with the patient and family. After consideration of risks, benefits and other options for treatment, the patient has consented to  Procedure(s): ARTHROPLASTY BIPOLAR HIP (Right) as a surgical intervention .  The patient's history has been reviewed, patient examined, no change in status, stable for surgery.  I have reviewed the patient's chart and labs.  Questions were answered to the patient's satisfaction.     Mauri Pole

## 2014-05-26 NOTE — Progress Notes (Signed)
Pt's daughter Lynden Ang would like to speak with Dr. Alvan Dame (or whoever does orthopedic consult) this morning to discuss plan for this pt. She can be reached at [phone].

## 2014-05-26 NOTE — Op Note (Signed)
NAME:  [name]                ACCOUNT NO.:  [id number]   MEDICAL RECORD NO.: [id number]   LOCATION:  1194                         FACILITY:  [facility]   DATE OF BIRTH:  07/11/1919  PHYSICIAN:  Pietro Cassis. Alvan Dame, M.D.     DATE OF PROCEDURE:  05/26/14                               OPERATIVE REPORT     PREOPERATIVE DIAGNOSIS:  Right displaced femoral neck fracture.   POSTOPERATIVE DIAGNOSIS:  Right displaced femoral neck fracture.   PROCEDURE:  Right hip hemiarthroplasty utilizing DePuy component, size 5 standard Press [employer] Basic stem with a 4mm unipolar ball with a +0 adapter.   SURGEON:  Pietro Cassis. Alvan Dame, MD   ASSISTANT:  Danae Orleans, PA-C.   ANESTHESIA:  General.   SPECIMENS:  None.   DRAINS:  None.   BLOOD LOSS:  About 50 cc.   COMPLICATIONS:  None.   INDICATION OF PROCEDURE:  [name] is a 80 year old female who lives independently.  She unfortunately had a fall at her house.  She was admitted to the hospital after radiographs revealed a femoral neck fracture.  She was seen and evaluated and was scheduled for surgery for fixation.  The necessity of surgical repair was discussed with she and her family.  Consent was obtained after reviewing risks of infection, DVT, component failure, and need for revision surgery.   PROCEDURE IN DETAIL:  The patient was brought to the operative theater. Once adequate anesthesia, preoperative antibiotics, 2 g of Ancef administered, the patient was positioned into the left lateral decubitus position with the right side up.  The right lower extremity was then prepped and draped in sterile fashion.  A time-out was performed identifying the patient, planned procedure, and extremity.   A lateral incision was made off the proximal trochanter. Sharp dissection was carried down to the iliotibial band and gluteal fascia. The gluteal fascia was then incised for posterior approach.  The short external rotators were taken down  separate from the posterior capsule. An L capsulotomy was made preserving the posterior leaflet for later anatomic repair. Fracture site was identified and after removing comminuted segments of the posterior femoral neck, the femoral head was removed without difficulty and measured on the back table  using the sizing rings and determined to be 40mm mm in diameter.   The proximal femur was then exposed.  Retractors placed.  I then drilled, opened the proximal femur.  Then I hand reamed once and  Irrigated the canal to try to prevent fat emboli.  I began broaching the femur with a starting size 1 broach up to a size 5 broach with good medial and lateral metaphyseal fit without evidence of any torsion or movement.  A trial reduction was carried out with a standard neck and a +0 adapter with a 58mm ball.  The hip reduced nicely.  The leg lengths appeared to be equal compared to the down leg.   The hip went through a range of motion without evidence of any subluxation or impingement.   Given these findings, the trial components removed.  The final size 5  [employer] Basic press fit stem was opened.  After irrigating the  canal, the final stem was impacted and sat at the level where the broach was. Based on this and the trial reduction, a +0 adapter was opened and impacted in the 24mm unipolar ball onto a clean and dry trunnion.  The hip had been irrigated throughout the case and again at this point.  I re- Approximated the posterior capsule to the superior leaflet using a  #1 Vicryl.  The remainder of the wound was closed with #1 Vicryl in the iliotibial band and gluteal fascia, a  2-0 Vicryl in the sub-Q tissue and a running 4-0 Monocryl in the skin.  The hip was cleaned, dried, and dressed sterilely using Dermabond and Aquacel dressing.  She was then brought to recovery room, extubated in stable condition, tolerating the procedure well.  Danae Orleans, PA-C was present and utilized as  Environmental consultant for the entire case from  Preoperative positioning to management of the contralateral extremity and retractors to  General facilitation of the procedure.  He was also involved with primary wound closure.         Pietro Cassis Alvan Dame, M.D.

## 2014-05-26 NOTE — Progress Notes (Signed)
Utilization review completed.  

## 2014-05-26 NOTE — Progress Notes (Signed)
Beth, CNA from [facility] came down and got a ring that pt removed from her left pinky finger.

## 2014-05-26 NOTE — Anesthesia Preprocedure Evaluation (Addendum)
Anesthesia Evaluation  Patient identified by MRN, date of birth, ID band Patient awake    Reviewed: Allergy & Precautions, H&P , NPO status , Patient's Chart, lab work & pertinent test results  Airway Mallampati: I  TM Distance: >3 FB Neck ROM: Full    Dental no notable dental hx. (+) Partial Lower, Dental Advisory Given   Pulmonary COPDformer smoker,  breath sounds clear to auscultation  Pulmonary exam normal       Cardiovascular hypertension, Pt. on medications + Peripheral Vascular Disease + dysrhythmias Atrial Fibrillation + Valvular Problems/Murmurs AS and MR Rhythm:Regular Rate:Normal     Neuro/Psych Seizures -,  Anxiety Depression CVA    GI/Hepatic Neg liver ROS, GERD-  ,  Endo/Other  Hypothyroidism   Renal/GU Renal InsufficiencyRenal disease  negative genitourinary   Musculoskeletal  (+) Arthritis -, Osteoarthritis,    Abdominal   Peds  Hematology negative hematology ROS (+)   Anesthesia Other Findings   Reproductive/Obstetrics negative OB ROS                           Anesthesia Physical Anesthesia Plan  ASA: IV  Anesthesia Plan: General   Post-op Pain Management:    Induction: Intravenous  Airway Management Planned: Oral ETT  Additional Equipment: Arterial line  Intra-op Plan:   Post-operative Plan: Extubation in OR and Possible Post-op intubation/ventilation  Informed Consent: I have reviewed the patients History and Physical, chart, labs and discussed the procedure including the risks, benefits and alternatives for the proposed anesthesia with the patient or authorized representative who has indicated his/her understanding and acceptance.   Dental advisory given  Plan Discussed with: CRNA, Anesthesiologist and Surgeon  Anesthesia Plan Comments:        Anesthesia Quick Evaluation

## 2014-05-26 NOTE — Progress Notes (Signed)
OT Cancellation Note  Patient Details Name: [name] MRN: [id number] DOB: 07/24/1919   Cancelled Treatment:    Reason Eval/Treat Not Completed: Patient not medically ready. Pt planned for OR today.   Benito Mccreedy OTR/L [phone]  05/26/2014, 8:35 AM

## 2014-05-26 NOTE — Progress Notes (Signed)
Inpatient RN visit-[name] [facility]  Room 3-[facility] RN Visit-[name] [place] RN  Related admission to [facility] diagnosis of Lung Ca.   Pt is DNR  code.  Patient to [facility] ED yesterday from her PCP office for evaluation of a right hip fracture seen on xray. Patient reports no fall or trauma to her hip. Patient seen at bedside alert and oriented x4. Patient lying in bed, reports pain in her right hip "when I move". Daughters [name] and Belenda Cruise present during visit. Per chart review patient has recvd one PRN  dose of Vicodin and one PRN dose of IV morphine 0.5mg  for right hip pain. Current plan is for patient to have right hip hemiarthroplasty this afternoon. Patient and daughters had questions about pain management after surgery, reassurance given that pain would be managed and [facility] will continue to follow and collaborate with attending physician to ensure her comfort.  Patient's home medication list, transfer summary and OOF DNR in place on shadow chart.  Please call [facility] @ [phone]  with any hospice needs.   Thank you. Flo Shanks, RN, BSN,  [place] Liaison  [phone])

## 2014-05-26 NOTE — Transfer of Care (Signed)
Immediate Anesthesia Transfer of Care Note  Patient: [name]  Procedure(s) Performed: Procedure(s): ARTHROPLASTY BIPOLAR HIP (Right)  Patient Location: PACU  Anesthesia Type:General  Level of Consciousness: awake and patient cooperative  Airway & Oxygen Therapy: Patient Spontanous Breathing and Patient connected to face mask oxygen  Post-op Assessment: Report given to RN, Post -op Vital signs reviewed and stable and Patient moving all extremities  Post vital signs: Reviewed and stable  Last Vitals:  Filed Vitals:   05/26/14 1300  BP: 139/47  Pulse: 58  Temp: 36.7 C  Resp: 18    Complications: No apparent anesthesia complications

## 2014-05-26 NOTE — Progress Notes (Signed)
INITIAL NUTRITION ASSESSMENT  Pt meets criteria for SEVERE MALNUTRITION in the context of chronic illness as evidenced by energy intake </75% for >/= 1 month and severe muscle mass loss.  DOCUMENTATION CODES Per approved criteria  -Severe malnutrition in the context of chronic illness   INTERVENTION: Once diet advanced, provide Resource Breeze po TID, each supplement provides 250 kcal and 9 grams of protein.  NUTRITION DIAGNOSIS: Increased nutrient needs related to chronic illness, surgery as evidenced by estimated nutrition needs.   Goal: Pt to meet >/= 90% of their estimated nutrition needs   Monitor:  Diet advancement, weight trends, labs, I/O's  Reason for Assessment: MD consult  81 y.o. female  Admitting Dx: Subcapital fracture of right hip  ASSESSMENT: Pt presents with a right hip fracture. Patient has a lung mass which is suspected to be malignant however she does not want any workup to be done for this. Radiological studies it was found that she has a right hip fracture. Pt with PMH of dementia, COPD, CVA, HTN.  Pt is currently NPO for surgery today. Family present at bedside. Pt reports a decreased appetite due to taste change over the past 6 months. Per Family pt has been eating 2 meals a day, however has been noticing that portions have been small and pt has been mostly eating vegetables and not been consuming much protein foods. Pt with gradual weight loss, however is not found significant. Pt is agreeable to [employer] once diet advances. Pt refused Ensure/Boost as she reports she does not like the taste of them. RD to order [employer].  Nutrition Focused Physical Exam:  Subcutaneous Fat:  Orbital Region: N/A Upper Arm Region: Moderate depletion Thoracic and Lumbar Region: WNL  Muscle:  Temple Region: N/A Clavicle Bone Region: Moderate to severe depletion Clavicle and Acromion Bone Region: Moderate to severe depletion Scapular Bone Region: N/A Dorsal  Hand: N/A Patellar Region: Moderate depletion Anterior Thigh Region: Moderate depletion Posterior Calf Region: Moderate depletion  Edema: none  Labs: Low potassium and GFR. High creatinine.  Height: Ht Readings from Last 1 Encounters:  05/25/14 5\' 4"  (1.626 m)    Weight: Wt Readings from Last 1 Encounters:  05/25/14 145 lb (65.772 kg)    Ideal Body Weight: 120 lbs  % Ideal Body Weight: 121%  Wt Readings from Last 10 Encounters:  05/25/14 145 lb (65.772 kg)  01/25/14 147 lb (66.679 kg)  11/19/13 147 lb 4.8 oz (66.815 kg)  11/06/13 156 lb 1.6 oz (70.806 kg)  11/02/13 157 lb (71.215 kg)  10/29/13 157 lb (71.215 kg)  10/14/13 159 lb (72.122 kg)  09/30/13 155 lb (70.308 kg)  08/26/13 154 lb (69.854 kg)  08/12/13 154 lb (69.854 kg)    Usual Body Weight: 143 lbs (per pt report)  % Usual Body Weight: 101%  BMI:  Body mass index is 24.88 kg/(m^2).  Estimated Nutritional Needs: Kcal: 1700-1850 Protein: 80-90 grams Fluid: 1.7 - 1.85 L/day  Skin: intact  Diet Order: Diet NPO time specified  EDUCATION NEEDS: -No education needs identified at this time   Intake/Output Summary (Last 24 hours) at 05/26/14 0955 Last data filed at 05/26/14 0900  Gross per 24 hour  Intake    655 ml  Output      0 ml  Net    655 ml    Last BM: 2/29  Labs:   Recent Labs Lab 05/25/14 1900 05/26/14 0625 05/26/14 0900  NA 138 139 PENDING  K 2.9* 3.2* 3.0*  CL 99 102 PENDING  CO2 28 29 PENDING  BUN 14 13 12   CREATININE 1.46* 1.21* 1.25*  CALCIUM 8.6 8.4 8.6  GLUCOSE 116* 94 105*    CBG (last 3)  No results for input(s): GLUCAP in the last 72 hours.  Scheduled Meds: . amLODipine  5 mg Oral Daily  . antiseptic oral rinse  7 mL Mouth Rinse BID  . donepezil  10 mg Oral QHS  . escitalopram  20 mg Oral q morning - 10a  . fluticasone  2 spray Each Nare Daily  . gabapentin  200 mg Oral QHS  . heparin  5,000 Units Subcutaneous 3 times per day  . hydrOXYzine  25 mg Oral QHS   . levothyroxine  88 mcg Oral QAC breakfast  . potassium chloride  20 mEq Oral BID  . pravastatin  40 mg Oral QPM  . senna-docusate  1 tablet Oral Daily  . senna-docusate  2 tablet Oral QHS  . tiotropium  18 mcg Inhalation Daily    Continuous Infusions: . sodium chloride 50 mL/hr at 05/25/14 2242    Past Medical History  Diagnosis Date  . Cholelithiases   . Hypothyroid   . Leg paresthesia   . Aortic valve stenosis   . Arthritis   . Hyperlipidemia   . Dementia   . Carotid artery occlusion   . Renal disorder   . Hypertension   . COPD (chronic obstructive pulmonary disease)   . SEIZURE DISORDER 11/05/2006  . GERD 11/05/2006  . CVA 02/07/2007  . Macular degeneration of both eyes   . Blind in both eyes   . Peripheral neuropathy   . Pneumonia     "many times"  . Internal hemorrhoid, bleeding   . Malaria     "as a child"  . Skin cancer     "nose, face mostly"  . Chronic lower back pain     "when I go to the bathroom"  . Intestinal infection due to Clostridium difficile 09/2011  . Benign neoplasm of breast   . Gout, unspecified   . Anemia   . Anxiety   . Other dysfunctions of sleep stages or arousal from sleep   . Depression   . Chronic rhinitis   . Abscess of lung(513.0)     RUL  . Diaphragmatic hernia without mention of obstruction or gangrene   . Diverticulosis of colon (without mention of hemorrhage)   . Acute duodenal ulcer with perforation and obstruction 08/2011    perforation  . Hemorrhage of rectum and anus   . Abscess of intestine 09/2011    drainage  . Other psoriasis   . Lumbago   . Nonspecific elevation of levels of transaminase or lactic acid dehydrogenase (LDH)   . Solitary pulmonary nodule     RLL nodule 1 cm  . Transient ischemic attack (TIA), and cerebral infarction without residual deficits(V12.54)   . Long term (current) use of anticoagulants   . Sacroiliitis 08/27/2012  . Anemia, iron deficiency 10/16/2011  . Congestive heart failure   .  Spondylosis of lumbosacral region 09/24/2012    L/S xray 08/2012: Mild to moderate osteoarthritis and degenerative spondylosis he diffusely. Anterior subluxation of L4 relative to L5 at 5.7 cm. Mild to moderate levoconvex curvature the mid lumbar spine    . Dysrhythmia     hx atrial fib  . Arteritis     hx giant cell arteritis  . Aortic stenosis, severe 03/02/2013    Followed since 2004 with mild  aortic valve stenosis and mild regurgitation. Echocardiograms 2006-2013 show  progression from mild to moderate aortic stenosis, mild insufficiency. Mild to moderate mitral valve regurgitation evident in 2013.   07/25/11: Worsening valve disease by echocardiogram, pt is not interested in invasive treatment. Symptoms appear stable for now. Continue to monitor       Past Surgical History  Procedure Laterality Date  . Vesicovaginal fistula closure w/ tah    . Tonsillectomy    . Breast lumpectomy      bilaterally  . Cataract extraction w/ intraocular lens  implant, bilateral    . Skin cancer excision      "nose and face"  . Abdominal hysterectomy    . Hemorrhoid surgery  11/2008    banding    Kallie Locks, MS, RD, LDN Pager # [phone] After hours/ weekend pager # [phone]

## 2014-05-26 NOTE — Progress Notes (Signed)
PT Cancellation Note  Patient Details Name: [name] MRN: [id number] DOB: 1919/04/15   Cancelled Treatment:    Reason Eval/Treat Not Completed: Patient not medically ready. Pt planned for OR today. PT to return when appropriate/as ordered by MD post surgery.   Kingsley Callander 05/26/2014, 8:02 AM   Kittie Plater, PT, DPT Pager #: [phone] Office #: [phone]

## 2014-05-27 ENCOUNTER — Encounter (HOSPITAL_COMMUNITY): Payer: Self-pay | Admitting: General Practice

## 2014-05-27 LAB — CBC
HEMATOCRIT: 28.2 % — AB (ref 36.0–46.0)
HEMOGLOBIN: 8.6 g/dL — AB (ref 12.0–15.0)
MCH: 24.6 pg — AB (ref 26.0–34.0)
MCHC: 30.5 g/dL (ref 30.0–36.0)
MCV: 80.6 fL (ref 78.0–100.0)
Platelets: 304 10*3/uL (ref 150–400)
RBC: 3.5 MIL/uL — ABNORMAL LOW (ref 3.87–5.11)
RDW: 17.1 % — ABNORMAL HIGH (ref 11.5–15.5)
WBC: 8.4 10*3/uL (ref 4.0–10.5)

## 2014-05-27 LAB — BASIC METABOLIC PANEL
Anion gap: 8 (ref 5–15)
BUN: 11 mg/dL (ref 6–23)
CALCIUM: 8.3 mg/dL — AB (ref 8.4–10.5)
CO2: 27 mmol/L (ref 19–32)
CREATININE: 1.07 mg/dL (ref 0.50–1.10)
Chloride: 101 mmol/L (ref 96–112)
GFR, EST AFRICAN AMERICAN: 51 mL/min — AB (ref >90)
GFR, EST NON AFRICAN AMERICAN: 44 mL/min — AB (ref >90)
GLUCOSE: 91 mg/dL (ref 70–99)
POTASSIUM: 3.5 mmol/L (ref 3.5–5.1)
Sodium: 136 mmol/L (ref 135–145)

## 2014-05-27 MED ORDER — DIPHENHYDRAMINE-ZINC ACETATE 2-0.1 % EX CREA
TOPICAL_CREAM | Freq: Three times a day (TID) | CUTANEOUS | Status: DC | PRN
  Administered 2014-05-27 (×2): via TOPICAL
  Filled 2014-05-27: qty 28

## 2014-05-27 NOTE — Clinical Social Work Psychosocial (Signed)
Clinical Social Work Department BRIEF PSYCHOSOCIAL ASSESSMENT 05/27/2014  Patient:  [name], [name]     Account Number:  [id number]     Admit date:  05/25/2014  Clinical Social Worker:  Glendon Axe, CLINICAL SOCIAL WORKER  Date/Time:  05/27/2014 04:19 PM  Referred by:  Physician  Date Referred:  05/27/2014 Referred for  SNF Placement   Other Referral:   Interview type:  Other - See comment Other interview type:   CSW met with pt and pt's family present at bedside.    PSYCHOSOCIAL DATA Living Status:  FACILITY Admitted from facility:  [facility] Level of care:  Independent Living Primary support name:  Lynden Ang Primary support relationship to patient:  CHILD, ADULT Degree of support available:   Strong    CURRENT CONCERNS Current Concerns  Post-Acute Placement   Other Concerns:    SOCIAL WORK ASSESSMENT / PLAN Clinical Social Worker met with patient and pt's dtr at bedside in reference to post-acute placement. CSW introduced CSW role. Pt's dtr reported pt will be returning to [facility] with palliative care services. Pt stated she has been a resident of [place] for over 20 years and would very much like to return. CSW contacted facility who also confirmed that pt is able to return once medically stable. CSW submitted clinical information to facility, FL-2 completed and on chart for MD signature. Pt expressed that she would like to be evaluated by PT/OT before returnig to facility. MD notified of pt's request. CSW will continue to follow pt and pt's family for continued support and to facilitate pt's discharge once medically stable.   Assessment/plan status:  Psychosocial Support/Ongoing Assessment of Needs Other assessment/ plan:   FL-2 on chart for MD signature. DNR signed and on chart.   Information/referral to community resources:   ALF information.    PATIENT'S/FAMILY'S RESPONSE TO PLAN OF CARE: Pt lying in bed alert and oriented. Pt  and pt's family agreeable to her returning to [place] with palliative care services. Pt upset because she wants to get out of bed and walk, also requesting to be evaluated by PT/OT. Pt and pt's dtr both pleasant and in good spirits. Pt and pt's dtr appreciated social work intervention.       Glendon Axe, MSW, LCSWA [phone] 05/27/2014 4:32 PM

## 2014-05-27 NOTE — Progress Notes (Signed)
OT Cancellation Note  Patient Details Name: [name] MRN: [id number] DOB: [birth year]   Cancelled Treatment:    Reason Eval/Treat Not Completed: OT screened, no needs identified, will sign off. Defer OT services to next level of care - SNF. According to chart, patient from [facility] and plan is for patient to discharge back there. Please re-order OT if needed/appropriate. Thanks!  Sharlon Pfohl , MS, OTR/L, CLT Pager: [phone]  05/27/2014, 2:27 PM

## 2014-05-27 NOTE — Progress Notes (Signed)
Inpatient RN visit-Karmela Daylene Katayama [facility] 5N  Room 3-[facility] RN Visit-Karen Alford [place] RN  Related admission to [facility] diagnosis of Lung Ca Pt is DNR code.   Patient seen with [facility] RN Zenaida Deed. Writer confirmed with patient and daughter [name] that plan is for patient to return back home to [facility]. [facility] will continue to follow. This information confirmed with [facility] SW Hulan Amato and shared with hospital staff MSW Dysheka and attending Dr. Erlinda Hong.  Please call [facility] @ [phone]-  with any hospice needs.   Thank you. Flo Shanks, RN, BSN, [facility]. Hospice Liaison  [phone])

## 2014-05-27 NOTE — Clinical Social Work Note (Signed)
CSW Consult Acknowledged:   Covering CSW received a consult for SNF placement. CSW awaiting PT/OT evaluation to determine the appropriate level of care.      [place], MSW, [place]

## 2014-05-27 NOTE — Progress Notes (Signed)
Inpatient RN visit- [name] [facility] [place] [facility] [place] [facility]) RN Visit - Barbra Sarks RN  Related admission to [facility] diagnosis of Lung Cancer. Pt. Is DNR Code. Patient sitting up in bed trying to put her dentures in. Daughter [name] present. She reports no pain unless she moves. She has not been up out of the bed yet. Per chart review patient has recvd one PRN dose of Vicodin and one PRN dose of IV morphine 0.5mg  for right hip pain.Patient did not eat breakfast this morning per pt. as she reports having trouble swallowing it. She was able to drink orange juice and water without problems.  Dr.Olin, orthopaedic surgeon and Dr. Erlinda Hong, attending in to see patient. Plan of care is to start PT and d/c back to [name]. Pt. c/o  of itching on her forehead and chin. Writer requested topical cream for forehead to ease itching and Benadryl cream ordered by Dr. Erlinda Hong. [facility] will continue to follow. Patient's home medication list, transfer summary and OOF DNR in place on shadow chart.  Please call [facility] @ [phone] with any hospice needs. Barbra Sarks RN

## 2014-05-27 NOTE — Progress Notes (Signed)
Patient ID: [name], female   DOB: 31-Oct-1919, 81 y.o.   MRN: [id number] Subjective: 1 Day Post-Op Procedure(s) (LRB): ARTHROPLASTY BIPOLAR HIP (Right)    Patient reports pain as moderate.  Doing OK, seems a little confused but orients quickly.  Hospice nurses in room  Objective:   VITALS:   Filed Vitals:   05/27/14 0535  BP: 143/62  Pulse: 76  Temp: 98.4 F (36.9 C)  Resp: 15    Neurovascular intact Incision: dressing C/D/I  LABS  Recent Labs  05/25/14 1900 05/26/14 0625 05/27/14 0525  HGB 9.6* 9.1* 8.6*  HCT 31.6* 30.0* 28.2*  WBC 10.0 7.9 8.4  PLT 337 302 304     Recent Labs  05/26/14 0625 05/26/14 0900 05/27/14 0525  NA 139 139 136  K 3.2* 3.0* 3.5  BUN 13 12 11   CREATININE 1.21* 1.25* 1.07  GLUCOSE 94 105* 91     Recent Labs  05/25/14 1900  INR 1.15     Assessment/Plan: 1 Day Post-Op Procedure(s) (LRB): ARTHROPLASTY BIPOLAR HIP (Right)   Advance diet Up with therapy Discharge to SNF when medically stable RTC in 2 weeks

## 2014-05-27 NOTE — Progress Notes (Addendum)
PROGRESS NOTE  [name] DPO:[id number] DOB: [birth year] DOA: 05/25/2014 PCP: Karen Hams, MD  HPI/Recap of past 24 hours: Reported right hip pain with movement, reported chronic back pain, chronic hemoptysis. Denies chest pain, no sob. Reported chronic itchy rash on forehead and back., requesting benedryal cream. Daughter in room. Hospice service in room.   Assessment/Plan: Principal Problem:   Subcapital fracture of right hip Active Problems:   Essential hypertension   Atrial fibrillation   Lung mass   COLD (chronic obstructive lung disease)   Hip fracture   Protein-calorie malnutrition, severe  right hip fracture, Right hip hemiarthroplasty on 3/2, ortho following,prn pain meds. Pt/ot.  Hypokalemia:  k improving,  Mag wnl, cr better. D/c ivf, continue oral k supplement  Chronic afib, rate controlled.  Not a candidate for anticoagualtion.  CKD III at baseline  lung mass with hemoptysis, patient declined further work up. No respiratory distress. Does has chronic hemoptysis, monitor h/h  Severe mal nutrition: nutrition supplement   Code Status: DNR  Family Communication: patient and daughter  Disposition Plan: SNf with hospice at [facility], likely 3/4   Consultants:  orthopedics  Procedures:  Hip surgery  Antibiotics:  none   Objective: BP 143/62 mmHg  Pulse 76  Temp(Src) 98.4 F (36.9 C) (Oral)  Resp 15  Ht 5\' 4"  (1.626 m)  Wt 65.772 kg (145 lb)  BMI 24.88 kg/m2  SpO2 95%  Intake/Output Summary (Last 24 hours) at 05/27/14 1122 Last data filed at 05/27/14 0900  Gross per 24 hour  Intake 1466.67 ml  Output    400 ml  Net 1066.67 ml   Filed Weights   05/25/14 1824  Weight: 65.772 kg (145 lb)    Exam:   General:  AAox3, NAD  Cardiovascular: IRRR, 3/6 systolic murmur at right upper sternal border  Respiratory: decreased at bases, no wheezing/rales/rhonchi  Abdomen: soft/Nd/ND, positive bowel sounds  Musculoskeletal:  right hip post op changes  Neuro: AAOx3  Reported chronic itchy rash on forehead and back  Data Reviewed: Basic Metabolic Panel:  Recent Labs Lab 05/25/14 1900 05/26/14 0625 05/26/14 0900 05/27/14 0525  NA 138 139 139 136  K 2.9* 3.2* 3.0* 3.5  CL 99 102 103 101  CO2 28 29 32 27  GLUCOSE 116* 94 105* 91  BUN 14 13 12 11   CREATININE 1.46* 1.21* 1.25* 1.07  CALCIUM 8.6 8.4 8.6 8.3*  MG  --   --  2.1  --    Liver Function Tests: No results for input(s): AST, ALT, ALKPHOS, BILITOT, PROT, ALBUMIN in the last 168 hours. No results for input(s): LIPASE, AMYLASE in the last 168 hours. No results for input(s): AMMONIA in the last 168 hours. CBC:  Recent Labs Lab 05/25/14 1900 05/26/14 0625 05/27/14 0525  WBC 10.0 7.9 8.4  HGB 9.6* 9.1* 8.6*  HCT 31.6* 30.0* 28.2*  MCV 80.6 81.3 80.6  PLT 337 302 304   Cardiac Enzymes:   No results for input(s): CKTOTAL, CKMB, CKMBINDEX, TROPONINI in the last 168 hours. BNP (last 3 results) No results for input(s): BNP in the last 8760 hours.  ProBNP (last 3 results) No results for input(s): PROBNP in the last 8760 hours.  CBG: No results for input(s): GLUCAP in the last 168 hours.  Recent Results (from the past 240 hour(s))  Surgical pcr screen     Status: None   Collection Time: 05/26/14  3:44 PM  Result Value Ref Range Status   MRSA, PCR NEGATIVE NEGATIVE Final  Staphylococcus aureus NEGATIVE NEGATIVE Final    Comment:        The Xpert SA Assay (FDA approved for NASAL specimens in patients over 74 years of age), is one component of a comprehensive surveillance program.  Test performance has been validated by [facility] for patients greater than or equal to 65 year old. It is not intended to diagnose infection nor to guide or monitor treatment.      Studies: Pelvis Portable  05/26/2014   CLINICAL DATA:  Post RIGHT hip hemiarthroplasty  EXAM: PORTABLE PELVIS 1-2 VIEWS  COMPARISON:  Portable exam 1912 hours compared  to preoperative images of 05/25/2014  FINDINGS: Interval resection of RIGHT femoral head/neck and placement of a femoral prosthesis.  Bones demineralized.  No acute fracture dislocation.  IMPRESSION: RIGHT hip prosthesis without acute complication.   Electronically Signed   By: Lavonia Dana M.D.   On: 05/26/2014 19:31    Scheduled Meds: . amLODipine  5 mg Oral Daily  . antiseptic oral rinse  7 mL Mouth Rinse BID  . donepezil  10 mg Oral QHS  . enoxaparin (LOVENOX) injection  40 mg Subcutaneous Q24H  . escitalopram  20 mg Oral q morning - 10a  . feeding supplement (RESOURCE BREEZE)  1 Container Oral TID BM  . ferrous sulfate  325 mg Oral TID PC  . fluticasone  2 spray Each Nare Daily  . gabapentin  200 mg Oral QHS  . hydrOXYzine  25 mg Oral QHS  . levothyroxine  88 mcg Oral QAC breakfast  . polyethylene glycol  17 g Oral BID  . potassium chloride  20 mEq Oral BID  . pravastatin  40 mg Oral QPM  . senna-docusate  1 tablet Oral Daily  . senna-docusate  2 tablet Oral QHS  . tiotropium  18 mcg Inhalation Daily    Continuous Infusions: lvf stopped on 3/3       [name]  [facility] Pager [phone]. If 7PM-7AM, please contact night-coverage at [email or web address], password [facility] 05/27/2014, 11:22 AM  LOS: 2 days

## 2014-05-27 NOTE — Evaluation (Signed)
Physical Therapy Evaluation Patient Details Name: [name] MRN: [id number] DOB: 08/16/1919 Today's Date: 05/27/2014   History of Present Illness  81 y.o. female admitted to [facility] on 05/25/14 s/p fall at her ALF with right hip fx.  She underwent R hip hemiarthroplasty and is now PWB 50% with posterior hip precautions.   Clinical Impression  Pt was able to stand, but unable to take steps with RW today.  We were able to get her in the recliner chair by standing her and moving the bed and having her sit down.  She would benefit from continued PT services at SNF level at discharge.  She is from [facility].   PT to follow acutely for deficits listed below.       Follow Up Recommendations SNF    Equipment Recommendations  None recommended by PT    Recommendations for Other Services   NA    Precautions / Restrictions Precautions Precautions: Posterior Hip (not ordered, but posterior approach in op note) Precaution Booklet Issued: Yes (comment) Precaution Comments: posterior hip precaution and exercise handout given and reviewed.   Restrictions Weight Bearing Restrictions: Yes RLE Weight Bearing: Partial weight bearing RLE Partial Weight Bearing Percentage or Pounds: 50      Mobility  Bed Mobility Overal bed mobility: Needs Assistance Bed Mobility: Supine to Sit     Supine to sit: Max assist     General bed mobility comments: Max assist to slowly help progress right leg over EOB and support trunk with HOB elevated to get to sitting. Pt pulling with bil hands on bed rail and able to use 1/2 bridge technique to help progress buttocks/hips to EOB.   Transfers Overall transfer level: Needs assistance Equipment used: Rolling walker (2 wheeled) Transfers: Sit to/from Stand Sit to Stand: Max assist         General transfer comment: sit to stand x 2 from elevated bed and elevated BSC.  Pt needs cues for safe hand placement and max assist at trunk to boost up over weak and  painful legs.  Pt maintains flexed trunk posture in standing.  Moved bed out of the way and pulled BSC behind her, same done with the recliner chair after pt done on BSC.  Pt attempted to take pivotal steps, but unable.          Balance Overall balance assessment: Needs assistance Sitting-balance support: Feet supported;Bilateral upper extremity supported Sitting balance-Leahy Scale: Poor     Standing balance support: Bilateral upper extremity supported Standing balance-Leahy Scale: Zero                               Pertinent Vitals/Pain Pain Assessment: 0-10 Pain Score: 8  Pain Location: right hip with exercises, mobility and WB Pain Descriptors / Indicators: Aching;Burning Pain Intervention(s): Limited activity within patient's tolerance;Monitored during session;Repositioned    Home Living Family/patient expects to be discharged to:: Assisted living Environmental education officer)               Home Equipment: Wheelchair - Chiropodist - 2 wheels;Walker - 4 wheels Additional Comments: pt reports on home oxygen    Prior Function Level of Independence: Needs assistance   Gait / Transfers Assistance Needed: uses walker in apt and scooter or w/c in community  ADL's / Homemaking Assistance Needed: pt has caregiver 4 hours per day to help with laundry, meals and as needed with ADL. Pt usually can do own bath, dress.  (  5 days per week)  Comments: Pt has higher toilets and grab bars.         Extremity/Trunk Assessment   Upper Extremity Assessment: Generalized weakness           Lower Extremity Assessment: RLE deficits/detail RLE Deficits / Details: right leg with normal post op pain and weakness.  Pt with 2+/5 ankle DF, 3-/5 knee ext, 2/5 hip    Cervical / Trunk Assessment: Kyphotic  Communication   Communication: No difficulties  Cognition Arousal/Alertness: Awake/alert Behavior During Therapy: WFL for tasks assessed/performed Overall Cognitive  Status: No family/caregiver present to determine baseline cognitive functioning (family left for session)                         Exercises Total Joint Exercises Ankle Circles/Pumps: AAROM;Right;10 reps;Supine Heel Slides: AAROM;Right;10 reps;Supine Hip ABduction/ADduction: AAROM;Right;10 reps;Supine      Assessment/Plan    PT Assessment Patient needs continued PT services  PT Diagnosis Difficulty walking;Abnormality of gait;Generalized weakness;Acute pain   PT Problem List Decreased strength;Decreased range of motion;Decreased mobility;Decreased activity tolerance;Decreased balance;Decreased knowledge of use of DME;Decreased knowledge of precautions;Pain  PT Treatment Interventions DME instruction;Gait training;Stair training;Functional mobility training;Therapeutic activities;Therapeutic exercise;Balance training;Neuromuscular re-education;Patient/family education;Manual techniques;Modalities   PT Goals (Current goals can be found in the Care Plan section) Acute Rehab PT Goals Patient Stated Goal: to get back on her feet, decrease pain PT Goal Formulation: With patient Time For Goal Achievement: 06/03/14 Potential to Achieve Goals: Good    Frequency Min 5X/week    End of Session   Activity Tolerance: Patient limited by pain;Patient limited by fatigue Patient left: in chair;with call bell/phone within reach;with chair alarm set Nurse Communication: Mobility status         Time: 1353-1447 PT Time Calculation (min) (ACUTE ONLY): 54 min   Charges:   PT Evaluation $Initial PT Evaluation Tier I: 1 Procedure PT Treatments $Therapeutic Activity: 38-52 mins        Lamonica Trueba B. [place], [place], DPT [phone]   05/27/2014, 3:07 PM

## 2014-05-28 ENCOUNTER — Telehealth: Payer: Self-pay | Admitting: Pulmonary Disease

## 2014-05-28 DIAGNOSIS — E876 Hypokalemia: Secondary | ICD-10-CM

## 2014-05-28 DIAGNOSIS — E41 Nutritional marasmus: Secondary | ICD-10-CM

## 2014-05-28 DIAGNOSIS — Z9660 Presence of unspecified orthopedic joint implant: Secondary | ICD-10-CM

## 2014-05-28 DIAGNOSIS — E43 Unspecified severe protein-calorie malnutrition: Secondary | ICD-10-CM | POA: Insufficient documentation

## 2014-05-28 DIAGNOSIS — Z96649 Presence of unspecified artificial hip joint: Secondary | ICD-10-CM | POA: Insufficient documentation

## 2014-05-28 DIAGNOSIS — J432 Centrilobular emphysema: Secondary | ICD-10-CM

## 2014-05-28 DIAGNOSIS — R042 Hemoptysis: Secondary | ICD-10-CM

## 2014-05-28 LAB — CBC
HCT: 26.9 % — ABNORMAL LOW (ref 36.0–46.0)
Hemoglobin: 8.2 g/dL — ABNORMAL LOW (ref 12.0–15.0)
MCH: 24.6 pg — AB (ref 26.0–34.0)
MCHC: 30.5 g/dL (ref 30.0–36.0)
MCV: 80.8 fL (ref 78.0–100.0)
PLATELETS: 296 10*3/uL (ref 150–400)
RBC: 3.33 MIL/uL — ABNORMAL LOW (ref 3.87–5.11)
RDW: 17.4 % — AB (ref 11.5–15.5)
WBC: 9.1 10*3/uL (ref 4.0–10.5)

## 2014-05-28 LAB — BASIC METABOLIC PANEL
ANION GAP: 8 (ref 5–15)
BUN: 9 mg/dL (ref 6–23)
CALCIUM: 8.3 mg/dL — AB (ref 8.4–10.5)
CO2: 25 mmol/L (ref 19–32)
Chloride: 103 mmol/L (ref 96–112)
Creatinine, Ser: 1.17 mg/dL — ABNORMAL HIGH (ref 0.50–1.10)
GFR calc Af Amer: 45 mL/min — ABNORMAL LOW (ref >90)
GFR, EST NON AFRICAN AMERICAN: 39 mL/min — AB (ref >90)
Glucose, Bld: 107 mg/dL — ABNORMAL HIGH (ref 70–99)
Potassium: 4.3 mmol/L (ref 3.5–5.1)
Sodium: 136 mmol/L (ref 135–145)

## 2014-05-28 MED ORDER — ENOXAPARIN SODIUM 30 MG/0.3ML ~~LOC~~ SOLN: 30.0000 mg | Freq: Every day | SUBCUTANEOUS | Status: DC

## 2014-05-28 MED ORDER — DIPHENHYDRAMINE-ZINC ACETATE 2-0.1 % EX CREA: TOPICAL_CREAM | Freq: Three times a day (TID) | CUTANEOUS | Status: AC | PRN

## 2014-05-28 MED ORDER — BENZONATATE 100 MG PO CAPS: 100.0000 mg | ORAL_CAPSULE | Freq: Three times a day (TID) | ORAL | Status: DC | PRN

## 2014-05-28 MED ORDER — FERROUS SULFATE 325 (65 FE) MG PO TABS: 325.0000 mg | ORAL_TABLET | Freq: Three times a day (TID) | ORAL | Status: AC

## 2014-05-28 MED ORDER — BOOST / RESOURCE BREEZE PO LIQD: 1.0000 | Freq: Three times a day (TID) | ORAL | Status: AC

## 2014-05-28 NOTE — Progress Notes (Signed)
Report called to [facility].PTAR called D/C summary faxed to [facility]

## 2014-05-28 NOTE — Discharge Instructions (Signed)
WBAT RLE Keep surgical dressing on for 2 weeks May shower/bathe with dressing on as it is water proof Call with questions and for appointment, [phone]

## 2014-05-28 NOTE — Care Management Note (Signed)
CARE MANAGEMENT NOTE 05/28/2014  Patient:  [name], [name]   Account Number:  [id number]  Date Initiated:  05/28/2014  Documentation initiated by:  Ricki Miller  Subjective/Objective Assessment:   81 yr old female admitted with a right hip fracture. patient underwent a right total hip arthroplasty.     Action/Plan:   Patient will require shortterm rehab at [facility]. Patient will go to [facility] SNF. Social worker has arranged.   Anticipated DC Date:  05/28/2014   Anticipated DC Plan:  [place]  CM consult      PAC Choice  NA   Choice offered to / List presented to:     DME arranged  NA        [place] arranged  NA      Status of service:  Completed, signed off Medicare Important Message given?  YES (If response is "NO", the following Medicare IM given date fields will be blank) Date Medicare IM given:  05/28/2014 Medicare IM given by:  Ricki Miller Date Additional Medicare IM given:   Additional Medicare IM given by:    Discharge Disposition:  [place]  Per UR Regulation:  Reviewed for med. necessity/level of care/duration of stay  If discussed at [place] of Stay Meetings, dates discussed:    Comments:

## 2014-05-28 NOTE — Progress Notes (Signed)
Patient ID: [name], female   DOB: 1919/05/12, 81 y.o.   MRN: [id number] Subjective: 2 Days Post-Op Procedure(s) (LRB): ARTHROPLASTY BIPOLAR HIP (Right)    Patient reports pain as moderate.  Still with some confusion post-operatively but re-orients.   On 4L O2 with sats 91%  Objective:   VITALS:   Filed Vitals:   05/28/14 0601  BP: 118/51  Pulse: 80  Temp: 99.5 F (37.5 C)  Resp: 16    Neurovascular intact Incision: dressing C/D/I  LABS  Recent Labs  05/25/14 1900 05/26/14 0625 05/27/14 0525  HGB 9.6* 9.1* 8.6*  HCT 31.6* 30.0* 28.2*  WBC 10.0 7.9 8.4  PLT 337 302 304     Recent Labs  05/26/14 0625 05/26/14 0900 05/27/14 0525  NA 139 139 136  K 3.2* 3.0* 3.5  BUN 13 12 11   CREATININE 1.21* 1.25* 1.07  GLUCOSE 94 105* 91     Recent Labs  05/25/14 1900  INR 1.15     Assessment/Plan: 2 Days Post-Op Procedure(s) (LRB): ARTHROPLASTY BIPOLAR HIP (Right)   Up with therapy Discharge to SNF when stable Hospice involved in care Incentive spirometry to help with lung capacity

## 2014-05-28 NOTE — Care Management Note (Signed)
05/28/14 2:28pm Ricki Miller, RN BSN Case Manager 2nd IM message given.

## 2014-05-28 NOTE — Progress Notes (Signed)
Inpatient RN visit- [name] [facility]) RN Visit - Barbra Sarks RN  Related admission to [facility] diagnosis of Lung Cancer. Pt. Is DNR Code. Patient sitting up in recliner in room with son and paid CG present. Patient denies any pain at present. Patient has received one PRN  dose of Vicodin this morning for hip pain per chart review. Physical Therapy came in earlier and assisted pt.to the recliner.  Patient is to discharge to [facility] later today via non-emergency transportation after Pulmonary consult per family request. Patient scratching forehead and chin and son and CG advised that  Benadryl cream is available. Patient's home medication list, transfer summary and OOF DNR in place on shadow chart.  Please call [facility] @ [phone] with any hospice needs. Barbra Sarks RN

## 2014-05-28 NOTE — Discharge Summary (Signed)
Discharge Summary  [name] TIW:[id number] DOB: 01-22-1920  PCP: Kandice Hams, MD  Admit date: 05/25/2014 Discharge date: 05/28/2014  Time spent: >41mins  Recommendations for Outpatient Follow-up:  1. F/u with pulmonology for lung CA/chronic hemoptysis 2. F/u with pcp, pcp to repeat cbc/bmp in three weeks. 3. F/u with orthopedics, s/p right hip hemiarthorplasty 4.   continue hospice care at [facility]  Discharge Diagnoses:  Active Hospital Problems   Diagnosis Date Noted  . Subcapital fracture of right hip 05/25/2014  . Hypokalemia   . S/P hip hemiarthroplasty   . Severe malnutrition   . Protein-calorie malnutrition, severe 05/26/2014  . Hip fracture 05/25/2014  . Lung mass   . COLD (chronic obstructive lung disease)   . Atrial fibrillation 11/19/2013  . Essential hypertension 02/02/2013    Resolved Hospital Problems   Diagnosis Date Noted Date Resolved  No resolved problems to display.    Discharge Condition: stable  Diet recommendation:  Heart healthy  Filed Weights   05/25/14 1824  Weight: 65.772 kg (145 lb)    History of present illness:  [name] is a 81 y.o. female presents with a right hip fracture. Patient has a lung mass which is suspected to be malignant however she does not want any workup to be done for this. She apparently had been complaining about pain in the right hip for some time now. She states that she did not fall. She had no trauma to the hip. Patient is normally ambulatory and very active. She went to her PCP and was told to get further evaluation. On radiological studies it was found that she has a right hip fracture.   Hospital Course:  Principal Problem:   Subcapital fracture of right hip Active Problems:   Essential hypertension   Atrial fibrillation   Lung mass   COLD (chronic obstructive lung disease)   Hip fracture   Protein-calorie malnutrition, severe   Hypokalemia   S/P hip hemiarthroplasty   Severe  malnutrition  right hip fracture, without known injury prior to fracture. s/p Right hip hemiarthroplasty on 3/2, ortho following,prn pain meds. Pt/ot. Ortho recommended asa 325 bid for 4wks for postop DVT prophylaxis.  Hypokalemia: k normalized, Mag wnl, cr better. D/c home meds lasix, s/p oral k supplement  Chronic afib, rate controlled. Not a candidate for anticoagualtion.  CKD III at baseline  lung mass with chronic hemoptysis, patient declined further work up. No respiratory distress. Does has chronic hemoptysis, h/h stable. Family requested patient to be seem by pulmonologist before discharge, pulmonology consulted, will see patient prior to discharge. Note that patient is started on asax4wks bid post op, should hemoptysis worsen or h/h drop, consider decrease asa or stop asa early.  Severe mal nutrition: nutrition supplement   Code Status: DNR  Consultants:  orthopedics  Procedures:  Right Hip hemiarthroplasty  Antibiotics:  none   Discharge Exam: BP 118/51 mmHg  Pulse 80  Temp(Src) 99.5 F (37.5 C) (Oral)  Resp 16  Ht 5\' 4"  (1.626 m)  Wt 65.772 kg (145 lb)  BMI 24.88 kg/m2  SpO2 96%   General: AAox3, NAD  Cardiovascular: IRRR, 3/6 systolic murmur at right upper sternal border  Respiratory: decreased at bases, no wheezing/rales/rhonchi  Abdomen: soft/Nd/ND, positive bowel sounds  Musculoskeletal: right hip post op changes  Neuro: AAOx3  Reported chronic itchy rash on forehead and back    Discharge Instructions You were cared for by a hospitalist during your hospital stay. If you have any questions about your discharge  medications or the care you received while you were in the hospital after you are discharged, you can call the unit and asked to speak with the hospitalist on call if the hospitalist that took care of you is not available. Once you are discharged, your primary care physician will handle any further medical issues. Please note that NO  REFILLS for any discharge medications will be authorized once you are discharged, as it is imperative that you return to your primary care physician (or establish a relationship with a primary care physician if you do not have one) for your aftercare needs so that they can reassess your need for medications and monitor your lab values.      Discharge Instructions    Diet - low sodium heart healthy    Complete by:  As directed      Increase activity slowly    Complete by:  As directed      Partial weight bearing    Complete by:  As directed   % Body Weight:  50  Laterality:  right  Extremity:  Lower            Medication List    STOP taking these medications        furosemide 40 MG tablet  Commonly known as:  LASIX     levofloxacin 500 MG tablet  Commonly known as:  LEVAQUIN      TAKE these medications        acetaminophen 325 MG tablet  Commonly known as:  TYLENOL  Take 650 mg by mouth every 6 (six) hours as needed for mild pain.     acyclovir ointment 5 %  Commonly known as:  ZOVIRAX  Apply 1 application topically every 3 (three) hours as needed. For cold sores     albuterol 108 (90 BASE) MCG/ACT inhaler  Commonly known as:  PROVENTIL HFA;VENTOLIN HFA  Inhale 2 puffs into the lungs every 6 (six) hours as needed for wheezing or shortness of breath.     albuterol (2.5 MG/3ML) 0.083% nebulizer solution  Commonly known as:  PROVENTIL  Take 3 mLs (2.5 mg total) by nebulization every 2 (two) hours as needed for wheezing.     aspirin EC 325 MG tablet  Take 1 tablet (325 mg total) by mouth 2 (two) times daily. Take for 4 weeks.     augmented betamethasone dipropionate 0.05 % ointment  Commonly known as:  DIPROLENE-AF  Apply 1 application topically 2 (two) times daily.     benzonatate 200 MG capsule  Commonly known as:  TESSALON  Take 1 capsule (200 mg total) by mouth 3 (three) times daily as needed for cough.     betamethasone dipropionate 0.05 % cream  Commonly  known as:  DIPROLENE  Apply 1 application topically 2 (two) times daily.     CERAVE Crea  Apply 1 application topically daily. Applies to dry skin.     cetirizine 10 MG tablet  Commonly known as:  ZYRTEC  Take 10 mg by mouth daily.     chlorhexidine 0.12 % solution  Commonly known as:  PERIDEX  Use as directed 15 mLs in the mouth or throat. swish 15 mls for 30 sec then spit, twice daily     chlorpheniramine-HYDROcodone 10-8 MG/5ML Lqcr  Commonly known as:  TUSSIONEX  Take 5 mLs by mouth every 12 (twelve) hours as needed for cough.     CLODERM 0.1 % cream  Generic drug:  Clocortolone Pivalate  Apply  1 application topically 2 (two) times daily. Applies to lesion on left cheek.     coal tar 0.5 % shampoo  Commonly known as:  NEUTROGENA T-GEL  Apply 1 application topically See admin instructions. She uses on Tuesday and Friday.     diphenhydrAMINE-zinc acetate cream  Commonly known as:  BENADRYL  Apply topically 3 (three) times daily as needed for itching.     donepezil 10 MG tablet  Commonly known as:  ARICEPT  Take 10 mg by mouth at bedtime.     escitalopram 20 MG tablet  Commonly known as:  LEXAPRO  Take 20 mg by mouth every morning.     feeding supplement (RESOURCE BREEZE) Liqd  Take 1 Container by mouth 3 (three) times daily between meals.     ferrous sulfate 325 (65 FE) MG tablet  Take 1 tablet (325 mg total) by mouth 3 (three) times daily after meals.     fluticasone 50 MCG/ACT nasal spray  Commonly known as:  FLONASE  Place 2 sprays into both nostrils daily.     gabapentin 100 MG capsule  Commonly known as:  NEURONTIN  Take 200 mg by mouth at bedtime.     HYDROcodone-acetaminophen 5-325 MG per tablet  Commonly known as:  NORCO  Take 1-2 tablets by mouth every 4 (four) hours as needed for moderate pain.     hydrOXYzine 25 MG tablet  Commonly known as:  ATARAX/VISTARIL  Take 25 mg by mouth at bedtime.     levothyroxine 88 MCG tablet  Commonly known as:   SYNTHROID, LEVOTHROID  Take 88 mcg by mouth daily before breakfast.     lidocaine 5 %  Commonly known as:  LIDODERM  Place 1 patch onto the skin daily. Remove & Discard patch within 12 hours or as directed by MD     LORazepam 2 MG/ML concentrated solution  Commonly known as:  ATIVAN  Take 0.5 mLs (1 mg total) by mouth every 6 (six) hours as needed for anxiety or sedation.     magic mouthwash Soln  Take 10 mLs by mouth 3 (three) times daily.     magic mouthwash w/lidocaine Soln  Take 5 mLs by mouth 4 (four) times daily as needed for mouth pain.     morphine CONCENTRATE 10 mg / 0.5 ml concentrated solution  Take 0.25 mLs (5 mg total) by mouth every 4 (four) hours as needed for severe pain, anxiety or shortness of breath.     olopatadine 0.1 % ophthalmic solution  Commonly known as:  PATANOL  Place 1 drop into both eyes 2 (two) times daily as needed. For dry eyes     polyethylene glycol packet  Commonly known as:  MIRALAX / GLYCOLAX  Take 17 g by mouth every morning.     pramoxine-mineral oil-zinc 1-12.5 % rectal ointment  Commonly known as:  TUCKS  Place 1 application rectally every 2 (two) hours as needed for itching (itching).     pravastatin 40 MG tablet  Commonly known as:  PRAVACHOL  Take 1 tablet (40 mg total) by mouth every evening.     PRESERVISION AREDS Caps  Take 1 capsule by mouth 2 (two) times daily with a meal.     SENOKOT S 8.6-50 MG per tablet  Generic drug:  senna-docusate  Take 1-2 tablets by mouth See admin instructions. She takes one tablet in the morning and two tablets at bedtime.     SYSTANE BALANCE 0.6 % Soln  Generic drug:  Propylene Glycol  Place 1 drop into both eyes daily as needed (For dry or itchy eyes.). One drop both eye as needed     tiotropium 18 MCG inhalation capsule  Commonly known as:  SPIRIVA  Place 18 mcg into inhaler and inhale daily.      ASK your doctor about these medications        amLODipine 10 MG tablet  Commonly known  as:  NORVASC  Take 0.5 tablets (5 mg total) by mouth daily.     amoxicillin 500 MG capsule  Commonly known as:  AMOXIL  Take 2,000 mg by mouth once. Prior to dental procedure       Allergies  Allergen Reactions  . Cortisone Other (See Comments)    Rash   . Minocin [Minocycline Hcl] Other (See Comments)    Unknown  . Minocycline Hcl     unknown  . Neomycin-Bacitracin Zn-Polymyx     unknown  . Neosporin [Neomycin-Bacitracin Zn-Polymyx] Other (See Comments)    Unknown  . Other Other (See Comments)    Steroidal Neuromuscular Blockers  . Tetanus Toxoids Other (See Comments)    Unknown   Follow-up Information    Follow up with Mauri Pole, MD. Schedule an appointment as soon as possible for a visit in 2 weeks.   Specialty:  Orthopedic Surgery   Contact information:   [address] [place] [address] [phone]       Follow up with Kandice Hams, MD In 3 weeks.   Specialty:  Internal Medicine   Contact information:   [address] [place] [address] [phone]       Follow up with Chesley Mires, MD On 07/22/2014.   Specialty:  Pulmonary Disease   Why:  Appt at 1130 AM.  Office may call you for an appointment sooner.     Contact information:   [address] [place] [address] [phone]        The results of significant diagnostics from this hospitalization (including imaging, microbiology, ancillary and laboratory) are listed below for reference.    Significant Diagnostic Studies: Pelvis Portable  05/26/2014   CLINICAL DATA:  Post RIGHT hip hemiarthroplasty  EXAM: PORTABLE PELVIS 1-2 VIEWS  COMPARISON:  Portable exam 1912 hours compared to preoperative images of 05/25/2014  FINDINGS: Interval resection of RIGHT femoral head/neck and placement of a femoral prosthesis.  Bones demineralized.  No acute fracture dislocation.  IMPRESSION: RIGHT hip prosthesis without acute complication.   Electronically Signed   By:  Lavonia Dana M.D.   On: 05/26/2014 19:31   Dg Chest Portable 1 View  05/25/2014   CLINICAL DATA:  Hip fracture.  Hip injury.  EXAM: PORTABLE CHEST - 1 VIEW  COMPARISON:  01/28/2014.  FINDINGS: Cardiopericardial silhouette upper limits of normal for projection. Aortic arch atherosclerosis. Fluid level is present in the inferior LEFT chest, compatible with moderate hiatal hernia and gastric fluid level. RIGHT basilar perihilar airspace opacity which may represent edema, aspiration, or pneumonia. This extends to the RIGHT costophrenic angle.  Monitoring leads project over the chest. No focal consolidation in the LEFT lung.  IMPRESSION: 1. RIGHT infrahilar and perihilar airspace disease may represent asymmetric pulmonary edema, aspiration or pneumonia. 2. Moderate hiatal hernia with fluid level in the LEFT chest.   Electronically Signed   By: Dereck Ligas M.D.   On: 05/25/2014 20:04   Dg Hip Unilat With Pelvis 2-3 Views Right  05/25/2014   CLINICAL DATA:  One  week history of right hip pain without known trauma  EXAM: RIGHT HIP (WITH PELVIS) 2-3 VIEWS  COMPARISON:  Pelvis and left hip of March 29, 2014  FINDINGS: There is a mildly impacted fracture of the neck of the right femoral head. The femoral head remains appropriately related to the acetabulum. The intertrochanteric region of the right femur is intact. The bony pelvis is osteopenic. There is no lytic or blastic lesion nor acute fracture. There are degenerative changes of the lower lumbar spine. The observed portions of the sacrum are unremarkable. The left hip is unremarkable.  IMPRESSION: 1. There is an acute impacted slightly angulated subcapital fracture of the right hip. 2. There is no acute fracture of the bony pelvis. 3. These results will be called to the ordering clinician or representative by the Radiologist Assistant, and communication documented in the PACS or zVision Dashboard.   Electronically Signed   By: David  Martinique   On: 05/25/2014 16:21      Microbiology: Recent Results (from the past 240 hour(s))  Surgical pcr screen     Status: None   Collection Time: 05/26/14  3:44 PM  Result Value Ref Range Status   MRSA, PCR NEGATIVE NEGATIVE Final   Staphylococcus aureus NEGATIVE NEGATIVE Final    Comment:        The Xpert SA Assay (FDA approved for NASAL specimens in patients over 29 years of age), is one component of a comprehensive surveillance program.  Test performance has been validated by [facility] for patients greater than or equal to 42 year old. It is not intended to diagnose infection nor to guide or monitor treatment.      Labs: Basic Metabolic Panel:  Recent Labs Lab 05/25/14 1900 05/26/14 0625 05/26/14 0900 05/27/14 0525 05/28/14 0602  NA 138 139 139 136 136  K 2.9* 3.2* 3.0* 3.5 4.3  CL 99 102 103 101 103  CO2 28 29 32 27 25  GLUCOSE 116* 94 105* 91 107*  BUN 14 13 12 11 9   CREATININE 1.46* 1.21* 1.25* 1.07 1.17*  CALCIUM 8.6 8.4 8.6 8.3* 8.3*  MG  --   --  2.1  --   --    Liver Function Tests: No results for input(s): AST, ALT, ALKPHOS, BILITOT, PROT, ALBUMIN in the last 168 hours. No results for input(s): LIPASE, AMYLASE in the last 168 hours. No results for input(s): AMMONIA in the last 168 hours. CBC:  Recent Labs Lab 05/25/14 1900 05/26/14 0625 05/27/14 0525 05/28/14 0602  WBC 10.0 7.9 8.4 9.1  HGB 9.6* 9.1* 8.6* 8.2*  HCT 31.6* 30.0* 28.2* 26.9*  MCV 80.6 81.3 80.6 80.8  PLT 337 302 304 296   Cardiac Enzymes: No results for input(s): CKTOTAL, CKMB, CKMBINDEX, TROPONINI in the last 168 hours. BNP: BNP (last 3 results) No results for input(s): BNP in the last 8760 hours.  ProBNP (last 3 results) No results for input(s): PROBNP in the last 8760 hours.  CBG: No results for input(s): GLUCAP in the last 168 hours.     Signed:  Sayed Apostol  [facility] 05/28/2014, 4:44 PM

## 2014-05-28 NOTE — Progress Notes (Signed)
Physical Therapy Treatment Patient Details Name: [name] MRN: [id number] DOB: [birth year] Today's Date: 05/28/2014    History of Present Illness 81 y.o. female admitted to [facility] on 05/25/14 s/p fall at her ALF with right hip fx.  She underwent R hip hemiarthroplasty and is now PWB 50% with posterior hip precautions.     PT Comments    Pt was able, with two person assist, to take some pivotal movements/steps with RW to [facility] and recliner chair.  She fatigues quickly and does not have enough upper extremity strength to un weight her legs to take steps or maintaining PWB 50% during attempts at walking.  She continues to be appropriate for SNF level rehab at discharge.   Follow Up Recommendations  SNF     Equipment Recommendations  None recommended by PT    Recommendations for Other Services   NA     Precautions / Restrictions Precautions Precautions: Posterior Hip (not ordered, but posterior approach in op note) Precaution Comments: Reviewed posterior hip precautions and PWB 50% with her caregiver.  Restrictions RLE Weight Bearing: Partial weight bearing RLE Partial Weight Bearing Percentage or Pounds: 50    Mobility  Bed Mobility Overal bed mobility: +2 for physical assistance;Needs Assistance Bed Mobility: Supine to Sit     Supine to sit: +2 for physical assistance;Max assist     General bed mobility comments: Two person max assist to support trunk to get to sitting EOB.  Pt was able to help progress her legs and hips to EOB, but had significant difficulty getting her trunk up to sitting even with upper extremity pulling bil.    Transfers Overall transfer level: Needs assistance Equipment used: Rolling walker (2 wheeled) Transfers: Sit to/from [facility] Sit to Stand: +2 physical assistance;Max assist Stand pivot transfers: +2 safety/equipment;Max assist       General transfer comment: Two person max assist to support trunk and use bed pad to  support and progress hips into extension during transition to stand. Pt stood x 2 from bed and then [facility] with verbal cues for hand placement.  Pt has significant difficulty moving feet (especially without twisting her right leg) to pivot to the [facility] and then the recliner with two person assist at trunk, to weight shfit and to attempt to help her move her feet.   Ambulation/Gait             General Gait Details: unable at this time.        Balance Overall balance assessment: Needs assistance Sitting-balance support: Feet supported;Bilateral upper extremity supported Sitting balance-Leahy Scale: Poor Sitting balance - Comments: min assist Postural control: Posterior lean Standing balance support: Bilateral upper extremity supported Standing balance-Leahy Scale: Zero Standing balance comment: two person max assist with RW                    Cognition Arousal/Alertness: Lethargic Behavior During Therapy: WFL for tasks assessed/performed Overall Cognitive Status: History of cognitive impairments - at baseline                      Exercises Total Joint Exercises Ankle Circles/Pumps: AAROM;Right;10 reps;Supine Heel Slides: AAROM;Right;10 reps;Supine Hip ABduction/ADduction: AAROM;Right;10 reps;Supine Long Arc Quad: AROM;AAROM;Both;10 reps;Seated        Pertinent Vitals/Pain Pain Assessment: Faces Faces Pain Scale: Hurts whole lot Pain Location: right hip Pain Descriptors / Indicators: Aching;Burning Pain Intervention(s): Limited activity within patient's tolerance;Monitored during session;Repositioned;RN gave pain meds during session  PT Goals (current goals can now be found in the care plan section) Acute Rehab PT Goals Patient Stated Goal: to get back on her feet, decrease pain Progress towards PT goals: Progressing toward goals    Frequency  Min 5X/week    PT Plan Current plan remains appropriate       End of Session Equipment Utilized  During Treatment: Gait belt Activity Tolerance: Patient limited by pain;Patient limited by fatigue Patient left: in chair;with call bell/phone within reach;with chair alarm set     Time: [id number] PT Time Calculation (min) (ACUTE ONLY): 42 min  Charges:  $Therapeutic Activity: 23-37 mins                      Stpehen Petitjean B. [place], [place], DPT [phone]   05/28/2014, 12:03 PM

## 2014-05-28 NOTE — Telephone Encounter (Signed)
Dr. Halford Chessman did not have any appointments available for hospital follow up.  Called Marylyn Ishihara and advised him that we could schedule patient with TP.  Scheduled on 3/23.  Nothing further needed.

## 2014-05-28 NOTE — Consult Note (Signed)
Name: [name] MRN: [id number] DOB: May 22, 1919    ADMISSION DATE:  05/25/2014   CONSULTATION DATE:  05/28/14  REFERRING MD :  Dr. Erlinda Hong   CHIEF COMPLAINT:  Known lung mass.  Family requested consult.    BRIEF PATIENT DESCRIPTION: 81 y/o F admitted for repair of R hip fracture.  She has a known RLL lung mass that she does not want treatment for and has had prior episodes of hemoptysis with mass.    SIGNIFICANT EVENTS  3/2 ORIF R hip  STUDIES:  CXR 3/1 >> no significant change in RLL lung mass   HISTORY OF PRESENT ILLNESS:  81 y/o female who was recently admitted to our hospital in 2015 returned this month for a right hip fracture.  She underwent an ORIF on 3/2 and has done well post operattively.  The family requested pulmonary involvement before discharge as she missed her appointment with Dr. Halford Chessman this week because of the injury. She notes that at home she has been coughing up blood fairly consistently since hospital discharge in 01/2014.  She notes coughing up dark clots particularly with a bath or shower in the morning.  No real dyspnea except in the midst of a coughing spell.  No chest pain to speak of.  Currently she has a significant amount of operative related hip pain.  PAST MEDICAL HISTORY :   has a past medical history of Cholelithiases; Hypothyroid; Leg paresthesia; Aortic valve stenosis; Arthritis; Hyperlipidemia; Dementia; Carotid artery occlusion; Renal disorder; Hypertension; COPD (chronic obstructive pulmonary disease); SEIZURE DISORDER (11/05/2006); GERD (11/05/2006); CVA (02/07/2007); Macular degeneration of both eyes; Blind in both eyes; Peripheral neuropathy; Pneumonia; Internal hemorrhoid, bleeding; Malaria; Skin cancer; Chronic lower back pain; Intestinal infection due to Clostridium difficile (09/2011); Benign neoplasm of breast; Gout, unspecified; Anemia; Anxiety; Other dysfunctions of sleep stages or arousal from sleep; Depression; Chronic rhinitis; Abscess of  lung(513.0); Diaphragmatic hernia without mention of obstruction or gangrene; Diverticulosis of colon (without mention of hemorrhage); Acute duodenal ulcer with perforation and obstruction (08/2011); Hemorrhage of rectum and anus; Abscess of intestine (09/2011); Other psoriasis; Lumbago; Nonspecific elevation of levels of transaminase or lactic acid dehydrogenase (LDH); Solitary pulmonary nodule; Transient ischemic attack (TIA), and cerebral infarction without residual deficits(V12.54); Long term (current) use of anticoagulants; Sacroiliitis (08/27/2012); Anemia, iron deficiency (10/16/2011); Congestive heart failure; Spondylosis of lumbosacral region (09/24/2012); Dysrhythmia; Arteritis; and Aortic stenosis, severe (03/02/2013).  has past surgical history that includes Vesicovaginal fistula closure w/ TAH; Tonsillectomy; Breast lumpectomy; Cataract extraction w/ intraocular lens  implant, bilateral; Skin cancer excision; Abdominal hysterectomy; Hemorrhoid surgery (11/2008); Hip surgery (Right, 05/26/2014); and Hip Arthroplasty (Right, 05/26/2014).    HOME MEDICATIONS:  Prior to Admission medications   Medication Sig Start Date End Date Taking? Authorizing Provider  acetaminophen (TYLENOL) 325 MG tablet Take 650 mg by mouth every 6 (six) hours as needed for mild pain.    Yes Historical Provider, MD  acyclovir ointment (ZOVIRAX) 5 % Apply 1 application topically every 3 (three) hours as needed. For cold sores   Yes Historical Provider, MD  albuterol (PROVENTIL HFA;VENTOLIN HFA) 108 (90 BASE) MCG/ACT inhaler Inhale 2 puffs into the lungs every 6 (six) hours as needed for wheezing or shortness of breath.   Yes Historical Provider, MD  albuterol (PROVENTIL) (2.5 MG/3ML) 0.083% nebulizer solution Take 3 mLs (2.5 mg total) by nebulization every 2 (two) hours as needed for wheezing. 01/29/14  Yes Shanker Kristeen Mans, MD  Alum & Mag Hydroxide-Simeth (MAGIC MOUTHWASH W/LIDOCAINE) SOLN Take 5 mLs  by mouth 4 (four) times daily as  needed for mouth pain. 01/29/14  Yes Shanker Kristeen Mans, MD  Alum & Mag Hydroxide-Simeth (MAGIC MOUTHWASH) SOLN Take 10 mLs by mouth 3 (three) times daily.   Yes Historical Provider, MD  amLODipine (NORVASC) 10 MG tablet Take 0.5 tablets (5 mg total) by mouth daily. 01/29/14  Yes Shanker Kristeen Mans, MD  amoxicillin (AMOXIL) 500 MG capsule Take 2,000 mg by mouth once. Prior to dental procedure   Yes Historical Provider, MD  augmented betamethasone dipropionate (DIPROLENE-AF) 0.05 % ointment Apply 1 application topically 2 (two) times daily.   Yes Historical Provider, MD  benzonatate (TESSALON) 200 MG capsule Take 1 capsule (200 mg total) by mouth 3 (three) times daily as needed for cough. 01/29/14  Yes Shanker Kristeen Mans, MD  betamethasone dipropionate (DIPROLENE) 0.05 % cream Apply 1 application topically 2 (two) times daily.   Yes Historical Provider, MD  chlorhexidine (PERIDEX) 0.12 % solution Use as directed 15 mLs in the mouth or throat. swish 15 mls for 30 sec then spit, twice daily   Yes Historical Provider, MD  chlorpheniramine-HYDROcodone (TUSSIONEX) 10-8 MG/5ML LQCR Take 5 mLs by mouth every 12 (twelve) hours as needed for cough. 01/29/14  Yes Shanker Kristeen Mans, MD  donepezil (ARICEPT) 10 MG tablet Take 10 mg by mouth at bedtime.    Yes Historical Provider, MD  escitalopram (LEXAPRO) 20 MG tablet Take 20 mg by mouth every morning.    Yes Historical Provider, MD  fluticasone (FLONASE) 50 MCG/ACT nasal spray Place 2 sprays into both nostrils daily.   Yes Historical Provider, MD  furosemide (LASIX) 40 MG tablet Take 1 tablet (40 mg total) by mouth every morning. 11/07/13  Yes Eugenie Filler, MD  gabapentin (NEURONTIN) 100 MG capsule Take 200 mg by mouth at bedtime.    Yes Historical Provider, MD  hydrOXYzine (ATARAX/VISTARIL) 25 MG tablet Take 25 mg by mouth at bedtime.   Yes Historical Provider, MD  levothyroxine (SYNTHROID, LEVOTHROID) 88 MCG tablet Take 88 mcg by mouth daily before breakfast.     Yes Historical Provider, MD  lidocaine (LIDODERM) 5 % Place 1 patch onto the skin daily. Remove & Discard patch within 12 hours or as directed by MD 01/29/14  Yes Shanker Kristeen Mans, MD  LORazepam (ATIVAN) 2 MG/ML concentrated solution Take 0.5 mLs (1 mg total) by mouth every 6 (six) hours as needed for anxiety or sedation. 01/29/14  Yes Shanker Kristeen Mans, MD  Morphine Sulfate (MORPHINE CONCENTRATE) 10 mg / 0.5 ml concentrated solution Take 0.25 mLs (5 mg total) by mouth every 4 (four) hours as needed for severe pain, anxiety or shortness of breath. 01/29/14  Yes Shanker Kristeen Mans, MD  Multiple Vitamins-Minerals (PRESERVISION AREDS) CAPS Take 1 capsule by mouth 2 (two) times daily with a meal.    Yes Historical Provider, MD  olopatadine (PATANOL) 0.1 % ophthalmic solution Place 1 drop into both eyes 2 (two) times daily as needed. For dry eyes   Yes Historical Provider, MD  polyethylene glycol (MIRALAX / GLYCOLAX) packet Take 17 g by mouth every morning.    Yes Historical Provider, MD  pramoxine-mineral oil-zinc (TUCKS) 1-12.5 % rectal ointment Place 1 application rectally every 2 (two) hours as needed for itching (itching).   Yes Historical Provider, MD  pravastatin (PRAVACHOL) 40 MG tablet Take 1 tablet (40 mg total) by mouth every evening. 03/02/13  Yes Lelon Perla, MD  Propylene Glycol (SYSTANE BALANCE) 0.6 % SOLN Place 1 drop  into both eyes daily as needed (For dry or itchy eyes.). One drop both eye as needed   Yes Historical Provider, MD  senna-docusate (SENOKOT S) 8.6-50 MG per tablet Take 1-2 tablets by mouth See admin instructions. She takes one tablet in the morning and two tablets at bedtime.   Yes Historical Provider, MD  tiotropium (SPIRIVA) 18 MCG inhalation capsule Place 18 mcg into inhaler and inhale daily.   Yes Historical Provider, MD  aspirin EC 325 MG tablet Take 1 tablet (325 mg total) by mouth 2 (two) times daily. Take for 4 weeks. 05/26/14 07/14/14  Lucille Passy Babish, PA-C    cetirizine (ZYRTEC) 10 MG tablet Take 10 mg by mouth daily.    Historical Provider, MD  Clocortolone Pivalate (CLODERM) 0.1 % cream Apply 1 application topically 2 (two) times daily. Applies to lesion on left cheek.    Historical Provider, MD  coal tar (NEUTROGENA T-GEL) 0.5 % shampoo Apply 1 application topically See admin instructions. She uses on Tuesday and Friday.    Historical Provider, MD  diphenhydrAMINE-zinc acetate (BENADRYL) cream Apply topically 3 (three) times daily as needed for itching. 05/28/14   Florencia Reasons, MD  Emollient (CERAVE) CREA Apply 1 application topically daily. Applies to dry skin.    Historical Provider, MD  feeding supplement, RESOURCE BREEZE, (RESOURCE BREEZE) LIQD Take 1 Container by mouth 3 (three) times daily between meals. 05/28/14   Florencia Reasons, MD  ferrous sulfate 325 (65 FE) MG tablet Take 1 tablet (325 mg total) by mouth 3 (three) times daily after meals. 05/28/14   Florencia Reasons, MD  HYDROcodone-acetaminophen (NORCO) 5-325 MG per tablet Take 1-2 tablets by mouth every 4 (four) hours as needed for moderate pain. 05/26/14   Lucille Passy Babish, PA-C  levofloxacin (LEVAQUIN) 500 MG tablet Take 1 tablet (500 mg total) by mouth daily. Take for 5 more days from 01/29/14 Patient not taking: Reported on 05/25/2014 01/29/14   Jonetta Osgood, MD   Allergies  Allergen Reactions  . Cortisone Other (See Comments)    Rash   . Minocin [Minocycline Hcl] Other (See Comments)    Unknown  . Minocycline Hcl     unknown  . Neomycin-Bacitracin Zn-Polymyx     unknown  . Neosporin [Neomycin-Bacitracin Zn-Polymyx] Other (See Comments)    Unknown  . Other Other (See Comments)    Steroidal Neuromuscular Blockers  . Tetanus Toxoids Other (See Comments)    Unknown    FAMILY HISTORY:  family history includes Colon cancer in her daughter; Heart attack in her father; Heart disease in her son; Hyperlipidemia in an other family member; Hypertension in an other family member; Kidney failure in her  father; Stroke in an other family member.   SOCIAL HISTORY:  reports that she quit smoking about 20 years ago. Her smoking use included Cigarettes. She has a 50 pack-year smoking history. She has never used smokeless tobacco. She reports that she drinks alcohol. She reports that she does not use illicit drugs.  REVIEW OF SYSTEMS:   Constitutional: Negative for fever, chills, weight loss, malaise/fatigue and diaphoresis.  HENT: Negative for hearing loss, ear pain, nosebleeds, congestion, sore throat, neck pain, tinnitus and ear discharge.   Eyes: Negative for blurred vision, double vision, photophobia, pain, discharge and redness.  Respiratory: per HPI Cardiovascular: Negative for chest pain, palpitations, orthopnea, claudication, leg swelling and PND.  Gastrointestinal: Negative for heartburn, nausea, vomiting, abdominal pain, diarrhea, constipation, blood in stool and melena.  Genitourinary: Negative for dysuria, urgency,  frequency, hematuria and flank pain.  Musculoskeletal:per HPI Skin: Negative for itching and rash.  Neurological: Negative for dizziness, tingling, tremors, sensory change, speech change, focal weakness, seizures, loss of consciousness, weakness and headaches.  Endo/Heme/Allergies: Negative for environmental allergies and polydipsia. Does not bruise/bleed easily.  SUBJECTIVE:    VITAL SIGNS: Temp:  [97.8 F (36.6 C)-99.5 F (37.5 C)] 99.5 F (37.5 C) (03/04 0601) Pulse Rate:  [80-89] 80 (03/04 0601) Resp:  [14-16] 16 (03/04 0601) BP: (118-155)/(51-58) 118/51 mmHg (03/04 1009) SpO2:  [86 %-97 %] 96 % (03/04 0858)  PHYSICAL EXAMINATION: Gen: elderly, chronically ill appearing HEENT: NCAT, EOMi,  PULM:  Few crackles R base, otherwise clear CV: RRR, systolic mumur, no JVD AB: BS+, soft, nontender Ext: warm, trace edema, no clubbing, no cyanosis Derm: no rash or skin breakdown Neuro: A&Ox4, maew    Recent Labs Lab 05/26/14 0900 05/27/14 0525 05/28/14 0602    NA 139 136 136  K 3.0* 3.5 4.3  CL 103 101 103  CO2 32 27 25  BUN 12 11 9   CREATININE 1.25* 1.07 1.17*  GLUCOSE 105* 91 107*    Recent Labs Lab 05/26/14 0625 05/27/14 0525 05/28/14 0602  HGB 9.1* 8.6* 8.2*  HCT 30.0* 28.2* 26.9*  WBC 7.9 8.4 9.1  PLT 302 304 296   Pelvis Portable  05/26/2014   CLINICAL DATA:  Post RIGHT hip hemiarthroplasty  EXAM: PORTABLE PELVIS 1-2 VIEWS  COMPARISON:  Portable exam 1912 hours compared to preoperative images of 05/25/2014  FINDINGS: Interval resection of RIGHT femoral head/neck and placement of a femoral prosthesis.  Bones demineralized.  No acute fracture dislocation.  IMPRESSION: RIGHT hip prosthesis without acute complication.   Electronically Signed   By: Lavonia Dana M.D.   On: 05/26/2014 19:31    ASSESSMENT / PLAN:  1) Chest mass> I agree with my partner Dr. Halford Chessman that this right lower lobe lesion is likely malignant and I believe that it is the cause of her hemoptysis.  However, I also agree that pursuing a tissue diagnosis is not neccessary as she has stated that she does not want to treat it.  At this point I recommend she f/u with Dr. Halford Chessman in 3-4 weeks  2) Hemoptysis> related to the mass and cough.  Rec: cough suppression with tessalon, when hip pain improved and she is off of narcotics could use tussionex if needed for the cough  3) Hip fracture> she will be high risk for DVT after this surgery and with a potential malignancy.  The low dose lovenox has not exacerbated her hemoptysis, so I recommend that she continue this in rehab   Roselie Awkward, MD [facility] Pager: [phone] Cell: [phone] If no response, call [phone]   05/28/2014, 1:40 PM

## 2014-05-28 NOTE — Discharge Planning (Addendum)
Patient will discharge today per MD order. Patient will discharge back to: [facility] RN to call report prior to transportation to [phone] Transportation: Corey Harold (MD reports discharge pending pulmonary consult, made per family request) Packet on chart complete pending completion of dc summary.  CSW sent discharge summary to SNF for review.  Packet is complete.  RN, patient and family aware of discharge plans.  Nonnie Done, [place] [phone]  Psychiatric & Orthopedics (5N 1-16) Clinical Social Worker

## 2014-06-02 ENCOUNTER — Encounter: Payer: Self-pay | Admitting: Internal Medicine

## 2014-06-02 ENCOUNTER — Non-Acute Institutional Stay (SKILLED_NURSING_FACILITY): Payer: Medicare Other | Admitting: Internal Medicine

## 2014-06-02 DIAGNOSIS — Z9660 Presence of unspecified orthopedic joint implant: Secondary | ICD-10-CM | POA: Diagnosis not present

## 2014-06-02 DIAGNOSIS — Z96641 Presence of right artificial hip joint: Secondary | ICD-10-CM

## 2014-06-02 DIAGNOSIS — J9611 Chronic respiratory failure with hypoxia: Secondary | ICD-10-CM | POA: Diagnosis not present

## 2014-06-02 DIAGNOSIS — E41 Nutritional marasmus: Secondary | ICD-10-CM

## 2014-06-02 DIAGNOSIS — E43 Unspecified severe protein-calorie malnutrition: Secondary | ICD-10-CM

## 2014-06-02 DIAGNOSIS — R918 Other nonspecific abnormal finding of lung field: Secondary | ICD-10-CM

## 2014-06-02 DIAGNOSIS — J440 Chronic obstructive pulmonary disease with acute lower respiratory infection: Secondary | ICD-10-CM

## 2014-06-02 DIAGNOSIS — I350 Nonrheumatic aortic (valve) stenosis: Secondary | ICD-10-CM

## 2014-06-02 DIAGNOSIS — S72001D Fracture of unspecified part of neck of right femur, subsequent encounter for closed fracture with routine healing: Secondary | ICD-10-CM | POA: Diagnosis not present

## 2014-06-02 NOTE — Progress Notes (Signed)
Patient ID: [name], female   DOB: 1920-01-26, 81 y.o.   MRN: [id number]  Provider:  Rexene Edison. Mariea Clonts, D.O., C.M.D. Location:  [facility]  PCP: Kandice Hams, MD  Code Status: DNR, hospice care Advanced Directive information Does patient have an advance directive?: Yes, Type of Advance Directive: Living will;Out of facility DNR (pink MOST or yellow form), Pre-existing out of facility DNR order (yellow form or pink MOST form): Yellow form placed in chart (order not valid for inpatient use), Does patient want to make changes to advanced directive?: No - Patient declined  Allergies  Allergen Reactions  . Cortisone Other (See Comments)    Rash   . Minocin [Minocycline Hcl] Other (See Comments)    Unknown  . Minocycline Hcl     unknown  . Neomycin-Bacitracin Zn-Polymyx     unknown  . Neosporin [Neomycin-Bacitracin Zn-Polymyx] Other (See Comments)    Unknown  . Other Other (See Comments)    Steroidal Neuromuscular Blockers  . Tetanus Toxoids Other (See Comments)    Unknown    Chief Complaint  Patient presents with  . Readmit To SNF    HPI: 81 y.o. female with h/o severe aortic stenosis, paroxysmal afib (off coumadin since 8/15 due to hemoptysis and then on asa, but on hold due to the bleeding as well), COPD, severe protein calorie malnutrition, and hypothyroidism was initially admitted to rehab 05/25/14 after decline in status.  She had been living in independent living.  Review of her records shows that she had developed pneumonia 01/25/14 which was felt to be postobstructive due to a RLL mass.  Upon review with pulmonary (Dr. Halford Chessman), it was decided not to biopsy the lesion due to no plans for surgery, chemo or xrt for the mass.  The pneumonia was treated initially with vanc and zosyn and then she was placed on levaquin and returned to [place] on hospice care via [facility].    She was continuing to follow with Dr. Delfina Redwood and was seen by him just after her initial snf admission  3/1 (so we could not see her that same day)--she c/o right hip pain and an xray was done that showed a subcapital right hip fx.  She was admitted to the hospital and underwent right hip hemiarthroplasty by Dr. Alvan Dame on 05/26/14.  She denied any recent fall/trauma.    She has f/u appts scheduled outpatient with pulmonary 3/23, Dr. Stanford Breed 4/18, and Dr. Halford Chessman 4/28.  The d/c summary recommends f/u cbc, bmp in 3 wks.  ROS: Review of Systems  Constitutional: Positive for malaise/fatigue.  HENT: Positive for hearing loss.   Eyes: Negative for pain.  Respiratory: Positive for hemoptysis and shortness of breath. Negative for cough.   Cardiovascular: Positive for chest pain. Negative for palpitations.  Gastrointestinal: Positive for constipation.  Genitourinary: Negative for dysuria.  Musculoskeletal: Positive for myalgias and joint pain. Negative for falls.  Skin: Negative for rash.  Neurological: Negative for dizziness.  Psychiatric/Behavioral: Positive for depression.     Past Medical History  Diagnosis Date  . Cholelithiases   . Hypothyroid   . Leg paresthesia   . Aortic valve stenosis   . Arthritis   . Hyperlipidemia   . Dementia   . Carotid artery occlusion   . Renal disorder   . Hypertension   . COPD (chronic obstructive pulmonary disease)   . SEIZURE DISORDER 11/05/2006  . GERD 11/05/2006  . CVA 02/07/2007  . Macular degeneration of both eyes   . Blind  in both eyes   . Peripheral neuropathy   . Pneumonia     "many times"  . Internal hemorrhoid, bleeding   . Malaria     "as a child"  . Skin cancer     "nose, face mostly"  . Chronic lower back pain     "when I go to the bathroom"  . Intestinal infection due to Clostridium difficile 09/2011  . Benign neoplasm of breast   . Gout, unspecified   . Anemia   . Anxiety   . Other dysfunctions of sleep stages or arousal from sleep   . Depression   . Chronic rhinitis   . Abscess of lung(513.0)     RUL  . Diaphragmatic hernia  without mention of obstruction or gangrene   . Diverticulosis of colon (without mention of hemorrhage)   . Acute duodenal ulcer with perforation and obstruction 08/2011    perforation  . Hemorrhage of rectum and anus   . Abscess of intestine 09/2011    drainage  . Other psoriasis   . Lumbago   . Nonspecific elevation of levels of transaminase or lactic acid dehydrogenase (LDH)   . Solitary pulmonary nodule     RLL nodule 1 cm  . Transient ischemic attack (TIA), and cerebral infarction without residual deficits(V12.54)   . Long term (current) use of anticoagulants   . Sacroiliitis 08/27/2012  . Anemia, iron deficiency 10/16/2011  . Congestive heart failure   . Spondylosis of lumbosacral region 09/24/2012    L/S xray 08/2012: Mild to moderate osteoarthritis and degenerative spondylosis he diffusely. Anterior subluxation of L4 relative to L5 at 5.7 cm. Mild to moderate levoconvex curvature the mid lumbar spine    . Dysrhythmia     hx atrial fib  . Arteritis     hx giant cell arteritis  . Aortic stenosis, severe 03/02/2013    Followed since 2004 with mild aortic valve stenosis and mild regurgitation. Echocardiograms 2006-2013 show  progression from mild to moderate aortic stenosis, mild insufficiency. Mild to moderate mitral valve regurgitation evident in 2013.   07/25/11: Worsening valve disease by echocardiogram, pt is not interested in invasive treatment. Symptoms appear stable for now. Continue to monitor      Past Surgical History  Procedure Laterality Date  . Vesicovaginal fistula closure w/ tah    . Tonsillectomy    . Breast lumpectomy      bilaterally  . Cataract extraction w/ intraocular lens  implant, bilateral    . Skin cancer excision      "nose and face"  . Abdominal hysterectomy    . Hemorrhoid surgery  11/2008    banding  . Hip surgery Right 05/26/2014  . Hip arthroplasty Right 05/26/2014    Procedure: ARTHROPLASTY BIPOLAR HIP;  Surgeon: Mauri Pole, MD;  Location: [facility];   Service: Orthopedics;  Laterality: Right;   Social History:   reports that she quit smoking about 20 years ago. Her smoking use included Cigarettes. She has a 50 pack-year smoking history. She has never used smokeless tobacco. She reports that she drinks alcohol. She reports that she does not use illicit drugs.  Family History  Problem Relation Age of Onset  . Hyperlipidemia    . Hypertension    . Stroke    . Colon cancer Daughter   . Heart attack Father   . Kidney failure Father   . Heart disease Son     Medications: Patient's Medications  New Prescriptions   No medications  on file  Previous Medications   ACETAMINOPHEN (TYLENOL) 325 MG TABLET    Take 650 mg by mouth every 6 (six) hours as needed for mild pain.    ACYCLOVIR OINTMENT (ZOVIRAX) 5 %    Apply 1 application topically every 3 (three) hours as needed. For cold sores   ALBUTEROL (PROVENTIL HFA;VENTOLIN HFA) 108 (90 BASE) MCG/ACT INHALER    Inhale 2 puffs into the lungs every 6 (six) hours as needed for wheezing or shortness of breath.   ALBUTEROL (PROVENTIL) (2.5 MG/3ML) 0.083% NEBULIZER SOLUTION    Take 3 mLs (2.5 mg total) by nebulization every 2 (two) hours as needed for wheezing.   ALUM & MAG HYDROXIDE-SIMETH (MAGIC MOUTHWASH W/LIDOCAINE) SOLN    Take 5 mLs by mouth 4 (four) times daily as needed for mouth pain.   ALUM & MAG HYDROXIDE-SIMETH (MAGIC MOUTHWASH) SOLN    Take 10 mLs by mouth 3 (three) times daily.   AMLODIPINE (NORVASC) 10 MG TABLET    Take 0.5 tablets (5 mg total) by mouth daily.   AMOXICILLIN (AMOXIL) 500 MG CAPSULE    Take 2,000 mg by mouth once. Prior to dental procedure   ASPIRIN EC 325 MG TABLET    Take 1 tablet (325 mg total) by mouth 2 (two) times daily. Take for 4 weeks.   AUGMENTED BETAMETHASONE DIPROPIONATE (DIPROLENE-AF) 0.05 % OINTMENT    Apply 1 application topically 2 (two) times daily.   BENZONATATE (TESSALON) 200 MG CAPSULE    Take 1 capsule (200 mg total) by mouth 3 (three) times daily as  needed for cough.   BETAMETHASONE DIPROPIONATE (DIPROLENE) 0.05 % CREAM    Apply 1 application topically 2 (two) times daily.   CETIRIZINE (ZYRTEC) 10 MG TABLET    Take 10 mg by mouth daily.   CHLORHEXIDINE (PERIDEX) 0.12 % SOLUTION    Use as directed 15 mLs in the mouth or throat. swish 15 mls for 30 sec then spit, twice daily   CHLORPHENIRAMINE-HYDROCODONE (TUSSIONEX) 10-8 MG/5ML LQCR    Take 5 mLs by mouth every 12 (twelve) hours as needed for cough.   CLOCORTOLONE PIVALATE (CLODERM) 0.1 % CREAM    Apply 1 application topically 2 (two) times daily. Applies to lesion on left cheek.   COAL TAR (NEUTROGENA T-GEL) 0.5 % SHAMPOO    Apply 1 application topically See admin instructions. She uses on Tuesday and Friday.   DIPHENHYDRAMINE-ZINC ACETATE (BENADRYL) CREAM    Apply topically 3 (three) times daily as needed for itching.   DONEPEZIL (ARICEPT) 10 MG TABLET    Take 10 mg by mouth at bedtime.    EMOLLIENT (CERAVE) CREA    Apply 1 application topically daily. Applies to dry skin.   ESCITALOPRAM (LEXAPRO) 20 MG TABLET    Take 20 mg by mouth every morning.    FEEDING SUPPLEMENT, RESOURCE BREEZE, (RESOURCE BREEZE) LIQD    Take 1 Container by mouth 3 (three) times daily between meals.   FERROUS SULFATE 325 (65 FE) MG TABLET    Take 1 tablet (325 mg total) by mouth 3 (three) times daily after meals.   FLUTICASONE (FLONASE) 50 MCG/ACT NASAL SPRAY    Place 2 sprays into both nostrils daily.   GABAPENTIN (NEURONTIN) 100 MG CAPSULE    Take 200 mg by mouth at bedtime.    HYDROCODONE-ACETAMINOPHEN (NORCO) 5-325 MG PER TABLET    Take 1-2 tablets by mouth every 4 (four) hours as needed for moderate pain.   HYDROXYZINE (ATARAX/VISTARIL) 25 MG TABLET  Take 25 mg by mouth at bedtime.   LEVOTHYROXINE (SYNTHROID, LEVOTHROID) 88 MCG TABLET    Take 88 mcg by mouth daily before breakfast.    LIDOCAINE (LIDODERM) 5 %    Place 1 patch onto the skin daily. Remove & Discard patch within 12 hours or as directed by MD    LORAZEPAM (ATIVAN) 2 MG/ML CONCENTRATED SOLUTION    Take 0.5 mLs (1 mg total) by mouth every 6 (six) hours as needed for anxiety or sedation.   MORPHINE SULFATE (MORPHINE CONCENTRATE) 10 MG / 0.5 ML CONCENTRATED SOLUTION    Take 0.25 mLs (5 mg total) by mouth every 4 (four) hours as needed for severe pain, anxiety or shortness of breath.   MULTIPLE VITAMINS-MINERALS (PRESERVISION AREDS) CAPS    Take 1 capsule by mouth 2 (two) times daily with a meal.    OLOPATADINE (PATANOL) 0.1 % OPHTHALMIC SOLUTION    Place 1 drop into both eyes 2 (two) times daily as needed. For dry eyes   POLYETHYLENE GLYCOL (MIRALAX / GLYCOLAX) PACKET    Take 17 g by mouth every morning.    PRAMOXINE-MINERAL OIL-ZINC (TUCKS) 1-12.5 % RECTAL OINTMENT    Place 1 application rectally every 2 (two) hours as needed for itching (itching).   PRAVASTATIN (PRAVACHOL) 40 MG TABLET    Take 1 tablet (40 mg total) by mouth every evening.   PROPYLENE GLYCOL (SYSTANE BALANCE) 0.6 % SOLN    Place 1 drop into both eyes daily as needed (For dry or itchy eyes.). One drop both eye as needed   SENNA-DOCUSATE (SENOKOT S) 8.6-50 MG PER TABLET    Take 1-2 tablets by mouth See admin instructions. She takes one tablet in the morning and two tablets at bedtime.   TIOTROPIUM (SPIRIVA) 18 MCG INHALATION CAPSULE    Place 18 mcg into inhaler and inhale daily.  Modified Medications   No medications on file  Discontinued Medications   No medications on file     Physical Exam: Filed Vitals:   06/02/14 1045  BP: 129/63  Pulse: 66  Temp: 97.9 F (36.6 C)  Resp: 18  Height: 5' 2.1" (1.577 m)  Weight: 152 lb 9.6 oz (69.219 kg)  SpO2: 95%   Physical Exam  Constitutional: She is oriented to person, place, and time. No distress.  Cardiovascular: Normal rate, regular rhythm and intact distal pulses.   Murmur heard. Pulmonary/Chest: She has wheezes.  Increased effort; diminished breath sounds  Musculoskeletal: She exhibits edema and tenderness.  Of  bilateral legs  Neurological: She is alert and oriented to person, place, and time.  Skin:  Multiple excoriations of face; erythema of back; right groin hard, raised mass about size of quarter; another subcutaneous lesion is in right lower quadrant, nontender and a bit larger than a half dollar  Psychiatric:  Flat affect, expects immediate answers to her questions and then says they were not answered; did not want to switch physicians      Labs reviewed: Basic Metabolic Panel:  Recent Labs  11/04/13 2030  05/26/14 0900 05/27/14 0525 05/28/14 0602  NA  --   < > 139 136 136  K  --   < > 3.0* 3.5 4.3  CL  --   < > 103 101 103  CO2  --   < > 32 27 25  GLUCOSE  --   < > 105* 91 107*  BUN  --   < > 12 11 9   CREATININE  --   < >  1.25* 1.07 1.17*  CALCIUM  --   < > 8.6 8.3* 8.3*  MG 1.8  --  2.1  --   --   < > = values in this interval not displayed. Liver Function Tests:  Recent Labs  11/04/13 1548  AST 25  ALT 6  ALKPHOS 89  BILITOT 0.3  PROT 7.5  ALBUMIN 3.2*   No results for input(s): LIPASE, AMYLASE in the last 8760 hours. No results for input(s): AMMONIA in the last 8760 hours. CBC:  Recent Labs  11/04/13 1548  01/25/14 1502  05/26/14 0625 05/27/14 0525 05/28/14 0602  WBC 6.7  < > 7.7  < > 7.9 8.4 9.1  NEUTROABS 3.6  --  5.2  --   --   --   --   HGB 8.6*  < > 11.6*  < > 9.1* 8.6* 8.2*  HCT 29.6*  < > 37.0  < > 30.0* 28.2* 26.9*  MCV 74.2*  < > 89.4  < > 81.3 80.6 80.8  PLT 343  < > 258  < > 302 304 296  < > = values in this interval not displayed. Cardiac Enzymes: No results for input(s): CKTOTAL, CKMB, CKMBINDEX, TROPONINI in the last 8760 hours. BNP: Invalid input(s): POCBNP CBG: No results for input(s): GLUCAP in the last 8760 hours.  Imaging and Procedures: 05/25/14 Right hip and pelvis:  1. There is an acute impacted slightly angulated subcapital fracture of the right hip. 2. There is no acute fracture of the bony pelvis. 3. These results will be  called to the ordering clinician or representative by the Radiologist Assistant, and communication documented in the PACS or zVision Dashboard.  05/25/14:  CXR portable 1. RIGHT infrahilar and perihilar airspace disease may represent asymmetric pulmonary edema, aspiration or pneumonia. 2. Moderate hiatal hernia with fluid level in the LEFT chest.  05/26/14:  Portable pelvis xray:   RIGHT hip prosthesis without acute complication.  Assessment/Plan 1. Chronic respiratory failure with hypoxia -due to lung ca, copd, aortic stenosis -cont oxygen as needed for comfort -she is now on hospice care -she seems very unhappy and says she can't possibly be comfortable considering all that's wrong with her though I explained that was our objective as well as that of hospice  2. Hip fracture, right, closed, with routine healing, subsequent encounter -s/p replacement -suspect this was pathologic, but no biopsy of bone available to confirm this and no mention of malignancy on xray  3. Status post right hip replacement -keep f/u with orthopedics  4. Aortic stenosis, severe -not helping her respiratory state, cont to monitor  5. Chronic obstructive pulmonary disease with acute lower respiratory infection -has had postobstructive pneumonia, completed abx, but has ot gained back strength due to the presumed malignancy  6. Severe malnutrition -poor po intake, lacks appetite, cont supplements, social support  7. Lung mass -suspected malignancy based on her decline -has not been biopsy confirmed due to her frailty/poor prognosis -on hospice care--goal is comfort   Has numerous different itching areas and creams were revised and orders written for those that are actually active.    Functional status: dependent in adls except feeding at present due to weakness and can ambulate with partial weightbearing right leg and walker and assist  Family/ staff Communication: discussed with her nurse

## 2014-06-06 DIAGNOSIS — Z96641 Presence of right artificial hip joint: Secondary | ICD-10-CM | POA: Insufficient documentation

## 2014-06-06 DIAGNOSIS — S72001A Fracture of unspecified part of neck of right femur, initial encounter for closed fracture: Secondary | ICD-10-CM | POA: Insufficient documentation

## 2014-06-07 ENCOUNTER — Encounter (HOSPITAL_COMMUNITY): Payer: Self-pay | Admitting: Emergency Medicine

## 2014-06-07 ENCOUNTER — Inpatient Hospital Stay (HOSPITAL_COMMUNITY)
Admission: EM | Admit: 2014-06-07 | Discharge: 2014-06-08 | DRG: 180 | Disposition: A | Discharge status: Skilled Nursing Facility | Attending: Internal Medicine | Admitting: Internal Medicine

## 2014-06-07 ENCOUNTER — Emergency Department (HOSPITAL_COMMUNITY)

## 2014-06-07 ENCOUNTER — Telehealth: Payer: Self-pay | Admitting: Pulmonary Disease

## 2014-06-07 DIAGNOSIS — I350 Nonrheumatic aortic (valve) stenosis: Secondary | ICD-10-CM | POA: Diagnosis present

## 2014-06-07 DIAGNOSIS — M1990 Unspecified osteoarthritis, unspecified site: Secondary | ICD-10-CM | POA: Diagnosis present

## 2014-06-07 DIAGNOSIS — I2699 Other pulmonary embolism without acute cor pulmonale: Secondary | ICD-10-CM | POA: Diagnosis present

## 2014-06-07 DIAGNOSIS — Z515 Encounter for palliative care: Secondary | ICD-10-CM

## 2014-06-07 DIAGNOSIS — Z96641 Presence of right artificial hip joint: Secondary | ICD-10-CM | POA: Diagnosis present

## 2014-06-07 DIAGNOSIS — Z7982 Long term (current) use of aspirin: Secondary | ICD-10-CM

## 2014-06-07 DIAGNOSIS — Z961 Presence of intraocular lens: Secondary | ICD-10-CM | POA: Diagnosis present

## 2014-06-07 DIAGNOSIS — Z66 Do not resuscitate: Secondary | ICD-10-CM | POA: Diagnosis present

## 2014-06-07 DIAGNOSIS — I6529 Occlusion and stenosis of unspecified carotid artery: Secondary | ICD-10-CM | POA: Diagnosis present

## 2014-06-07 DIAGNOSIS — K920 Hematemesis: Secondary | ICD-10-CM | POA: Diagnosis present

## 2014-06-07 DIAGNOSIS — F0390 Unspecified dementia without behavioral disturbance: Secondary | ICD-10-CM | POA: Diagnosis present

## 2014-06-07 DIAGNOSIS — E785 Hyperlipidemia, unspecified: Secondary | ICD-10-CM | POA: Diagnosis present

## 2014-06-07 DIAGNOSIS — H540 Blindness, both eyes: Secondary | ICD-10-CM | POA: Diagnosis present

## 2014-06-07 DIAGNOSIS — C3490 Malignant neoplasm of unspecified part of unspecified bronchus or lung: Secondary | ICD-10-CM | POA: Diagnosis not present

## 2014-06-07 DIAGNOSIS — R042 Hemoptysis: Secondary | ICD-10-CM | POA: Diagnosis not present

## 2014-06-07 DIAGNOSIS — F419 Anxiety disorder, unspecified: Secondary | ICD-10-CM | POA: Diagnosis present

## 2014-06-07 DIAGNOSIS — E039 Hypothyroidism, unspecified: Secondary | ICD-10-CM | POA: Diagnosis present

## 2014-06-07 DIAGNOSIS — Z9842 Cataract extraction status, left eye: Secondary | ICD-10-CM

## 2014-06-07 DIAGNOSIS — Z85828 Personal history of other malignant neoplasm of skin: Secondary | ICD-10-CM

## 2014-06-07 DIAGNOSIS — Z87891 Personal history of nicotine dependence: Secondary | ICD-10-CM

## 2014-06-07 DIAGNOSIS — I129 Hypertensive chronic kidney disease with stage 1 through stage 4 chronic kidney disease, or unspecified chronic kidney disease: Secondary | ICD-10-CM | POA: Diagnosis present

## 2014-06-07 DIAGNOSIS — K219 Gastro-esophageal reflux disease without esophagitis: Secondary | ICD-10-CM | POA: Diagnosis present

## 2014-06-07 DIAGNOSIS — I509 Heart failure, unspecified: Secondary | ICD-10-CM | POA: Diagnosis present

## 2014-06-07 DIAGNOSIS — G40909 Epilepsy, unspecified, not intractable, without status epilepticus: Secondary | ICD-10-CM | POA: Diagnosis present

## 2014-06-07 DIAGNOSIS — N183 Chronic kidney disease, stage 3 (moderate): Secondary | ICD-10-CM | POA: Diagnosis present

## 2014-06-07 DIAGNOSIS — J449 Chronic obstructive pulmonary disease, unspecified: Secondary | ICD-10-CM | POA: Diagnosis present

## 2014-06-07 DIAGNOSIS — Z9841 Cataract extraction status, right eye: Secondary | ICD-10-CM

## 2014-06-07 DIAGNOSIS — I4891 Unspecified atrial fibrillation: Secondary | ICD-10-CM | POA: Diagnosis present

## 2014-06-07 DIAGNOSIS — F329 Major depressive disorder, single episode, unspecified: Secondary | ICD-10-CM | POA: Diagnosis present

## 2014-06-07 DIAGNOSIS — R918 Other nonspecific abnormal finding of lung field: Secondary | ICD-10-CM | POA: Diagnosis present

## 2014-06-07 DIAGNOSIS — Z8673 Personal history of transient ischemic attack (TIA), and cerebral infarction without residual deficits: Secondary | ICD-10-CM

## 2014-06-07 DIAGNOSIS — G629 Polyneuropathy, unspecified: Secondary | ICD-10-CM | POA: Diagnosis present

## 2014-06-07 DIAGNOSIS — Z79899 Other long term (current) drug therapy: Secondary | ICD-10-CM

## 2014-06-07 DIAGNOSIS — D649 Anemia, unspecified: Secondary | ICD-10-CM | POA: Diagnosis present

## 2014-06-07 DIAGNOSIS — M109 Gout, unspecified: Secondary | ICD-10-CM | POA: Diagnosis present

## 2014-06-07 LAB — COMPREHENSIVE METABOLIC PANEL
ALK PHOS: 83 U/L (ref 39–117)
ALT: 8 U/L (ref 0–35)
AST: 17 U/L (ref 0–37)
Albumin: 2.5 g/dL — ABNORMAL LOW (ref 3.5–5.2)
Anion gap: 8 (ref 5–15)
BUN: 10 mg/dL (ref 6–23)
CO2: 24 mmol/L (ref 19–32)
Calcium: 8.8 mg/dL (ref 8.4–10.5)
Chloride: 106 mmol/L (ref 96–112)
Creatinine, Ser: 1.14 mg/dL — ABNORMAL HIGH (ref 0.50–1.10)
GFR calc Af Amer: 47 mL/min — ABNORMAL LOW (ref >90)
GFR calc non Af Amer: 40 mL/min — ABNORMAL LOW (ref >90)
GLUCOSE: 93 mg/dL (ref 70–99)
POTASSIUM: 4 mmol/L (ref 3.5–5.1)
Sodium: 138 mmol/L (ref 135–145)
Total Bilirubin: 0.5 mg/dL (ref 0.3–1.2)
Total Protein: 6.5 g/dL (ref 6.0–8.3)

## 2014-06-07 LAB — CBC WITH DIFFERENTIAL/PLATELET
BASOS ABS: 0 10*3/uL (ref 0.0–0.1)
Basophils Relative: 0 % (ref 0–1)
EOS ABS: 1.5 10*3/uL — AB (ref 0.0–0.7)
Eosinophils Relative: 12 % — ABNORMAL HIGH (ref 0–5)
HCT: 32 % — ABNORMAL LOW (ref 36.0–46.0)
Hemoglobin: 9.6 g/dL — ABNORMAL LOW (ref 12.0–15.0)
LYMPHS ABS: 1.8 10*3/uL (ref 0.7–4.0)
Lymphocytes Relative: 14 % (ref 12–46)
MCH: 26.2 pg (ref 26.0–34.0)
MCHC: 30 g/dL (ref 30.0–36.0)
MCV: 87.2 fL (ref 78.0–100.0)
MONOS PCT: 8 % (ref 3–12)
Monocytes Absolute: 1 10*3/uL (ref 0.1–1.0)
Neutro Abs: 8.6 10*3/uL — ABNORMAL HIGH (ref 1.7–7.7)
Neutrophils Relative %: 66 % (ref 43–77)
Platelets: 442 10*3/uL — ABNORMAL HIGH (ref 150–400)
RBC: 3.67 MIL/uL — ABNORMAL LOW (ref 3.87–5.11)
RDW: 23.8 % — AB (ref 11.5–15.5)
WBC: 12.9 10*3/uL — AB (ref 4.0–10.5)

## 2014-06-07 MED ORDER — SODIUM CHLORIDE 0.9 % IV BOLUS (SEPSIS)
500.0000 mL | Freq: Once | INTRAVENOUS | Status: AC
  Administered 2014-06-08: 500 mL via INTRAVENOUS

## 2014-06-07 NOTE — ED Notes (Signed)
IV attempted x's 2 without success 

## 2014-06-07 NOTE — Telephone Encounter (Signed)
Called by Junie Panning from [facility] concerning [name] who has experienced increased hemoptysis from pink to bright red over the course of the last 24 hours. Difficult situation patient is on hospice. Prognosis is likely poor. Patient insists the Dr. Halford Chessman be notified. Patient is on 2.0 Lmin [place] O2 with sat = 92% and RR = 15. Advised patient to go to Emergency Department at  [facility] to be evaluated.

## 2014-06-07 NOTE — ED Notes (Signed)
Pt to ED from [facility] with c/o coughing up blood.  Pt st's she has been doing this for a long time but now it has become constant.  Bright red in color.  Pt st's she has hx of lung ca.  Pt was recently discharged from [facility] after right hip replacement.

## 2014-06-07 NOTE — ED Provider Notes (Signed)
CSN: [id number]     Arrival date & time 06/07/14  2100 History   First MD Initiated Contact with Patient 06/07/14 2123     Chief Complaint  Patient presents with  . Hematemesis     (Consider location/radiation/quality/duration/timing/severity/associated sxs/prior Treatment) Patient is a 81 y.o. female presenting with cough. The history is provided by the patient. No language interpreter was used.  Cough Cough characteristics:  Productive Sputum characteristics:  Bloody Severity:  Moderate Onset quality:  Gradual Associated symptoms: no chest pain, no chills and no fever   Associated symptoms comment:  The patient arrives from [name] with complaint of hematemesis. She denies pain. She has a known history of lung cancer, currently DNR without ongoing chemotherapy or radiation treatment. No fever, nausea or vomiting. She reports she has had a bloody cough "for a long time" but states it has been persistent today which is unusual. She denies any worse SOB over her usual. She is on home O2 at 2 L chronically.   Past Medical History  Diagnosis Date  . Cholelithiases   . Hypothyroid   . Leg paresthesia   . Aortic valve stenosis   . Arthritis   . Hyperlipidemia   . Dementia   . Carotid artery occlusion   . Renal disorder   . Hypertension   . COPD (chronic obstructive pulmonary disease)   . SEIZURE DISORDER 11/05/2006  . GERD 11/05/2006  . CVA 02/07/2007  . Macular degeneration of both eyes   . Blind in both eyes   . Peripheral neuropathy   . Pneumonia     "many times"  . Internal hemorrhoid, bleeding   . Malaria     "as a child"  . Skin cancer     "nose, face mostly"  . Chronic lower back pain     "when I go to the bathroom"  . Intestinal infection due to Clostridium difficile 09/2011  . Benign neoplasm of breast   . Gout, unspecified   . Anemia   . Anxiety   . Other dysfunctions of sleep stages or arousal from sleep   . Depression   . Chronic rhinitis   . Abscess of  lung(513.0)     RUL  . Diaphragmatic hernia without mention of obstruction or gangrene   . Diverticulosis of colon (without mention of hemorrhage)   . Acute duodenal ulcer with perforation and obstruction 08/2011    perforation  . Hemorrhage of rectum and anus   . Abscess of intestine 09/2011    drainage  . Other psoriasis   . Lumbago   . Nonspecific elevation of levels of transaminase or lactic acid dehydrogenase (LDH)   . Solitary pulmonary nodule     RLL nodule 1 cm  . Transient ischemic attack (TIA), and cerebral infarction without residual deficits(V12.54)   . Long term (current) use of anticoagulants   . Sacroiliitis 08/27/2012  . Anemia, iron deficiency 10/16/2011  . Congestive heart failure   . Spondylosis of lumbosacral region 09/24/2012    L/S xray 08/2012: Mild to moderate osteoarthritis and degenerative spondylosis he diffusely. Anterior subluxation of L4 relative to L5 at 5.7 cm. Mild to moderate levoconvex curvature the mid lumbar spine    . Dysrhythmia     hx atrial fib  . Arteritis     hx giant cell arteritis  . Aortic stenosis, severe 03/02/2013    Followed since 2004 with mild aortic valve stenosis and mild regurgitation. Echocardiograms 2006-2013 show  progression from mild to moderate aortic  stenosis, mild insufficiency. Mild to moderate mitral valve regurgitation evident in 2013.   07/25/11: Worsening valve disease by echocardiogram, pt is not interested in invasive treatment. Symptoms appear stable for now. Continue to monitor      Past Surgical History  Procedure Laterality Date  . Vesicovaginal fistula closure w/ tah    . Tonsillectomy    . Breast lumpectomy      bilaterally  . Cataract extraction w/ intraocular lens  implant, bilateral    . Skin cancer excision      "nose and face"  . Abdominal hysterectomy    . Hemorrhoid surgery  11/2008    banding  . Hip surgery Right 05/26/2014  . Hip arthroplasty Right 05/26/2014    Procedure: ARTHROPLASTY BIPOLAR HIP;   Surgeon: Mauri Pole, MD;  Location: [facility];  Service: Orthopedics;  Laterality: Right;   Family History  Problem Relation Age of Onset  . Hyperlipidemia    . Hypertension    . Stroke    . Colon cancer Daughter   . Heart attack Father   . Kidney failure Father   . Heart disease Son    History  Substance Use Topics  . Smoking status: Former Smoker -- 1.00 packs/day for 50 years    Types: Cigarettes    Quit date: 08/08/1993  . Smokeless tobacco: Never Used  . Alcohol Use: Yes     Comment: 10/15/11 "have a drink when I go out to dinner; don't go out often"   OB History    No data available     Review of Systems  Constitutional: Negative for fever and chills.  HENT: Negative.   Respiratory: Positive for cough.        See HPI.  Cardiovascular: Negative.  Negative for chest pain.  Gastrointestinal: Negative.   Musculoskeletal: Negative.   Skin: Negative.   Neurological: Negative.       Allergies  Cortisone; Minocin; Minocycline hcl; Neomycin-bacitracin zn-polymyx; Neosporin; Other; and Tetanus toxoids  Home Medications   Prior to Admission medications   Medication Sig Start Date End Date Taking? Authorizing Provider  acetaminophen (TYLENOL) 325 MG tablet Take 650 mg by mouth every 6 (six) hours as needed for mild pain.     Historical Provider, MD  acyclovir ointment (ZOVIRAX) 5 % Apply 1 application topically every 3 (three) hours as needed. For cold sores    Historical Provider, MD  albuterol (PROVENTIL HFA;VENTOLIN HFA) 108 (90 BASE) MCG/ACT inhaler Inhale 2 puffs into the lungs every 6 (six) hours as needed for wheezing or shortness of breath.    Historical Provider, MD  albuterol (PROVENTIL) (2.5 MG/3ML) 0.083% nebulizer solution Take 3 mLs (2.5 mg total) by nebulization every 2 (two) hours as needed for wheezing. 01/29/14   Shanker Kristeen Mans, MD  Alum & Mag Hydroxide-Simeth (MAGIC MOUTHWASH W/LIDOCAINE) SOLN Take 5 mLs by mouth 4 (four) times daily as needed for mouth  pain. 01/29/14   Shanker Kristeen Mans, MD  Alum & Mag Hydroxide-Simeth (MAGIC MOUTHWASH) SOLN Take 10 mLs by mouth 3 (three) times daily.    Historical Provider, MD  amLODipine (NORVASC) 10 MG tablet Take 0.5 tablets (5 mg total) by mouth daily. 01/29/14   Shanker Kristeen Mans, MD  amoxicillin (AMOXIL) 500 MG capsule Take 2,000 mg by mouth once. Prior to dental procedure    Historical Provider, MD  aspirin EC 325 MG tablet Take 1 tablet (325 mg total) by mouth 2 (two) times daily. Take for 4 weeks. 05/26/14 July 08, 2014  Danae Orleans, PA-C  augmented betamethasone dipropionate (DIPROLENE-AF) 0.05 % ointment Apply 1 application topically 2 (two) times daily.    Historical Provider, MD  benzonatate (TESSALON) 200 MG capsule Take 1 capsule (200 mg total) by mouth 3 (three) times daily as needed for cough. 01/29/14   Shanker Kristeen Mans, MD  betamethasone dipropionate (DIPROLENE) 0.05 % cream Apply 1 application topically 2 (two) times daily.    Historical Provider, MD  cetirizine (ZYRTEC) 10 MG tablet Take 10 mg by mouth daily.    Historical Provider, MD  chlorhexidine (PERIDEX) 0.12 % solution Use as directed 15 mLs in the mouth or throat. swish 15 mls for 30 sec then spit, twice daily    Historical Provider, MD  chlorpheniramine-HYDROcodone (TUSSIONEX) 10-8 MG/5ML LQCR Take 5 mLs by mouth every 12 (twelve) hours as needed for cough. 01/29/14   Shanker Kristeen Mans, MD  Clocortolone Pivalate (CLODERM) 0.1 % cream Apply 1 application topically 2 (two) times daily. Applies to lesion on left cheek.    Historical Provider, MD  coal tar (NEUTROGENA T-GEL) 0.5 % shampoo Apply 1 application topically See admin instructions. She uses on Tuesday and Friday.    Historical Provider, MD  diphenhydrAMINE-zinc acetate (BENADRYL) cream Apply topically 3 (three) times daily as needed for itching. 05/28/14   Florencia Reasons, MD  donepezil (ARICEPT) 10 MG tablet Take 10 mg by mouth at bedtime.     Historical Provider, MD  Emollient (CERAVE) CREA Apply  1 application topically daily. Applies to dry skin.    Historical Provider, MD  escitalopram (LEXAPRO) 20 MG tablet Take 20 mg by mouth every morning.     Historical Provider, MD  feeding supplement, RESOURCE BREEZE, (RESOURCE BREEZE) LIQD Take 1 Container by mouth 3 (three) times daily between meals. 05/28/14   Florencia Reasons, MD  ferrous sulfate 325 (65 FE) MG tablet Take 1 tablet (325 mg total) by mouth 3 (three) times daily after meals. 05/28/14   Florencia Reasons, MD  fluticasone (FLONASE) 50 MCG/ACT nasal spray Place 2 sprays into both nostrils daily.    Historical Provider, MD  gabapentin (NEURONTIN) 100 MG capsule Take 200 mg by mouth at bedtime.     Historical Provider, MD  HYDROcodone-acetaminophen (NORCO) 5-325 MG per tablet Take 1-2 tablets by mouth every 4 (four) hours as needed for moderate pain. 05/26/14   Danae Orleans, PA-C  hydrOXYzine (ATARAX/VISTARIL) 25 MG tablet Take 25 mg by mouth at bedtime.    Historical Provider, MD  levothyroxine (SYNTHROID, LEVOTHROID) 88 MCG tablet Take 88 mcg by mouth daily before breakfast.     Historical Provider, MD  lidocaine (LIDODERM) 5 % Place 1 patch onto the skin daily. Remove & Discard patch within 12 hours or as directed by MD 01/29/14   Jonetta Osgood, MD  LORazepam (ATIVAN) 2 MG/ML concentrated solution Take 0.5 mLs (1 mg total) by mouth every 6 (six) hours as needed for anxiety or sedation. 01/29/14   Shanker Kristeen Mans, MD  Morphine Sulfate (MORPHINE CONCENTRATE) 10 mg / 0.5 ml concentrated solution Take 0.25 mLs (5 mg total) by mouth every 4 (four) hours as needed for severe pain, anxiety or shortness of breath. 01/29/14   Shanker Kristeen Mans, MD  Multiple Vitamins-Minerals (PRESERVISION AREDS) CAPS Take 1 capsule by mouth 2 (two) times daily with a meal.     Historical Provider, MD  olopatadine (PATANOL) 0.1 % ophthalmic solution Place 1 drop into both eyes 2 (two) times daily as needed. For dry eyes  Historical Provider, MD  polyethylene glycol (MIRALAX /  GLYCOLAX) packet Take 17 g by mouth every morning.     Historical Provider, MD  pramoxine-mineral oil-zinc (TUCKS) 1-12.5 % rectal ointment Place 1 application rectally every 2 (two) hours as needed for itching (itching).    Historical Provider, MD  pravastatin (PRAVACHOL) 40 MG tablet Take 1 tablet (40 mg total) by mouth every evening. 03/02/13   Lelon Perla, MD  Propylene Glycol (SYSTANE BALANCE) 0.6 % SOLN Place 1 drop into both eyes daily as needed (For dry or itchy eyes.). One drop both eye as needed    Historical Provider, MD  senna-docusate (SENOKOT S) 8.6-50 MG per tablet Take 1-2 tablets by mouth See admin instructions. She takes one tablet in the morning and two tablets at bedtime.    Historical Provider, MD  tiotropium (SPIRIVA) 18 MCG inhalation capsule Place 18 mcg into inhaler and inhale daily.    Historical Provider, MD   BP 181/63 mmHg  Pulse 70  Temp(Src) 99.2 F (37.3 C) (Oral)  Resp 20  Ht 5\' 4"  (1.626 m)  Wt 153 lb (69.4 kg)  BMI 26.25 kg/m2  SpO2 91% Physical Exam  Constitutional: She is oriented to person, place, and time. She appears well-developed and well-nourished.  Frail, cachectic appearing patient.   HENT:  Head: Normocephalic.  Eyes:  Conjunctival pallor.  Neck: Normal range of motion. Neck supple.  Cardiovascular: Normal rate.   No murmur heard. Pulmonary/Chest: Effort normal. She exhibits no tenderness.  Coarse breath sounds and rhonchi throughout. Actively coughing with bloody production.  Abdominal: Soft. There is no tenderness.  Musculoskeletal: Normal range of motion.  Neurological: She is alert and oriented to person, place, and time.  Skin: Skin is warm and dry.    ED Course  Procedures (including critical care time) Labs Review Labs Reviewed - No data to display  Imaging Review No results found.   EKG Interpretation None     Results for orders placed or performed during the hospital encounter of 06/07/14  CBC with Differential   Result Value Ref Range   WBC 12.9 (H) 4.0 - 10.5 K/uL   RBC 3.67 (L) 3.87 - 5.11 MIL/uL   Hemoglobin 9.6 (L) 12.0 - 15.0 g/dL   HCT 32.0 (L) 36.0 - 46.0 %   MCV 87.2 78.0 - 100.0 fL   MCH 26.2 26.0 - 34.0 pg   MCHC 30.0 30.0 - 36.0 g/dL   RDW 23.8 (H) 11.5 - 15.5 %   Platelets 442 (H) 150 - 400 K/uL   Neutrophils Relative % 66 43 - 77 %   Lymphocytes Relative 14 12 - 46 %   Monocytes Relative 8 3 - 12 %   Eosinophils Relative 12 (H) 0 - 5 %   Basophils Relative 0 0 - 1 %   Neutro Abs 8.6 (H) 1.7 - 7.7 K/uL   Lymphs Abs 1.8 0.7 - 4.0 K/uL   Monocytes Absolute 1.0 0.1 - 1.0 K/uL   Eosinophils Absolute 1.5 (H) 0.0 - 0.7 K/uL   Basophils Absolute 0.0 0.0 - 0.1 K/uL   RBC Morphology POLYCHROMASIA PRESENT   Comprehensive metabolic panel  Result Value Ref Range   Sodium 138 135 - 145 mmol/L   Potassium 4.0 3.5 - 5.1 mmol/L   Chloride 106 96 - 112 mmol/L   CO2 24 19 - 32 mmol/L   Glucose, Bld 93 70 - 99 mg/dL   BUN 10 6 - 23 mg/dL   Creatinine, Ser  1.14 (H) 0.50 - 1.10 mg/dL   Calcium 8.8 8.4 - 10.5 mg/dL   Total Protein 6.5 6.0 - 8.3 g/dL   Albumin 2.5 (L) 3.5 - 5.2 g/dL   AST 17 0 - 37 U/L   ALT 8 0 - 35 U/L   Alkaline Phosphatase 83 39 - 117 U/L   Total Bilirubin 0.5 0.3 - 1.2 mg/dL   GFR calc non Af Amer 40 (L) >90 mL/min   GFR calc Af Amer 47 (L) >90 mL/min   Anion gap 8 5 - 15  Protime-INR  Result Value Ref Range   Prothrombin Time 15.4 (H) 11.6 - 15.2 seconds   INR 1.21 0.00 - 1.49  APTT  Result Value Ref Range   aPTT 27 24 - 37 seconds   Ct Angio Chest Pe W/cm &/or Wo Cm  06/08/2014   CLINICAL DATA:  Hemoptysis.  EXAM: CT ANGIOGRAPHY CHEST WITH CONTRAST  TECHNIQUE: Multidetector CT imaging of the chest was performed using the standard protocol during bolus administration of intravenous contrast. Multiplanar CT image reconstructions and MIPs were obtained to evaluate the vascular anatomy.  CONTRAST:  43mL OMNIPAQUE IOHEXOL 350 MG/ML SOLN  COMPARISON:  01/22/2014   FINDINGS: There are multiple small pulmonary emboli, involving the right upper lobe, right middle lobe, right lower lobe, left lower lobe and left upper lobe. No central pulmonary emboli are evident. There is no evidence of right heart strain.  The right lower lobe mass is now obscured by a larger area of basilar consolidation. There is a small right pleural effusion, new. The left lung is clear except for minor basilar atelectatic linear opacities. The large hiatal hernia is again evident. No significant skeletal lesions are evident.  Review of the MIP images confirms the above findings.  IMPRESSION: 1. Multiple small pulmonary emboli involving both lungs. No large central emboli. No evidence of right heart strain. 2. Extensive new consolidation in the right lower lobe posteriorly, partially obscuring the known right lower lobe mass. 3. Small new right pleural effusion. 4. Large hiatal hernia   Electronically Signed   By: Andreas Newport M.D.   On: 06/08/2014 01:03   Pelvis Portable  05/26/2014   CLINICAL DATA:  Post RIGHT hip hemiarthroplasty  EXAM: PORTABLE PELVIS 1-2 VIEWS  COMPARISON:  Portable exam 1912 hours compared to preoperative images of 05/25/2014  FINDINGS: Interval resection of RIGHT femoral head/neck and placement of a femoral prosthesis.  Bones demineralized.  No acute fracture dislocation.  IMPRESSION: RIGHT hip prosthesis without acute complication.   Electronically Signed   By: Lavonia Dana M.D.   On: 05/26/2014 19:31   Dg Chest Port 1 View  06/07/2014   CLINICAL DATA:  Initial evaluation hemoptysis, history of lung cancer and COPD  EXAM: PORTABLE CHEST - 1 VIEW  COMPARISON:  05/25/2014  FINDINGS: Mild to moderate cardiac enlargement stable. Vascular pattern normal. Calcification of the aortic arch unchanged. Left lung shows no significant opacities other than mild atelectasis the lower lobe.  There is infiltrate throughout the right mid to lower lung zone. This is new from the prior study.  Moderate hiatal hernia noted again.  IMPRESSION: Fairly extensive infiltrate right mid to lower lung zone representing a change from prior studies. Pulmonary hemorrhage or pneumonia could both causes appearance.   Electronically Signed   By: Skipper Cliche M.D.   On: 06/07/2014 21:58   Dg Chest Portable 1 View  05/25/2014   CLINICAL DATA:  Hip fracture.  Hip injury.  EXAM:  PORTABLE CHEST - 1 VIEW  COMPARISON:  01/28/2014.  FINDINGS: Cardiopericardial silhouette upper limits of normal for projection. Aortic arch atherosclerosis. Fluid level is present in the inferior LEFT chest, compatible with moderate hiatal hernia and gastric fluid level. RIGHT basilar perihilar airspace opacity which may represent edema, aspiration, or pneumonia. This extends to the RIGHT costophrenic angle.  Monitoring leads project over the chest. No focal consolidation in the LEFT lung.  IMPRESSION: 1. RIGHT infrahilar and perihilar airspace disease may represent asymmetric pulmonary edema, aspiration or pneumonia. 2. Moderate hiatal hernia with fluid level in the LEFT chest.   Electronically Signed   By: Dereck Ligas M.D.   On: 05/25/2014 20:04   Dg Hip Unilat With Pelvis 2-3 Views Right  05/25/2014   CLINICAL DATA:  One week history of right hip pain without known trauma  EXAM: RIGHT HIP (WITH PELVIS) 2-3 VIEWS  COMPARISON:  Pelvis and left hip of March 29, 2014  FINDINGS: There is a mildly impacted fracture of the neck of the right femoral head. The femoral head remains appropriately related to the acetabulum. The intertrochanteric region of the right femur is intact. The bony pelvis is osteopenic. There is no lytic or blastic lesion nor acute fracture. There are degenerative changes of the lower lumbar spine. The observed portions of the sacrum are unremarkable. The left hip is unremarkable.  IMPRESSION: 1. There is an acute impacted slightly angulated subcapital fracture of the right hip. 2. There is no acute fracture of the bony  pelvis. 3. These results will be called to the ordering clinician or representative by the Radiologist Assistant, and communication documented in the PACS or zVision Dashboard.   Electronically Signed   By: David  Martinique   On: 05/25/2014 16:21    MDM   Final diagnoses:  None    1. Hemoptysis 2. Lung consolidation 3. H/o lung CA  VS remain stable, low grade fever later in visit. She is comfortable on multiple re-evaluations.   CXR showing ?pulmonary hemorrhage. CT Angio performed after discussion with the patient on further care. CT showing multiple small PE's (no central emboli) and a large consolidation overlying known mass.   Discussed with Dr. Hal Hope who accepts the patient for admission. Discussed with pulmonary critical care regarding recommendations for treatment given complicated picture. Recommended no anticoagulation or antibiotic treatment at this time.   Patient admitted to medicine.    Charlann Lange, PA-C 06/08/14 0422  Malvin Johns, MD 06/10/14 1501

## 2014-06-07 NOTE — ED Notes (Signed)
IV attempted by 2nd RN without success.

## 2014-06-08 ENCOUNTER — Emergency Department (HOSPITAL_COMMUNITY)

## 2014-06-08 ENCOUNTER — Encounter (HOSPITAL_COMMUNITY): Payer: Self-pay

## 2014-06-08 DIAGNOSIS — C3490 Malignant neoplasm of unspecified part of unspecified bronchus or lung: Secondary | ICD-10-CM | POA: Diagnosis present

## 2014-06-08 DIAGNOSIS — H540 Blindness, both eyes: Secondary | ICD-10-CM | POA: Diagnosis present

## 2014-06-08 DIAGNOSIS — Z66 Do not resuscitate: Secondary | ICD-10-CM | POA: Diagnosis present

## 2014-06-08 DIAGNOSIS — Z515 Encounter for palliative care: Secondary | ICD-10-CM | POA: Diagnosis not present

## 2014-06-08 DIAGNOSIS — N183 Chronic kidney disease, stage 3 (moderate): Secondary | ICD-10-CM | POA: Diagnosis present

## 2014-06-08 DIAGNOSIS — R042 Hemoptysis: Secondary | ICD-10-CM

## 2014-06-08 DIAGNOSIS — J449 Chronic obstructive pulmonary disease, unspecified: Secondary | ICD-10-CM | POA: Diagnosis present

## 2014-06-08 DIAGNOSIS — R918 Other nonspecific abnormal finding of lung field: Secondary | ICD-10-CM

## 2014-06-08 DIAGNOSIS — F0390 Unspecified dementia without behavioral disturbance: Secondary | ICD-10-CM | POA: Diagnosis present

## 2014-06-08 DIAGNOSIS — J42 Unspecified chronic bronchitis: Secondary | ICD-10-CM

## 2014-06-08 DIAGNOSIS — Z961 Presence of intraocular lens: Secondary | ICD-10-CM | POA: Diagnosis present

## 2014-06-08 DIAGNOSIS — I4891 Unspecified atrial fibrillation: Secondary | ICD-10-CM | POA: Diagnosis present

## 2014-06-08 DIAGNOSIS — Z96641 Presence of right artificial hip joint: Secondary | ICD-10-CM | POA: Diagnosis present

## 2014-06-08 DIAGNOSIS — D649 Anemia, unspecified: Secondary | ICD-10-CM | POA: Diagnosis present

## 2014-06-08 DIAGNOSIS — I129 Hypertensive chronic kidney disease with stage 1 through stage 4 chronic kidney disease, or unspecified chronic kidney disease: Secondary | ICD-10-CM | POA: Diagnosis present

## 2014-06-08 DIAGNOSIS — M1990 Unspecified osteoarthritis, unspecified site: Secondary | ICD-10-CM | POA: Diagnosis present

## 2014-06-08 DIAGNOSIS — E785 Hyperlipidemia, unspecified: Secondary | ICD-10-CM | POA: Diagnosis present

## 2014-06-08 DIAGNOSIS — I350 Nonrheumatic aortic (valve) stenosis: Secondary | ICD-10-CM | POA: Diagnosis present

## 2014-06-08 DIAGNOSIS — F329 Major depressive disorder, single episode, unspecified: Secondary | ICD-10-CM | POA: Diagnosis present

## 2014-06-08 DIAGNOSIS — Z87891 Personal history of nicotine dependence: Secondary | ICD-10-CM | POA: Diagnosis not present

## 2014-06-08 DIAGNOSIS — K219 Gastro-esophageal reflux disease without esophagitis: Secondary | ICD-10-CM | POA: Diagnosis present

## 2014-06-08 DIAGNOSIS — Z7982 Long term (current) use of aspirin: Secondary | ICD-10-CM | POA: Diagnosis not present

## 2014-06-08 DIAGNOSIS — Z79899 Other long term (current) drug therapy: Secondary | ICD-10-CM | POA: Diagnosis not present

## 2014-06-08 DIAGNOSIS — I2699 Other pulmonary embolism without acute cor pulmonale: Secondary | ICD-10-CM | POA: Diagnosis not present

## 2014-06-08 DIAGNOSIS — I6529 Occlusion and stenosis of unspecified carotid artery: Secondary | ICD-10-CM | POA: Diagnosis present

## 2014-06-08 DIAGNOSIS — M109 Gout, unspecified: Secondary | ICD-10-CM | POA: Diagnosis present

## 2014-06-08 DIAGNOSIS — F419 Anxiety disorder, unspecified: Secondary | ICD-10-CM | POA: Diagnosis present

## 2014-06-08 DIAGNOSIS — E039 Hypothyroidism, unspecified: Secondary | ICD-10-CM | POA: Diagnosis present

## 2014-06-08 DIAGNOSIS — Z9841 Cataract extraction status, right eye: Secondary | ICD-10-CM | POA: Diagnosis not present

## 2014-06-08 DIAGNOSIS — G40909 Epilepsy, unspecified, not intractable, without status epilepticus: Secondary | ICD-10-CM | POA: Diagnosis present

## 2014-06-08 DIAGNOSIS — Z85828 Personal history of other malignant neoplasm of skin: Secondary | ICD-10-CM | POA: Diagnosis not present

## 2014-06-08 DIAGNOSIS — I509 Heart failure, unspecified: Secondary | ICD-10-CM | POA: Diagnosis present

## 2014-06-08 DIAGNOSIS — G629 Polyneuropathy, unspecified: Secondary | ICD-10-CM | POA: Diagnosis present

## 2014-06-08 DIAGNOSIS — Z9842 Cataract extraction status, left eye: Secondary | ICD-10-CM | POA: Diagnosis not present

## 2014-06-08 DIAGNOSIS — K920 Hematemesis: Secondary | ICD-10-CM | POA: Diagnosis present

## 2014-06-08 DIAGNOSIS — Z8673 Personal history of transient ischemic attack (TIA), and cerebral infarction without residual deficits: Secondary | ICD-10-CM | POA: Diagnosis not present

## 2014-06-08 LAB — PROTIME-INR
INR: 1.21 (ref 0.00–1.49)
Prothrombin Time: 15.4 seconds — ABNORMAL HIGH (ref 11.6–15.2)

## 2014-06-08 LAB — APTT: aPTT: 27 seconds (ref 24–37)

## 2014-06-08 MED ORDER — PRAVASTATIN SODIUM 40 MG PO TABS
40.0000 mg | ORAL_TABLET | Freq: Every evening | ORAL | Status: DC
  Filled 2014-06-08: qty 1

## 2014-06-08 MED ORDER — ONDANSETRON HCL 4 MG PO TABS: 4.0000 mg | ORAL_TABLET | Freq: Four times a day (QID) | ORAL | Status: DC | PRN

## 2014-06-08 MED ORDER — ESCITALOPRAM OXALATE 10 MG PO TABS
20.0000 mg | ORAL_TABLET | Freq: Every morning | ORAL | Status: DC
  Administered 2014-06-08: 20 mg via ORAL
  Filled 2014-06-08: qty 2

## 2014-06-08 MED ORDER — BENZONATATE 100 MG PO CAPS: 200.0000 mg | ORAL_CAPSULE | Freq: Three times a day (TID) | ORAL | Status: DC | PRN

## 2014-06-08 MED ORDER — ACETAMINOPHEN 650 MG RE SUPP: 650.0000 mg | Freq: Four times a day (QID) | RECTAL | Status: DC | PRN

## 2014-06-08 MED ORDER — LIDOCAINE 5 % EX PTCH
1.0000 | MEDICATED_PATCH | CUTANEOUS | Status: DC | PRN
  Filled 2014-06-08: qty 1

## 2014-06-08 MED ORDER — LORAZEPAM 2 MG/ML PO CONC: 1.0000 mg | Freq: Four times a day (QID) | ORAL | Status: DC | PRN

## 2014-06-08 MED ORDER — MORPHINE SULFATE (CONCENTRATE) 10 MG /0.5 ML PO SOLN: 5.0000 mg | ORAL | Status: DC | PRN

## 2014-06-08 MED ORDER — NYSTATIN 100000 UNIT/GM EX POWD
Freq: Two times a day (BID) | CUTANEOUS | Status: DC
  Filled 2014-06-08: qty 15

## 2014-06-08 MED ORDER — TRIAMCINOLONE ACETONIDE 0.5 % EX OINT: 1.0000 "application " | TOPICAL_OINTMENT | Freq: Two times a day (BID) | CUTANEOUS | Status: DC | PRN

## 2014-06-08 MED ORDER — SENNOSIDES-DOCUSATE SODIUM 8.6-50 MG PO TABS
1.0000 | ORAL_TABLET | Freq: Every day | ORAL | Status: DC
  Administered 2014-06-08: 1 via ORAL
  Filled 2014-06-08: qty 1

## 2014-06-08 MED ORDER — ALBUTEROL SULFATE (2.5 MG/3ML) 0.083% IN NEBU: 2.5000 mg | INHALATION_SOLUTION | RESPIRATORY_TRACT | Status: DC | PRN

## 2014-06-08 MED ORDER — POLYVINYL ALCOHOL 1.4 % OP SOLN: 1.0000 [drp] | Freq: Every day | OPHTHALMIC | Status: DC | PRN

## 2014-06-08 MED ORDER — IOHEXOL 350 MG/ML SOLN
80.0000 mL | Freq: Once | INTRAVENOUS | Status: AC | PRN
  Administered 2014-06-08: 80 mL via INTRAVENOUS

## 2014-06-08 MED ORDER — LEVOTHYROXINE SODIUM 88 MCG PO TABS
88.0000 ug | ORAL_TABLET | Freq: Every day | ORAL | Status: DC
  Administered 2014-06-08: 88 ug via ORAL
  Filled 2014-06-08 (×2): qty 1

## 2014-06-08 MED ORDER — FERROUS SULFATE 325 (65 FE) MG PO TABS
325.0000 mg | ORAL_TABLET | Freq: Three times a day (TID) | ORAL | Status: DC
  Administered 2014-06-08 (×2): 325 mg via ORAL
  Filled 2014-06-08 (×4): qty 1

## 2014-06-08 MED ORDER — HYDROCODONE-ACETAMINOPHEN 5-325 MG PO TABS
1.0000 | ORAL_TABLET | ORAL | Status: DC | PRN
  Administered 2014-06-08: 1 via ORAL
  Filled 2014-06-08: qty 1

## 2014-06-08 MED ORDER — LORATADINE 10 MG PO TABS
10.0000 mg | ORAL_TABLET | Freq: Every day | ORAL | Status: DC
  Administered 2014-06-08: 10 mg via ORAL
  Filled 2014-06-08: qty 1

## 2014-06-08 MED ORDER — ONDANSETRON HCL 4 MG/2ML IJ SOLN: 4.0000 mg | Freq: Four times a day (QID) | INTRAMUSCULAR | Status: DC | PRN

## 2014-06-08 MED ORDER — ALBUTEROL SULFATE HFA 108 (90 BASE) MCG/ACT IN AERS: 2.0000 | INHALATION_SPRAY | Freq: Four times a day (QID) | RESPIRATORY_TRACT | Status: DC | PRN

## 2014-06-08 MED ORDER — SENNOSIDES-DOCUSATE SODIUM 8.6-50 MG PO TABS
2.0000 | ORAL_TABLET | Freq: Every day | ORAL | Status: DC
  Filled 2014-06-08: qty 2

## 2014-06-08 MED ORDER — HYDROCOD POLST-CHLORPHEN POLST 10-8 MG/5ML PO LQCR: 5.0000 mL | Freq: Two times a day (BID) | ORAL | Status: DC | PRN

## 2014-06-08 MED ORDER — PRAMOXINE-ZINC OXIDE IN MO 1-12.5 % RE OINT: 1.0000 "application " | TOPICAL_OINTMENT | RECTAL | Status: DC | PRN

## 2014-06-08 MED ORDER — POLYETHYLENE GLYCOL 3350 17 G PO PACK
17.0000 g | PACK | Freq: Every morning | ORAL | Status: DC
  Administered 2014-06-08: 17 g via ORAL
  Filled 2014-06-08: qty 1

## 2014-06-08 MED ORDER — OCUVITE-LUTEIN PO CAPS
1.0000 | ORAL_CAPSULE | Freq: Two times a day (BID) | ORAL | Status: DC
  Administered 2014-06-08: 1 via ORAL
  Filled 2014-06-08 (×3): qty 1

## 2014-06-08 MED ORDER — FLUTICASONE PROPIONATE 50 MCG/ACT NA SUSP
2.0000 | Freq: Every day | NASAL | Status: DC
  Administered 2014-06-08: 2 via NASAL
  Filled 2014-06-08: qty 16

## 2014-06-08 MED ORDER — DONEPEZIL HCL 5 MG PO TABS: 10.0000 mg | ORAL_TABLET | Freq: Every day | ORAL | Status: DC

## 2014-06-08 MED ORDER — ACETAMINOPHEN 325 MG PO TABS: 650.0000 mg | ORAL_TABLET | Freq: Four times a day (QID) | ORAL | Status: DC | PRN

## 2014-06-08 MED ORDER — HYDROXYZINE HCL 25 MG PO TABS: 25.0000 mg | ORAL_TABLET | Freq: Every day | ORAL | Status: DC

## 2014-06-08 MED ORDER — GABAPENTIN 100 MG PO CAPS
200.0000 mg | ORAL_CAPSULE | Freq: Every day | ORAL | Status: DC
  Filled 2014-06-08: qty 2

## 2014-06-08 MED ORDER — BIOTENE MOISTURIZING MOUTH MT SOLN: 2.0000 | Freq: Four times a day (QID) | OROMUCOSAL | Status: DC | PRN

## 2014-06-08 NOTE — ED Provider Notes (Signed)
Emergency Ultrasound Study:   Angiocath insertion Performed by: Mariea Clonts  Consent: Verbal consent obtained. Risks and benefits: risks, benefits and alternatives were discussed Immediately prior to procedure the correct patient, procedure, equipment, support staff and site/side marked as needed.  Indication: difficult IV access Preparation: Patient was prepped and draped in the usual sterile fashion. Vein Location: right ac vein was visualized during assessment for potential access sites and was found to be patent/ easily compressed with linear ultrasound.  The needle was visualized with real-time ultrasound and guided into the vein. Gauge: 20 g  Image saved and stored.  Normal blood return.  Patient tolerance: Patient tolerated the procedure well with no immediate complications.   Elnora Morrison Mariea Clonts, MD 06/08/14 [phone]

## 2014-06-08 NOTE — Telephone Encounter (Signed)
Noted  

## 2014-06-08 NOTE — ED Notes (Signed)
Contacted pt daughter for her.

## 2014-06-08 NOTE — ED Notes (Signed)
Contacted [facility] for tx back to [facility]

## 2014-06-08 NOTE — ED Notes (Signed)
Meal given to the patient.

## 2014-06-08 NOTE — Progress Notes (Signed)
Notified by [facility] who received call from [facility] that pt was currently in the [facility] ED sent for hemoptysis.  Pt seen in Rm [facility] awake alert to place, time situation; pt does state she has been getting things jumbled; she is tired and hasn't slept she was wondering why she has not been sent to a room. ED Staff had discussed with pt- hospital has been full and were waiting on room availability.   Pt denied any pain but did state her coughing spells and the amount of blood seemed to be worse and she wanted to talk with her pulmonary doctor. Also present at time of visit was attending Dr Wynelle Cleveland, she discussed with pt current status of Lung cancer and that the hemoptysis could vary and may get worse; she contacted Dr Halford Chessman, Pulmonary and placed him on speaker phone for pt discussion - during this discussion pt voiced understanding that there was not more testing or work-up to be done, nor did she want this - she understood hospice would continue to follow her at [facility] - pt currently is recovering from hip surgery and is at the [facility] unit.  Ms Wine-Shelton voiced some confusion about reason for being sent back to the SNF; though she was able to clearly state that she understood from her discussions with Dr Halford Chessman, today and previously that there was no further testing or treatment to be done for her lung cancer nor did she want this if offered.  Dr Wynelle Cleveland did explain to pt that there did not seem to be a reason to admit her and Ms Yurkovich voiced understanding. Per further discussion with pt it seems she gets anxious with increased coughing episodes.  At the request of Ms Quevedo, this Probation officer contacted Manuela Schwartz, staff at [facility] who also spoke with pt - Manuela Schwartz is aware pt will be discharging back to the SNF; Manuela Schwartz indicted it was the pt who insisted on coming to the ED last evening though the pt does not remember that; discussed with pt and Manuela Schwartz staff placing a call to [facility] and if possible,  allowing [facility] RN to make visit to address concerns prior to coming to hospital to try to address concerns without additional hospital visit as pt has stated in past she does not wish for re-hospitalizations. [facility] staff and pt aware [facility] team RN and SW will f/u tomorrow and continue to address any pt/staff concerns.  Per [facility] notes pt currently has PRN medications in place for anxiety, pain/SOB and PRN O2 and nebulizer treatments at facility. Pt and [facility] staff member Manuela Schwartz aware [facility] Team has been notified of plan to return to facility this afternoon by [facility] and team will follow up with pt in the AM. Please contact [facility] at [phone] with any hospice needs  Danton Sewer, RN MSN [facility] [phone]

## 2014-06-08 NOTE — ED Notes (Signed)
Admitting and Palliative Care at the bedside.

## 2014-06-08 NOTE — H&P (Signed)
[facility] History and Physical  [name] WNI:[id number] DOB: September 28, 1919 DOA: 06/07/2014  Referring physician: ER physician. PCP: Kandice Hams, MD   Chief Complaint: Coughing up blood.  HPI: [name] is a 81 y.o. female with history of COPD, hypothyroidism, hyperlipidemia, lung mass which patient does not want to be treated was recently admitted for right hip fracture and had undergone ORIF was brought to the ER from rehabilitation after patient was having worsening hemoptysis. Patient has been having hemoptysis off and on for last 3-4 months with a lung mass which patient does not want to be treated. But over the last 24-48 hours patient's hemoptysis worsen. Patient was on aspirin for DVT prophylaxis which was discontinued 24 hours ago as per the patient. Due to persistent hemoptysis more than usual patient was brought to the ER. In the ER patient underwent CT angiogram of the chest which showed pulmonary embolism and also mass with consolidation. Pulmonary critical care was consulted by ER physician and at this time pulmonary critical care for the patient is not a candidate for anticoagulation secondary to active hemoptysis. Patient at this time does not want any active treatment and will be admitted for observation and management.   Review of Systems: As presented in the history of presenting illness, rest negative.  Past Medical History  Diagnosis Date  . Cholelithiases   . Hypothyroid   . Leg paresthesia   . Aortic valve stenosis   . Arthritis   . Hyperlipidemia   . Dementia   . Carotid artery occlusion   . Renal disorder   . Hypertension   . COPD (chronic obstructive pulmonary disease)   . SEIZURE DISORDER 11/05/2006  . GERD 11/05/2006  . CVA 02/07/2007  . Macular degeneration of both eyes   . Blind in both eyes   . Peripheral neuropathy   . Pneumonia     "many times"  . Internal hemorrhoid, bleeding   . Malaria     "as a child"  . Skin cancer      "nose, face mostly"  . Chronic lower back pain     "when I go to the bathroom"  . Intestinal infection due to Clostridium difficile 09/2011  . Benign neoplasm of breast   . Gout, unspecified   . Anemia   . Anxiety   . Other dysfunctions of sleep stages or arousal from sleep   . Depression   . Chronic rhinitis   . Abscess of lung(513.0)     RUL  . Diaphragmatic hernia without mention of obstruction or gangrene   . Diverticulosis of colon (without mention of hemorrhage)   . Acute duodenal ulcer with perforation and obstruction 08/2011    perforation  . Hemorrhage of rectum and anus   . Abscess of intestine 09/2011    drainage  . Other psoriasis   . Lumbago   . Nonspecific elevation of levels of transaminase or lactic acid dehydrogenase (LDH)   . Solitary pulmonary nodule     RLL nodule 1 cm  . Transient ischemic attack (TIA), and cerebral infarction without residual deficits(V12.54)   . Long term (current) use of anticoagulants   . Sacroiliitis 08/27/2012  . Anemia, iron deficiency 10/16/2011  . Congestive heart failure   . Spondylosis of lumbosacral region 09/24/2012    L/S xray 08/2012: Mild to moderate osteoarthritis and degenerative spondylosis he diffusely. Anterior subluxation of L4 relative to L5 at 5.7 cm. Mild to moderate levoconvex curvature the mid lumbar spine    .  Dysrhythmia     hx atrial fib  . Arteritis     hx giant cell arteritis  . Aortic stenosis, severe 03/02/2013    Followed since 2004 with mild aortic valve stenosis and mild regurgitation. Echocardiograms 2006-2013 show  progression from mild to moderate aortic stenosis, mild insufficiency. Mild to moderate mitral valve regurgitation evident in 2013.   07/25/11: Worsening valve disease by echocardiogram, pt is not interested in invasive treatment. Symptoms appear stable for now. Continue to monitor      Past Surgical History  Procedure Laterality Date  . Vesicovaginal fistula closure w/ tah    . Tonsillectomy     . Breast lumpectomy      bilaterally  . Cataract extraction w/ intraocular lens  implant, bilateral    . Skin cancer excision      "nose and face"  . Abdominal hysterectomy    . Hemorrhoid surgery  11/2008    banding  . Hip surgery Right 05/26/2014  . Hip arthroplasty Right 05/26/2014    Procedure: ARTHROPLASTY BIPOLAR HIP;  Surgeon: Mauri Pole, MD;  Location: [facility] [facility];  Service: Orthopedics;  Laterality: Right;   Social History:  reports that she quit smoking about 20 years ago. Her smoking use included Cigarettes. She has a 50 pack-year smoking history. She has never used smokeless tobacco. She reports that she drinks alcohol. She reports that she does not use illicit drugs. Where does patient live rehabilitation. Can patient participate in ADLs? No.  Allergies  Allergen Reactions  . Cortisone Other (See Comments)    Rash   . Minocin [Minocycline Hcl] Other (See Comments)    Unknown  . Minocycline Hcl     unknown  . Neomycin-Bacitracin Zn-Polymyx     unknown  . Neosporin [Neomycin-Bacitracin Zn-Polymyx] Other (See Comments)    Unknown  . Other Other (See Comments)    Steroidal Neuromuscular Blockers  . Tetanus Toxoids Other (See Comments)    Unknown    Family History:  Family History  Problem Relation Age of Onset  . Hyperlipidemia    . Hypertension    . Stroke    . Colon cancer Daughter   . Heart attack Father   . Kidney failure Father   . Heart disease Son       Prior to Admission medications   Medication Sig Start Date End Date Taking? Authorizing Provider  acetaminophen (TYLENOL) 325 MG tablet Take 650 mg by mouth every 6 (six) hours as needed for mild pain.    Yes Historical Provider, MD  acyclovir ointment (ZOVIRAX) 5 % Apply 1 application topically every 3 (three) hours as needed. For cold sores   Yes Historical Provider, MD  albuterol (PROVENTIL HFA;VENTOLIN HFA) 108 (90 BASE) MCG/ACT inhaler Inhale 2 puffs into the lungs every 6 (six) hours as needed for  wheezing or shortness of breath.   Yes Historical Provider, MD  albuterol (PROVENTIL) (2.5 MG/3ML) 0.083% nebulizer solution Take 3 mLs (2.5 mg total) by nebulization every 2 (two) hours as needed for wheezing. 01/29/14  Yes Shanker Kristeen Mans, MD  Alum & Mag Hydroxide-Simeth (MAGIC MOUTHWASH W/LIDOCAINE) SOLN Take 5 mLs by mouth 4 (four) times daily as needed for mouth pain. Patient taking differently: Take 5 mLs by mouth 3 (three) times daily.  01/29/14  Yes Shanker Kristeen Mans, MD  amoxicillin (AMOXIL) 500 MG capsule Take 2,000 mg by mouth once. Prior to dental procedure   Yes Historical Provider, MD  Artificial Saliva (BIOTENE MOISTURIZING MOUTH) SOLN Use  as directed 2 sprays in the mouth or throat every 6 (six) hours as needed (for dry mouth).   Yes Historical Provider, MD  augmented betamethasone dipropionate (DIPROLENE-AF) 0.05 % ointment Apply 1 application topically 2 (two) times daily as needed (for itching).    Yes Historical Provider, MD  benzonatate (TESSALON) 200 MG capsule Take 1 capsule (200 mg total) by mouth 3 (three) times daily as needed for cough. 01/29/14  Yes Shanker Kristeen Mans, MD  cetirizine (ZYRTEC) 10 MG tablet Take 10 mg by mouth daily.   Yes Historical Provider, MD  chlorhexidine (PERIDEX) 0.12 % solution Use as directed 15 mLs in the mouth or throat. swish 15 mls for 30 sec then spit, twice daily   Yes Historical Provider, MD  chlorpheniramine-HYDROcodone (TUSSIONEX) 10-8 MG/5ML LQCR Take 5 mLs by mouth every 12 (twelve) hours as needed for cough. 01/29/14  Yes Shanker Kristeen Mans, MD  coal tar (NEUTROGENA T-GEL) 0.5 % shampoo Apply 1 application topically See admin instructions. She uses on Tuesday and Friday.   Yes Historical Provider, MD  diphenhydrAMINE-zinc acetate (BENADRYL) cream Apply topically 3 (three) times daily as needed for itching. 05/28/14  Yes Florencia Reasons, MD  donepezil (ARICEPT) 10 MG tablet Take 10 mg by mouth at bedtime.    Yes Historical Provider, MD  Emollient  (CERAVE) CREA Apply 1 application topically daily. Applies to dry skin.   Yes Historical Provider, MD  escitalopram (LEXAPRO) 20 MG tablet Take 20 mg by mouth every morning.    Yes Historical Provider, MD  ferrous sulfate 325 (65 FE) MG tablet Take 1 tablet (325 mg total) by mouth 3 (three) times daily after meals. 05/28/14  Yes Florencia Reasons, MD  fluticasone (FLONASE) 50 MCG/ACT nasal spray Place 2 sprays into both nostrils daily.   Yes Historical Provider, MD  gabapentin (NEURONTIN) 100 MG capsule Take 200 mg by mouth at bedtime.    Yes Historical Provider, MD  HYDROcodone-acetaminophen (NORCO) 5-325 MG per tablet Take 1-2 tablets by mouth every 4 (four) hours as needed for moderate pain. 05/26/14  Yes Danae Orleans, PA-C  hydrOXYzine (ATARAX/VISTARIL) 25 MG tablet Take 25 mg by mouth at bedtime.   Yes Historical Provider, MD  levothyroxine (SYNTHROID, LEVOTHROID) 88 MCG tablet Take 88 mcg by mouth daily before breakfast.    Yes Historical Provider, MD  LORazepam (ATIVAN) 2 MG/ML concentrated solution Take 0.5 mLs (1 mg total) by mouth every 6 (six) hours as needed for anxiety or sedation. 01/29/14  Yes Shanker Kristeen Mans, MD  Morphine Sulfate (MORPHINE CONCENTRATE) 10 mg / 0.5 ml concentrated solution Take 0.25 mLs (5 mg total) by mouth every 4 (four) hours as needed for severe pain, anxiety or shortness of breath. 01/29/14  Yes Shanker Kristeen Mans, MD  Multiple Vitamins-Minerals (PRESERVISION AREDS) CAPS Take 1 capsule by mouth 2 (two) times daily with a meal.    Yes Historical Provider, MD  nystatin (MYCOSTATIN/NYSTOP) 100000 UNIT/GM POWD Apply topically 2 (two) times daily.   Yes Historical Provider, MD  olopatadine (PATANOL) 0.1 % ophthalmic solution Place 1 drop into both eyes 2 (two) times daily as needed. For dry eyes   Yes Historical Provider, MD  polyethylene glycol (MIRALAX / GLYCOLAX) packet Take 17 g by mouth every morning.    Yes Historical Provider, MD  pramoxine-mineral oil-zinc (TUCKS) 1-12.5 %  rectal ointment Place 1 application rectally every 2 (two) hours as needed for itching (itching).   Yes Historical Provider, MD  pravastatin (PRAVACHOL) 40 MG tablet Take  1 tablet (40 mg total) by mouth every evening. 03/02/13  Yes Lelon Perla, MD  Propylene Glycol (SYSTANE BALANCE) 0.6 % SOLN Place 1 drop into both eyes daily as needed (For dry or itchy eyes.). One drop both eye as needed   Yes Historical Provider, MD  senna-docusate (SENOKOT S) 8.6-50 MG per tablet Take 1-2 tablets by mouth See admin instructions. She takes one tablet in the morning and two tablets at bedtime.   Yes Historical Provider, MD  amLODipine (NORVASC) 10 MG tablet Take 0.5 tablets (5 mg total) by mouth daily. Patient not taking: Reported on 06/08/2014 01/29/14   Jonetta Osgood, MD  aspirin EC 325 MG tablet Take 1 tablet (325 mg total) by mouth 2 (two) times daily. Take for 4 weeks. Patient not taking: Reported on 06/08/2014 05/26/14 2014/07/17  Danae Orleans, PA-C  feeding supplement, RESOURCE BREEZE, (RESOURCE BREEZE) LIQD Take 1 Container by mouth 3 (three) times daily between meals. Patient not taking: Reported on 06/08/2014 05/28/14   Florencia Reasons, MD  lidocaine (LIDODERM) 5 % Place 1 patch onto the skin daily. Remove & Discard patch within 12 hours or as directed by MD Patient taking differently: Place 1 patch onto the skin as needed (for pain). Remove & Discard patch within 12 hours or as directed by MD 01/29/14   Jonetta Osgood, MD    Physical Exam: Filed Vitals:   06/08/14 0300 06/08/14 0330 06/08/14 0400 06/08/14 0430  BP: 164/70 165/63 163/64 172/63  Pulse: 66 62 62 61  Temp:      TempSrc:      Resp: 15 18 18 19   Height:      Weight:      SpO2: 98% 98% 93% 99%     General: Moderately built and nourished.  Eyes: Anicteric no pallor.  ENT: No discharge from the ears eyes nose or mouth.  Neck: No mass felt.  Cardiovascular: S1 and S2 heard.  Respiratory no rhonchi or crepitations.  Abdomen: Soft  nontender bowel sounds present.  Skin: There is a small skin lump on the right groin area.  Musculoskeletal: No obvious edema. Right-sided hip skin dressing seen.  Psychiatric: Appears normal.  Neurologic: Alert awake oriented to time place and person. Moves all extremities.  Labs on Admission:  Basic Metabolic Panel:  Recent Labs Lab 06/07/14 2153  NA 138  K 4.0  CL 106  CO2 24  GLUCOSE 93  BUN 10  CREATININE 1.14*  CALCIUM 8.8   Liver Function Tests:  Recent Labs Lab 06/07/14 2153  AST 17  ALT 8  ALKPHOS 83  BILITOT 0.5  PROT 6.5  ALBUMIN 2.5*   No results for input(s): LIPASE, AMYLASE in the last 168 hours. No results for input(s): AMMONIA in the last 168 hours. CBC:  Recent Labs Lab 06/07/14 2153  WBC 12.9*  NEUTROABS 8.6*  HGB 9.6*  HCT 32.0*  MCV 87.2  PLT 442*   Cardiac Enzymes: No results for input(s): CKTOTAL, CKMB, CKMBINDEX, TROPONINI in the last 168 hours.  BNP (last 3 results) No results for input(s): BNP in the last 8760 hours.  ProBNP (last 3 results) No results for input(s): PROBNP in the last 8760 hours.  CBG: No results for input(s): GLUCAP in the last 168 hours.  Radiological Exams on Admission: Ct Angio Chest Pe W/cm &/or Wo Cm  06/08/2014   CLINICAL DATA:  Hemoptysis.  EXAM: CT ANGIOGRAPHY CHEST WITH CONTRAST  TECHNIQUE: Multidetector CT imaging of the chest was performed  using the standard protocol during bolus administration of intravenous contrast. Multiplanar CT image reconstructions and MIPs were obtained to evaluate the vascular anatomy.  CONTRAST:  7mL OMNIPAQUE IOHEXOL 350 MG/ML SOLN  COMPARISON:  01/22/2014  FINDINGS: There are multiple small pulmonary emboli, involving the right upper lobe, right middle lobe, right lower lobe, left lower lobe and left upper lobe. No central pulmonary emboli are evident. There is no evidence of right heart strain.  The right lower lobe mass is now obscured by a larger area of basilar  consolidation. There is a small right pleural effusion, new. The left lung is clear except for minor basilar atelectatic linear opacities. The large hiatal hernia is again evident. No significant skeletal lesions are evident.  Review of the MIP images confirms the above findings.  IMPRESSION: 1. Multiple small pulmonary emboli involving both lungs. No large central emboli. No evidence of right heart strain. 2. Extensive new consolidation in the right lower lobe posteriorly, partially obscuring the known right lower lobe mass. 3. Small new right pleural effusion. 4. Large hiatal hernia   Electronically Signed   By: Andreas Newport M.D.   On: 06/08/2014 01:03   Dg Chest Port 1 View  06/07/2014   CLINICAL DATA:  Initial evaluation hemoptysis, history of lung cancer and COPD  EXAM: PORTABLE CHEST - 1 VIEW  COMPARISON:  05/25/2014  FINDINGS: Mild to moderate cardiac enlargement stable. Vascular pattern normal. Calcification of the aortic arch unchanged. Left lung shows no significant opacities other than mild atelectasis the lower lobe.  There is infiltrate throughout the right mid to lower lung zone. This is new from the prior study. Moderate hiatal hernia noted again.  IMPRESSION: Fairly extensive infiltrate right mid to lower lung zone representing a change from prior studies. Pulmonary hemorrhage or pneumonia could both causes appearance.   Electronically Signed   By: Skipper Cliche M.D.   On: 06/07/2014 21:58     Assessment/Plan Active Problems:   Chronic obstructive pulmonary disease   Hypothyroidism   Hemoptysis   Lung mass   Pulmonary embolism   Hematemesis   1. Hemoptysis with lung mass and consolidation. 2. Pulmonary embolism. 3. Chronic anemia. 4. COPD presently not wheezing. 5. Chronic kidney disease stage III. 6. Hypothyroidism. 7. Hyperlipidemia. 8. Recent right hip fracture.  Plan - at this time patient does not want any active intervention. Since patient has worsening  hemoptysis patient will not be a candidate for anticoagulation given patient's pulmonary embolism. Patient at this time as requested just continue with patient's present medication and no further tests including blood tests and wants comfort measures only. Hospice has been consulted.   DVT Prophylaxis SCDs.  Code Status: DO NOT RESUSCITATE.  Family Communication: None.  Disposition Plan: Admit to inpatient.    [name] N. [facility] Pager [phone].  If 7PM-7AM, please contact night-coverage [email or web address] Password [email or web address] 06/08/2014, 5:08 AM

## 2014-06-08 NOTE — ED Notes (Signed)
Diet Tray ordered for patient.

## 2014-06-08 NOTE — ED Notes (Signed)
Called [facility] at pt request to give an update.

## 2014-06-08 NOTE — Discharge Summary (Addendum)
Physician Discharge Summary  Trivia Heffelfinger ERX:[id number] DOB: Jul 30, 1919 DOA: 06/07/2014  PCP: Kandice Hams, MD  Admit date: 06/07/2014 Discharge date: 06/08/2014  Time spent: 45 minutes  Recommendations for Outpatient Follow-up:  1. Hospice to follow closely for end of life care  Discharge Condition: gaurded Diet recommendation: as tolerated  Discharge Diagnoses:  Principal Problem:   Hemoptysis Active Problems:   Chronic obstructive pulmonary disease   Hypothyroidism   Lung mass   Pulmonary embolism    History of present illness:  [name] is a 81 y.o. female with history of COPD, hypothyroidism, hyperlipidemia, lung mass which patient does not want to be treated was recently admitted for right hip fracture and had undergone ORIF was brought to the ER from rehabilitation after patient was having worsening hemoptysis. Patient has been having hemoptysis off and on for last 3-4 months with a lung mass which patient does not want to be treated. But over the last 24-48 hours patient's hemoptysis worsen. Patient was on aspirin for DVT prophylaxis which was discontinued 24 hours ago as per the patient. Due to persistent hemoptysis more than usual patient was brought to the ER. In the ER patient underwent CT angiogram of the chest which showed pulmonary embolism and also mass with consolidation. Pulmonary critical care was consulted by ER physician and at this time pulmonary critical care states that the patient is not a candidate for anticoagulation secondary to active hemoptysis and there is no need for antibiotics.    Hospital Course:  Hemoptysis/ lung mass/ new find of small PEs on CT - likely due to growing lung mass which is likely lung cancer. There is mention of consolidation on the CT which I suspect may be post obstructive pneumonia. The paitient has not wanted treatment for the lung mass and this is a reasonable decision. She did want to talk to Dr Halford Chessman today and  therefore, I called him. He spoke with her on speaker phone and re-iterated that the hemoptysis was related to the growing mass. He reviewed the CT from today.  She asked him for how much longer she has to live and he explained that it is difficult to tell, maybe a some months if no other acute issues arise. He recommended hospice cont to follow her at the [place] home. There is no plan to do any treatment in the hospital . I see that Aspirin remains on her med list and I have discontinues this in light of her increasing hemoptysis. It is likel she has a DVT and is a high risk of getting more PEs and dying from this. She cannot be anticoagulated as hemorrhaging from lung mass will occur.   She will be discharged back to SNF today. Hospice liaisons are here and talking to her a well.    Procedures:  none  Consultations:  Phone consult with Dr Soon  Discharge Exam: Filed Weights   06/07/14 2111  Weight: 69.4 kg (153 lb)   Filed Vitals:   06/08/14 1530  BP: 138/53  Pulse: 63  Temp:   Resp: 19    General: AAO x 3, no distress Cardiovascular: RRR, no murmurs  Respiratory: clear to auscultation bilaterally GI: soft, non-tender, non-distended, bowel sound positive  Discharge Instructions You were cared for by a hospitalist during your hospital stay. If you have any questions about your discharge medications or the care you received while you were in the hospital after you are discharged, you can call the unit and asked to speak with  the hospitalist on call if the hospitalist that took care of you is not available. Once you are discharged, your primary care physician will handle any further medical issues. Please note that NO REFILLS for any discharge medications will be authorized once you are discharged, as it is imperative that you return to your primary care physician (or establish a relationship with a primary care physician if you do not have one) for your aftercare needs so that they  can reassess your need for medications and monitor your lab values.      Discharge Instructions    Discharge instructions    Complete by:  As directed   Regular diet     Increase activity slowly    Complete by:  As directed             Medication List    STOP taking these medications        aspirin EC 325 MG tablet      TAKE these medications        acetaminophen 325 MG tablet  Commonly known as:  TYLENOL  Take 650 mg by mouth every 6 (six) hours as needed for mild pain.     acyclovir ointment 5 %  Commonly known as:  ZOVIRAX  Apply 1 application topically every 3 (three) hours as needed. For cold sores     albuterol 108 (90 BASE) MCG/ACT inhaler  Commonly known as:  PROVENTIL HFA;VENTOLIN HFA  Inhale 2 puffs into the lungs every 6 (six) hours as needed for wheezing or shortness of breath.     albuterol (2.5 MG/3ML) 0.083% nebulizer solution  Commonly known as:  PROVENTIL  Take 3 mLs (2.5 mg total) by nebulization every 2 (two) hours as needed for wheezing.     amLODipine 10 MG tablet  Commonly known as:  NORVASC  Take 0.5 tablets (5 mg total) by mouth daily.     amoxicillin 500 MG capsule  Commonly known as:  AMOXIL  Take 2,000 mg by mouth once. Prior to dental procedure     augmented betamethasone dipropionate 0.05 % ointment  Commonly known as:  DIPROLENE-AF  Apply 1 application topically 2 (two) times daily as needed (for itching).     benzonatate 200 MG capsule  Commonly known as:  TESSALON  Take 1 capsule (200 mg total) by mouth 3 (three) times daily as needed for cough.     BIOTENE MOISTURIZING MOUTH Soln  Use as directed 2 sprays in the mouth or throat every 6 (six) hours as needed (for dry mouth).     CERAVE Crea  Apply 1 application topically daily. Applies to dry skin.     cetirizine 10 MG tablet  Commonly known as:  ZYRTEC  Take 10 mg by mouth daily.     chlorhexidine 0.12 % solution  Commonly known as:  PERIDEX  Use as directed 15 mLs in  the mouth or throat. swish 15 mls for 30 sec then spit, twice daily     chlorpheniramine-HYDROcodone 10-8 MG/5ML Lqcr  Commonly known as:  TUSSIONEX  Take 5 mLs by mouth every 12 (twelve) hours as needed for cough.     coal tar 0.5 % shampoo  Commonly known as:  NEUTROGENA T-GEL  Apply 1 application topically See admin instructions. She uses on Tuesday and Friday.     diphenhydrAMINE-zinc acetate cream  Commonly known as:  BENADRYL  Apply topically 3 (three) times daily as needed for itching.     donepezil 10 MG  tablet  Commonly known as:  ARICEPT  Take 10 mg by mouth at bedtime.     escitalopram 20 MG tablet  Commonly known as:  LEXAPRO  Take 20 mg by mouth every morning.     feeding supplement (RESOURCE BREEZE) Liqd  Take 1 Container by mouth 3 (three) times daily between meals.     ferrous sulfate 325 (65 FE) MG tablet  Take 1 tablet (325 mg total) by mouth 3 (three) times daily after meals.     fluticasone 50 MCG/ACT nasal spray  Commonly known as:  FLONASE  Place 2 sprays into both nostrils daily.     gabapentin 100 MG capsule  Commonly known as:  NEURONTIN  Take 200 mg by mouth at bedtime.     HYDROcodone-acetaminophen 5-325 MG per tablet  Commonly known as:  NORCO  Take 1-2 tablets by mouth every 4 (four) hours as needed for moderate pain.     hydrOXYzine 25 MG tablet  Commonly known as:  ATARAX/VISTARIL  Take 25 mg by mouth at bedtime.     levothyroxine 88 MCG tablet  Commonly known as:  SYNTHROID, LEVOTHROID  Take 88 mcg by mouth daily before breakfast.     lidocaine 5 %  Commonly known as:  LIDODERM  Place 1 patch onto the skin daily. Remove & Discard patch within 12 hours or as directed by MD     LORazepam 2 MG/ML concentrated solution  Commonly known as:  ATIVAN  Take 0.5 mLs (1 mg total) by mouth every 6 (six) hours as needed for anxiety or sedation.     magic mouthwash w/lidocaine Soln  Take 5 mLs by mouth 4 (four) times daily as needed for mouth  pain.     morphine CONCENTRATE 10 mg / 0.5 ml concentrated solution  Take 0.25 mLs (5 mg total) by mouth every 4 (four) hours as needed for severe pain, anxiety or shortness of breath.     nystatin 100000 UNIT/GM Powd  Apply topically 2 (two) times daily.     olopatadine 0.1 % ophthalmic solution  Commonly known as:  PATANOL  Place 1 drop into both eyes 2 (two) times daily as needed. For dry eyes     polyethylene glycol packet  Commonly known as:  MIRALAX / GLYCOLAX  Take 17 g by mouth every morning.     pramoxine-mineral oil-zinc 1-12.5 % rectal ointment  Commonly known as:  TUCKS  Place 1 application rectally every 2 (two) hours as needed for itching (itching).     pravastatin 40 MG tablet  Commonly known as:  PRAVACHOL  Take 1 tablet (40 mg total) by mouth every evening.     PRESERVISION AREDS Caps  Take 1 capsule by mouth 2 (two) times daily with a meal.     SENOKOT S 8.6-50 MG per tablet  Generic drug:  senna-docusate  Take 1-2 tablets by mouth See admin instructions. She takes one tablet in the morning and two tablets at bedtime.     SYSTANE BALANCE 0.6 % Soln  Generic drug:  Propylene Glycol  Place 1 drop into both eyes daily as needed (For dry or itchy eyes.). One drop both eye as needed       Allergies  Allergen Reactions  . Cortisone Other (See Comments)    Rash   . Minocin [Minocycline Hcl] Other (See Comments)    Unknown  . Minocycline Hcl     unknown  . Neomycin-Bacitracin Zn-Polymyx     unknown  .  Neosporin [Neomycin-Bacitracin Zn-Polymyx] Other (See Comments)    Unknown  . Other Other (See Comments)    Steroidal Neuromuscular Blockers  . Tetanus Toxoids Other (See Comments)    Unknown      The results of significant diagnostics from this hospitalization (including imaging, microbiology, ancillary and laboratory) are listed below for reference.    Significant Diagnostic Studies: Ct Angio Chest Pe W/cm &/or Wo Cm  06/08/2014   CLINICAL DATA:   Hemoptysis.  EXAM: CT ANGIOGRAPHY CHEST WITH CONTRAST  TECHNIQUE: Multidetector CT imaging of the chest was performed using the standard protocol during bolus administration of intravenous contrast. Multiplanar CT image reconstructions and MIPs were obtained to evaluate the vascular anatomy.  CONTRAST:  70mL OMNIPAQUE IOHEXOL 350 MG/ML SOLN  COMPARISON:  01/22/2014  FINDINGS: There are multiple small pulmonary emboli, involving the right upper lobe, right middle lobe, right lower lobe, left lower lobe and left upper lobe. No central pulmonary emboli are evident. There is no evidence of right heart strain.  The right lower lobe mass is now obscured by a larger area of basilar consolidation. There is a small right pleural effusion, new. The left lung is clear except for minor basilar atelectatic linear opacities. The large hiatal hernia is again evident. No significant skeletal lesions are evident.  Review of the MIP images confirms the above findings.  IMPRESSION: 1. Multiple small pulmonary emboli involving both lungs. No large central emboli. No evidence of right heart strain. 2. Extensive new consolidation in the right lower lobe posteriorly, partially obscuring the known right lower lobe mass. 3. Small new right pleural effusion. 4. Large hiatal hernia   Electronically Signed   By: Andreas Newport M.D.   On: 06/08/2014 01:03   Pelvis Portable  05/26/2014   CLINICAL DATA:  Post RIGHT hip hemiarthroplasty  EXAM: PORTABLE PELVIS 1-2 VIEWS  COMPARISON:  Portable exam 1912 hours compared to preoperative images of 05/25/2014  FINDINGS: Interval resection of RIGHT femoral head/neck and placement of a femoral prosthesis.  Bones demineralized.  No acute fracture dislocation.  IMPRESSION: RIGHT hip prosthesis without acute complication.   Electronically Signed   By: Lavonia Dana M.D.   On: 05/26/2014 19:31   Dg Chest Port 1 View  06/07/2014   CLINICAL DATA:  Initial evaluation hemoptysis, history of lung cancer and  COPD  EXAM: PORTABLE CHEST - 1 VIEW  COMPARISON:  05/25/2014  FINDINGS: Mild to moderate cardiac enlargement stable. Vascular pattern normal. Calcification of the aortic arch unchanged. Left lung shows no significant opacities other than mild atelectasis the lower lobe.  There is infiltrate throughout the right mid to lower lung zone. This is new from the prior study. Moderate hiatal hernia noted again.  IMPRESSION: Fairly extensive infiltrate right mid to lower lung zone representing a change from prior studies. Pulmonary hemorrhage or pneumonia could both causes appearance.   Electronically Signed   By: Skipper Cliche M.D.   On: 06/07/2014 21:58   Dg Chest Portable 1 View  05/25/2014   CLINICAL DATA:  Hip fracture.  Hip injury.  EXAM: PORTABLE CHEST - 1 VIEW  COMPARISON:  01/28/2014.  FINDINGS: Cardiopericardial silhouette upper limits of normal for projection. Aortic arch atherosclerosis. Fluid level is present in the inferior LEFT chest, compatible with moderate hiatal hernia and gastric fluid level. RIGHT basilar perihilar airspace opacity which may represent edema, aspiration, or pneumonia. This extends to the RIGHT costophrenic angle.  Monitoring leads project over the chest. No focal consolidation in the LEFT lung.  IMPRESSION: 1. RIGHT infrahilar and perihilar airspace disease may represent asymmetric pulmonary edema, aspiration or pneumonia. 2. Moderate hiatal hernia with fluid level in the LEFT chest.   Electronically Signed   By: Dereck Ligas M.D.   On: 05/25/2014 20:04   Dg Hip Unilat With Pelvis 2-3 Views Right  05/25/2014   CLINICAL DATA:  One week history of right hip pain without known trauma  EXAM: RIGHT HIP (WITH PELVIS) 2-3 VIEWS  COMPARISON:  Pelvis and left hip of March 29, 2014  FINDINGS: There is a mildly impacted fracture of the neck of the right femoral head. The femoral head remains appropriately related to the acetabulum. The intertrochanteric region of the right femur is intact.  The bony pelvis is osteopenic. There is no lytic or blastic lesion nor acute fracture. There are degenerative changes of the lower lumbar spine. The observed portions of the sacrum are unremarkable. The left hip is unremarkable.  IMPRESSION: 1. There is an acute impacted slightly angulated subcapital fracture of the right hip. 2. There is no acute fracture of the bony pelvis. 3. These results will be called to the ordering clinician or representative by the Radiologist Assistant, and communication documented in the PACS or zVision Dashboard.   Electronically Signed   By: David  Martinique   On: 05/25/2014 16:21    Microbiology: No results found for this or any previous visit (from the past 240 hour(s)).   Labs: Basic Metabolic Panel:  Recent Labs Lab 06/07/14 2153  NA 138  K 4.0  CL 106  CO2 24  GLUCOSE 93  BUN 10  CREATININE 1.14*  CALCIUM 8.8   Liver Function Tests:  Recent Labs Lab 06/07/14 2153  AST 17  ALT 8  ALKPHOS 83  BILITOT 0.5  PROT 6.5  ALBUMIN 2.5*   No results for input(s): LIPASE, AMYLASE in the last 168 hours. No results for input(s): AMMONIA in the last 168 hours. CBC:  Recent Labs Lab 06/07/14 2153  WBC 12.9*  NEUTROABS 8.6*  HGB 9.6*  HCT 32.0*  MCV 87.2  PLT 442*   Cardiac Enzymes: No results for input(s): CKTOTAL, CKMB, CKMBINDEX, TROPONINI in the last 168 hours. BNP: BNP (last 3 results) No results for input(s): BNP in the last 8760 hours.  ProBNP (last 3 results) No results for input(s): PROBNP in the last 8760 hours.  CBG: No results for input(s): GLUCAP in the last 168 hours.     SignedDebbe Odea, MD [facility] 06/08/2014, 3:44 PM

## 2014-06-08 NOTE — ED Notes (Signed)
Call pt's daughter Serita Grit at home: [phone], cell: [phone] or Saralyn Pilar at Cell [phone] in case of emergency or updates.

## 2014-06-08 NOTE — ED Notes (Signed)
Shown Dr. Dina Rich blood clot that Patient coughed up.

## 2014-06-08 NOTE — ED Notes (Signed)
Patient given soup.

## 2014-06-15 ENCOUNTER — Encounter: Payer: Self-pay | Admitting: Adult Health

## 2014-06-15 ENCOUNTER — Non-Acute Institutional Stay (SKILLED_NURSING_FACILITY): Payer: Medicare Other | Admitting: Adult Health

## 2014-06-15 DIAGNOSIS — I482 Chronic atrial fibrillation, unspecified: Secondary | ICD-10-CM

## 2014-06-15 DIAGNOSIS — R918 Other nonspecific abnormal finding of lung field: Secondary | ICD-10-CM | POA: Diagnosis not present

## 2014-06-15 DIAGNOSIS — M6289 Other specified disorders of muscle: Secondary | ICD-10-CM

## 2014-06-15 DIAGNOSIS — R531 Weakness: Secondary | ICD-10-CM

## 2014-06-15 DIAGNOSIS — R042 Hemoptysis: Secondary | ICD-10-CM | POA: Diagnosis not present

## 2014-06-15 NOTE — Progress Notes (Signed)
Patient ID: [name], female   DOB: [birth year], 81 y.o.   MRN: [id number]   Location:  [facility]   Code Status: DNR, hospice care     Chief Complaint  Patient presents with  . Acute Visit    right sided weakness     Allergies  Allergen Reactions  . Cortisone Other (See Comments)    Rash   . Minocin [Minocycline Hcl] Other (See Comments)    Unknown  . Minocycline Hcl     unknown  . Neomycin-Bacitracin Zn-Polymyx     unknown  . Neosporin [Neomycin-Bacitracin Zn-Polymyx] Other (See Comments)    Unknown  . Other Other (See Comments)    Steroidal Neuromuscular Blockers  . Tetanus Toxoids Other (See Comments)    Unknown    Chief Complaint  Patient presents with  . Acute Visit    right sided weakness    HPI: 81 y.o. female with h/o severe aortic stenosis, paroxysmal afib (off coumadin since 8/15 due to hemoptysis)), COPD, severe protein calorie malnutrition, and hypothyroidism was initially admitted to rehab 05/25/14 after decline in status.  She had been living in independent living.  Review of her records shows that she had developed pneumonia 01/25/14 which was felt to be postobstructive due to a RLL mass.  Upon review with pulmonary (Dr. Halford Chessman), it was decided not to biopsy the lesion due to no plans for surgery, chemo or xrt for the mass.  The pneumonia was treated initially with vanc and zosyn and then she was placed on levaquin and returned to [place] on hospice care via [facility].   She was sent out to the hospital after a right hip fracture on 05/25/14 and had a right hemiarthroplasty and was discharged back to rehab on 05/28/14 with PT and OT.   She was sent to the ER due to increased hemoptysis on 06/07/14 and was sent back with orders to D/C ASA. A CT of her chest was obtained which revealed multiple small emboli on both sides but no right heart strain or large embolus.   I was asked to see her today due to right sided weakness with some slurred speech noted this  morning. She has had more difficulty ambulating and can not lift her right leg off the bed. She reports that it is more difficult work with PT and that she is weaker. She has DOE and breaks out in sweats at times.   ROS: Review of Systems  Constitutional: Positive for malaise/fatigue and diaphoresis. Negative for fever and chills.  HENT: Negative for congestion.   Eyes: Negative for blurred vision and double vision.       Decreased vision with macular degeneration  Respiratory: Positive for cough, hemoptysis, sputum production, shortness of breath and wheezing.   Cardiovascular: Positive for leg swelling. Negative for chest pain.  Gastrointestinal: Positive for constipation. Negative for heartburn, abdominal pain and blood in stool.  Genitourinary: Negative for dysuria.       Incontinent  Musculoskeletal: Positive for joint pain.  Skin: Positive for itching.  Neurological: Positive for speech change, focal weakness and weakness. Negative for seizures and loss of consciousness.     Past Medical History  Diagnosis Date  . Cholelithiases   . Hypothyroid   . Leg paresthesia   . Aortic valve stenosis   . Arthritis   . Hyperlipidemia   . Dementia   . Carotid artery occlusion   . Renal disorder   . Hypertension   . COPD (chronic obstructive  pulmonary disease)   . SEIZURE DISORDER 11/05/2006  . GERD 11/05/2006  . CVA 02/07/2007  . Macular degeneration of both eyes   . Blind in both eyes   . Peripheral neuropathy   . Pneumonia     "many times"  . Internal hemorrhoid, bleeding   . Malaria     "as a child"  . Skin cancer     "nose, face mostly"  . Chronic lower back pain     "when I go to the bathroom"  . Intestinal infection due to Clostridium difficile 09/2011  . Benign neoplasm of breast   . Gout, unspecified   . Anemia   . Anxiety   . Other dysfunctions of sleep stages or arousal from sleep   . Depression   . Chronic rhinitis   . Abscess of lung(513.0)     RUL  .  Diaphragmatic hernia without mention of obstruction or gangrene   . Diverticulosis of colon (without mention of hemorrhage)   . Acute duodenal ulcer with perforation and obstruction 08/2011    perforation  . Hemorrhage of rectum and anus   . Abscess of intestine 09/2011    drainage  . Other psoriasis   . Lumbago   . Nonspecific elevation of levels of transaminase or lactic acid dehydrogenase (LDH)   . Solitary pulmonary nodule     RLL nodule 1 cm  . Transient ischemic attack (TIA), and cerebral infarction without residual deficits(V12.54)   . Long term (current) use of anticoagulants   . Sacroiliitis 08/27/2012  . Anemia, iron deficiency 10/16/2011  . Congestive heart failure   . Spondylosis of lumbosacral region 09/24/2012    L/S xray 08/2012: Mild to moderate osteoarthritis and degenerative spondylosis he diffusely. Anterior subluxation of L4 relative to L5 at 5.7 cm. Mild to moderate levoconvex curvature the mid lumbar spine    . Dysrhythmia     hx atrial fib  . Arteritis     hx giant cell arteritis  . Aortic stenosis, severe 03/02/2013    Followed since 2004 with mild aortic valve stenosis and mild regurgitation. Echocardiograms 2006-2013 show  progression from mild to moderate aortic stenosis, mild insufficiency. Mild to moderate mitral valve regurgitation evident in 2013.   07/25/11: Worsening valve disease by echocardiogram, pt is not interested in invasive treatment. Symptoms appear stable for now. Continue to monitor      Past Surgical History  Procedure Laterality Date  . Vesicovaginal fistula closure w/ tah    . Tonsillectomy    . Breast lumpectomy      bilaterally  . Cataract extraction w/ intraocular lens  implant, bilateral    . Skin cancer excision      "nose and face"  . Abdominal hysterectomy    . Hemorrhoid surgery  11/2008    banding  . Hip surgery Right 05/26/2014  . Hip arthroplasty Right 05/26/2014    Procedure: ARTHROPLASTY BIPOLAR HIP;  Surgeon: Mauri Pole, MD;   Location: [place];  Service: Orthopedics;  Laterality: Right;   Social History:   reports that she quit smoking about 20 years ago. Her smoking use included Cigarettes. She has a 50 pack-year smoking history. She has never used smokeless tobacco. She reports that she drinks alcohol. She reports that she does not use illicit drugs.  Family History  Problem Relation Age of Onset  . Hyperlipidemia    . Hypertension    . Stroke    . Colon cancer Daughter   . Heart attack  Father   . Kidney failure Father   . Heart disease Son     Medications: Patient's Medications  New Prescriptions   No medications on file  Previous Medications   ACETAMINOPHEN (TYLENOL) 325 MG TABLET    Take 650 mg by mouth every 6 (six) hours as needed for mild pain.    ACYCLOVIR OINTMENT (ZOVIRAX) 5 %    Apply 1 application topically every 3 (three) hours as needed. For cold sores   ALBUTEROL (PROVENTIL HFA;VENTOLIN HFA) 108 (90 BASE) MCG/ACT INHALER    Inhale 2 puffs into the lungs every 6 (six) hours as needed for wheezing or shortness of breath.   ALBUTEROL (PROVENTIL) (2.5 MG/3ML) 0.083% NEBULIZER SOLUTION    Take 3 mLs (2.5 mg total) by nebulization every 2 (two) hours as needed for wheezing.   ALUM & MAG HYDROXIDE-SIMETH (MAGIC MOUTHWASH W/LIDOCAINE) SOLN    Take 5 mLs by mouth 4 (four) times daily as needed for mouth pain.   AMOXICILLIN (AMOXIL) 500 MG CAPSULE    Take 2,000 mg by mouth once. Prior to dental procedure   ARTIFICIAL SALIVA (BIOTENE MOISTURIZING MOUTH) SOLN    Use as directed 2 sprays in the mouth or throat every 6 (six) hours as needed (for dry mouth).   AUGMENTED BETAMETHASONE DIPROPIONATE (DIPROLENE-AF) 0.05 % OINTMENT    Apply 1 application topically 2 (two) times daily as needed (for itching).    BENZONATATE (TESSALON) 200 MG CAPSULE    Take 1 capsule (200 mg total) by mouth 3 (three) times daily as needed for cough.   CETIRIZINE (ZYRTEC) 10 MG TABLET    Take 10 mg by mouth daily.   CHLORHEXIDINE  (PERIDEX) 0.12 % SOLUTION    Use as directed 15 mLs in the mouth or throat. swish 15 mls for 30 sec then spit, twice daily   CHLORPHENIRAMINE-HYDROCODONE (TUSSIONEX) 10-8 MG/5ML LQCR    Take 5 mLs by mouth every 12 (twelve) hours as needed for cough.   COAL TAR (NEUTROGENA T-GEL) 0.5 % SHAMPOO    Apply 1 application topically See admin instructions. She uses on Tuesday and Friday.   DIPHENHYDRAMINE-ZINC ACETATE (BENADRYL) CREAM    Apply topically 3 (three) times daily as needed for itching.   DONEPEZIL (ARICEPT) 10 MG TABLET    Take 10 mg by mouth at bedtime.    EMOLLIENT (CERAVE) CREA    Apply 1 application topically daily. Applies to dry skin.   ESCITALOPRAM (LEXAPRO) 20 MG TABLET    Take 20 mg by mouth every morning.    FEEDING SUPPLEMENT, RESOURCE BREEZE, (RESOURCE BREEZE) LIQD    Take 1 Container by mouth 3 (three) times daily between meals.   FERROUS SULFATE 325 (65 FE) MG TABLET    Take 1 tablet (325 mg total) by mouth 3 (three) times daily after meals.   FLUTICASONE (FLONASE) 50 MCG/ACT NASAL SPRAY    Place 2 sprays into both nostrils daily.   GABAPENTIN (NEURONTIN) 100 MG CAPSULE    Take 200 mg by mouth at bedtime.    HYDROCODONE-ACETAMINOPHEN (NORCO) 5-325 MG PER TABLET    Take 1-2 tablets by mouth every 4 (four) hours as needed for moderate pain.   HYDROXYZINE (ATARAX/VISTARIL) 25 MG TABLET    Take 25 mg by mouth at bedtime.   LEVOTHYROXINE (SYNTHROID, LEVOTHROID) 88 MCG TABLET    Take 88 mcg by mouth daily before breakfast.    LIDOCAINE (LIDODERM) 5 %    Place 1 patch onto the skin daily. Remove &  Discard patch within 12 hours or as directed by MD   LORAZEPAM (ATIVAN) 2 MG/ML CONCENTRATED SOLUTION    Take 0.5 mLs (1 mg total) by mouth every 6 (six) hours as needed for anxiety or sedation.   MORPHINE SULFATE (MORPHINE CONCENTRATE) 10 MG / 0.5 ML CONCENTRATED SOLUTION    Take 0.25 mLs (5 mg total) by mouth every 4 (four) hours as needed for severe pain, anxiety or shortness of breath.    MULTIPLE VITAMINS-MINERALS (PRESERVISION AREDS) CAPS    Take 1 capsule by mouth 2 (two) times daily with a meal.    NYSTATIN (MYCOSTATIN/NYSTOP) 100000 UNIT/GM POWD    Apply topically 2 (two) times daily.   OLOPATADINE (PATANOL) 0.1 % OPHTHALMIC SOLUTION    Place 1 drop into both eyes 2 (two) times daily as needed. For dry eyes   POLYETHYLENE GLYCOL (MIRALAX / GLYCOLAX) PACKET    Take 17 g by mouth every morning.    PRAMOXINE-MINERAL OIL-ZINC (TUCKS) 1-12.5 % RECTAL OINTMENT    Place 1 application rectally every 2 (two) hours as needed for itching (itching).   PRAVASTATIN (PRAVACHOL) 40 MG TABLET    Take 1 tablet (40 mg total) by mouth every evening.   PROPYLENE GLYCOL (SYSTANE BALANCE) 0.6 % SOLN    Place 1 drop into both eyes daily as needed (For dry or itchy eyes.). One drop both eye as needed   SENNA-DOCUSATE (SENOKOT S) 8.6-50 MG PER TABLET    Take 1-2 tablets by mouth See admin instructions. She takes one tablet in the morning and two tablets at bedtime.  Modified Medications   No medications on file  Discontinued Medications   AMLODIPINE (NORVASC) 10 MG TABLET    Take 0.5 tablets (5 mg total) by mouth daily.     Physical Exam: Filed Vitals:   06/15/14 1517  BP: 132/56  Pulse: 68  Temp: 96.9 F (36.1 C)  Resp: 18  SpO2: 94%   Physical Exam  Constitutional: She is oriented to person, place, and time. No distress.  HENT:  Head: Normocephalic and atraumatic.  Eyes: Conjunctivae and EOM are normal. Pupils are equal, round, and reactive to light.  Cardiovascular: Normal rate, regular rhythm and intact distal pulses.   Murmur heard. +1 edema to RLE  Pulmonary/Chest: She has wheezes.  Increased effort; diminished breath sounds throughout with wheezing on the right  Abdominal: Soft. Bowel sounds are normal. She exhibits no distension. There is no tenderness.  Neurological: She is alert and oriented to person, place, and time.  No facial cranial nerve deficit noted but significant  right sided weakness noted to RUE and RLE. Slightly muffled speech  Skin: She is not diaphoretic.  Right groin area with small mass with surrounding erythema.  Small mass noted to RLQ as well with no tenderness  Psychiatric: She has a normal mood and affect.     Labs reviewed: Basic Metabolic Panel:  Recent Labs  11/04/13 2030  05/26/14 0900 05/27/14 0525 05/28/14 0602 06/07/14 2153  NA  --   < > 139 136 136 138  K  --   < > 3.0* 3.5 4.3 4.0  CL  --   < > 103 101 103 106  CO2  --   < > 32 27 25 24   GLUCOSE  --   < > 105* 91 107* 93  BUN  --   < > 12 11 9 10   CREATININE  --   < > 1.25* 1.07 1.17* 1.14*  CALCIUM  --   < >  8.6 8.3* 8.3* 8.8  MG 1.8  --  2.1  --   --   --   < > = values in this interval not displayed. Liver Function Tests:  Recent Labs  11/04/13 1548 06/07/14 2153  AST 25 17  ALT 6 8  ALKPHOS 89 83  BILITOT 0.3 0.5  PROT 7.5 6.5  ALBUMIN 3.2* 2.5*   No results for input(s): LIPASE, AMYLASE in the last 8760 hours. No results for input(s): AMMONIA in the last 8760 hours. CBC:  Recent Labs  11/04/13 1548  01/25/14 1502  05/27/14 0525 05/28/14 0602 06/07/14 2153  WBC 6.7  < > 7.7  < > 8.4 9.1 12.9*  NEUTROABS 3.6  --  5.2  --   --   --  8.6*  HGB 8.6*  < > 11.6*  < > 8.6* 8.2* 9.6*  HCT 29.6*  < > 37.0  < > 28.2* 26.9* 32.0*  MCV 74.2*  < > 89.4  < > 80.6 80.8 87.2  PLT 343  < > 258  < > 304 296 442*  < > = values in this interval not displayed. Cardiac Enzymes: No results for input(s): CKTOTAL, CKMB, CKMBINDEX, TROPONINI in the last 8760 hours. BNP: Invalid input(s): POCBNP CBG: No results for input(s): GLUCAP in the last 8760 hours.  Imaging and Procedures: 05/25/14 Right hip and pelvis:  1. There is an acute impacted slightly angulated subcapital fracture of the right hip. 2. There is no acute fracture of the bony pelvis. 3. These results will be called to the ordering clinician or representative by the Radiologist Assistant, and  communication documented in the PACS or zVision Dashboard.  05/25/14:  CXR portable 1. RIGHT infrahilar and perihilar airspace disease may represent asymmetric pulmonary edema, aspiration or pneumonia. 2. Moderate hiatal hernia with fluid level in the LEFT chest.  05/26/14:  Portable pelvis xray:   RIGHT hip prosthesis without acute complication.  06/08/14 CT of the chest IMPRESSION: 1. Multiple small pulmonary emboli involving both lungs. No large central emboli. No evidence of right heart strain. 2. Extensive new consolidation in the right lower lobe posteriorly, partially obscuring the known right lower lobe mass. 3. Small new right pleural effusion. 4. Large hiatal hernia  Assessment/Plan  1. Right sided weakness Most likely secondary to a CVA. She is at risk since she is not on coumadin. She is not able to tolerate this due to hemoptysis, frailty, and overall goals of care. We agreed to maintain comfort measures only.  She can continue with PT if she would like or discontinue.  She is on a statin and we can continue this for now but I will discuss discontinuing meds at a later time. If she shows signs of aspiration will involve speech therapy  2. Hemoptysis Continue to have this, no change. H/H stable continue ferrous sulfate  3. Chronic atrial fibrillation Rate controlled without meds. No anticoagulation due to the above. Continue to monitor.   4. Lung mass Followed by Dr. Halford Chessman. Goals of care are comfort. Continue oxygen. Continue prn albuterol. Spiriva discontinued per her request previously.   Cindi Carbon, ANP [facility] [phone]

## 2014-06-16 ENCOUNTER — Inpatient Hospital Stay: Payer: Medicare Other | Admitting: Adult Health

## 2014-06-17 ENCOUNTER — Non-Acute Institutional Stay (SKILLED_NURSING_FACILITY): Payer: Medicare Other | Admitting: Adult Health

## 2014-06-17 DIAGNOSIS — R569 Unspecified convulsions: Secondary | ICD-10-CM | POA: Diagnosis not present

## 2014-06-17 DIAGNOSIS — I482 Chronic atrial fibrillation, unspecified: Secondary | ICD-10-CM

## 2014-06-17 DIAGNOSIS — R531 Weakness: Secondary | ICD-10-CM

## 2014-06-17 DIAGNOSIS — R042 Hemoptysis: Secondary | ICD-10-CM

## 2014-06-17 DIAGNOSIS — M6289 Other specified disorders of muscle: Secondary | ICD-10-CM

## 2014-06-17 DIAGNOSIS — R918 Other nonspecific abnormal finding of lung field: Secondary | ICD-10-CM | POA: Diagnosis not present

## 2014-06-17 NOTE — Progress Notes (Signed)
Patient ID: [name], female   DOB: 17-Mar-1920, 81 y.o.   MRN: [id number]   Location:  [facility]   Code Status: DNR, hospice care     Chief Complaint  Patient presents with  . Acute Visit    seizures     Allergies  Allergen Reactions  . Cortisone Other (See Comments)    Rash   . Minocin [Minocycline Hcl] Other (See Comments)    Unknown  . Minocycline Hcl     unknown  . Neomycin-Bacitracin Zn-Polymyx     unknown  . Neosporin [Neomycin-Bacitracin Zn-Polymyx] Other (See Comments)    Unknown  . Other Other (See Comments)    Steroidal Neuromuscular Blockers  . Tetanus Toxoids Other (See Comments)    Unknown    Chief Complaint  Patient presents with  . Acute Visit    seizures    HPI: 81 y.o. female with h/o severe aortic stenosis, paroxysmal afib (off coumadin since 8/15 due to hemoptysis)), COPD, severe protein calorie malnutrition, and hypothyroidism was initially admitted to rehab 05/25/14 after decline in status.  She had been living in independent living.  Review of her records shows that she had developed pneumonia 01/25/14 which was felt to be postobstructive due to a RLL mass.  Upon review with pulmonary (Dr. Halford Chessman), it was decided not to biopsy the lesion due to no plans for surgery, chemo or xrt for the mass.  The pneumonia was treated initially with vanc and zosyn and then she was placed on levaquin and returned to [place] on hospice care via [facility].   She was sent out to the hospital after a right hip fracture on 05/25/14 and had a right hemiarthroplasty and was discharged back to rehab on 05/28/14 with PT and OT.   She was sent to the ER due to increased hemoptysis on 06/07/14 and was sent back with orders to D/C ASA. A CT of her chest was obtained which revealed multiple small emboli on both sides but no right heart strain or large embolus.   Update 06/17/14 I was asked to see her due to seizure activity occuring on the right side of her body with twitching,  lasting 2-3 minutes. She was noted to have right sided weakness on 06/15/14, there was concern for a CVA or brain mets but this was not worked up due to her wishes/age/debility.  She is alert now and having more hemoptysis, as well as rattling when she breathes.   ROS: Review of Systems  Constitutional: Positive for weight loss and malaise/fatigue. Negative for fever and chills.  HENT:       Hemoptysis  Respiratory: Positive for cough, hemoptysis, sputum production and shortness of breath.   Cardiovascular: Negative for chest pain.  Skin: Positive for itching.  Neurological: Positive for focal weakness, seizures and weakness. Negative for dizziness and loss of consciousness.     Past Medical History  Diagnosis Date  . Cholelithiases   . Hypothyroid   . Leg paresthesia   . Aortic valve stenosis   . Arthritis   . Hyperlipidemia   . Dementia   . Carotid artery occlusion   . Renal disorder   . Hypertension   . COPD (chronic obstructive pulmonary disease)   . SEIZURE DISORDER 11/05/2006  . GERD 11/05/2006  . CVA 02/07/2007  . Macular degeneration of both eyes   . Blind in both eyes   . Peripheral neuropathy   . Pneumonia     "many times"  . Internal  hemorrhoid, bleeding   . Malaria     "as a child"  . Skin cancer     "nose, face mostly"  . Chronic lower back pain     "when I go to the bathroom"  . Intestinal infection due to Clostridium difficile 09/2011  . Benign neoplasm of breast   . Gout, unspecified   . Anemia   . Anxiety   . Other dysfunctions of sleep stages or arousal from sleep   . Depression   . Chronic rhinitis   . Abscess of lung(513.0)     RUL  . Diaphragmatic hernia without mention of obstruction or gangrene   . Diverticulosis of colon (without mention of hemorrhage)   . Acute duodenal ulcer with perforation and obstruction 08/2011    perforation  . Hemorrhage of rectum and anus   . Abscess of intestine 09/2011    drainage  . Other psoriasis   . Lumbago    . Nonspecific elevation of levels of transaminase or lactic acid dehydrogenase (LDH)   . Solitary pulmonary nodule     RLL nodule 1 cm  . Transient ischemic attack (TIA), and cerebral infarction without residual deficits(V12.54)   . Long term (current) use of anticoagulants   . Sacroiliitis 08/27/2012  . Anemia, iron deficiency 10/16/2011  . Congestive heart failure   . Spondylosis of lumbosacral region 09/24/2012    L/S xray 08/2012: Mild to moderate osteoarthritis and degenerative spondylosis he diffusely. Anterior subluxation of L4 relative to L5 at 5.7 cm. Mild to moderate levoconvex curvature the mid lumbar spine    . Dysrhythmia     hx atrial fib  . Arteritis     hx giant cell arteritis  . Aortic stenosis, severe 03/02/2013    Followed since 2004 with mild aortic valve stenosis and mild regurgitation. Echocardiograms 2006-2013 show  progression from mild to moderate aortic stenosis, mild insufficiency. Mild to moderate mitral valve regurgitation evident in 2013.   07/25/11: Worsening valve disease by echocardiogram, pt is not interested in invasive treatment. Symptoms appear stable for now. Continue to monitor      Past Surgical History  Procedure Laterality Date  . Vesicovaginal fistula closure w/ tah    . Tonsillectomy    . Breast lumpectomy      bilaterally  . Cataract extraction w/ intraocular lens  implant, bilateral    . Skin cancer excision      "nose and face"  . Abdominal hysterectomy    . Hemorrhoid surgery  11/2008    banding  . Hip surgery Right 05/26/2014  . Hip arthroplasty Right 05/26/2014    Procedure: ARTHROPLASTY BIPOLAR HIP;  Surgeon: Mauri Pole, MD;  Location: [facility];  Service: Orthopedics;  Laterality: Right;   Social History:   reports that she quit smoking about 20 years ago. Her smoking use included Cigarettes. She has a 50 pack-year smoking history. She has never used smokeless tobacco. She reports that she drinks alcohol. She reports that she does not use  illicit drugs.  Family History  Problem Relation Age of Onset  . Hyperlipidemia    . Hypertension    . Stroke    . Colon cancer Daughter   . Heart attack Father   . Kidney failure Father   . Heart disease Son     Medications: Patient's Medications  New Prescriptions   No medications on file  Previous Medications   ACETAMINOPHEN (TYLENOL) 325 MG TABLET    Take 650 mg by mouth every  6 (six) hours as needed for mild pain.    ACYCLOVIR OINTMENT (ZOVIRAX) 5 %    Apply 1 application topically every 3 (three) hours as needed. For cold sores   ALBUTEROL (PROVENTIL HFA;VENTOLIN HFA) 108 (90 BASE) MCG/ACT INHALER    Inhale 2 puffs into the lungs every 6 (six) hours as needed for wheezing or shortness of breath.   ALBUTEROL (PROVENTIL) (2.5 MG/3ML) 0.083% NEBULIZER SOLUTION    Take 3 mLs (2.5 mg total) by nebulization every 2 (two) hours as needed for wheezing.   ALUM & MAG HYDROXIDE-SIMETH (MAGIC MOUTHWASH W/LIDOCAINE) SOLN    Take 5 mLs by mouth 4 (four) times daily as needed for mouth pain.   AMOXICILLIN (AMOXIL) 500 MG CAPSULE    Take 2,000 mg by mouth once. Prior to dental procedure   ARTIFICIAL SALIVA (BIOTENE MOISTURIZING MOUTH) SOLN    Use as directed 2 sprays in the mouth or throat every 6 (six) hours as needed (for dry mouth).   AUGMENTED BETAMETHASONE DIPROPIONATE (DIPROLENE-AF) 0.05 % OINTMENT    Apply 1 application topically 2 (two) times daily as needed (for itching).    BENZONATATE (TESSALON) 200 MG CAPSULE    Take 1 capsule (200 mg total) by mouth 3 (three) times daily as needed for cough.   CETIRIZINE (ZYRTEC) 10 MG TABLET    Take 10 mg by mouth daily.   CHLORHEXIDINE (PERIDEX) 0.12 % SOLUTION    Use as directed 15 mLs in the mouth or throat. swish 15 mls for 30 sec then spit, twice daily   CHLORPHENIRAMINE-HYDROCODONE (TUSSIONEX) 10-8 MG/5ML LQCR    Take 5 mLs by mouth every 12 (twelve) hours as needed for cough.   COAL TAR (NEUTROGENA T-GEL) 0.5 % SHAMPOO    Apply 1 application  topically See admin instructions. She uses on Tuesday and Friday.   DIPHENHYDRAMINE-ZINC ACETATE (BENADRYL) CREAM    Apply topically 3 (three) times daily as needed for itching.   DONEPEZIL (ARICEPT) 10 MG TABLET    Take 10 mg by mouth at bedtime.    EMOLLIENT (CERAVE) CREA    Apply 1 application topically daily. Applies to dry skin.   ESCITALOPRAM (LEXAPRO) 20 MG TABLET    Take 20 mg by mouth every morning.    FEEDING SUPPLEMENT, RESOURCE BREEZE, (RESOURCE BREEZE) LIQD    Take 1 Container by mouth 3 (three) times daily between meals.   FERROUS SULFATE 325 (65 FE) MG TABLET    Take 1 tablet (325 mg total) by mouth 3 (three) times daily after meals.   FLUTICASONE (FLONASE) 50 MCG/ACT NASAL SPRAY    Place 2 sprays into both nostrils daily.   GABAPENTIN (NEURONTIN) 100 MG CAPSULE    Take 200 mg by mouth at bedtime.    HYDROCODONE-ACETAMINOPHEN (NORCO) 5-325 MG PER TABLET    Take 1-2 tablets by mouth every 4 (four) hours as needed for moderate pain.   HYDROXYZINE (ATARAX/VISTARIL) 25 MG TABLET    Take 25 mg by mouth at bedtime.   LEVOTHYROXINE (SYNTHROID, LEVOTHROID) 88 MCG TABLET    Take 88 mcg by mouth daily before breakfast.    LIDOCAINE (LIDODERM) 5 %    Place 1 patch onto the skin daily. Remove & Discard patch within 12 hours or as directed by MD   LORAZEPAM (ATIVAN) 2 MG/ML CONCENTRATED SOLUTION    Take 0.5 mLs (1 mg total) by mouth every 6 (six) hours as needed for anxiety or sedation.   MORPHINE SULFATE (MORPHINE CONCENTRATE) 10 MG /  0.5 ML CONCENTRATED SOLUTION    Take 0.25 mLs (5 mg total) by mouth every 4 (four) hours as needed for severe pain, anxiety or shortness of breath.   MULTIPLE VITAMINS-MINERALS (PRESERVISION AREDS) CAPS    Take 1 capsule by mouth 2 (two) times daily with a meal.    NYSTATIN (MYCOSTATIN/NYSTOP) 100000 UNIT/GM POWD    Apply topically 2 (two) times daily.   OLOPATADINE (PATANOL) 0.1 % OPHTHALMIC SOLUTION    Place 1 drop into both eyes 2 (two) times daily as needed. For  dry eyes   POLYETHYLENE GLYCOL (MIRALAX / GLYCOLAX) PACKET    Take 17 g by mouth every morning.    PRAMOXINE-MINERAL OIL-ZINC (TUCKS) 1-12.5 % RECTAL OINTMENT    Place 1 application rectally every 2 (two) hours as needed for itching (itching).   PRAVASTATIN (PRAVACHOL) 40 MG TABLET    Take 1 tablet (40 mg total) by mouth every evening.   PROPYLENE GLYCOL (SYSTANE BALANCE) 0.6 % SOLN    Place 1 drop into both eyes daily as needed (For dry or itchy eyes.). One drop both eye as needed   SENNA-DOCUSATE (SENOKOT S) 8.6-50 MG PER TABLET    Take 1-2 tablets by mouth See admin instructions. She takes one tablet in the morning and two tablets at bedtime.  Modified Medications   No medications on file  Discontinued Medications   No medications on file     Physical Exam: Filed Vitals:   06/17/14 0947  BP: 184/110  Pulse: 86  Temp: 97 F (36.1 C)  Resp: 20   Physical Exam  Constitutional: She is oriented to person, place, and time. No distress.  HENT:  Head: Normocephalic and atraumatic.  Eyes: Conjunctivae and EOM are normal. Pupils are equal, round, and reactive to light.  Cardiovascular: Normal rate, regular rhythm and intact distal pulses.   Murmur heard. +1 edema to RLE  Pulmonary/Chest: She has wheezes.  Increased effort; diminished breath sounds throughout with wheezing on the right  Abdominal: Soft. Bowel sounds are normal. She exhibits no distension. There is no tenderness.  Neurological: She is alert and oriented to person, place, and time.  No facial cranial nerve deficit noted but significant right sided weakness noted to RUE and RLE. Slightly muffled speech  Skin: She is not diaphoretic.  Right groin area with small mass with surrounding erythema.  Small mass noted to RLQ as well with no tenderness  Psychiatric: She has a normal mood and affect.     Labs reviewed: Basic Metabolic Panel:  Recent Labs  11/04/13 2030  05/26/14 0900 05/27/14 0525 05/28/14 0602  06/07/14 2153  NA  --   < > 139 136 136 138  K  --   < > 3.0* 3.5 4.3 4.0  CL  --   < > 103 101 103 106  CO2  --   < > 32 27 25 24   GLUCOSE  --   < > 105* 91 107* 93  BUN  --   < > 12 11 9 10   CREATININE  --   < > 1.25* 1.07 1.17* 1.14*  CALCIUM  --   < > 8.6 8.3* 8.3* 8.8  MG 1.8  --  2.1  --   --   --   < > = values in this interval not displayed. Liver Function Tests:  Recent Labs  11/04/13 1548 06/07/14 2153  AST 25 17  ALT 6 8  ALKPHOS 89 83  BILITOT 0.3 0.5  PROT  7.5 6.5  ALBUMIN 3.2* 2.5*   No results for input(s): LIPASE, AMYLASE in the last 8760 hours. No results for input(s): AMMONIA in the last 8760 hours. CBC:  Recent Labs  11/04/13 1548  01/25/14 1502  05/27/14 0525 05/28/14 0602 06/07/14 2153  WBC 6.7  < > 7.7  < > 8.4 9.1 12.9*  NEUTROABS 3.6  --  5.2  --   --   --  8.6*  HGB 8.6*  < > 11.6*  < > 8.6* 8.2* 9.6*  HCT 29.6*  < > 37.0  < > 28.2* 26.9* 32.0*  MCV 74.2*  < > 89.4  < > 80.6 80.8 87.2  PLT 343  < > 258  < > 304 296 442*  < > = values in this interval not displayed. Cardiac Enzymes: No results for input(s): CKTOTAL, CKMB, CKMBINDEX, TROPONINI in the last 8760 hours. BNP: Invalid input(s): POCBNP CBG: No results for input(s): GLUCAP in the last 8760 hours.  Imaging and Procedures: 05/25/14 Right hip and pelvis:  1. There is an acute impacted slightly angulated subcapital fracture of the right hip. 2. There is no acute fracture of the bony pelvis. 3. These results will be called to the ordering clinician or representative by the Radiologist Assistant, and communication documented in the PACS or zVision Dashboard.  05/25/14:  CXR portable 1. RIGHT infrahilar and perihilar airspace disease may represent asymmetric pulmonary edema, aspiration or pneumonia. 2. Moderate hiatal hernia with fluid level in the LEFT chest.  05/26/14:  Portable pelvis xray:   RIGHT hip prosthesis without acute complication.  06/08/14 CT of the chest IMPRESSION: 1.  Multiple small pulmonary emboli involving both lungs. No large central emboli. No evidence of right heart strain. 2. Extensive new consolidation in the right lower lobe posteriorly, partially obscuring the known right lower lobe mass. 3. Small new right pleural effusion. 4. Large hiatal hernia  Assessment/Plan  1. Right sided weakness Most likely secondary to a CVA, also consider brain mets. She is at risk since she is not on coumadin. She is not able to tolerate this due to hemoptysis, frailty, and overall goals of care. We agreed to maintain comfort measures only.    2. Hemoptysis Continue to have this, no change. H/H stable continue ferrous sulfate. May have atropine drops for excess secretions.  3. Chronic atrial fibrillation Rate controlled without meds. No anticoagulation due to the above. Continue to monitor.   4. Lung mass Followed by Dr. Halford Chessman. Goals of care are comfort. Continue oxygen. Continue prn albuterol. Spiriva discontinued per her request previously.  5. Seizures Ativan 1mg  q2 hrs prn seizures, consider Keppra  D/C Aricept, pravachol, vitamin  Cindi Carbon, ANP [facility] [phone]

## 2014-07-12 ENCOUNTER — Ambulatory Visit: Payer: Medicare Other | Admitting: Cardiology

## 2014-07-22 ENCOUNTER — Ambulatory Visit: Payer: Medicare Other | Admitting: Pulmonary Disease

## 2014-07-25 DEATH — deceased

## 2014-11-12 NOTE — Progress Notes (Signed)
This encounter was created in error - please disregard.

## 2015-12-02 IMAGING — CR DG CHEST 1V PORT
1 series · 1 of 1 positions shown · non-contrast
Comparison: 05/25/2014

CLINICAL DATA: Initial evaluation hemoptysis, history of lung
cancer and COPD

EXAM:
PORTABLE CHEST - 1 VIEW

[AP]
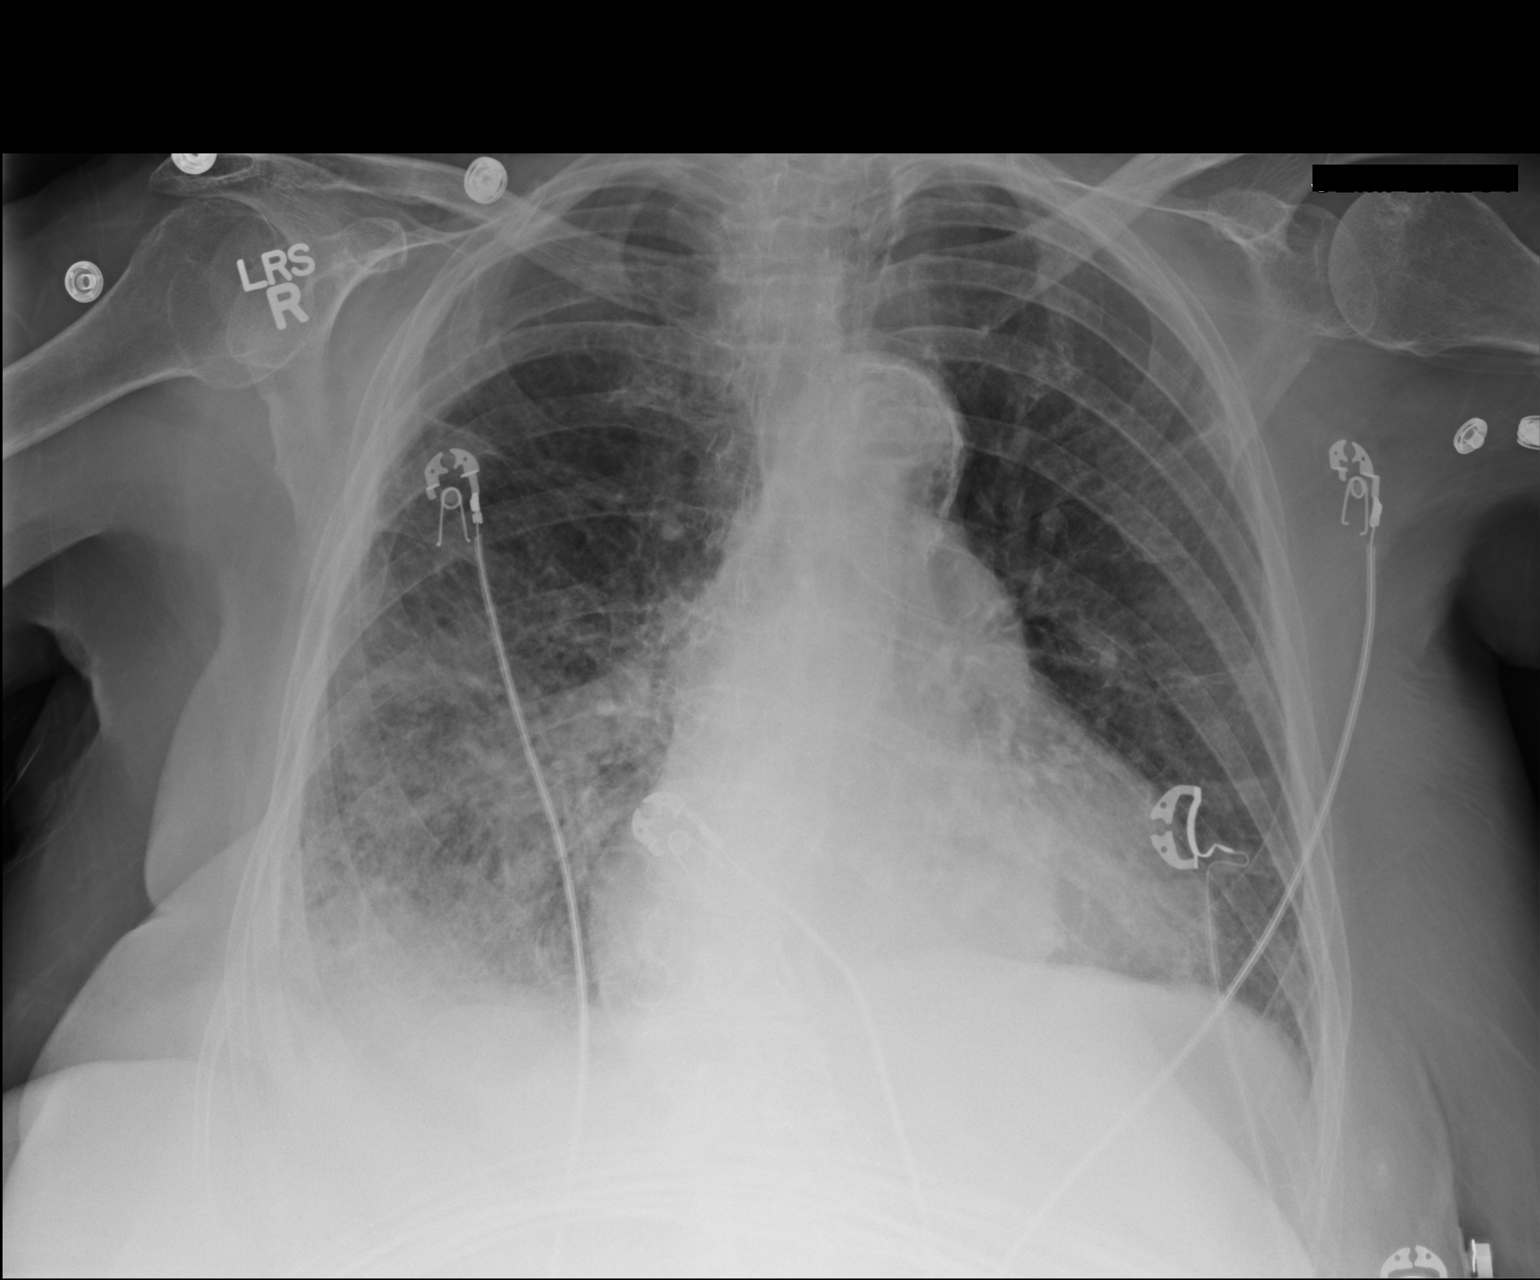

[1 of 1 positions shown; findings below may reference images not displayed]

FINDINGS: Mild to moderate cardiac enlargement stable. Vascular pattern
normal. Calcification of the aortic arch unchanged. Left lung shows
no significant opacities other than mild atelectasis the lower lobe.

There is infiltrate throughout the right mid to lower lung zone.
This is new from the prior study. Moderate hiatal hernia noted
again.
IMPRESSION: Fairly extensive infiltrate right mid to lower lung zone
representing a change from prior studies. Pulmonary hemorrhage or
pneumonia could both causes appearance.
